# Patient Record
Sex: Female | Born: 1935 | Race: Black or African American | Hispanic: No | Smoking: Former smoker
Health system: Southern US, Community
[De-identification: ages and names within clinical notes are randomized; demographics above are authoritative.]

## PROBLEM LIST (undated history)

## (undated) DIAGNOSIS — I10 Essential (primary) hypertension: Secondary | ICD-10-CM

## (undated) DIAGNOSIS — E119 Type 2 diabetes mellitus without complications: Secondary | ICD-10-CM

## (undated) DIAGNOSIS — C50911 Malignant neoplasm of unspecified site of right female breast: Secondary | ICD-10-CM

## (undated) DIAGNOSIS — Z992 Dependence on renal dialysis: Secondary | ICD-10-CM

## (undated) DIAGNOSIS — E785 Hyperlipidemia, unspecified: Secondary | ICD-10-CM

## (undated) DIAGNOSIS — C50912 Malignant neoplasm of unspecified site of left female breast: Secondary | ICD-10-CM

## (undated) DIAGNOSIS — M1990 Unspecified osteoarthritis, unspecified site: Secondary | ICD-10-CM

## (undated) DIAGNOSIS — I999 Unspecified disorder of circulatory system: Secondary | ICD-10-CM

## (undated) DIAGNOSIS — D649 Anemia, unspecified: Secondary | ICD-10-CM

## (undated) DIAGNOSIS — B1920 Unspecified viral hepatitis C without hepatic coma: Secondary | ICD-10-CM

## (undated) DIAGNOSIS — N186 End stage renal disease: Secondary | ICD-10-CM

## (undated) DIAGNOSIS — I739 Peripheral vascular disease, unspecified: Secondary | ICD-10-CM

## (undated) HISTORY — PX: EVACUATION BREAST HEMATOMA: SHX1537

## (undated) HISTORY — PX: APPENDECTOMY: SHX54

## (undated) HISTORY — PX: MASTECTOMY: SHX3

## (undated) HISTORY — PX: UTERINE FIBROID SURGERY: SHX826

## (undated) HISTORY — PX: POSTERIOR FUSION CERVICAL SPINE: SUR628

## (undated) HISTORY — PX: DG AV DIALYSIS GRAFT DECLOT OR: HXRAD813

## (undated) HISTORY — DX: Unspecified disorder of circulatory system: I99.9

## (undated) HISTORY — PX: BREAST BIOPSY: SHX20

## (undated) HISTORY — PX: MEDIAN NERVE REPAIR: SHX2024

## (undated) HISTORY — PX: TONSILLECTOMY: SUR1361

## (undated) HISTORY — PX: ABDOMINAL HYSTERECTOMY: SHX81

## (undated) HISTORY — PX: CATARACT EXTRACTION: SUR2

## (undated) HISTORY — PX: BACK SURGERY: SHX140

## (undated) HISTORY — PX: TOE AMPUTATION: SHX809

---

## 1997-08-06 ENCOUNTER — Ambulatory Visit: Admission: RE | Admit: 1997-08-06 | Discharge: 1997-08-06 | Payer: Self-pay | Admitting: General Surgery

## 1997-08-20 ENCOUNTER — Encounter: Admission: RE | Admit: 1997-08-20 | Discharge: 1997-11-18 | Payer: Self-pay | Admitting: Internal Medicine

## 1997-11-26 ENCOUNTER — Encounter: Admission: RE | Admit: 1997-11-26 | Discharge: 1998-02-24 | Payer: Self-pay | Admitting: Internal Medicine

## 1998-03-04 ENCOUNTER — Encounter: Admission: RE | Admit: 1998-03-04 | Discharge: 1998-03-28 | Payer: Self-pay | Admitting: Internal Medicine

## 1998-04-07 ENCOUNTER — Encounter: Admission: RE | Admit: 1998-04-07 | Discharge: 1998-05-15 | Payer: Self-pay | Admitting: Internal Medicine

## 1998-08-13 ENCOUNTER — Encounter: Admission: RE | Admit: 1998-08-13 | Discharge: 1998-08-14 | Payer: Self-pay | Admitting: Internal Medicine

## 1998-11-07 ENCOUNTER — Encounter: Admission: RE | Admit: 1998-11-07 | Discharge: 1999-02-05 | Payer: Self-pay | Admitting: Internal Medicine

## 1999-02-10 ENCOUNTER — Encounter: Admission: RE | Admit: 1999-02-10 | Discharge: 1999-05-11 | Payer: Self-pay | Admitting: Internal Medicine

## 1999-05-12 ENCOUNTER — Encounter: Admission: RE | Admit: 1999-05-12 | Discharge: 1999-08-10 | Payer: Self-pay | Admitting: Internal Medicine

## 1999-08-18 ENCOUNTER — Encounter: Admission: RE | Admit: 1999-08-18 | Discharge: 1999-11-16 | Payer: Self-pay | Admitting: Internal Medicine

## 1999-11-25 ENCOUNTER — Encounter: Admission: RE | Admit: 1999-11-25 | Discharge: 2000-02-11 | Payer: Self-pay

## 2000-04-13 ENCOUNTER — Encounter: Admission: RE | Admit: 2000-04-13 | Discharge: 2000-07-12 | Payer: Self-pay | Admitting: Internal Medicine

## 2000-07-13 ENCOUNTER — Encounter: Admission: RE | Admit: 2000-07-13 | Discharge: 2000-10-04 | Payer: Self-pay | Admitting: Internal Medicine

## 2000-10-06 ENCOUNTER — Encounter: Admission: RE | Admit: 2000-10-06 | Discharge: 2001-01-02 | Payer: Self-pay | Admitting: Internal Medicine

## 2000-11-13 ENCOUNTER — Emergency Department (HOSPITAL_COMMUNITY): Admission: EM | Admit: 2000-11-13 | Discharge: 2000-11-13 | Payer: Self-pay | Admitting: Emergency Medicine

## 2001-02-01 ENCOUNTER — Encounter: Admission: RE | Admit: 2001-02-01 | Discharge: 2001-04-24 | Payer: Self-pay | Admitting: Internal Medicine

## 2001-05-13 ENCOUNTER — Encounter: Admission: RE | Admit: 2001-05-13 | Discharge: 2001-06-09 | Payer: Self-pay | Admitting: *Deleted

## 2001-06-14 ENCOUNTER — Encounter (HOSPITAL_BASED_OUTPATIENT_CLINIC_OR_DEPARTMENT_OTHER): Admission: RE | Admit: 2001-06-14 | Discharge: 2001-08-18 | Payer: Self-pay | Admitting: Internal Medicine

## 2001-09-18 ENCOUNTER — Encounter (HOSPITAL_BASED_OUTPATIENT_CLINIC_OR_DEPARTMENT_OTHER): Admission: RE | Admit: 2001-09-18 | Discharge: 2001-10-27 | Payer: Self-pay | Admitting: Internal Medicine

## 2001-12-13 ENCOUNTER — Encounter (HOSPITAL_BASED_OUTPATIENT_CLINIC_OR_DEPARTMENT_OTHER): Admission: RE | Admit: 2001-12-13 | Discharge: 2002-03-21 | Payer: Self-pay | Admitting: Internal Medicine

## 2002-04-18 ENCOUNTER — Encounter (HOSPITAL_BASED_OUTPATIENT_CLINIC_OR_DEPARTMENT_OTHER): Admission: RE | Admit: 2002-04-18 | Discharge: 2002-07-17 | Payer: Self-pay | Admitting: Internal Medicine

## 2002-07-17 ENCOUNTER — Ambulatory Visit (HOSPITAL_COMMUNITY): Admission: RE | Admit: 2002-07-17 | Discharge: 2002-07-17 | Payer: Self-pay | Admitting: Endocrinology

## 2002-07-17 ENCOUNTER — Encounter: Payer: Self-pay | Admitting: Endocrinology

## 2002-07-23 ENCOUNTER — Encounter: Payer: Self-pay | Admitting: Endocrinology

## 2002-07-23 ENCOUNTER — Encounter: Admission: RE | Admit: 2002-07-23 | Discharge: 2002-07-23 | Payer: Self-pay | Admitting: Endocrinology

## 2002-08-09 ENCOUNTER — Encounter (HOSPITAL_BASED_OUTPATIENT_CLINIC_OR_DEPARTMENT_OTHER): Admission: RE | Admit: 2002-08-09 | Discharge: 2002-10-17 | Payer: Self-pay | Admitting: Internal Medicine

## 2002-08-16 ENCOUNTER — Encounter: Payer: Self-pay | Admitting: Nephrology

## 2002-08-16 ENCOUNTER — Encounter: Admission: RE | Admit: 2002-08-16 | Discharge: 2002-08-16 | Payer: Self-pay | Admitting: Nephrology

## 2002-10-25 ENCOUNTER — Encounter (HOSPITAL_COMMUNITY): Admission: RE | Admit: 2002-10-25 | Discharge: 2003-01-23 | Payer: Self-pay | Admitting: Nephrology

## 2003-01-17 ENCOUNTER — Encounter (HOSPITAL_BASED_OUTPATIENT_CLINIC_OR_DEPARTMENT_OTHER): Admission: RE | Admit: 2003-01-17 | Discharge: 2003-04-17 | Payer: Self-pay | Admitting: Internal Medicine

## 2003-02-08 ENCOUNTER — Encounter (HOSPITAL_COMMUNITY): Admission: RE | Admit: 2003-02-08 | Discharge: 2003-05-09 | Payer: Self-pay | Admitting: Nephrology

## 2003-03-18 ENCOUNTER — Ambulatory Visit (HOSPITAL_COMMUNITY): Admission: RE | Admit: 2003-03-18 | Discharge: 2003-03-18 | Payer: Self-pay | Admitting: Internal Medicine

## 2003-03-27 ENCOUNTER — Ambulatory Visit (HOSPITAL_COMMUNITY): Admission: RE | Admit: 2003-03-27 | Discharge: 2003-03-27 | Payer: Self-pay | Admitting: Internal Medicine

## 2003-03-27 ENCOUNTER — Encounter (INDEPENDENT_AMBULATORY_CARE_PROVIDER_SITE_OTHER): Payer: Self-pay | Admitting: Specialist

## 2003-04-08 ENCOUNTER — Ambulatory Visit (HOSPITAL_COMMUNITY): Admission: RE | Admit: 2003-04-08 | Discharge: 2003-04-08 | Payer: Self-pay | Admitting: Vascular Surgery

## 2003-04-16 ENCOUNTER — Encounter (HOSPITAL_BASED_OUTPATIENT_CLINIC_OR_DEPARTMENT_OTHER): Admission: RE | Admit: 2003-04-16 | Discharge: 2003-07-15 | Payer: Self-pay | Admitting: Internal Medicine

## 2003-05-20 ENCOUNTER — Ambulatory Visit (HOSPITAL_COMMUNITY): Admission: RE | Admit: 2003-05-20 | Discharge: 2003-05-20 | Payer: Self-pay | Admitting: Vascular Surgery

## 2003-08-01 ENCOUNTER — Ambulatory Visit (HOSPITAL_COMMUNITY): Admission: RE | Admit: 2003-08-01 | Discharge: 2003-08-02 | Payer: Self-pay | Admitting: Ophthalmology

## 2003-08-14 ENCOUNTER — Encounter (HOSPITAL_BASED_OUTPATIENT_CLINIC_OR_DEPARTMENT_OTHER): Admission: RE | Admit: 2003-08-14 | Discharge: 2003-10-11 | Payer: Self-pay | Admitting: Internal Medicine

## 2003-11-07 ENCOUNTER — Encounter (HOSPITAL_BASED_OUTPATIENT_CLINIC_OR_DEPARTMENT_OTHER): Admission: RE | Admit: 2003-11-07 | Discharge: 2003-12-03 | Payer: Self-pay | Admitting: Internal Medicine

## 2004-01-08 ENCOUNTER — Encounter (HOSPITAL_BASED_OUTPATIENT_CLINIC_OR_DEPARTMENT_OTHER): Admission: RE | Admit: 2004-01-08 | Discharge: 2004-01-17 | Payer: Self-pay | Admitting: Internal Medicine

## 2004-03-19 ENCOUNTER — Ambulatory Visit (HOSPITAL_COMMUNITY): Admission: RE | Admit: 2004-03-19 | Discharge: 2004-03-19 | Payer: Self-pay | Admitting: Nephrology

## 2004-04-14 ENCOUNTER — Encounter (HOSPITAL_BASED_OUTPATIENT_CLINIC_OR_DEPARTMENT_OTHER): Admission: RE | Admit: 2004-04-14 | Discharge: 2004-05-29 | Payer: Self-pay | Admitting: Internal Medicine

## 2004-04-19 ENCOUNTER — Emergency Department (HOSPITAL_COMMUNITY): Admission: EM | Admit: 2004-04-19 | Discharge: 2004-04-19 | Payer: Self-pay | Admitting: Emergency Medicine

## 2004-04-30 ENCOUNTER — Emergency Department (HOSPITAL_COMMUNITY): Admission: EM | Admit: 2004-04-30 | Discharge: 2004-04-30 | Payer: Self-pay | Admitting: Emergency Medicine

## 2004-06-12 ENCOUNTER — Encounter (HOSPITAL_BASED_OUTPATIENT_CLINIC_OR_DEPARTMENT_OTHER): Admission: RE | Admit: 2004-06-12 | Discharge: 2004-09-10 | Payer: Self-pay | Admitting: Surgery

## 2004-08-03 ENCOUNTER — Observation Stay (HOSPITAL_COMMUNITY): Admission: EM | Admit: 2004-08-03 | Discharge: 2004-08-03 | Payer: Self-pay | Admitting: Emergency Medicine

## 2004-10-22 ENCOUNTER — Emergency Department (HOSPITAL_COMMUNITY): Admission: EM | Admit: 2004-10-22 | Discharge: 2004-10-22 | Payer: Self-pay | Admitting: Emergency Medicine

## 2005-02-18 ENCOUNTER — Ambulatory Visit (HOSPITAL_COMMUNITY): Admission: RE | Admit: 2005-02-18 | Discharge: 2005-02-18 | Payer: Self-pay | Admitting: Nephrology

## 2005-05-14 ENCOUNTER — Ambulatory Visit (HOSPITAL_COMMUNITY): Admission: RE | Admit: 2005-05-14 | Discharge: 2005-05-14 | Payer: Self-pay | Admitting: Vascular Surgery

## 2005-08-24 ENCOUNTER — Ambulatory Visit (HOSPITAL_COMMUNITY): Admission: RE | Admit: 2005-08-24 | Discharge: 2005-08-24 | Payer: Self-pay | Admitting: Nephrology

## 2005-10-13 ENCOUNTER — Emergency Department (HOSPITAL_COMMUNITY): Admission: EM | Admit: 2005-10-13 | Discharge: 2005-10-13 | Payer: Self-pay | Admitting: Emergency Medicine

## 2005-10-20 ENCOUNTER — Emergency Department (HOSPITAL_COMMUNITY): Admission: EM | Admit: 2005-10-20 | Discharge: 2005-10-20 | Payer: Self-pay | Admitting: Emergency Medicine

## 2005-10-21 ENCOUNTER — Encounter: Admission: RE | Admit: 2005-10-21 | Discharge: 2005-10-21 | Payer: Self-pay | Admitting: Endocrinology

## 2005-11-30 ENCOUNTER — Ambulatory Visit (HOSPITAL_COMMUNITY): Admission: RE | Admit: 2005-11-30 | Discharge: 2005-11-30 | Payer: Self-pay | Admitting: Vascular Surgery

## 2005-12-03 ENCOUNTER — Ambulatory Visit (HOSPITAL_COMMUNITY): Admission: RE | Admit: 2005-12-03 | Discharge: 2005-12-03 | Payer: Self-pay | Admitting: *Deleted

## 2006-01-13 ENCOUNTER — Ambulatory Visit (HOSPITAL_COMMUNITY): Admission: RE | Admit: 2006-01-13 | Discharge: 2006-01-13 | Payer: Self-pay | Admitting: Vascular Surgery

## 2006-02-22 ENCOUNTER — Emergency Department (HOSPITAL_COMMUNITY): Admission: EM | Admit: 2006-02-22 | Discharge: 2006-02-22 | Payer: Self-pay | Admitting: Emergency Medicine

## 2006-03-10 ENCOUNTER — Ambulatory Visit (HOSPITAL_COMMUNITY): Admission: RE | Admit: 2006-03-10 | Discharge: 2006-03-10 | Payer: Self-pay | Admitting: Vascular Surgery

## 2006-03-19 ENCOUNTER — Emergency Department (HOSPITAL_COMMUNITY): Admission: EM | Admit: 2006-03-19 | Discharge: 2006-03-19 | Payer: Self-pay | Admitting: Emergency Medicine

## 2006-03-21 ENCOUNTER — Encounter (INDEPENDENT_AMBULATORY_CARE_PROVIDER_SITE_OTHER): Payer: Self-pay | Admitting: Specialist

## 2006-03-21 ENCOUNTER — Ambulatory Visit (HOSPITAL_COMMUNITY): Admission: RE | Admit: 2006-03-21 | Discharge: 2006-03-21 | Payer: Self-pay | Admitting: *Deleted

## 2006-03-31 ENCOUNTER — Encounter: Admission: RE | Admit: 2006-03-31 | Discharge: 2006-03-31 | Payer: Self-pay | Admitting: Endocrinology

## 2006-04-07 ENCOUNTER — Inpatient Hospital Stay (HOSPITAL_COMMUNITY): Admission: AD | Admit: 2006-04-07 | Discharge: 2006-04-14 | Payer: Self-pay | Admitting: Neurosurgery

## 2006-04-14 ENCOUNTER — Inpatient Hospital Stay (HOSPITAL_COMMUNITY)
Admission: RE | Admit: 2006-04-14 | Discharge: 2006-05-06 | Payer: Self-pay | Admitting: Physical Medicine & Rehabilitation

## 2006-04-16 ENCOUNTER — Ambulatory Visit: Payer: Self-pay | Admitting: Physical Medicine & Rehabilitation

## 2006-07-26 ENCOUNTER — Ambulatory Visit (HOSPITAL_COMMUNITY): Admission: RE | Admit: 2006-07-26 | Discharge: 2006-07-26 | Payer: Self-pay | Admitting: Nephrology

## 2006-08-03 ENCOUNTER — Emergency Department (HOSPITAL_COMMUNITY): Admission: EM | Admit: 2006-08-03 | Discharge: 2006-08-03 | Payer: Self-pay | Admitting: Emergency Medicine

## 2006-09-27 ENCOUNTER — Ambulatory Visit (HOSPITAL_COMMUNITY): Admission: RE | Admit: 2006-09-27 | Discharge: 2006-09-27 | Payer: Self-pay | Admitting: Neurosurgery

## 2006-11-08 ENCOUNTER — Ambulatory Visit (HOSPITAL_COMMUNITY): Admission: RE | Admit: 2006-11-08 | Discharge: 2006-11-08 | Payer: Self-pay | Admitting: Nephrology

## 2006-12-26 ENCOUNTER — Ambulatory Visit (HOSPITAL_COMMUNITY): Admission: EM | Admit: 2006-12-26 | Discharge: 2006-12-26 | Payer: Self-pay | Admitting: Emergency Medicine

## 2007-01-16 ENCOUNTER — Ambulatory Visit (HOSPITAL_COMMUNITY): Admission: RE | Admit: 2007-01-16 | Discharge: 2007-01-16 | Payer: Self-pay | Admitting: Nephrology

## 2007-01-30 ENCOUNTER — Ambulatory Visit (HOSPITAL_COMMUNITY): Admission: RE | Admit: 2007-01-30 | Discharge: 2007-01-30 | Payer: Self-pay | Admitting: *Deleted

## 2007-01-30 ENCOUNTER — Ambulatory Visit: Payer: Self-pay | Admitting: Vascular Surgery

## 2007-02-13 ENCOUNTER — Ambulatory Visit (HOSPITAL_COMMUNITY): Admission: RE | Admit: 2007-02-13 | Discharge: 2007-02-13 | Payer: Self-pay | Admitting: Vascular Surgery

## 2007-02-17 ENCOUNTER — Encounter: Admission: RE | Admit: 2007-02-17 | Discharge: 2007-02-17 | Payer: Self-pay | Admitting: Gastroenterology

## 2007-02-20 ENCOUNTER — Ambulatory Visit (HOSPITAL_COMMUNITY): Admission: RE | Admit: 2007-02-20 | Discharge: 2007-02-20 | Payer: Self-pay | Admitting: *Deleted

## 2007-02-21 ENCOUNTER — Inpatient Hospital Stay (HOSPITAL_COMMUNITY): Admission: AD | Admit: 2007-02-21 | Discharge: 2007-02-24 | Payer: Self-pay | Admitting: Gastroenterology

## 2007-03-21 ENCOUNTER — Ambulatory Visit (HOSPITAL_COMMUNITY): Admission: RE | Admit: 2007-03-21 | Discharge: 2007-03-21 | Payer: Self-pay | Admitting: *Deleted

## 2007-04-13 ENCOUNTER — Ambulatory Visit: Payer: Self-pay | Admitting: *Deleted

## 2007-04-18 ENCOUNTER — Ambulatory Visit: Payer: Self-pay | Admitting: Vascular Surgery

## 2007-04-18 ENCOUNTER — Ambulatory Visit (HOSPITAL_COMMUNITY): Admission: RE | Admit: 2007-04-18 | Discharge: 2007-04-18 | Payer: Self-pay | Admitting: Surgery

## 2007-05-25 ENCOUNTER — Ambulatory Visit (HOSPITAL_COMMUNITY): Admission: RE | Admit: 2007-05-25 | Discharge: 2007-05-25 | Payer: Self-pay | Admitting: Vascular Surgery

## 2007-05-25 ENCOUNTER — Ambulatory Visit: Payer: Self-pay | Admitting: Vascular Surgery

## 2008-05-02 ENCOUNTER — Ambulatory Visit: Payer: Self-pay | Admitting: *Deleted

## 2008-05-07 ENCOUNTER — Ambulatory Visit: Payer: Self-pay | Admitting: Surgery

## 2008-05-07 ENCOUNTER — Ambulatory Visit (HOSPITAL_COMMUNITY): Admission: RE | Admit: 2008-05-07 | Discharge: 2008-05-07 | Payer: Self-pay | Admitting: Surgery

## 2008-10-24 ENCOUNTER — Ambulatory Visit (HOSPITAL_COMMUNITY): Admission: RE | Admit: 2008-10-24 | Discharge: 2008-10-24 | Payer: Self-pay | Admitting: Nephrology

## 2008-10-30 ENCOUNTER — Ambulatory Visit (HOSPITAL_COMMUNITY): Admission: RE | Admit: 2008-10-30 | Discharge: 2008-10-30 | Payer: Self-pay | Admitting: Nephrology

## 2009-01-16 ENCOUNTER — Ambulatory Visit (HOSPITAL_COMMUNITY): Admission: RE | Admit: 2009-01-16 | Discharge: 2009-01-16 | Payer: Self-pay | Admitting: Nephrology

## 2009-03-11 ENCOUNTER — Ambulatory Visit (HOSPITAL_COMMUNITY): Admission: RE | Admit: 2009-03-11 | Discharge: 2009-03-11 | Payer: Self-pay | Admitting: Nephrology

## 2009-03-18 ENCOUNTER — Ambulatory Visit: Payer: Self-pay | Admitting: Vascular Surgery

## 2009-03-20 ENCOUNTER — Ambulatory Visit: Payer: Self-pay | Admitting: Vascular Surgery

## 2009-03-20 ENCOUNTER — Ambulatory Visit (HOSPITAL_COMMUNITY): Admission: RE | Admit: 2009-03-20 | Discharge: 2009-03-20 | Payer: Self-pay | Admitting: Vascular Surgery

## 2009-03-20 HISTORY — PX: ARTERIOVENOUS GRAFT PLACEMENT: SUR1029

## 2009-04-03 ENCOUNTER — Ambulatory Visit: Payer: Self-pay | Admitting: Vascular Surgery

## 2009-04-08 ENCOUNTER — Ambulatory Visit (HOSPITAL_COMMUNITY): Admission: RE | Admit: 2009-04-08 | Discharge: 2009-04-08 | Payer: Self-pay | Admitting: Vascular Surgery

## 2009-04-17 ENCOUNTER — Ambulatory Visit: Payer: Self-pay | Admitting: Vascular Surgery

## 2009-04-18 HISTORY — PX: THROMBECTOMY AND REVISION OF ARTERIOVENTOUS (AV) GORETEX  GRAFT: SHX6120

## 2009-05-15 ENCOUNTER — Ambulatory Visit (HOSPITAL_COMMUNITY): Admission: RE | Admit: 2009-05-15 | Discharge: 2009-05-15 | Payer: Self-pay | Admitting: Vascular Surgery

## 2009-05-26 ENCOUNTER — Encounter (INDEPENDENT_AMBULATORY_CARE_PROVIDER_SITE_OTHER): Payer: Self-pay | Admitting: *Deleted

## 2009-08-26 ENCOUNTER — Ambulatory Visit (HOSPITAL_COMMUNITY): Admission: RE | Admit: 2009-08-26 | Discharge: 2009-08-26 | Payer: Self-pay | Admitting: Nephrology

## 2010-03-29 ENCOUNTER — Encounter: Payer: Self-pay | Admitting: Nephrology

## 2010-03-30 ENCOUNTER — Encounter: Payer: Self-pay | Admitting: Nephrology

## 2010-04-09 NOTE — Letter (Signed)
Summary: Colonoscopy Letter  [facility]  [address] [place], [place] [address]   Phone: [phone]  Fax: [phone]      May 26, 2009 MRN: [id number]   [facility] [address] [place], [place]  [address]   Dear [name],   According to your medical record, it is time for you to schedule a Colonoscopy. The American Cancer Society recommends this procedure as a method to detect early colon cancer. Patients with a family history of colon cancer, or a personal history of colon polyps or inflammatory bowel disease are at increased risk.  This letter has beeen generated based on the recommendations made at the time of your procedure. If you feel that in your particular situation this may no longer apply, please contact our office.  Please call our office at [phone] to schedule this appointment or to update your records at your earliest convenience.  Thank you for cooperating with [place] to provide you with the very best care possible.   Sincerely,  Hedwig Morton. Juanda Chance, M.D.  [facility] [phone]

## 2010-04-20 ENCOUNTER — Telehealth: Payer: Self-pay | Admitting: Internal Medicine

## 2010-05-19 NOTE — Progress Notes (Signed)
Summary: Schedule Colonoscopy  Phone Note Outgoing Call Call back at [facility] Phone [phone]   Call placed by: Harlow Mares CMA Duncan Dull),  April 20, 2010 10:35 AM Call placed to: Patient Summary of Call: pt is due for a colonoscopy, due to personal hx of colon polyps. No Answer  Initial call taken by: Harlow Mares CMA Duncan Dull),  April 20, 2010 10:35 AM  Follow-up for Phone Call        No Answer  A letter will be mailed to the patient.   Follow-up by: Harlow Mares CMA Duncan Dull),  May 13, 2010 11:48 AM

## 2010-05-23 LAB — POCT I-STAT 4, (NA,K, GLUC, HGB,HCT)
Glucose, Bld: 76 mg/dL (ref 70–99)
Potassium: 3.9 mEq/L (ref 3.5–5.1)
Sodium: 141 mEq/L (ref 135–145)

## 2010-05-23 LAB — GLUCOSE, CAPILLARY: Glucose-Capillary: 78 mg/dL (ref 70–99)

## 2010-05-23 LAB — POTASSIUM: Potassium: 4.3 mEq/L (ref 3.5–5.1)

## 2010-05-27 LAB — POCT I-STAT 4, (NA,K, GLUC, HGB,HCT)
Glucose, Bld: 87 mg/dL (ref 70–99)
HCT: 35 % — ABNORMAL LOW (ref 36.0–46.0)
Hemoglobin: 11.9 g/dL — ABNORMAL LOW (ref 12.0–15.0)

## 2010-05-27 LAB — GLUCOSE, CAPILLARY: Glucose-Capillary: 64 mg/dL — ABNORMAL LOW (ref 70–99)

## 2010-06-13 LAB — POTASSIUM: Potassium: 4.5 mEq/L (ref 3.5–5.1)

## 2010-06-18 LAB — POCT I-STAT, CHEM 8
BUN: 34 mg/dL — ABNORMAL HIGH (ref 6–23)
Calcium, Ion: 1.13 mmol/L (ref 1.12–1.32)
Chloride: 99 mEq/L (ref 96–112)

## 2010-07-06 ENCOUNTER — Other Ambulatory Visit (HOSPITAL_COMMUNITY): Payer: Self-pay | Admitting: Nephrology

## 2010-07-06 ENCOUNTER — Ambulatory Visit (HOSPITAL_COMMUNITY)
Admission: RE | Admit: 2010-07-06 | Discharge: 2010-07-06 | Disposition: A | Discharge status: Home / Self Care | Payer: Medicare Other | Source: Ambulatory Visit | Attending: Nephrology | Admitting: Nephrology

## 2010-07-06 DIAGNOSIS — Y832 Surgical operation with anastomosis, bypass or graft as the cause of abnormal reaction of the patient, or of later complication, without mention of misadventure at the time of the procedure: Secondary | ICD-10-CM | POA: Insufficient documentation

## 2010-07-06 DIAGNOSIS — N186 End stage renal disease: Secondary | ICD-10-CM | POA: Insufficient documentation

## 2010-07-06 DIAGNOSIS — T82898A Other specified complication of vascular prosthetic devices, implants and grafts, initial encounter: Secondary | ICD-10-CM | POA: Insufficient documentation

## 2010-07-06 LAB — POTASSIUM: Potassium: 3.7 mEq/L (ref 3.5–5.1)

## 2010-07-06 MED ORDER — IOHEXOL 300 MG/ML  SOLN
100.0000 mL | Freq: Once | INTRAMUSCULAR | Status: AC | PRN
  Administered 2010-07-06: 30 mL via INTRAVENOUS

## 2010-07-21 NOTE — Assessment & Plan Note (Signed)
OFFICE VISIT   JESTINA, STEPHANI  DOB:  [birth year]                                       04/13/2007  [id number]#:[id number]   Patient is an end-stage renal failure patient, dialysis Monday,  Wednesday, Friday.  Has had right upper extremity AV grafts placed  previously.  Most recent thrombectomy and revision, 02/13/07.  Right  upper arm graft failed.  Subsequent insertion of a Diatek catheter on  02/20/07.   Presents today for placement of new access.  There is a 2+ left radial  pulse.  Left arm is free of any skin lesions.   Will plan to place left arm AV graft access next week.  Patient is not  on any anticoagulants.   Balinda Quails, M.D.  Electronically Signed   PGH/MEDQ  D:  04/14/2007  T:  04/17/2007  Job:  [id number]

## 2010-07-21 NOTE — Op Note (Signed)
[name], [name]                ACCOUNT NO.:  [id number]   MEDICAL RECORD NO.:  [id number]          PATIENT TYPE:  AMB   LOCATION:  SDS                          FACILITY:  [facility]   PHYSICIAN:  VDurene Cal IV, MDDATE OF BIRTH:  1932/05/03   DATE OF PROCEDURE:  05/07/2008  DATE OF DISCHARGE:  05/07/2008                               OPERATIVE REPORT   PREOPERATIVE DIAGNOSIS:  Poor dialysis flow rates.   POSTOPERATIVE DIAGNOSIS:  Poor dialysis flow rates.   PROCEDURE PERFORMED:  1. Ultrasound access, left arteriovenous Gore-Tex graft.  2. Arteriovenous Gore-Tex dialysis graft study.  3. Catheter and dialysis graft.  4. Percutaneous transluminal venoplasty of the left brachial vein.   INDICATIONS:  This is a 77 year old female with a left forearm AV Gore-  Tex graft with high flow rate of dialysis who comes in for evaluation of  her graft.   PROCEDURE:  The patient was identified in the holding area and taken to  room 8.  She was placed supine on the table.  The left arm was prepped  and draped in standard sterile fashion.  A time-out was called.  Using  ultrasound, the left forearm AV Gore-Tex graft was accessed with a  micropuncture needle under ultrasound guidance.  An 0.018 mandrill wire  was advanced without resistance and a micropuncture sheath was placed.  Through the micropuncture sheath, contrast injections were performed.   FINDINGS:  The AV Gore-Tex graft was found to be widely patent.  The  arterial anastomosis was widely patent.  There are multiple  irregularities and high-grade stenosis within the outflow vein  approximately 6-7 cm in length.  There was no evidence of central  stenosis.   INTERVENTION:  Over a Bentson wire, a 7-French short sheath was placed.  A 7 x 4 Dorado balloon was used to perform venoplasty of the diseased  outflow vein.  Balloon was taken to 25 atmospheres.  Followup venogram  reveals significant improvement in the venous outflow  stenosis.  With  these findings, the decision made to terminate the procedure.  A Woggle  technique was used to close the access site.  The patient will be taken  to the holding area for sheath pull.   IMPRESSION:  1. No evidence of central venous stenosis.  2. No evidence of arterial anastomotic stenosis.  3. Diffusely diseased venous outflow tract approximately 6-7 cm in      length, successfully treated using a 7 x 4 Dorado balloon.           ______________________________  V. Charlena Cross, MD  Electronically Signed     VWB/MEDQ  D:  05/07/2008  T:  05/08/2008  Job:  [id number]   cc:   Balinda Quails, M.D.  [facility]

## 2010-07-21 NOTE — H&P (Signed)
NAMEELI, ADAMI NO.:  [id number]   MEDICAL RECORD NO.:  [id number]          PATIENT TYPE:  INP   LOCATION:  6712                         FACILITY:  [facility]   PHYSICIAN:  Graylin Shiver, M.D.   DATE OF BIRTH:  April 29, 1932   DATE OF ADMISSION:  02/21/2007  DATE OF DISCHARGE:                              HISTORY & PHYSICAL   REASON FOR ADMISSION:  The patient is a 77 year old female whom I saw in  the office on January 24, 2007, with complaints of diarrhea.  She had  been having diarrhea since January.  She was recently in the hospital  with a slipped disc the early part of this year and developed diarrhea  at that time.  She also has a sensation of not knowing when she is going  to have a bowel movement.  She states the stool just comes out.  She had  never had a colonoscopy and there is a family history of colon cancer.  I was going to do a colonoscopy on her, but when I brought her in for  colonoscopy, despite having gone through colonoscopy prep with [employer],  she was found to have impaction.  This was a large impaction and I could  not, obviously, do colonoscopy.   I talked to her and her family about the need to clean out the  impaction, but they determined that they could not do it.  Therefore, I  called several home health agencies, but they could not assist the  patient in cleaning out the impaction, stating they did not have the  manpower and stated that Medicare would not pay for them to do this.  We, therefore, decided to admit her to the hospital for cleaning out her  impaction.   PAST HISTORY:   ALLERGIES:  None known.   MEDICATIONS:  1. Metoprolol 25 mg p.o. b.i.d.  2. Norvasc 2.5 mg p.o. daily.  3. RenaVite one p.o. daily.  4. Fosrenol 1000 mg t.i.d.  5. Klonopin 0.5 mg p.o. daily.  6. Quinine sulfate 325 mg p.o. b.i.d. p.r.n. leg cramps.   PAST MEDICAL HISTORY:  Chronic renal failure, on dialysis.  She had  dialysis today.  History of  diabetes, hypertension.  SURGERIES:  Left  breast mastectomy.  Two toes amputated.  Cataract surgery.  Two  operations on stomach, not sure what those were, cancer, __________ .   SOCIAL HISTORY:  Does not smoke or drink alcohol.   FAMILY HISTORY:  Mother had colon cancer.   REVIEW OF SYSTEMS:  No fever, chills, complaints of chest pain,  shortness of breath, cough or sputum production.   PHYSICAL EXAM:  She is in no distress, not icteric.  VITALS:  Stable.  NECK:  Supple.  HEART:  Regular rhythm, no gallops or rubs.  LUNGS:  Clear.  ABDOMEN:  Bowel sounds.  Soft, nontender.  No hepatomegaly.  RECTAL:  Not repeated today.  Has previously been done and she had a  large fecal impaction.   Abdominal x-rays done on February 17, 2007, showed rectal diameter of 10  cm, filled  with stool that seemed scattered throughout the entire colon.   IMPRESSION:  Fecal impaction.   PLAN:  Patient is being admitted to the hospital for manual disimpaction  and soap suds enemas.  I think this will take a couple of days to clean  out her colon.  Unfortunately, her family was unable to deal with this  at home and I was unable to get a home health agency to help her out.  After we get her cleaned out, she will need a colonoscopy.           ______________________________  Graylin Shiver, M.D.     SFG/MEDQ  D:  02/21/2007  T:  02/22/2007  Job:  [id number]   cc:   Reather Littler, M.D.

## 2010-07-21 NOTE — Assessment & Plan Note (Signed)
OFFICE VISIT   [name], [name]  DOB:  April 30, 1932                                       03/18/2009  [id number]#:[id number]   The patient is a 77 year old female with type 1 diabetes mellitus and  end-stage renal disease on chronic hemodialysis being evaluated for  vascular access.  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  She is being evaluated for further graft at this  time.  She has had multiple failed accesses in the right upper extremity  but has never had a left upper extremity access graft.  She is right-  handed.   PAST MEDICAL HISTORY:  1. End-stage renal disease.  2. Diabetes.  3. Hyperlipidemia.  4. Hepatitis C.  5. Status post mastectomy.  6. Coronary artery disease stable.  7. Negative for COPD or stroke.   PREVIOUS SURGERY:  1. Includes cervical disk surgery 3 years ago.  2. Multiple vascular access procedures in the past.   FAMILY HISTORY:  Positive for diabetes in many family members, negative  for stroke or coronary artery disease.   SOCIAL HISTORY:  She is married, has two children, is retired.  Quit  smoking 40 years ago.  Does not use alcohol.   REVIEW OF SYSTEMS:  Negative for chest pain, dyspnea on exertion, PND,  orthopnea.  No bronchitis, wheezing.  Negative GI, GU.  All other  systems negative.   PHYSICAL EXAM:  Vital signs:  Blood pressure 156/82, heart rate 69,  respirations 14, temperature 98.  General:  She is a chronically ill-  appearing female in no apparent distress.  Alert and oriented x3.  She  is in a wheelchair.  Neurologic:  Exam reveals some diffuse weakness in  all extremities.  Neck:  Supple, 3+ carotid pulses palpable.  No bruits  are audible.  Chest:  Clear to auscultation.  Cardiovascular:  Regular  rhythm, no murmurs.  Abdomen:  Soft,  nontender with no masses.  Left  upper extremity reveals a thrombosed left forearm graft with no evidence  of previous access in the left upper arm.   I have reviewed her previous venogram performed in interventional  radiology in November and agree that she needs new access.  We will  schedule for her for left upper arm AV graft on Thursday 03/20/2009 and  hopefully this will provide satisfactory vascular access for this nice  lady.     Quita Skye Hart Rochester, M.D.  Electronically Signed   JDL/MEDQ  D:  03/18/2009  T:  03/19/2009  Job:  [id number]

## 2010-07-21 NOTE — Op Note (Signed)
[name], [name]                ACCOUNT NO.:  [id number]   MEDICAL RECORD NO.:  [id number]          PATIENT TYPE:  AMB   LOCATION:  SDS                          FACILITY:  [facility]   PHYSICIAN:  Charles E. Fields, MD  DATE OF BIRTH:  1932-08-06   DATE OF PROCEDURE:  02/13/2007  DATE OF DISCHARGE:  02/13/2007                               OPERATIVE REPORT   PROCEDURE:  Simple thrombectomy, right upper arm AV graft.   PREOPERATIVE DIAGNOSIS:  Thrombosed AV graft.   POSTOPERATIVE DIAGNOSIS:  Thrombosed AV graft.   ANESTHESIA:  Local with IV sedation.   ASSISTANT:  Gae Bon, P.A.-C.   OPERATIVE FINDINGS:  No narrowing of venous anastomosis.   OPERATIVE DETAILS:  After obtaining informed consent, the patient was  taken to the operating room.  The patient was placed in supine position  on the operating table.  After adequate sedation, the patient's entire  right upper extremity was prepped and draped in usual sterile fashion.  Local anesthesia was infiltrated at a preexisting longitudinal scar in  the right axilla.  Incision was reopened, carried down through  subcutaneous tissues, down to the level of graft.  Graft was dissected  free circumferentially.  Dissection was carried up onto the level of  venous anastomosis.  Vein was dissected free circumferentially.  Next,  the patient was given 5000 units of intravenous heparin.  A transverse  graftotomy was made just above the level of venous anastomosis.  Number  4 Fogarty catheter was used to thrombectomize the venous limb of graft.  All thrombotic material was removed, and there was good venous  backbleeding at this point.  The vein was then clamped proximally with a  fine bulldog clamp.  Next, the arterial limb of graft was  thrombectomized with #4 Fogarty catheter.  All thrombotic material was  removed.  Arterialized plug was retrieved.  There was excellent arterial  inflow.  Graft was clamped proximally with fistula clamp.   Next, the  graftotomy was repaired using a running 6-0 Prolene suture.  Just prior  to completion, anastomosis was forward bled, back bled and thoroughly  flushed.  Anastomosis was secured, clamps released.  There was a  palpable thrill at the proximal aspect of the graft immediately.  Next,  hemostasis was obtained.  Subcutaneous tissues reapproximated using  running 3-0 Vicryl suture.  Skin was closed with a 4-0 Vicryl  subcuticular stitch.  The patient tolerated the procedure well, and  there were no complications.  Instrument, sponge and needle counts were  correct at the end of the case.  The patient was taken to the recovery  room in stable condition.      Janetta Hora. Fields, MD  Electronically Signed     CEF/MEDQ  D:  02/13/2007  T:  02/14/2007  Job:  [id number]

## 2010-07-21 NOTE — Assessment & Plan Note (Signed)
OFFICE VISIT   [name], [name]  DOB:  May 04, 1932                                       05/02/2008  [id number]#:[id number]   The patient is a 77 year old end-stage renal failure patient referred by  Dr. Kathrene Bongo with poor flow noted in her left forearm AV graft.  Last revision of this graft was in August of 2009 with PTA of the venous  anastomosis.  This was initially inserted by Dr. Arbie Cookey in February of  2009.   Noted low flow in the graft.   BP 159/82, pulse 65 per minute.  Respirations 18 per minute.   Left forearm loop graft is patent.  1+ left radial pulse.  No arm  swelling.   Will arrange for a left arm shuntogram with possible PTA if lesion  identified.   Balinda Quails, M.D.  Electronically Signed   PGH/MEDQ  D:  05/02/2008  T:  05/03/2008  Job:  [id number]

## 2010-07-21 NOTE — Op Note (Signed)
[name], [name]                ACCOUNT NO.:  [id number]   MEDICAL RECORD NO.:  [id number]          PATIENT TYPE:  AMB   LOCATION:  SDS                          FACILITY:  [facility]   PHYSICIAN:  Balinda Quails, M.D.    DATE OF BIRTH:  1932-08-07   DATE OF PROCEDURE:  02/20/2007  DATE OF DISCHARGE:  02/20/2007                               OPERATIVE REPORT   SURGEON:  Denman George, MD   ASSISTANT:  Nurse.   ANESTHETIC:  Local with MAC.   ANESTHESIOLOGIST:  Dr. Katrinka Blazing.   PREOPERATIVE DIAGNOSES:  1. End-stage renal failure.  2. Clotted right upper arm arteriovenous graft.   POSTOPERATIVE DIAGNOSES:  1. End-stage renal failure.  2. Clotted right upper arm arteriovenous graft.   PROCEDURE:  Ultrasound-guided left internal jugular Diatek catheter.   OPERATIVE PROCEDURE:  The patient brought to the operating room in  stable condition.  Placed in supine position.  The neck prepped and  draped in sterile fashion.  Ultrasound of the neck carried out  bilaterally.  No significant right internal jugular vein was identified.  By ultrasound left internal jugular vein was present, this appeared  patent with normal respiratory variation and compressibility.   Skin and subcutaneous tissue left neck instilled with 1% Xylocaine.  An  18 gauge needle introduced in left internal jugular vein.  0.035 J-wire  passed through the needle into superior vena cava.  Site opened with 11  blade.  12, 14, and 16 dilators advanced over guidewire.  16 dilator and  tear-away sheath advanced over guidewire to the superior vena cava right  atrial junction.  The dilator removed.  The catheter then advanced over  the guidewire to the junction of the superior vena cava and right  atrium.  The tearaway sheath and guidewire were then removed.  Catheter  flushed with heparin saline solution.  The catheter then brought through  subcutaneous tunnel.  Divided, hub mechanism assembled, flushed with  heparin saline  solution and capped with heparin.   The patient tolerated procedure well.  No apparent complications.  Skin  was closed with interrupted 3-0 nylon suture.  Fixed to skin with  interrupted 2-0 silk suture.   The patient transferred to recovery room in stable condition.      Balinda Quails, M.D.  Electronically Signed     PGH/MEDQ  D:  02/20/2007  T:  02/21/2007  Job:  [id number]

## 2010-07-21 NOTE — Op Note (Signed)
NAMEBRIAWNA, CARVER NO.:  [id number]   MEDICAL RECORD NO.:  [id number]          PATIENT TYPE:  INP   LOCATION:  6712                         FACILITY:  [facility]   PHYSICIAN:  Danise Edge, M.D.   DATE OF BIRTH:  [birth year]   DATE OF PROCEDURE:  02/24/2007  DATE OF DISCHARGE:  02/24/2007                               OPERATIVE REPORT   PROCEDURE PERFORMED:  Screening colonoscopy.   PROCEDURE INDICATIONS:  Ms. Karuna Balducci is a 77 year old female born  [birth year].  Ms. Robison was admitted to the hospital by Dr. Herbert Moors on February 21, 2007, to treat a fecal impaction.  Ms. Tobey has  cleared her fecal impaction.  She underwent a colonic lavage prep last  night for a screening colonoscopy with polypectomy to prevent colon  cancer today.   Despite multiple attempts, IV access on Ms. Kniskern could not be  accomplished.  Colonoscopy is performed unsedated.   After obtaining informed consent, Ms. Milan was placed in the left  lateral decubitus position.  Anal inspection and digital rectal exam  were normal.  The Pentax pediatric colonoscope was introduced into the  rectum and advanced to the cecum as identified by a normal appearing  appendiceal orifice and ileocecal valve.  Colonic preparation for the  exam today was excellent.   Rectum normal.  I was unable to perform a retroflexed view of the distal  rectum.  Sigmoid colon and descending colon normal.  Splenic flexure normal.  Transverse colon normal.  Hepatic flexure normal.  Ascending colon normal.  Cecum and ileocecal valve normal.   ASSESSMENT:  Normal screening proctocolonoscopy to the cecum.  No  endoscopic evidence for the presence of colorectal neoplasia.  Fecal  impaction has cleared.           ______________________________  Danise Edge, M.D.     MJ/MEDQ  D:  02/24/2007  T:  02/25/2007  Job:  [id number]   cc:   Graylin Shiver, M.D.  Reather Littler, M.D.

## 2010-07-21 NOTE — Discharge Summary (Signed)
NAMEFARON, WHITELOCK                ACCOUNT NO.:  [id number]   MEDICAL RECORD NO.:  [id number]          PATIENT TYPE:  AMB   LOCATION:  SDS                          FACILITY:  [facility]   PHYSICIAN:  Danise Edge, M.D.   DATE OF BIRTH:  June 13, 1932   DATE OF ADMISSION:  04/18/2007  DATE OF DISCHARGE:  04/18/2007                               DISCHARGE SUMMARY   DISCHARGE DIAGNOSIS:  Resolved fecal impaction.   DISCHARGE MEDICATIONS:  1. Metoprolol 25 mg twice daily.  2. Norvasc 2.5 mg daily.  3. Renavite one daily.  4. Fosrenol 1000 mg three times daily.  5. Klonopin 0.5 mg daily.  6. Quinine sulfate 325 mg twice daily as needed for leg cramps.   HOSPITAL COURSE:  Ms. Devaeh Amadi is a 77 year old female born 07-21-1932.  Ms. Perko was admitted by Dr. Wandalee Ferdinand to [facility]  on February 21, 2007 to treat a fecal impaction.  After multiple days of  enemas and laxatives, her fecal impaction cleared.  On February 24, 2007, she underwent a normal screening Proctocolonoscopy to the cecum.  There was no endoscopic evidence for the presence of colorectal  neoplasia and colonoscopically her fecal impaction had cleared.   Ms. Azariya Freeman was discharged home in stable medical condition.           ______________________________  Danise Edge, M.D.     MJ/MEDQ  D:  05/10/2007  T:  05/10/2007  Job:  [id number]

## 2010-07-21 NOTE — Op Note (Signed)
NAMEMERLIE, NOGA                ACCOUNT NO.:  [id number]   MEDICAL RECORD NO.:  [id number]          PATIENT TYPE:  AMB   LOCATION:  SDS                          FACILITY:  [facility]   PHYSICIAN:  Hilda Lias, M.D.   DATE OF BIRTH:  [birth year]   DATE OF PROCEDURE:  09/27/2006  DATE OF DISCHARGE:                               OPERATIVE REPORT   PREOPERATIVE DIAGNOSIS:  Left carpal tunnel syndrome.   POSTOPERATIVE DIAGNOSIS:  Left carpal tunnel syndrome.   PROCEDURE:  Decompression of the left median nerve.   SURGEON:  Hilda Lias, M.D.   ANESTHESIA:  Local plus IV sedation.   CLINICAL HISTORY:  Ms. Naser underwent decompression of the cervical  spine but still has compression numbness.  Back dorsal test showed that  she has carpal tunnel syndrome.  Surgery was advised.  The risks were  explained to her including possibility for improving, need for further  surgery.   PROCEDURE IN DETAIL:  The patient was taken to the OR and the right hand  and arm were cleaned with DuraPrep.  Drapes were applied.  A field  dressing on the base of the thumb was made and incision following the  base of the thumb through the skin, volar ligament and through thick  carpal ligament proximal and distal of the ulnar aspect of the nerve was  done.  Decompression there was done and we had plenty of room for the  median nerve.  The nerve was a little pail.  Hemostasis was done with  bipolar.  At the end of the case, the patient was able to move all the  five fingers.  From then on, the area was irrigated and the wound was  closed with nylon.  She will be going to PACU, discharged once he is  stable and he will be seen by me in 48 hours.           ______________________________  Hilda Lias, M.D.     EB/MEDQ  D:  09/27/2006  T:  09/27/2006  Job:  [id number]

## 2010-07-21 NOTE — Assessment & Plan Note (Signed)
OFFICE VISIT   [name], Heather V  DOB:  March 18, 1932                                       04/03/2009  ZOXWR#:[id number]   I saw the patient in the office today to evaluate her for a steal  syndrome in the left upper extremity.  This is a pleasant 77 year old  woman who has had previous access attempts in the right arm and most  recently had a left upper arm graft placed by Dr. Hart Rochester with a 6 mm  Gore-Tex graft.  Of note, she had surgery on her neck 3 years ago and  states that ever since her neck surgery both hands have been cold.  She  noted that after her graft was placed in the left arm 2 weeks ago she  had worsening coldness in the left hand and some occasional pain.  These  symptoms have been steady over the last 2 weeks.  She was sent for  evaluation for steal syndrome by Dr. Kathrene Bongo.   PHYSICAL EXAMINATION:  This is a pleasant 77 year old woman who appears  her stated age.  Blood pressure is 152/73, heart rate is 63, temperature  97.9.  She has an excellent thrill in her upper arm graft in the left  arm.  I cannot palpate a radial pulse on the left.  She has a palpable  radial pulse on the right.  She has monophasic Doppler signals in the  radial and ulna position on the left.  I cannot get a palmar arch signal  with the Doppler.   She had a formal steal study in our office today which showed the  pressure in the right arm was 152.  Pressure in the left arm was 78 with  the graft open and with compression the graft improved to 148 mmHg.   Given the worsening symptoms in the left hand with evidence of steal  syndrome and the findings on the steal study I think it is worth  attempting to revise the graft with an interposition segment of tapered  4 7 graft to try to limit flow into the graft and improve her steal  syndrome but yet still maintain graft patency.  The next logical place  for access would be a thigh graft otherwise.  I have explained  that the  risk associated with revision of the graft are persistent steal  syndromes despite revision or limiting flow to the graft enough that the  graft clots.  If she still had symptoms after revision of the gram she  would likely require ligation of the graft.  She is agreeable to attempt  this and she is a Monday, Wednesday, Friday dialysis and so will  schedule this on a Tuesday.  She has been scheduled for 04/08/2009.  Hopefully this will help with her symptoms and maintain graft patency.     Di Kindle. Edilia Bo, M.D.  Electronically Signed   CSD/MEDQ  D:  04/03/2009  T:  04/04/2009  Job:  [id number]   cc:   Cecille Aver, M.D.

## 2010-07-21 NOTE — Op Note (Signed)
[name], [name]                ACCOUNT NO.:  [id number]   MEDICAL RECORD NO.:  [id number]          PATIENT TYPE:  AMB   LOCATION:  SDS                          FACILITY:  [facility]   PHYSICIAN:  Larina Earthly, M.D.    DATE OF BIRTH:  Aug 06, 1932   DATE OF PROCEDURE:  04/18/2007  DATE OF DISCHARGE:  04/18/2007                               OPERATIVE REPORT   PREOPERATIVE DIAGNOSIS:  End-stage renal disease.   POSTOPERATIVE DIAGNOSIS:  End-stage renal disease.   PROCEDURE:  Left forearm loop AV Gore-Tex graft with a 4 x 7 Gore-Tex  graft.   SURGEON:  Larina Earthly, M.D.   ASSISTANT:  Cyndy Freeze, PA   ANESTHESIA:  LMA.   COMPLICATIONS:  None.   DISPOSITION:  To recovery room stable.   DESCRIPTION OF PROCEDURE:  The patient was taken to the operating room  and placed in the supine position where the area of the left arm was  prepped and draped in the usual sterile fashion.  Incision was made  after using ultrasound to identify the basilic vein which was the best  vein at the level of the antecubital space.  Incision was made over the  basilic vein just above the elbow.  The vein was of excellent caliber at  this position.   A separate incision was made at the antecubital space over the brachial  pulse and carried down next to the brachial artery which was relatively  small in size.  A separate incision was made over the distal forearm and  a loop configuration tunnel was created.  A 4 x 7 mm taper stretch graft  was brought through the tunnel.  The brachial artery was occluded  proximally and distally and was opened with an 11 blade, extended  longitudinally with Potts scissors.  The graft was slightly spatulated  and sewn end-to-side to the artery with a running 6-0 Prolene suture.  A  small arteriotomy was created.  The graft clamps were removed and the  graft was flushed with heparinized saline and reoccluded.  Next the  basilic vein was occluded proximally and  distally, opened with 1 blade  and extended longitudinally with Potts scissors.  The graft was  spatulated and sewn end-to-side to the basilic vein with a running 6-0  Prolene suture.  Clamps were removed and excellent thrill was noted.  The wound was irrigated with saline, hemostasis achieved with  electrocautery.  Wound was closed with 3-0 in the subcutaneous and  subcuticular tissue.  Benzoin and Steri-Strips were applied.     Larina Earthly, M.D.  Electronically Signed    TFE/MEDQ  D:  04/18/2007  T:  04/19/2007  Job:  [id number]

## 2010-07-21 NOTE — Procedures (Signed)
VASCULAR LAB EXAM   INDICATION:  Evaluate AV fistula steal.   HISTORY:  Diabetes:  Yes.  Cardiac:  No.  Hypertension:  Yes.   EXAM:  Compression of left AV fistula.   IMPRESSION:  The pressure in the right arm is 152.  The pressure in the  left arm before compression is 78 and after the compression is 148.  This suggests arteriovenous fistula steal due to the significant change  in pressure after compression of the left arteriovenous fistula.       ___________________________________________  Di Kindle. Edilia Bo, M.D.   CB/MEDQ  D:  04/03/2009  T:  04/03/2009  Job:  [id number]

## 2010-07-21 NOTE — Op Note (Signed)
NAMEDENIESHA, STENGLEIN                ACCOUNT NO.:  [id number]   MEDICAL RECORD NO.:  [id number]          PATIENT TYPE:  AMB   LOCATION:  SDS                          FACILITY:  [facility]   PHYSICIAN:  Larina Earthly, M.D.    DATE OF BIRTH:  August 27, 1932   DATE OF PROCEDURE:  01/30/2007  DATE OF DISCHARGE:  01/30/2007                               OPERATIVE REPORT   PREOPERATIVE DIAGNOSIS:  Endstage renal disease with occluded right  upper arm arteriovenous Gore-Tex graft.   POSTOPERATIVE DIAGNOSIS:  Endstage renal disease with occluded right  upper arm arteriovenous Gore-Tex graft.   PROCEDURE:  Thrombectomy and interposition of arm graft revision to  higher axillary vein of right upper arm, arteriovenous Gore-Tex graft.   SURGEON:  Larina Earthly, M.D.   ASSISTANT:  Nurse.   ANESTHESIA:  MAC.   COMPLICATIONS:  None.   DISPOSITION:  Recovery room, stable.   PROCEDURE IN DETAIL:  The patient was taken to the operating room,  placed in supine position.  The area of right arm was prepped and draped  in usual sterile fashion.   Incision was made at the axilla, using local anesthesia and carried down  to isolate the prior graft vein anastomosis.  The vein was exposed  further proximally, above the anastomosis.  This was of good caliber.  The graft was open at the old anastomosis and the vein itself was  thrombectomized.  It was widely patent.  A 5 dilator passed without  resistance to the axillary vein.  This was flushed with heparinized  saline and reoccluded.   Next, the graft was thrombectomized.  The arterialized plug was removed  and excellent inflow was encountered.  This was flushed with heparinized  saline and cleared as well.  The old anastomosis was excised.  A new  interposition graft of 7 mm Gore-Tex was brought onto the field and was  spatulated and sewn end-to-end to the axillary vein with a running 6-0  Prolene suture.  This anastomosis was tested and found to be  adequate  and the graft was reoccluded.   Next, the graft was cut to the appropriate length and was sewn end-to-  end to the old graft with a running 6-0 Prolene suture.  Clamps were  removed and excellent thrill was noted.  The wounds were irrigated with  saline and hemostasis with electrocautery.  Wounds closed with 3-0  Vicryl in the subcutaneous and subcuticular tissue.  Benzoin and Steri-  Strips were applied.      Larina Earthly, M.D.  Electronically Signed     TFE/MEDQ  D:  01/30/2007  T:  01/31/2007  Job:  [id number]

## 2010-07-24 NOTE — H&P (Signed)
NAMEROSELYNNE, [name] NO.:  [id number]   MEDICAL RECORD NO.:  [id number]           PATIENT TYPE:   LOCATION:                                 FACILITY:   PHYSICIAN:  Ellwood Dense, M.D.   DATE OF BIRTH:  08-Nov-1932   DATE OF ADMISSION:  DATE OF DISCHARGE:                              HISTORY & PHYSICAL   PRIMARY CARE PHYSICIAN:  Maui Kidney   ENDOCRINOLOGY:  Reather Littler, M.D.   NEUROSURGEON:  Hilda Lias, M.D.   HISTORY OF PRESENT ILLNESS:  The patient is a 77 year old African-  American female with a history of end-stage renal disease on  hemodialysis for two years duration. The patient was admitted April 06, 2006 with upper extremity weakness and poor gait ability. X-rays and  imaging showed C3-4 and C4-5 stenosis with myelopathy.   The patient underwent anterior cervical diskectomy and fusion C3-4, C4-5  April 06, 2006, performed by Dr. Jeral Fruit. Cervical collar was put on  the patient. She remains on dialysis every Monday, Wednesday and Friday.  Followup cranial CT of the neck April 11, 2006, with mild  postoperative edema without hematoma. She has had some bouts of  hypoglycemia and insulin has been held with recent blood sugars ranging  from 96-118.   The patient was evaluated by the rehabilitation physicians and felt to  be an appropriate candidate for inpatient rehabilitation.   REVIEW OF SYSTEMS:  Positive for weakness, numbness and reflux.   PAST MEDICAL HISTORY:  1. End-stage renal disease on hemodialysis every Monday, Wednesday and      Friday with history of multiple arteriovenous grafts.  2. Hypertension.  3. Insulin-dependent diabetes mellitus.  4. Prior appendectomy.  5. Right cataract surgery.  6. Right second toe amputation.   FAMILY HISTORY:  Positive for coronary artery disease and diabetes  mellitus.   SOCIAL HISTORY:  The patient lives with her husband and her husband  works. The family in the area can assist as  needed. She does not use  alcohol or tobacco.   FUNCTIONAL HISTORY PRIOR TO ADMISSION:  Independent using a walker but  mostly sedentary.   ALLERGIES:  No known drug allergies.   MEDICATIONS PRIOR TO ADMISSION:  1. Darvocet p.r.n.  2. Metoprolol 100 mg daily.  3. Nephro-Vite one tablet daily.  4. PhosLo 667 mg 3 tablets t.i.d.  5. NovoLog insulin 6 units before lunch.  6. Novolin N insulin 24 units before lunch.  7. Novolin N 26 units breakfast.  8. Quinine sulfate b.i.d.   LABORATORY:  Recent hemoglobin was 9.4, hematocrit of 28.1, platelet  count 159,000 and white count of 10.3. Recent sodium was 133, potassium  4.5, chloride 98, CO2 26, BUN 38 and creatinine 5.4.   PHYSICAL EXAMINATION:  GENERAL: Elderly, frail female lying in bed with  cervical collar in place in mild acute discomfort.  VITAL SIGNS: Blood pressure 120/70 with pulse 78, respiratory rate 18,  temperature 98.  HEENT:  Normocephalic, nontraumatic.  CARDIOVASCULAR: Regular rate and rhythm. S1 and S2 without murmurs.  ABDOMEN: Soft and nontender with positive bowel sounds.  LUNGS:  Clear to auscultation bilaterally.  NECK:  Supple with collar in place.  Staples were intact without  drainage.  EXTREMITIES:  Bilateral upper extremity exam showed approximately 3-/5  strength in the left upper and right upper extremities.  Sensation was  intact to light touch throughout the bilateral upper extremities. Bulk  and tone were normal. Lower extremity exam showed hip flexion and knee  extension and ankle dorsiflexion at 3 to 3+ over 5. Sensation was intact  to light touch in bilateral lower extremities.   IMPRESSION:  1. Status post anterior cervical diskectomy C3-C5 for cervical      stenosis with myelopathy.  2. End-stage renal disease on hemodialysis every Monday, Wednesday and      Friday.  3. Insulin-dependent diabetes mellitus.  4. Hypertension.   Presently the patient has deficits in ADLs, transfers and  ambulation  secondary to the above noted anterior cervical diskectomy and fusion for  cervical stenosis with myelopathy. She still has significant weakness in  all four limbs at the present time.   PLAN:  1. Admit to the rehabilitation unit for daily therapies to include PT      for range of motion, strengthening, bed mobility, transfers,      pregait training, gait training and equipment evaluation.  2. OT for range of motion, strengthening, ADLs, cognitive/perceptual      training, splinting and equipment evaluation.  3. Rehab nursing for skin care and wound care along with bowel and      bladder training and medication administration.  4. Case management to assess home environment, assist with discharge      planning and arrange for appropriate followup care.  5. Social worker to assess family/social support, counseling the      patient's family regarding disability issues and assist in      discharge planning.  6. Check admission labs with regular hemodialysis orders.  7. Monitor hypertension on Norvasc 10 mg p.o. q.h.s. along with      Lopressor 100 mg p.o. b.i.d.  8. Nephro-Vite one tablet p.o. daily.  9. Continue quinine sulfate 324 mg p.o. b.i.d. for restless leg      syndrome.  10.Continue Aranesp 25 mcg IV every Friday for anemia related to end-      stage renal disease.  11.Cervical collar at all times.  12.Continue Mepro 237 mL p.o. t.i.d.  13.Darvocet-N 100 1-2 tablets p.o. q.6h. p.r.n. for pain.  14.Dulcolax suppository per rectum daily p.r.n.  15.Protonix 40 mg daily for GI prophylaxis.  16.Add Lovenox 40 mg subcu daily for DVT prophylaxis.  17.Sliding-scale NovoLog insulin at present with initiation of home      insulin schedule as appropriate.  18.60/70-2-2 renal diet with 1200 cc fluid restriction and high      carbohydrate modified diet.   PROGNOSIS:  Poor/fair.   ESTIMATED LENGTH OF STAY:  Fifteen to 25 days.  GOALS:  Stand-by assist ADLs, transfers and  ambulation.           ______________________________  Ellwood Dense, M.D.     DC/MEDQ  D:  04/14/2006  T:  04/14/2006  Job:  [id number]

## 2010-07-24 NOTE — Op Note (Signed)
NAMESHERRIAN, NUNNELLEY                ACCOUNT NO.:  [id number]   MEDICAL RECORD NO.:  [id number]          PATIENT TYPE:  AMB   LOCATION:  SDS                          FACILITY:  [facility]   PHYSICIAN:  Charles E. Fields, MD  DATE OF BIRTH:  30-Nov-1932   DATE OF PROCEDURE:  12/03/2005  DATE OF DISCHARGE:  12/03/2005                                 OPERATIVE REPORT   PROCEDURE:  1. Ultrasound of neck  2. Insertion of right internal jugular vein Diatek catheter.   PREOPERATIVE DIAGNOSIS:  Renal failure.   POSTOPERATIVE DIAGNOSIS:  Renal failure.   ANESTHESIA:  Local with IV sedation.   OPERATIVE FINDINGS:  A 23-cm Diatek catheter right internal jugular vein.   OPERATIVE DETAILS:  After obtaining informed consent, the patient was taken  to the operating.  The patient was placed in the supine position on the  operating table.  After adequate sedation, the patient's neck was inspected  with aSite-Rite ultrasound.  Right internal jugular vein was found to be  widely patent with normal respiratory variability and compressibility.  Next, the patient's entire neck and chest were prepped and draped in the  usual sterile fashion.  Local anesthesia was infiltrated over the right  internal jugular vein.  A small finder needle was used to localize the right  internal jugular vein.  A larger introducer needle was then used to  cannulate the right internal jugular vein and a 0.035 J-tip guide wire  threaded through the right internal jugular vein down into the inferior vena  cava under fluoroscopic guidance.   Next, sequential 12, 14 and 16-French dilators with peel-away sheath were  placed over the guidewire in the right atrium.  Guidewire and dilator was  removed and a 23-cm Diatek catheter placed through the peel-away sheath into  the right atrium.  The peel-away sheath was then removed.  The catheter was  then tunneled subcutaneously, cut to length and the hub attached.  Catheter  was noted to  flush and draw easily.  Catheter was inspected under  fluoroscopy and found with its tip to be in the right atrium.  No kinks  throughout its course.  The catheter was then sutured to skin with nylon  sutures.  The neck insertion site was closed with a Vicryl stitch.  The  catheter was then loaded with concentrated heparin solution.  The patient  tolerated the procedure well and there were no complications.  Instrument,  sponge and needle counts were correct at the end of the case.  The patient  taken to the recovery room in stable condition.      Janetta Hora. Fields, MD  Electronically Signed     CEF/MEDQ  D:  12/03/2005  T:  12/06/2005  Job:  [id number]

## 2010-07-24 NOTE — Assessment & Plan Note (Signed)
OFFICE VISIT   [name], Heather V  DOB:  [birth year]                                       04/18/2009  ZOXWR#:[id number]   I saw the patient in the office today for followup after a recent  revision of her left upper arm AV graft for steal syndrome.  This was  done on 04/08/2009.  She had had problems with steal syndrome in the  left arm and had originally a 6 mm graft placed.  I elected to revise  this with an interposition segment of a 4-7 mm tapered graft.  This was  done on 04/08/2009.  She comes in for her first outpatient visit.  Overall she has done well.  She states that her symptoms in her left arm  have resolved and her graft continues to function well.  They have not  yet begun to dialyze her with her graft.  She is using her catheter.  She has 1 more week before they can use her graft.   PHYSICAL EXAMINATION:  On examination blood pressure is 178/77, heart  rate is 65.  Temperature is 98.  The graft has an excellent thrill and  bruit.  She has a palpable but diminished left radial pulse.  The  incision is healing nicely.   I think this graft should be ready to use in a week and then we can work  on getting her catheter out.  We will see her back p.r.n.     Di Kindle. Edilia Bo, M.D.  Electronically Signed   CSD/MEDQ  D:  04/17/2009  T:  04/18/2009  Job:  [id number]

## 2010-07-24 NOTE — Discharge Summary (Signed)
[name], [name]                ACCOUNT NO.:  [id number]   MEDICAL RECORD NO.:  [id number]          PATIENT TYPE:  IPS   LOCATION:  4005                         FACILITY:  [facility]   PHYSICIAN:  Hilda Lias, M.D.   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   CLINICAL HISTORY:  The patient is a 77 year old lady, on hemodialysis  who was admitted after she had a three week history of gradual onset of  weakness of the upper extremity.  X-ray shows severe stenosis at the  levels of C3-4 and C4-5 and surgery was advised.   LABORATORY DATA:  Normal.   HOSPITAL COURSE:  The patient was taken to surgery and decompression  with fusion at C3-4 and C4-5 was done.  The patient was stable.  She  improved to the point that she was able to move both upper extremities  after surgery.  The patient is going to require level 3 rehabilitation  and she is going to be transferred to the Rehab unit on February 8th.   CONDITION ON DISCHARGE:  Improved.   MEDICATIONS:  Continue with same.   DIET:  Continue with same.   ACTIVITY:  As per Rehab.   FOLLOW UP:  I will follow [name] while she is in the hospital and  later on in my office.           ______________________________  Hilda Lias, M.D.     EB/MEDQ  D:  05/17/2006  T:  05/19/2006  Job:  [id number]

## 2010-07-24 NOTE — Op Note (Signed)
NAMEMICHAELENE, DUTAN                ACCOUNT NO.:  [id number]   MEDICAL RECORD NO.:  [id number]          PATIENT TYPE:  AMB   LOCATION:  SDS                          FACILITY:  [facility]   PHYSICIAN:  Charles E. Fields, MD  DATE OF BIRTH:  April 25, 1932   DATE OF PROCEDURE:  01/13/2006  DATE OF DISCHARGE:                               OPERATIVE REPORT   PROCEDURE:  Right upper arm arteriovenous graft.   PREOPERATIVE DIAGNOSIS:  End-stage renal disease.   POSTOPERATIVE DIAGNOSIS:  End-stage renal disease.   ANESTHESIA:  Local with IV sedation.   ASSISTANT:  Rowe Clack, P.A.-C.   OPERATIVE FINDINGS:  A 6-mm PTFE end-to-end to axillary vein.   OPERATIVE DETAILS:  After obtaining informed consent, the patient was  taken to the operating room.  The patient was placed in supine position  on the operating table.  After adequate sedation, the patient's entire  right upper extremity was prepped and draped in the usual sterile  fashion.  Local anesthesia was infiltrated at an area just above the  antecubital crease.  A longitudinal incision was made in this location  and carried down through the subcutaneous tissues down to the level of  the brachial artery.  The brachial  artery was dissected free  circumferentially.  It was controlled proximally and distally with  vessel loops.  Next local anesthesia was infiltrated up near the axilla.  An additional longitudinal incision was made in this location and  carried down through subcutaneous tissues down to level of the  brachial/axillary vein.  This was dissected free circumferentially.  Next a subcutaneous tunnel was created connecting the upper arm to the  lower arm incision.  A 6-mm PTFE graft was brought through this  subcutaneous tunnel.  The patient was then given 5000 units of  intravenous heparin.  Vessel loops were pulled taut on the artery.  The  graft was slightly beveled.  A longitudinal arteriotomy was made and the  graft sewn end  of graft to side of artery using a running 6-0 Prolene  suture.  Just prior completion of the anastomosis this was forebled,  backbled and thoroughly flushed.  The anastomosis was secured, the clamp  was moved up onto the graft and flow was restored through the brachial  artery.  Next the graft was pulled taut to length in the upper arm.  The  distal deep brachial vein was ligated with a 2-0 silk tie and the vein  transected.  The vein was controlled proximally with a fine bulldog  clamp.  The vein was spatulated and elevated up to the level of the  graft.  The graft was cut at an appropriate length and beveled.  It was  then sewn end of graft to end of vein using a running 6-0 Prolene  suture.  Just prior to completion of the anastomosis this was forebled,  backbled and thoroughly flushed.  The anastomosis was secured, clamps  were released.  There was a palpable thrill in the graft immediately.  The patient had a weak radial Doppler signal, which augmented  significantly with  compression of the graft after opening the graft.  Next hemostasis was obtained.  Subcutaneous tissues of both incisions  were reapproximated using running 3-0 Vicryl suture.  The skin of  both incisions was closed with a 4-0 Vicryl subcuticular stitch.  The  patient tolerated the procedure well and there were no complications.  The instrument, sponge and needle count was correct at the end of the  case.  The patient taken to the recovery room in stable condition.      Janetta Hora. Fields, MD  Electronically Signed     CEF/MEDQ  D:  01/13/2006  T:  01/14/2006  Job:  [id number]

## 2010-07-24 NOTE — Op Note (Signed)
NAMEEDMONIA, GONSER                ACCOUNT NO.:  [id number]   MEDICAL RECORD NO.:  [id number]          PATIENT TYPE:  [name]   LOCATION:  3114                         FACILITY:  [facility]   PHYSICIAN:  Hilda Lias, M.D.   DATE OF BIRTH:  02/02/1933   DATE OF PROCEDURE:  04/06/2006  DATE OF DISCHARGE:                               OPERATIVE REPORT   PREOPERATIVE DIAGNOSIS:  C3-C4, C4-C5 herniated disk with quadriparesis.   POSTOPERATIVE DIAGNOSIS:  C3-C4, C4-C5 herniated disk with  quadriparesis.   PROCEDURE:  1. Anterior C3-C4, C4-C5 decompression of the spinal cord.  2. Foraminotomy.  3. Interbody fusion with allograft plate.  4. Microscopy.   SURGEON:  Hilda Lias, M.D.   ASSISTANT:  Dr. Franky Macho.   CLINICAL HISTORY:  Ms. Oats is a 77 year old lady who had been on  hemodialysis with diabetes mellitus, who about 3 weeks ago developed the  sudden onset of weakness in the upper and lower extremities.  By the  time I saw her yesterday in my office, she came with her husband in a  wheelchair.  She has weakness proximal and she had difficulty walking.  An MRI shows quite severe stenosis at the level of C3-C4 secondary to  herniated disk and spondylolisthesis and herniated disk at L4-5.  She  has changes below 5.  Surgery was advised and the risks were explained  to both of them.   PROCEDURE:  The patient was taken to the OR and after intubation, which  was done in a neutral position, the left side of the neck was cleaned  with DuraPrep.  A longitudinal incision through the skin, subcutaneous  tissue, and platysma was carried out.  X-ray showed that we were at the  level of C3-C4.  From then on, we removed large anterior osteophyte at 3-  4 and 4-5.  We brought the microscope into the area.  We opened the  anterior ligament of C3-C4 and after diskectomy, we found that there was  almost no posterior ligament.  The disk was going into the spinal cord  with displacement of spinal  cord.  Removal of the disk, as well as  decompression of the spinal cord and the nerve was achieved.  The same  procedure was done at the level of C4-C5, which we found not only  herniated disk, but quite a bit of spondylosis.  At the end, we have  good decompression at those two levels.  The endplates were drained and  two pieces of allograft of 7-mm were inserted, followed by a plate using  6 screws.  Lateral C-spine showed good position of bone graft.  The  patient had been on hemodialysis, and although we have achieved  hemostasis, nevertheless, we left a large drain in the perivertebral  area.  The skin was closed with Vicryl and Steri-Strips.  She is going  to go to the neuro intensive care unit.  We are going to get ahold of  the rehab service to help her with her rehabilitation.           ______________________________  Hilda Lias, M.D.  EB/MEDQ  D:  04/06/2006  T:  04/07/2006  Job:  [id number]   cc:   Reather Littler, M.D.

## 2010-07-24 NOTE — Op Note (Signed)
NAME:  [name], [name]                          ACCOUNT NO.:  [id number]   MEDICAL RECORD NO.:  [id number]                   PATIENT TYPE:  OIB   LOCATION:  5511                                 FACILITY:  [facility]   PHYSICIAN:  Beulah Gandy. Ashley Royalty, M.D.              DATE OF BIRTH:  [birth year]   DATE OF PROCEDURE:  08/01/2003  DATE OF DISCHARGE:  08/02/2003                                 OPERATIVE REPORT   ADMISSION DIAGNOSES:  Proliferative diabetic retinopathy, hypertensive  retinopathy, vitreous hemorrhage, and posterior vitreous membranes, right  eye.   PROCEDURES:  Pars plana vitrectomy with membrane peel, retinal  photocoagulation, intravitreal Kenalog injection in the right eye.   SURGEON:  Beulah Gandy. Ashley Royalty, M.D.   ASSISTANT:  Bryan Lemma. Lundquist, P.A.   ANESTHESIA:  General.   DETAILS:  Usual prep and drape.  Peritomies at 8, 10, and 2 o'clock.  The  angled infusion port was anchored into place at 8 o'clock.  The lighted pick  and the cutter were placed at 10 and 2 o'clock, respectively.  Provisc was  placed on the corneal surface and the BIOM viewing system was moved into  place.  The vitrectomy was begun in the midvitreous cavity, where blood and  vitreous were encountered.  Old red blood and old white blood was  encountered as well as fresh light-red blood.  This was all carefully  removed under low suction and rapid cutting.  The attention was carried down  to the macular surface, where a preretinal membrane was seen.  The lighted  pick was used to engage this membrane and free it from its attachment to the  disk and the upper arcade.  The membrane was peeled across with the lighted  pick and removed with the vitreous cutter.  The vitrectomy was carried out  to the far periphery down to the vitreous base for 360 degrees.  Scleral  depression was used for additional vitreous cutting.  Once all vitreous was  removed, the Endocautery was positioned in the eye and cauterization  was  performed on MVE along the upper and lower arcade.  The Endolaser was then  moved into place and 882 burns were placed around the retinal periphery with  the power between 400 and 2000 milliwatts, 1000 microns each, and 0.1 second  each.  Intravitreal Kenalog 0.1 mL was then injected to the vitreous cavity.  The instruments were removed from the eye and 9-0 nylon was used to close  the sclerotomy sites.  They were tested and found to be tight with a Weck-  Cel sponge.  The conjunctiva was closed with wet-field cautery.  Polymyxin  and gentamicin were irrigated into Tenon's space, atropine solution was  applied, and Marcaine was injected around the globe for postop pain.  Decadron 10 mg was injected into the lower subconjunctival space.  Closing  tension was 10 with a Barraquer tonometer.  Complications:  None.  Duration  one hour.  Polysporin, a patch and shield were placed.  The patient was  awakened and taken to recovery in satisfactory condition.                                               Beulah Gandy. Ashley Royalty, M.D.    JDM/MEDQ  D:  08/01/2003  T:  08/02/2003  Job:  [id number]

## 2010-07-24 NOTE — Consult Note (Signed)
NAMEMARLEA, [name] NO.:  [id number]   MEDICAL RECORD NO.:  [id number]          PATIENT TYPE:  INP   LOCATION:  3114                         FACILITY:  [facility]   PHYSICIAN:  Maree Krabbe, M.D.DATE OF BIRTH:  06/20/1932   DATE OF CONSULTATION:  DATE OF DISCHARGE:                                 CONSULTATION   RENAL CONSULTATION:   PRIMARY PHYSICIAN:  Hilda Lias, MD   REASON FOR CONSULTATION:  Renal dialysis and related services.   HISTORY:  The patient is a 77 year old African-American female with ESRD  on dialysis at [facility] on Monday, Wednesday and  Friday.  She has diabetes and hypertension as the cause of her renal  failure.  She developed over the past couple of months a progressive and  severe bilateral upper extremity weakness and then difficulty ambulating  and jerking of her right foot.  She was found to have severe cervical  DJD with cord compression and was seen emergently by neurosurgery and  admitted on the 30th and underwent decompression and fusion procedure to  C3-4 and C4-5.  This was 2 days ago.  We were asked to see her for  dialysis services.  Her last dialysis was 2 days ago and she was due for  dialysis today.  She denies any shortness of breath, chest pain, fever  or chills, sweats, nausea, vomiting, diarrhea, difficulty swallowing,  abdominal pain.  She denies any myalgia or arthralgia.  She has had a  recent fluid drained off of her right knee.  She has had some  uncontrollable jerking of the right foot for several weeks related to  the cervical spine disease.  She has been on dialysis for about 3 years.  She dialysis via right upper arm AV graft and she does not use heparin  due to prolonged graft bleeding.   PAST MEDICAL HISTORY:  1. Diabetes.  2. ESRD right upper arm.  3. AV fistula.  4. Hypertension  5. Secondary to hyperparathyroidism.  6. Anemia.   MEDICATIONS AT HOME:  1. Tylenol #3.  2.  Darvocet.  3. Metoprolol 100 nightly.  4. Nephro-Vite 1 a day.  5. PhosLo 3 with meals.  6. Quinine p.r.n.  7. Norvasc 10 daily.  8. Novolin-N 24 units in the morning on dialysis days, 26 units after      breakfast on nondialysis days.  9. NovoLog, I am not sure of the exact schedule but it alters on      dialysis and nondialysis days.   ALLERGIES:  No allergies.   REVIEW OF SYSTEMS:  As above.   SOCIAL HISTORY:  Lives with her husband.  She has no children.  No  alcohol or tobacco use.   PHYSICAL EXAMINATION:  Blood pressure is 190/80.  Heart rate 80.  Respirations 18.  Temp 96.8.  This is a well-developed, well-nourished  black female in no acute distress.  She is in a hard c-collar.  SKIN:  Warm and dry without rash.  HEENT:  PERRLA/EMI.  Throat was clear.  NECK:  Was not examined.  CHEST:  Clear throughout.  CARDIAC:  Regular rate and rhythm without murmur or gallop.  ABDOMEN:  Soft, nontender.  No masses.  EXTREMITIES:  There is moderate effusion in the right knee which is cool  and nontender.  There is no peripheral edema.  There is a graft in the  right upper arm with a good thrill and bruit.  NEUROLOGIC:  She has spontaneous jerking of the right foot with clonus  in both feet.  In the upper extremities there is significant weakness.  She cannot raise her arms even to shoulder height.  Grip on the left is  2/5 and on the right is 3/5.   LABS:  Sodium 138.  Potassium 4.4.  BUN 41.  Creatinine 7.  Hemoglobin  11.  White blood count is 7000.  EKG normal sinus rhythm with borderline  LVH.   IMPRESSION:  1. End-stage renal disease.  Due for dialysis today but missed and      usually she has heparin-free dialysis due to prolonged graft      bleeding.  2. Diabetes.  On NPH and NovoLog.  3. Status post ACVF of cervical spine 2 days postop.  4. Hypertension.  On Lopressor and Norvasc.  5. Secondary to hyperparathyroidism on PhosLo.  6. Anemia.   PLAN:  1. Get dialysis  in the morning and then Monday, Wednesday, Friday.  2. No heparin with dialysis.  3. Blood pressure control.  4. Blood sugar control  5. Get outpatient dialysis records including EPO dose, any vitamin D      dosing, etc.      Maree Krabbe, M.D.  Electronically Signed     RDS/MEDQ  D:  04/08/2006  T:  04/09/2006  Job:  [id number]

## 2010-07-24 NOTE — Discharge Summary (Signed)
[name], [name] NO.:  [id number]   MEDICAL RECORD NO.:  [id number]          PATIENT TYPE:  IPS   LOCATION:  4005                         FACILITY:  [facility]   PHYSICIAN:  Ellwood Dense, M.D.   DATE OF BIRTH:  1932-03-10   DATE OF ADMISSION:  04/14/2006  DATE OF DISCHARGE:  05/06/2006                         DISCHARGE SUMMARY - REFERRING   DISCHARGE DIAGNOSES:  1. Anterior cervical discectomy with fusion, status post cervical 3-4,      4-5 stenosis with myelopathy April 06, 2006.  2. Pain management.  3. End-stage renal disease with hemodialysis.  4. Insulin-dependent diabetes mellitus.  5. Hypertension.   This is a 77 year old female with end-stage renal disease with  hemodialysis x2 years, was admitted April 06, 2006, with upper  extremity weakness and decreased gait.  X-rays and imaging showed  cervical 3-4, 4-5 stenosis with myelopathy.  Underwent anterior cervical  disc fusion, cervical 3-4, 4-5 on April 06, 2006, per Dr. Jeral Fruit.  Cervical collar was placed.  She remained on dialysis Monday, Wednesday,  Friday; this was to be changed to a Tuesday, Thursday, Saturday schedule  while in the hospital.  Follow-up CT of the neck April 11, 2006, with  mild postoperative edema but no hematoma.  Bouts of hypoglycemia.  Insulin was held.  Blood sugar 96, 99 and 118.   PAST MEDICAL HISTORY:  1. End-stage renal disease with hemodialysis Monday, Wednesday,      Friday.  2. She has had multiple grafts in the past for her hemodialysis.  3. Hypertension.  4. Insulin-dependent diabetes mellitus.  5. Appendectomy.  6. Right cataract surgery.  7. Right second toe amputation.  8. Hysterectomy.   ALLERGIES:  NO KNOWN DRUG ALLERGIES.   SOCIAL HISTORY:  Lives with husband.  Husband works.  Family in area to  assist as needed, although there was some question of possible skilled  nursing facility.   MEDICATIONS PRIOR TO ADMISSION:  1. Darvocet as  needed.  2. Metoprolol 100 mg daily.  3. Nephro-Vite daily.  4. PhosLo 667 mg three tablets three times a day.  5. NovoLog 6 units at lunch.  6. Novolin N insulin 24 units a.c. lunch.  7. Novolin N 26 units breakfast.  8. Quinine sulfate twice daily.   REHABILITATION HOSPITAL COURSE:  Patient was admitted to inpatient rehab  services with therapies initiated on a three-hour daily basis consisting  of physical therapy, occupational therapy, and rehabilitation nursing.  The following issues were addressed during patient's rehabilitation  stay.  Pertaining to Mrs. Erskin's anterior cervical disc fusion for  cervical stenosis, myelopathy April 06, 2006, per Dr. Jeral Fruit, cervical  collar in place, surgical site healing nicely, no signs of infection.  She continued to progress slowly in all areas of her functional  mobility.  Pain management ongoing with the use of Darvocet on a limited  basis.  She had also been placed on subcutaneous Lovenox for deep venous  thrombosis prophylaxis.  She remained on dialysis on a Tuesday,  Thursday, Saturday schedule while here in the hospital.  Renal service  was consulted regarding plan to  change back to Monday, Wednesday, Friday  schedule which she was following prior to her hospital admission.  Her  blood sugars overall were monitored, 116, 134 and 78.  She presently was  on no insulin therapy as this had been discontinued early on in her  hospital course.  Blood pressure with diastolic pressure 63-75.  She  remained on Lopressor 25 mg twice daily.  Overall, her functional status  remained slow.  It was felt that a skilled nursing facility would be  needed.  She was essentially max assist to total assist for activities  of daily living.  A bed became available on May 06, 2006, and  discharge took place at that time.   Latest labs showed a hemoglobin 11.7, hematocrit 34.6, platelets  196,000, wbc 5.4.  Sodium 129, potassium 4.8, BUN 35, creatinine  9.23.   DISCHARGE MEDICATIONS:  At time of dictation  1. Colace 100 mg p.o. b.i.d.  2. Nephro-Vite one tablet p.o. daily.  3. Quinine sulfate 324 mg p.o. b.i.d.  4. Protonix 40 mg p.o. daily.  5. Fosrenol 1000 mg p.o. three times a day.  6. Aranesp 100 mcg intravenously weekly with every dialysis.  7. Lopressor 25 mg p.o. b.i.d.  8. Nepro Vanilla 237 mL p.o. b.i.d.  9. Iron dextran 50 mg every 7 days.   DIET:  Renal diet, 60-2-2 1200 fluid restriction, diabetic diet.   She was discharged in stable condition.   She would follow up with [facility] to continue  arrangements in her hemodialysis.      Mariam Dollar, P.A.    ______________________________  Ellwood Dense, M.D.   DA/MEDQ  D:  05/05/2006  T:  05/05/2006  Job:  [id number]   cc:   [facility]  Reather Littler, M.D.  Hilda Lias, M.D.

## 2010-07-24 NOTE — Discharge Summary (Signed)
[name], [name]                ACCOUNT NO.:  [id number]   MEDICAL RECORD NO.:  [id number]          PATIENT TYPE:  INP   LOCATION:  3038                         FACILITY:  [facility]   PHYSICIAN:  Hilda Lias, M.D.   DATE OF BIRTH:  Oct 04, 1932   DATE OF ADMISSION:  04/06/2006  DATE OF DISCHARGE:  04/14/2006                               DISCHARGE SUMMARY   ADMITTING DIAGNOSES:  1. C3-C4, C4-C5 cervical stenosis, herniated disk.  2. Kidney failure.  3. Diabetes.   DISCHARGE DIAGNOSES:  1. C3-C4, C4-C5 cervical stenosis, herniated disk.  2. Kidney failure.  3. Diabetes.   CLINICAL HISTORY:  The patient was seen in my office because of neck  pain with weakness in the upper and lower extremities.  This happened  about 3 weeks ago, and since then, she had been continuing to get worse.  MRI showed severe stenosis at the level of C3-C4 and C4-C5.  Surgery was  advised.  Laboratory showed increase of the sugar, the glucose, as well  as the triglycerides of 45, bilirubin 243, white cells within normal  limits.  BUN 31.   HOSPITAL COURSE:  The patient was taken to surgery and decompression at  the level of C3-C4 and C4-C5 with fusion followed by a plate and graft  was done.  After the surgery, the patient was kept in the Intensive Care  Unit.  The drains were removed.  She was stable.  She was having some  increase of her sugar, the glucose, as well as episodes of  hyperglycemia.  She was seen by her medical doctor.  Because she is  going to need quite a bit of rehabilitation, she is going to be  transferred to the rehab unit.   CONDITION ON DISCHARGE:  Stable.   MEDICATIONS:  The same as in __________.   ACTIVITY:  Activity will be up to rehab.   DISCHARGE DIET:  ADA diet, 2000 calories.           ______________________________  Hilda Lias, M.D.     EB/MEDQ  D:  04/15/2006  T:  04/17/2006  Job:  [id number]

## 2010-07-24 NOTE — Op Note (Signed)
[name], [name]                ACCOUNT NO.:  [id number]   MEDICAL RECORD NO.:  [id number]          PATIENT TYPE:  AMB   LOCATION:  SDS                          FACILITY:  [facility]   PHYSICIAN:  Balinda Quails, M.D.    DATE OF BIRTH:  [birth year]   DATE OF PROCEDURE:  DATE OF DISCHARGE:  03/21/2006                               OPERATIVE REPORT   SURGEON:  Denman George, MD   ASSISTANT:  Gershon Crane, PA.   ANESTHETIC:  Local with MAC.   ANESTHESIOLOGIST:  Quita Skye. Krista Blue, M.D.   PREOPERATIVE DIAGNOSES:  1. End-stage renal failure.  2. Thrombosis right upper arm arteriovenous graft.  3. Lymphocele right antecubital fossa.   POSTOPERATIVE DIAGNOSES:  1. End-stage renal failure.  2. Thrombosis right upper arm arteriovenous graft.  3. Lymphocele right antecubital fossa.   PROCEDURE:  1. Thrombectomy and revision right upper arm arteriovenous graft.  2. Resection of the lymphocele right antecubital fossa.   OPERATIVE PROCEDURE:  The patient brought to the operating room in  stable hemodynamic condition.  Placed in supine position.  Right arm  prepped and draped in sterile fashion.   Skin and subcutaneous tissues instilled with 1% Xylocaine with  epinephrine.  At longitudinal skin incision made through the right  axilla.  Dissection carried down through subcutaneous tissue with  electrocautery.  The venous side of the graft was mobilized and followed  down to an end-to-end anastomosis to the basilic vein.  The vein  mobilized proximally and encircled with vessel loop.  The venous  anastomosis resected.  Tributaries of vein ligated with 3-0 silk and  divided.  The vein mobilized up in the axilla, controlled with a Gregory  clamp.  The graft then thrombectomized with a 4 and 5 Fogarty catheter.  Good inflow, however, could not be obtained.  Therefore, a second skin  incision was made through the scar in the right antecubital fossa.  There was a large lymphocele present  surrounding the graft.  This was  resected and drained.  The graft then thrombectomized once again with a  5 Fogarty catheter.  Excellent flow obtained.  The graft filled heparin  saline solution and controlled with a fistula clamp.   A new segment of 6 mm Gore-Tex anastomosed end-to-end to divided graft  using running 6-0 Prolene suture.  This was then anastomosed end-to-end  to the divided vein using running 6-0 Prolene suture.  Clamps were then  removed.  Flow seemed a little bit sluggish.  The arterial anastomosis  was therefore re-explored further.  The brachial artery controlled  proximally and distal to the anastomosis with bulldog clamps.  The graft  controlled with a fistula clamp.  A transverse graft incision made  through the graft just beyond the arterial anastomosis.  This revealed  the arterial anastomosis to be widely patent.  A 2 dilator was easily  passed proximally and distally in the artery.   The transverse graft incision closed with running 6-0 Prolene suture.  Clamps were then again removed.  Excellent flow present.  Adequate  hemostasis obtained.  Sponge and  instrument counts were correct.   Subcutaneous tissue closed with running 3-0 Vicryl suture in the right  axilla.  The skin closed with 4-0 Monocryl.   The lymphocele was closed with interrupted 3-0 Vicryl suture in several  layers and the skin closed with 4-0 Monocryl.  Steri-Strips applied.   The patient tolerated procedure well.  Transferred to recovery room in  stable condition.      Balinda Quails, M.D.  Electronically Signed     PGH/MEDQ  D:  03/21/2006  T:  03/22/2006  Job:  [id number]

## 2010-07-24 NOTE — Op Note (Signed)
[name], [name]                ACCOUNT NO.:  [id number]   MEDICAL RECORD NO.:  [id number]          PATIENT TYPE:  AMB   LOCATION:  SDS                          FACILITY:  [facility]   PHYSICIAN:  Di Kindle. Edilia Bo, M.D.DATE OF BIRTH:  1932-09-22   DATE OF PROCEDURE:  05/14/2005  DATE OF DISCHARGE:  05/14/2005                                 OPERATIVE REPORT   PREOPERATIVE DIAGNOSIS:  Chronic renal failure.   POSTOPERATIVE DIAGNOSIS:  Chronic renal failure.   PROCEDURE:  Thrombectomy of right forearm arteriovenous graft with insertion  of new segment graft to the more proximal brachial vein and also  intraoperative fistulogram x2.   SURGEON:  Di Kindle. Edilia Bo, M.D.   ASSISTANT:  Pecola Leisure, P.A.   ANESTHESIA:  Local with sedation.   TECHNIQUE:  The patient was taken to the operating room, sedated by  anesthesia. The right upper extremity was prepped and draped in the usual  sterile fashion.  After the skin was infiltrated with 1% lidocaine, the  incision over the venous anastomosis was opened and the venous limb of the  graft dissected free.  There was intimal hyperplasia of the venous  anastomosis.  I elected to jump higher on the vein.  The incision was  extended proximally.  The more proximal vein was a good-sized vein and it  was clamped and the venous anastomosis excised.  The patient was  heparinized.  Graft thrombectomy was achieved using a #4 Fogarty catheter.  The arterial plug was retrieved.  Excellent flow was established to the  graft.  The graft was flushed with heparinized saline clamp.  There were no  real problems in pulling the catheter through the body of graft.  Venous  thrombectomy was then performed.  There was some mild narrowing noted in  pulling the catheter through the graft but to get above these areas would  require a very long extension. Distally, the vein took a 5 mm dilator  easily.  A new segment of graft was brought to the field,  spatulated and  sewn end-to-end to the vein using continuous 6-0 Prolene suture. The new  segment of graft was then cut to appropriate length and sewn end-to-end to  the old graft using a continuous 6-0 Prolene suture.  At the completion,  there was excellent thrill in the graft although it was slightly pulsatile.  I shot two intraoperative fistulograms.  I did not see problems of the graft  itself or the arterial anastomosis.  There was some narrowing above the  venous anastomoses but to get above this would require a fairly long  extension the graft.  I did explore this area after looking fistulogram and  it did not appear that there was any retained clot.  The vein just became  somewhat smaller right where it gave off a branch.  If this graft clots in  the future, the option is to place a new upper arm graft or to try to jump  above this area although again this would result in a fairly long graft.  Hemostasis was obtained in  the  wound.  The wound was closed with a deep layer of 3-0 Vicryl, and the  skin closed with 4-0 Vicryl.  Sterile dressing was applied. The patient  tolerated the procedure well and was transferred to the recovery room in  satisfactory condition.  All needle and sponge counts were correct.      Di Kindle. Edilia Bo, M.D.  Electronically Signed     CSD/MEDQ  D:  05/14/2005  T:  05/15/2005  Job:  [id number]

## 2010-07-24 NOTE — Op Note (Signed)
[name], [name]                ACCOUNT NO.:  [id number]   MEDICAL RECORD NO.:  [id number]          PATIENT TYPE:  OBV   LOCATION:  2550                         FACILITY:  [facility]   PHYSICIAN:  Larina Earthly, M.D.    DATE OF BIRTH:  1932-03-16   DATE OF PROCEDURE:  08/03/2004  DATE OF DISCHARGE:                                 OPERATIVE REPORT   PREOPERATIVE DIAGNOSIS:  End stage renal disease with occluded right forearm  loop arteriovenous Gore-Tex graft.   POSTOPERATIVE DIAGNOSIS:  End stage renal disease with occluded right  forearm loop arteriovenous Gore-Tex graft.   PROCEDURE:  Thrombectomy and interposition arm graft revision to a higher  basilic vein of left forearm loop arteriovenous graft.   SURGEON:  Larina Earthly, M.D.   ASSISTANT:  Rowe Clack, P.A.-C.   ANESTHESIA:  Monitored anesthesia care.   COMPLICATIONS:  None.   DISPOSITION:  To the recovery room stable.   PROCEDURE IN DETAIL:  The patient was taken to the operating room and placed  in the supine position where the area of the left arm was prepped and draped  in the usual sterile fashion.  Using local anesthesia, an incision was made  over the antecubital space and carried down to isolate the arterial and  venous anastomoses.  The graft was opened near the venous anastomosis.  There was a tight stenosis at the venous anastomosis and sclerosis of the  basilic vein.  The graft, itself, was thrombectomized.  The arterialized  plug was removed.  Good in flow was encountered and this was flushed with  heparinized saline and reoccluded.  Next, a separate incision was made over  the above elbow medial arm and carried down to isolate the basilic vein  which is of large caliber.  The vein was occluded proximally and distally,  was opened with an 11 blade, and extended longitudinally with Potts  scissors.  A 5 dilator passed easily without resistance through this vein.  A new interposition graft of 6 mm Gore-Tex  was brought into the field, was  spatulated, and sewn end-to-side to the vein with running 6-0 Prolene  suture.  This was then flushed with heparinized saline and reoccluded.  This  was then brought down to the prior venous anastomosis.  The basilic vein was  ligated proximally and distally to the old venous anastomosis.  The old  graft was transected near the venous anastomosis.  The interposition graft  was cut  to the appropriate length and was sewn end-to-end to the old  graft with a running 6-0 Prolene sutures.  The clamps were removed and a  good thrill was noted.  The wounds were irrigated with saline.  Hemostasis  was obtained with electrocautery.  The wounds were closed with 3-0 Vicryl in  the subcutaneous and subcuticular tissue.  Benzoin and Steri-Strips were  applied.       TFE/MEDQ  D:  08/03/2004  T:  08/03/2004  Job:  [id number]

## 2010-07-24 NOTE — Discharge Summary (Signed)
[name], [name] NO.:  [id number]   MEDICAL RECORD NO.:  [id number]          PATIENT TYPE:  INP   LOCATION:  6712                         FACILITY:  [facility]   PHYSICIAN:  Danise Edge, M.D.   DATE OF BIRTH:  1932/05/13   DATE OF ADMISSION:  02/21/2007  DATE OF DISCHARGE:  02/24/2007                               DISCHARGE SUMMARY   DISCHARGE DIAGNOSIS:  Resolved fecal impaction.   DISCHARGE MEDICATIONS:  1. Metoprolol 25 mg twice daily.  2. Norvasc 2.5 mg daily.  3. Rena-vite daily.  4. Fosrenol 1000 mg 3 times daily.  5. Klonopin 0.5 mg daily.  6. Quinine sulfate for nocturnal leg cramps.   HOSPITAL COURSE:  Ms. [name] is a 77 year old female born on  1933/02/19.  Ms. [name] was admitted to the hospital on February 21, 2007, to manage a fecal impaction.  Ms. [name] has a chronic renal  failure and is on dialysis.  Her chronic medical problems include  diabetes and hypertension.   On February 24, 2007, Ms. [name] fecal impaction had cleared after  receiving enemas and a colonic lavage prep.  On February 24, 2007, her  diagnostic proctocolonoscopy to the cecum was normal.  There was no  colorectal neoplasia identified.  There was no signs of colonic  obstruction and the fecal impaction had cleared.  She was discharged in  stable medical condition on February 24, 2007.           ______________________________  Danise Edge, M.D.     MJ/MEDQ  D:  06/07/2007  T:  06/08/2007  Job:  [id number]

## 2010-07-24 NOTE — Op Note (Signed)
NAMEJAIYANA, CANALE                ACCOUNT NO.:  [id number]   MEDICAL RECORD NO.:  [id number]          PATIENT TYPE:  AMB   LOCATION:  SDS                          FACILITY:  [facility]   PHYSICIAN:  Di Kindle. Edilia Bo, M.D.DATE OF BIRTH:  [birth year]   DATE OF PROCEDURE:  11/30/2005  DATE OF DISCHARGE:  11/30/2005                                 OPERATIVE REPORT   PREOPERATIVE DIAGNOSIS:  Chronic renal failure with clotted right forearm AV  graft.   POSTOPERATIVE DIAGNOSIS:  Chronic renal failure with clotted right forearm  AV graft.   PROCEDURE:  Thrombectomy of right forearm AV graft with insertion of new  segment of graft of the more proximal brachial vein also intraoperative  fistulogram.   SURGEON:  Di Kindle. Edilia Bo, M.D.   ASSISTANT:  Nurse.   ANESTHESIA:  Local with sedation.   TECHNIQUE:  The patient was taken to the operating room, sedated by  anesthesia.  The right upper extremity was prepped and draped in the usual  sterile fashion.  After the skin was infiltrated with 1% lidocaine, incision  over the venous anastomosis was opened and the venous limb of the graft  dissected free.  The graft was divided.  The patient was heparinized.  Graft  thrombectomy was achieved using a #4 Fogarty catheter.  The arterial plug  was clearly retrieved.  The graft was flushed with heparinized saline and  clamped.  I dissected up higher on the brachial vein and had to go fairly  high by extending the incision.  I was able to get up to a much better  appearing vein above the area of intimal hyperplasia.  A 7 mm graft was  spatulated and sewn end-to-end to the more proximal spatulated brachial  vein.  This was done with 6-0 Prolene suture.  The graft was then pulled to  the appropriate length for anastomosis to the old graft which was done with  continuous 6-0 Prolene suture.  At the completion there was an excellent  thrill in the graft.  Intraoperative fistulogram was obtained  of the graft  which showed no problems of the graft itself and the arterial anastomosis  was patent.  Hemostasis was obtained in the wound.  The wound was closed  with a deep layer of 3-0 Vicryl.  The skin closed with 4-0 Vicryl.  Sterile  dressing was applied.  The patient tolerated the procedure well and was  transferred to the recovery room in satisfactory condition.  All needle and  sponge counts were correct.      Di Kindle. Edilia Bo, M.D.  Electronically Signed     CSD/MEDQ  D:  11/30/2005  T:  12/02/2005  Job:  [id number]

## 2010-07-24 NOTE — Op Note (Signed)
NAME:  [name], [name]                          ACCOUNT NO.:  [id number]   MEDICAL RECORD NO.:  [id number]                   PATIENT TYPE:  OIB   LOCATION:  2852                                 FACILITY:  [facility]   PHYSICIAN:  Larina Earthly, M.D.                 DATE OF BIRTH:  08-23-1932   DATE OF PROCEDURE:  05/20/2003  DATE OF DISCHARGE:  05/20/2003                                 OPERATIVE REPORT   PREOPERATIVE DIAGNOSIS:  Progressive renal insufficiency.   POSTOPERATIVE DIAGNOSIS:  Progressive renal insufficiency.   PROCEDURE:  Placement of new right forearm AV Gore-Tex graft.   SURGEON:  Larina Earthly, M.D.   ASSISTANT:  Pecola Leisure, P.A.-C.   ANESTHESIA:  MAC.   COMPLICATIONS:  None.   DISPOSITION:  To the recovery room, stable.   PROCEDURE IN DETAIL:  The patient was taken to the operating room, placed on  the stretcher.  The right arm prepped and draped using the sterile fashion.  Using local anesthesia, it was made over the antecubital space, carried down  and dissected the brachial artery which was a good caliber.  The cephalic  vein was small and unusable for outflow.  The basilic vein was of moderate  caliber.  The basilic vein was opened proximally and distally.  It was  opened with an 11 blade and Potts scissors.  A 4 dilator was passed through  this and this was dilated with heparinized saline.  A separate incision was  made over the distal forearm and a loop configuration tunnel was created.  The 6 mm standard wall stretch Gore-Tex graft was brought through the  tunnel.  The graft was spatulated and sewn end-to-side to the vein with a  running 6-0 Prolene suture.  The clamps were removed from the vein.  The  graft was flushed with heparinized saline and re-occluded.  Next, the  brachial artery was occluded proximally and distally.  It was opened with an  11 blade, Potts scissors.  A small arteriotomy was created.  The graft was  cut to the appropriate  length, slightly spatulated, sewn end-to-side to the  artery with a running 6-0 Prolene suture.  Clamps were removed and an  excellent thrill was noted.  The wounds were irrigated with saline.  Hemostasis was achieved with electrocautery.  The wounds were closed with 3-  0 Vicryl in the subcutaneous and subcuticular tissue.  Benzoin and Steri-  Strips were applied.  A sterile dressing was applied and the patient was  taken to the recovery room in stable condition.                                               Larina Earthly, M.D.    TFE/MEDQ  D:  05/20/2003  T:  05/21/2003  Job:  [id number]

## 2010-11-25 LAB — POCT I-STAT 4, (NA,K, GLUC, HGB,HCT)
Glucose, Bld: 79
Hemoglobin: 13.9
Potassium: 3.2 — ABNORMAL LOW

## 2010-11-27 LAB — POCT I-STAT 4, (NA,K, GLUC, HGB,HCT)
Glucose, Bld: 85
Operator id: 181601

## 2010-12-11 LAB — RENAL FUNCTION PANEL
Albumin: 2.4 — ABNORMAL LOW
Phosphorus: 2.6
Potassium: 3.6
Sodium: 133 — ABNORMAL LOW

## 2010-12-11 LAB — POCT I-STAT 4, (NA,K, GLUC, HGB,HCT)
HCT: 35 — ABNORMAL LOW
Hemoglobin: 11.9 — ABNORMAL LOW
Potassium: 3.4 — ABNORMAL LOW
Sodium: 136

## 2010-12-11 LAB — CBC
Hemoglobin: 11.6 — ABNORMAL LOW
Platelets: 159
RBC: 3.63 — ABNORMAL LOW
RDW: 15.8 — ABNORMAL HIGH
RDW: 15.8 — ABNORMAL HIGH

## 2010-12-11 LAB — BASIC METABOLIC PANEL
Calcium: 9.6
GFR calc Af Amer: 15 — ABNORMAL LOW
GFR calc non Af Amer: 12 — ABNORMAL LOW
Glucose, Bld: 85
Sodium: 137

## 2010-12-14 LAB — POCT I-STAT 4, (NA,K, GLUC, HGB,HCT)
Hemoglobin: 14.6
Operator id: 206361
Potassium: 3.7
Sodium: 136

## 2010-12-15 LAB — POTASSIUM
Potassium: 4.6
Potassium: 7

## 2010-12-15 LAB — POCT I-STAT 4, (NA,K, GLUC, HGB,HCT)
Glucose, Bld: 56 — ABNORMAL LOW
Hemoglobin: 13.9

## 2010-12-16 LAB — I-STAT 8, (EC8 V) (CONVERTED LAB)
Bicarbonate: 27.2 — ABNORMAL HIGH
HCT: 47 — ABNORMAL HIGH
Potassium: 4.4
TCO2: 29
pCO2, Ven: 50.8 — ABNORMAL HIGH
pH, Ven: 7.336 — ABNORMAL HIGH

## 2010-12-16 LAB — POCT I-STAT CREATININE: Operator id: 285841

## 2010-12-21 LAB — COMPREHENSIVE METABOLIC PANEL
CO2: 31
Calcium: 9.8
Chloride: 98
Creatinine, Ser: 3.8 — ABNORMAL HIGH
GFR calc non Af Amer: 12 — ABNORMAL LOW
Glucose, Bld: 83
Total Bilirubin: 0.8

## 2010-12-21 LAB — CBC
HCT: 44.5
Hemoglobin: 14.5
MCHC: 32.7
MCV: 96
RBC: 4.64

## 2010-12-21 LAB — POCT I-STAT 4, (NA,K, GLUC, HGB,HCT)
Glucose, Bld: 78
Operator id: 181601
Potassium: 3.6

## 2011-01-27 ENCOUNTER — Other Ambulatory Visit (HOSPITAL_COMMUNITY): Payer: Self-pay | Admitting: Nephrology

## 2011-01-27 ENCOUNTER — Encounter (HOSPITAL_COMMUNITY): Payer: Self-pay

## 2011-01-27 ENCOUNTER — Ambulatory Visit (HOSPITAL_COMMUNITY)
Admission: RE | Admit: 2011-01-27 | Discharge: 2011-01-27 | Disposition: A | Discharge status: Home / Self Care | Payer: Medicare Other | Source: Ambulatory Visit | Attending: Nephrology | Admitting: Nephrology

## 2011-01-27 DIAGNOSIS — Y849 Medical procedure, unspecified as the cause of abnormal reaction of the patient, or of later complication, without mention of misadventure at the time of the procedure: Secondary | ICD-10-CM | POA: Insufficient documentation

## 2011-01-27 DIAGNOSIS — E119 Type 2 diabetes mellitus without complications: Secondary | ICD-10-CM | POA: Insufficient documentation

## 2011-01-27 DIAGNOSIS — Z992 Dependence on renal dialysis: Secondary | ICD-10-CM | POA: Insufficient documentation

## 2011-01-27 DIAGNOSIS — N186 End stage renal disease: Secondary | ICD-10-CM | POA: Insufficient documentation

## 2011-01-27 DIAGNOSIS — T82898A Other specified complication of vascular prosthetic devices, implants and grafts, initial encounter: Secondary | ICD-10-CM | POA: Insufficient documentation

## 2011-01-27 DIAGNOSIS — B1920 Unspecified viral hepatitis C without hepatic coma: Secondary | ICD-10-CM | POA: Insufficient documentation

## 2011-01-27 HISTORY — DX: Anemia, unspecified: D64.9

## 2011-01-27 HISTORY — DX: Peripheral vascular disease, unspecified: I73.9

## 2011-01-27 HISTORY — DX: Type 2 diabetes mellitus without complications: E11.9

## 2011-01-27 HISTORY — DX: Unspecified viral hepatitis C without hepatic coma: B19.20

## 2011-01-27 HISTORY — DX: Hyperlipidemia, unspecified: E78.5

## 2011-01-27 MED ORDER — IOHEXOL 300 MG/ML  SOLN
100.0000 mL | Freq: Once | INTRAMUSCULAR | Status: AC | PRN
  Administered 2011-01-27: 50 mL via INTRAVENOUS

## 2011-01-27 MED ORDER — FENTANYL CITRATE 0.05 MG/ML IJ SOLN
INTRAMUSCULAR | Status: AC
  Filled 2011-01-27: qty 2

## 2011-01-27 MED ORDER — ALTEPLASE 2 MG IJ SOLR
2.0000 mg | Freq: Once | INTRAMUSCULAR | Status: AC
  Administered 2011-01-27: 2 mg
  Filled 2011-01-27: qty 2

## 2011-01-27 MED ORDER — MIDAZOLAM HCL 2 MG/2ML IJ SOLN
INTRAMUSCULAR | Status: AC
  Filled 2011-01-27: qty 2

## 2011-01-27 MED ORDER — HEPARIN SODIUM (PORCINE) 1000 UNIT/ML IJ SOLN
INTRAMUSCULAR | Status: AC
  Administered 2011-01-27: 2000 [IU]
  Filled 2011-01-27: qty 1

## 2011-01-27 NOTE — ED Notes (Signed)
Pt  Discharged without any complaints.  Consuelo Pandy picked her up and will take her to Dialysis now.

## 2011-01-27 NOTE — Procedures (Signed)
Successful LUE AVG declot w/ 6 mm pta No comp Ready to use

## 2011-01-27 NOTE — H&P (Signed)
[name] is an 77 y.o. female.   Chief Complaint: clotted left arm dialysis graft HPI: Patient with history of ESRD and clotted left upper arm AVGG presents today for thrombolysis, possible angioplasty/ stenting of AVGG or placement of new temporary catheter if necessary.  Past Medical History  Diagnosis Date  . ESRD (end stage renal disease)   . DM (diabetes mellitus)   . Hepatitis C   . Anemia   . PVD (peripheral vascular disease)   . CAD (coronary artery disease)   . Breast cancer   . Hyperparathyroidism   . Dyslipidemia     Past Surgical History  Procedure Date  . Cataract rt eye   . Mastectomy  (left)   . Abdominal hysterectomy     No family history on file. Social History:  does not have a smoking history on file. She does not have any smokeless tobacco history on file. Her alcohol and drug histories not on file.  Allergies: Allergies not on file  Medications: amlodipine, sensipar, clonazepam, metoprolol,norvasc, nephrovite  No results found for this or any previous visit (from the past 48 hour(s)). No results found.  Review of Systems  Constitutional: Negative for fever and chills.  Respiratory: Negative for cough and shortness of breath.   Cardiovascular: Negative for chest pain.  Gastrointestinal: Negative for nausea, vomiting and abdominal pain.  Neurological: Negative for headaches.  Endo/Heme/Allergies: Does not bruise/bleed easily.  Vital signs:  BP  181/80  HR  66  TEMP  97.8  O2 SATS 100% RA Physical Exam  Constitutional: She is oriented to person, place, and time. She appears well-developed and well-nourished.  Cardiovascular: Normal rate and regular rhythm.        No thrill/bruit in left upper arm AVGG  Respiratory: Effort normal and breath sounds normal.  GI: Soft. Bowel sounds are normal.  Musculoskeletal: Normal range of motion.  Neurological: She is alert and oriented to person, place, and time.  Psychiatric: She has a normal mood and  affect.     Assessment/Plan: Patient with history of ESRD and clotted left upper arm AVGG; plan is for thrombolysis, possible angioplasty/stenting of graft or placement of new temporary dialysis catheter if needed. Najib Colmenares,D KEVIN 01/27/2011, 9:30 AM

## 2011-02-15 ENCOUNTER — Encounter (HOSPITAL_COMMUNITY): Payer: Self-pay

## 2011-02-15 ENCOUNTER — Other Ambulatory Visit (HOSPITAL_COMMUNITY): Payer: Self-pay | Admitting: Nephrology

## 2011-02-15 ENCOUNTER — Ambulatory Visit (HOSPITAL_COMMUNITY)
Admission: RE | Admit: 2011-02-15 | Discharge: 2011-02-15 | Disposition: A | Discharge status: Home / Self Care | Payer: Medicare Other | Source: Ambulatory Visit | Attending: Nephrology | Admitting: Nephrology

## 2011-02-15 VITALS — BP 171/67 | HR 63 | Temp 98.1°F | Resp 11

## 2011-02-15 DIAGNOSIS — I120 Hypertensive chronic kidney disease with stage 5 chronic kidney disease or end stage renal disease: Secondary | ICD-10-CM | POA: Insufficient documentation

## 2011-02-15 DIAGNOSIS — Y849 Medical procedure, unspecified as the cause of abnormal reaction of the patient, or of later complication, without mention of misadventure at the time of the procedure: Secondary | ICD-10-CM | POA: Insufficient documentation

## 2011-02-15 DIAGNOSIS — N186 End stage renal disease: Secondary | ICD-10-CM

## 2011-02-15 DIAGNOSIS — T82898A Other specified complication of vascular prosthetic devices, implants and grafts, initial encounter: Secondary | ICD-10-CM | POA: Insufficient documentation

## 2011-02-15 DIAGNOSIS — E119 Type 2 diabetes mellitus without complications: Secondary | ICD-10-CM | POA: Insufficient documentation

## 2011-02-15 DIAGNOSIS — Z992 Dependence on renal dialysis: Secondary | ICD-10-CM | POA: Insufficient documentation

## 2011-02-15 MED ORDER — HEPARIN SODIUM (PORCINE) 1000 UNIT/ML IJ SOLN
INTRAMUSCULAR | Status: AC
  Administered 2011-02-15: 3 [IU]
  Filled 2011-02-15: qty 1

## 2011-02-15 MED ORDER — FENTANYL CITRATE 0.05 MG/ML IJ SOLN
INTRAMUSCULAR | Status: AC | PRN
  Administered 2011-02-15 (×2): 25 ug via INTRAVENOUS
  Administered 2011-02-15: 50 ug via INTRAVENOUS

## 2011-02-15 MED ORDER — FENTANYL CITRATE 0.05 MG/ML IJ SOLN
INTRAMUSCULAR | Status: AC
  Filled 2011-02-15: qty 4

## 2011-02-15 MED ORDER — MIDAZOLAM HCL 5 MG/5ML IJ SOLN
INTRAMUSCULAR | Status: AC | PRN
  Administered 2011-02-15 (×2): 1 mg via INTRAVENOUS

## 2011-02-15 MED ORDER — IOHEXOL 300 MG/ML  SOLN
100.0000 mL | Freq: Once | INTRAMUSCULAR | Status: AC | PRN
  Administered 2011-02-15: 40 mL via INTRAVENOUS

## 2011-02-15 MED ORDER — MIDAZOLAM HCL 2 MG/2ML IJ SOLN
INTRAMUSCULAR | Status: AC
  Filled 2011-02-15: qty 4

## 2011-02-15 MED ORDER — ALTEPLASE 100 MG IV SOLR
2.0000 mg | Freq: Once | INTRAVENOUS | Status: AC
  Administered 2011-02-15: 2 mg
  Filled 2011-02-15: qty 2

## 2011-02-15 NOTE — Procedures (Signed)
Technically successful declot of left upper extremity graft.  No immediate post procedural complications.

## 2011-02-15 NOTE — H&P (Signed)
[name] is an 78 y.o. female.   Chief Complaint: Clotted dialysis graft HPI: pleasant 77 yo female went for dialysis today and they were unable to access graft. Sent here for declot today.  Past Medical History  Diagnosis Date  . ESRD (end stage renal disease)   . DM (diabetes mellitus)   . Hepatitis C   . Anemia   . PVD (peripheral vascular disease)   . CAD (coronary artery disease)   . Breast cancer   . Hyperparathyroidism   . Dyslipidemia     Past Surgical History  Procedure Date  . Cataract rt eye   . Mastectomy  (left)   . Abdominal hysterectomy     History reviewed. No pertinent family history. Social History:  does not have a smoking history on file. She does not have any smokeless tobacco history on file. Her alcohol and drug histories not on file.  Allergies: No Known Allergies  Medications Prior to Admission  Medication Sig Dispense Refill  . amLODipine (NORVASC) 5 MG tablet Take 5 mg by mouth daily.        Marland Kitchen b complex-vitamin c-folic acid (NEPHRO-VITE) 0.8 MG TABS Take 0.8 mg by mouth at bedtime.        . calcium acetate (PHOSLO) 667 MG capsule Take 667 mg by mouth 3 (three) times daily with meals.        . cinacalcet (SENSIPAR) 30 MG tablet Take 30 mg by mouth daily.        . cinacalcet (SENSIPAR) 60 MG tablet Take 60 mg by mouth at bedtime.        . clonazePAM (KLONOPIN) 0.5 MG tablet Take 0.5 mg by mouth at bedtime as needed.        Marland Kitchen lanthanum (FOSRENOL) 1000 MG chewable tablet Chew 1,000 mg by mouth 3 (three) times daily with meals.        . metoprolol tartrate (LOPRESSOR) 25 MG tablet Take 25 mg by mouth 2 (two) times daily.         Medications Prior to Admission  Medication Dose Route Frequency Provider Last Rate Last Dose  . alteplase (ACTIVASE) injection 2 mg  2 mg Intracatheter Once Simonne Come        No results found for this or any previous visit (from the past 48 hour(s)). No results found.  Review of Systems  Constitutional: Negative for  fever and chills.  Respiratory: Positive for cough and sputum production. Negative for shortness of breath.   Cardiovascular: Negative for chest pain and palpitations.  Endo/Heme/Allergies: Bruises/bleeds easily.    Blood pressure 177/69, pulse 74, temperature 98.1 F (36.7 C), temperature source Oral, resp. rate 16, SpO2 100.00%. Physical Exam  Heent - unremarkable - airway - 1 Heart - RRR - distant Lungs - clear Abd - soft non tender  Assessment/Plan Left upper extremity dialysis graft declot today - Dr. Grace Isaac  [name] 02/15/2011, 10:30 AM

## 2011-02-15 NOTE — ED Notes (Signed)
Potassium Results Called into [facility], [place]

## 2011-02-17 LAB — POCT I-STAT 4, (NA,K, GLUC, HGB,HCT): Sodium: 138 mEq/L (ref 135–145)

## 2011-03-10 DIAGNOSIS — Z992 Dependence on renal dialysis: Secondary | ICD-10-CM | POA: Diagnosis not present

## 2011-03-10 DIAGNOSIS — D631 Anemia in chronic kidney disease: Secondary | ICD-10-CM | POA: Diagnosis not present

## 2011-03-10 DIAGNOSIS — D509 Iron deficiency anemia, unspecified: Secondary | ICD-10-CM | POA: Diagnosis not present

## 2011-03-10 DIAGNOSIS — N2581 Secondary hyperparathyroidism of renal origin: Secondary | ICD-10-CM | POA: Diagnosis not present

## 2011-03-10 DIAGNOSIS — E119 Type 2 diabetes mellitus without complications: Secondary | ICD-10-CM | POA: Diagnosis not present

## 2011-03-10 DIAGNOSIS — N186 End stage renal disease: Secondary | ICD-10-CM | POA: Diagnosis not present

## 2011-03-15 ENCOUNTER — Other Ambulatory Visit (HOSPITAL_COMMUNITY): Payer: Self-pay | Admitting: Nephrology

## 2011-03-15 ENCOUNTER — Encounter (HOSPITAL_COMMUNITY): Payer: Self-pay

## 2011-03-15 ENCOUNTER — Ambulatory Visit (HOSPITAL_COMMUNITY)
Admission: RE | Admit: 2011-03-15 | Discharge: 2011-03-15 | Disposition: A | Discharge status: Home / Self Care | Payer: Medicare Other | Source: Ambulatory Visit | Attending: Nephrology | Admitting: Nephrology

## 2011-03-15 VITALS — BP 160/59 | HR 60 | Temp 98.1°F | Resp 12

## 2011-03-15 DIAGNOSIS — T82898A Other specified complication of vascular prosthetic devices, implants and grafts, initial encounter: Secondary | ICD-10-CM | POA: Diagnosis not present

## 2011-03-15 DIAGNOSIS — B1920 Unspecified viral hepatitis C without hepatic coma: Secondary | ICD-10-CM | POA: Diagnosis not present

## 2011-03-15 DIAGNOSIS — I871 Compression of vein: Secondary | ICD-10-CM | POA: Insufficient documentation

## 2011-03-15 DIAGNOSIS — I739 Peripheral vascular disease, unspecified: Secondary | ICD-10-CM | POA: Diagnosis not present

## 2011-03-15 DIAGNOSIS — Z853 Personal history of malignant neoplasm of breast: Secondary | ICD-10-CM | POA: Insufficient documentation

## 2011-03-15 DIAGNOSIS — N186 End stage renal disease: Secondary | ICD-10-CM | POA: Diagnosis not present

## 2011-03-15 DIAGNOSIS — E119 Type 2 diabetes mellitus without complications: Secondary | ICD-10-CM | POA: Diagnosis not present

## 2011-03-15 DIAGNOSIS — E785 Hyperlipidemia, unspecified: Secondary | ICD-10-CM | POA: Insufficient documentation

## 2011-03-15 DIAGNOSIS — D649 Anemia, unspecified: Secondary | ICD-10-CM | POA: Insufficient documentation

## 2011-03-15 DIAGNOSIS — Y832 Surgical operation with anastomosis, bypass or graft as the cause of abnormal reaction of the patient, or of later complication, without mention of misadventure at the time of the procedure: Secondary | ICD-10-CM | POA: Insufficient documentation

## 2011-03-15 MED ORDER — IOHEXOL 300 MG/ML  SOLN
100.0000 mL | Freq: Once | INTRAMUSCULAR | Status: AC | PRN
  Administered 2011-03-15: 40 mL via INTRAVENOUS

## 2011-03-15 MED ORDER — ALTEPLASE 100 MG IV SOLR
INTRAVENOUS | Status: AC | PRN
  Administered 2011-03-15: 2 mg

## 2011-03-15 MED ORDER — ALTEPLASE 100 MG IV SOLR
2.0000 mg | Freq: Once | INTRAVENOUS | Status: DC
  Filled 2011-03-15: qty 2

## 2011-03-15 MED ORDER — HEPARIN SODIUM (PORCINE) 1000 UNIT/ML IJ SOLN
INTRAMUSCULAR | Status: AC | PRN
  Administered 2011-03-15: 3000 [IU] via INTRAVENOUS

## 2011-03-15 MED ORDER — HEPARIN SODIUM (PORCINE) 1000 UNIT/ML IJ SOLN
INTRAMUSCULAR | Status: AC
  Filled 2011-03-15: qty 1

## 2011-03-15 NOTE — ED Notes (Signed)
In nurses station.VS stable Dressing over left arm graft clean dry and intact. Bruit present.No pain. Husband aware to come pick patient up.

## 2011-03-15 NOTE — H&P (Signed)
[name] is an 78 y.o. female.   Chief Complaint: ESRD on HD with clotted left upper arm AVG clotted. HPI: past declot performed on 02/15/11 and 01/27/11.   Past Medical History  Diagnosis Date  . ESRD (end stage renal disease)   . DM (diabetes mellitus)   . Hepatitis C   . Anemia   . PVD (peripheral vascular disease)   . CAD (coronary artery disease)   . Breast cancer   . Hyperparathyroidism   . Dyslipidemia     Past Surgical History  Procedure Date  . Cataract rt eye   . Mastectomy  (left)   . Abdominal hysterectomy   . Arteriovenous graft placement   . Left median nerve decompression   . Cervical discectomy   . Toe amuputation- rt 1,2,3rd toes    Social History:  does not have a smoking history on file. She does not have any smokeless tobacco history on file. Her alcohol and drug histories not on file.  Allergies: No Known Allergies  Medications Prior to Admission  Medication Sig Dispense Refill  . amLODipine (NORVASC) 5 MG tablet Take 5 mg by mouth daily.        Marland Kitchen b complex-vitamin c-folic acid (NEPHRO-VITE) 0.8 MG TABS Take 0.8 mg by mouth at bedtime.        . calcium acetate (PHOSLO) 667 MG capsule Take 667 mg by mouth 3 (three) times daily with meals.        . cinacalcet (SENSIPAR) 30 MG tablet Take 30 mg by mouth daily.       . cinacalcet (SENSIPAR) 60 MG tablet Take 60 mg by mouth at bedtime.        . clonazePAM (KLONOPIN) 0.5 MG tablet Take 0.5 mg by mouth at bedtime as needed.        Marland Kitchen lanthanum (FOSRENOL) 1000 MG chewable tablet Chew 1,000 mg by mouth 3 (three) times daily with meals.        . metoprolol tartrate (LOPRESSOR) 25 MG tablet Take 25 mg by mouth 2 (two) times daily.         Medications Prior to Admission  Medication Dose Route Frequency Provider Last Rate Last Dose  . alteplase (ACTIVASE) injection 2 mg  2 mg Intracatheter Once Simonne Come        Results for orders placed during the hospital encounter of 03/15/11 (from the past 48 hour(s))    GLUCOSE, CAPILLARY     Status: Normal   Collection Time   03/15/11  9:21 AM      Component Value Range Comment   Glucose-Capillary 76  70 - 99 (mg/dL)     Review of Systems  Constitutional: Negative for fever and chills.  HENT: Positive for neck pain.   Eyes: Positive for blurred vision.  Respiratory: Negative.   Cardiovascular: Negative.  Negative for chest pain and palpitations.  Musculoskeletal: Positive for myalgias and back pain.  Skin: Negative.   Neurological: Positive for sensory change and focal weakness.  Endo/Heme/Allergies: Bruises/bleeds easily.    Blood pressure 183/71, pulse 78, temperature 98.1 F (36.7 C), temperature source Oral, SpO2 100.00%. Physical Exam  Constitutional: She is oriented to person, place, and time. She appears well-developed and well-nourished. No distress.  Cardiovascular: Normal rate, regular rhythm and normal heart sounds.  Exam reveals no gallop and no friction rub.   No murmur heard. Respiratory: Effort normal and breath sounds normal. No respiratory distress. She has no wheezes.  GI: Soft. Bowel sounds are normal.  Musculoskeletal:       Left upper arm graft with no palpable thrill, able to palpate arterial pulse.  No hematoma or erythema noted. Non-tender   Neurological: She is alert and oriented to person, place, and time.  Skin: Skin is warm and dry.  Psychiatric: Judgment and thought content normal.     Assessment/Plan Clotted left upper extremity graft.  Patient unable to hold pressure after dialysis secondary to right arm weakness and they are using clamps to assist with coagulation post HD.  Patient informed of increased risk of clot with use of these.  She is aware of declot procedure, risks of infection, bleeding, damage to vessels and possibility of unsuccessful declot necessitating HD catheter placement.  Patient verbalized her understanding of the above, written consent obtained.  Plan for declot later this am.    CAMPBELL,PAMELA D 03/15/2011, 9:36 AM

## 2011-03-15 NOTE — ED Notes (Signed)
[name] PA aware of CBG 76. Patient does not want any sedation.

## 2011-03-15 NOTE — ED Notes (Signed)
D. Zeyfang aware of Se Potassium of 4.7. Can go to dialysis tomorrow. Patient aware. Patient does not want sedation.

## 2011-03-15 NOTE — ED Notes (Signed)
Back to sinus rhythm. Feels fine and dizziness resolved.

## 2011-03-15 NOTE — ED Notes (Signed)
Discharge instructions reviewed. VS stable Upper left arm dressing has scant sang. Stain and lower left arm dressing is clean dry and intact. Bruit present. Tolerated crackers and fluids.

## 2011-03-15 NOTE — ED Notes (Addendum)
Had some long pauses with heart rate decreased to 30's for about 30 secs and one pause approx. 6 seconds and patient did c/o being dizzy. Radial pulse correlated with pauses and heart rate. Dr. Grace Isaac aware. Now back in NSR 60's

## 2011-03-15 NOTE — ED Notes (Signed)
CBG 76. Patient denies any weakness, shakiness, or any symptoms.

## 2011-03-15 NOTE — ED Notes (Signed)
Discharged in w/c with RN to be driven home by husband.

## 2011-03-15 NOTE — Procedures (Signed)
Technically successful declot of left upper arm dialysis graft.  No immediate post procedural complications.  

## 2011-03-31 DIAGNOSIS — E1129 Type 2 diabetes mellitus with other diabetic kidney complication: Secondary | ICD-10-CM | POA: Diagnosis not present

## 2011-04-08 DIAGNOSIS — N186 End stage renal disease: Secondary | ICD-10-CM | POA: Diagnosis not present

## 2011-04-09 DIAGNOSIS — D631 Anemia in chronic kidney disease: Secondary | ICD-10-CM | POA: Diagnosis not present

## 2011-04-09 DIAGNOSIS — E119 Type 2 diabetes mellitus without complications: Secondary | ICD-10-CM | POA: Diagnosis not present

## 2011-04-09 DIAGNOSIS — N186 End stage renal disease: Secondary | ICD-10-CM | POA: Diagnosis not present

## 2011-04-09 DIAGNOSIS — N2581 Secondary hyperparathyroidism of renal origin: Secondary | ICD-10-CM | POA: Diagnosis not present

## 2011-05-05 DIAGNOSIS — N186 End stage renal disease: Secondary | ICD-10-CM | POA: Diagnosis not present

## 2011-05-05 DIAGNOSIS — D509 Iron deficiency anemia, unspecified: Secondary | ICD-10-CM | POA: Diagnosis not present

## 2011-05-05 DIAGNOSIS — N2581 Secondary hyperparathyroidism of renal origin: Secondary | ICD-10-CM | POA: Diagnosis not present

## 2011-05-06 DIAGNOSIS — N186 End stage renal disease: Secondary | ICD-10-CM | POA: Diagnosis not present

## 2011-05-07 DIAGNOSIS — E119 Type 2 diabetes mellitus without complications: Secondary | ICD-10-CM | POA: Diagnosis not present

## 2011-05-07 DIAGNOSIS — D509 Iron deficiency anemia, unspecified: Secondary | ICD-10-CM | POA: Diagnosis not present

## 2011-05-07 DIAGNOSIS — N2581 Secondary hyperparathyroidism of renal origin: Secondary | ICD-10-CM | POA: Diagnosis not present

## 2011-05-07 DIAGNOSIS — N186 End stage renal disease: Secondary | ICD-10-CM | POA: Diagnosis not present

## 2011-05-07 DIAGNOSIS — D631 Anemia in chronic kidney disease: Secondary | ICD-10-CM | POA: Diagnosis not present

## 2011-06-06 DIAGNOSIS — N186 End stage renal disease: Secondary | ICD-10-CM | POA: Diagnosis not present

## 2011-06-07 DIAGNOSIS — E119 Type 2 diabetes mellitus without complications: Secondary | ICD-10-CM | POA: Diagnosis not present

## 2011-06-07 DIAGNOSIS — D631 Anemia in chronic kidney disease: Secondary | ICD-10-CM | POA: Diagnosis not present

## 2011-06-07 DIAGNOSIS — D509 Iron deficiency anemia, unspecified: Secondary | ICD-10-CM | POA: Diagnosis not present

## 2011-06-07 DIAGNOSIS — N186 End stage renal disease: Secondary | ICD-10-CM | POA: Diagnosis not present

## 2011-06-07 DIAGNOSIS — N2581 Secondary hyperparathyroidism of renal origin: Secondary | ICD-10-CM | POA: Diagnosis not present

## 2011-06-10 ENCOUNTER — Encounter: Payer: Self-pay | Admitting: Internal Medicine

## 2011-06-21 ENCOUNTER — Other Ambulatory Visit (HOSPITAL_COMMUNITY): Payer: Self-pay | Admitting: Nephrology

## 2011-06-21 DIAGNOSIS — N186 End stage renal disease: Secondary | ICD-10-CM

## 2011-06-24 ENCOUNTER — Ambulatory Visit (HOSPITAL_COMMUNITY)
Admission: RE | Admit: 2011-06-24 | Discharge: 2011-06-24 | Disposition: A | Discharge status: Home / Self Care | Payer: Medicare Other | Source: Ambulatory Visit | Attending: Nephrology | Admitting: Nephrology

## 2011-06-24 ENCOUNTER — Other Ambulatory Visit (HOSPITAL_COMMUNITY): Payer: Self-pay | Admitting: Nephrology

## 2011-06-24 DIAGNOSIS — Z853 Personal history of malignant neoplasm of breast: Secondary | ICD-10-CM | POA: Diagnosis not present

## 2011-06-24 DIAGNOSIS — B1920 Unspecified viral hepatitis C without hepatic coma: Secondary | ICD-10-CM | POA: Diagnosis not present

## 2011-06-24 DIAGNOSIS — I871 Compression of vein: Secondary | ICD-10-CM | POA: Diagnosis not present

## 2011-06-24 DIAGNOSIS — E119 Type 2 diabetes mellitus without complications: Secondary | ICD-10-CM | POA: Insufficient documentation

## 2011-06-24 DIAGNOSIS — N186 End stage renal disease: Secondary | ICD-10-CM

## 2011-06-24 DIAGNOSIS — I2510 Atherosclerotic heart disease of native coronary artery without angina pectoris: Secondary | ICD-10-CM | POA: Insufficient documentation

## 2011-06-24 DIAGNOSIS — E785 Hyperlipidemia, unspecified: Secondary | ICD-10-CM | POA: Insufficient documentation

## 2011-06-24 DIAGNOSIS — T82898A Other specified complication of vascular prosthetic devices, implants and grafts, initial encounter: Secondary | ICD-10-CM | POA: Diagnosis not present

## 2011-06-24 DIAGNOSIS — D649 Anemia, unspecified: Secondary | ICD-10-CM | POA: Diagnosis not present

## 2011-06-24 DIAGNOSIS — I739 Peripheral vascular disease, unspecified: Secondary | ICD-10-CM | POA: Diagnosis not present

## 2011-06-24 MED ORDER — IOHEXOL 300 MG/ML  SOLN
100.0000 mL | Freq: Once | INTRAMUSCULAR | Status: AC | PRN
  Administered 2011-06-24: 50 mL via INTRAVENOUS

## 2011-06-24 NOTE — H&P (Signed)
Subjective:   Patient is a 78 y.o. female with hx of ESRD on HD.  Presents today for shuntogram revealing a stenosis at previously treated site.  Intend to angioplasty today with likelihood of stent placement to hopefully prevent clotting. Last declot performed 03/15/11.    Past Medical History  Diagnosis Date  . ESRD (end stage renal disease)   . DM (diabetes mellitus)   . Hepatitis C   . Anemia   . PVD (peripheral vascular disease)   . CAD (coronary artery disease)   . Breast cancer   . Hyperparathyroidism   . Dyslipidemia     Past Surgical History  Procedure Date  . Cataract rt eye   . Mastectomy  (left)   . Abdominal hysterectomy   . Arteriovenous graft placement   . Left median nerve decompression   . Cervical discectomy   . Toe amuputation- rt 1,2,3rd toes     No Known Allergies  History  Substance Use Topics  . Smoking status: Not on file  . Smokeless tobacco: Not on file  . Alcohol Use:     Review of Systems  Constitutional: Negative for fever and chills.  HENT: Positive for neck pain.  Eyes: Positive for blurred vision.  Respiratory: Negative.  Cardiovascular: Negative. Negative for chest pain and palpitations.  Musculoskeletal: Positive for myalgias and back pain.  Skin: Negative.  Neurological: Positive for sensory change and focal weakness.  Endo/Heme/Allergies: Bruises/bleeds easily.    Physical Exam  Constitutional: She is oriented to person, place, and time. She appears well-developed and well-nourished. No distress.  Cardiovascular: Normal rate, regular rhythm and normal heart sounds. Exam reveals no gallop and no friction rub.  No murmur heard.  Respiratory: Effort normal and breath sounds normal. No respiratory distress. She has no wheezes.  GI: Soft. Bowel sounds are normal.  Musculoskeletal:  Left upper arm graft with access in place from shuntogram.  No erythema or evidence of hematoma.   Neurological: She is alert and oriented to person,  place, and time.  Skin: Skin is warm and dry.  Psychiatric: Judgment and thought content normal.     Assessment:   Stenosis identified via shuntogram.  Plan:   Proceed with angioplasty and possible stent placement.  Patient has been talked with in regards to the above procedure and that stenting this area given multiple interventions to this area in the past would best benefit likely.  She is in agreement to proceed.  Written consent obtained.

## 2011-06-30 DIAGNOSIS — E1129 Type 2 diabetes mellitus with other diabetic kidney complication: Secondary | ICD-10-CM | POA: Diagnosis not present

## 2011-07-06 DIAGNOSIS — N186 End stage renal disease: Secondary | ICD-10-CM | POA: Diagnosis not present

## 2011-07-07 DIAGNOSIS — E119 Type 2 diabetes mellitus without complications: Secondary | ICD-10-CM | POA: Diagnosis not present

## 2011-07-07 DIAGNOSIS — N186 End stage renal disease: Secondary | ICD-10-CM | POA: Diagnosis not present

## 2011-07-07 DIAGNOSIS — N2581 Secondary hyperparathyroidism of renal origin: Secondary | ICD-10-CM | POA: Diagnosis not present

## 2011-07-07 DIAGNOSIS — D631 Anemia in chronic kidney disease: Secondary | ICD-10-CM | POA: Diagnosis not present

## 2011-07-07 DIAGNOSIS — D509 Iron deficiency anemia, unspecified: Secondary | ICD-10-CM | POA: Diagnosis not present

## 2011-08-06 DIAGNOSIS — N186 End stage renal disease: Secondary | ICD-10-CM | POA: Diagnosis not present

## 2011-08-09 DIAGNOSIS — D631 Anemia in chronic kidney disease: Secondary | ICD-10-CM | POA: Diagnosis not present

## 2011-08-09 DIAGNOSIS — N186 End stage renal disease: Secondary | ICD-10-CM | POA: Diagnosis not present

## 2011-08-09 DIAGNOSIS — D509 Iron deficiency anemia, unspecified: Secondary | ICD-10-CM | POA: Diagnosis not present

## 2011-08-09 DIAGNOSIS — E119 Type 2 diabetes mellitus without complications: Secondary | ICD-10-CM | POA: Diagnosis not present

## 2011-08-09 DIAGNOSIS — N2581 Secondary hyperparathyroidism of renal origin: Secondary | ICD-10-CM | POA: Diagnosis not present

## 2011-09-05 DIAGNOSIS — N186 End stage renal disease: Secondary | ICD-10-CM | POA: Diagnosis not present

## 2011-09-06 DIAGNOSIS — N2581 Secondary hyperparathyroidism of renal origin: Secondary | ICD-10-CM | POA: Diagnosis not present

## 2011-09-06 DIAGNOSIS — D509 Iron deficiency anemia, unspecified: Secondary | ICD-10-CM | POA: Diagnosis not present

## 2011-09-06 DIAGNOSIS — N186 End stage renal disease: Secondary | ICD-10-CM | POA: Diagnosis not present

## 2011-09-06 DIAGNOSIS — E119 Type 2 diabetes mellitus without complications: Secondary | ICD-10-CM | POA: Diagnosis not present

## 2011-09-29 DIAGNOSIS — E1129 Type 2 diabetes mellitus with other diabetic kidney complication: Secondary | ICD-10-CM | POA: Diagnosis not present

## 2011-10-04 ENCOUNTER — Other Ambulatory Visit (HOSPITAL_COMMUNITY): Payer: Self-pay | Admitting: Nephrology

## 2011-10-04 DIAGNOSIS — N186 End stage renal disease: Secondary | ICD-10-CM

## 2011-10-06 DIAGNOSIS — N186 End stage renal disease: Secondary | ICD-10-CM | POA: Diagnosis not present

## 2011-10-07 ENCOUNTER — Other Ambulatory Visit (HOSPITAL_COMMUNITY): Payer: Self-pay | Admitting: Nephrology

## 2011-10-07 ENCOUNTER — Ambulatory Visit (HOSPITAL_COMMUNITY)
Admission: RE | Admit: 2011-10-07 | Discharge: 2011-10-07 | Disposition: A | Discharge status: Home / Self Care | Payer: Medicare Other | Source: Ambulatory Visit | Attending: Nephrology | Admitting: Nephrology

## 2011-10-07 DIAGNOSIS — N186 End stage renal disease: Secondary | ICD-10-CM | POA: Diagnosis not present

## 2011-10-07 DIAGNOSIS — T82898A Other specified complication of vascular prosthetic devices, implants and grafts, initial encounter: Secondary | ICD-10-CM | POA: Diagnosis not present

## 2011-10-07 DIAGNOSIS — Y832 Surgical operation with anastomosis, bypass or graft as the cause of abnormal reaction of the patient, or of later complication, without mention of misadventure at the time of the procedure: Secondary | ICD-10-CM | POA: Insufficient documentation

## 2011-10-07 DIAGNOSIS — I871 Compression of vein: Secondary | ICD-10-CM | POA: Insufficient documentation

## 2011-10-07 DIAGNOSIS — E785 Hyperlipidemia, unspecified: Secondary | ICD-10-CM | POA: Insufficient documentation

## 2011-10-07 DIAGNOSIS — E119 Type 2 diabetes mellitus without complications: Secondary | ICD-10-CM | POA: Diagnosis not present

## 2011-10-07 DIAGNOSIS — Z853 Personal history of malignant neoplasm of breast: Secondary | ICD-10-CM | POA: Insufficient documentation

## 2011-10-07 DIAGNOSIS — I82290 Acute embolism and thrombosis of other thoracic veins: Secondary | ICD-10-CM | POA: Diagnosis not present

## 2011-10-07 MED ORDER — IOHEXOL 300 MG/ML  SOLN
150.0000 mL | Freq: Once | INTRAMUSCULAR | Status: AC | PRN
  Administered 2011-10-07: 110 mL via INTRAVENOUS

## 2011-10-07 NOTE — H&P (Signed)
[name] is an 78 y.o. female.   Chief Complaint: Poor dialysis flows. HPI: ESRD with left arm graft.  Poor flows and shuntogram today.  Shuntogram demonstrates recurrent venous stenosis in upper arm.  Patient has no complaints.  Past Medical History  Diagnosis Date  . ESRD (end stage renal disease)   . DM (diabetes mellitus)   . Hepatitis C   . Anemia   . PVD (peripheral vascular disease)   . CAD (coronary artery disease)   . Breast cancer   . Hyperparathyroidism   . Dyslipidemia     Past Surgical History  Procedure Date  . Cataract rt eye   . Mastectomy  (left)   . Abdominal hysterectomy   . Arteriovenous graft placement   . Left median nerve decompression   . Cervical discectomy   . Toe amuputation- rt 1,2,3rd toes     No family history on file. Social History:  does not have a smoking history on file. She does not have any smokeless tobacco history on file. Her alcohol and drug histories not on file.  Allergies: No Known Allergies  Medication List  As of 10/07/2011  9:41 AM   ASK your doctor about these medications         amLODipine 5 MG tablet   Commonly known as: NORVASC   Take 5 mg by mouth daily.      b complex-vitamin c-folic acid 0.8 MG Tabs   Take 0.8 mg by mouth at bedtime.      calcium acetate 667 MG capsule   Commonly known as: PHOSLO   Take 667 mg by mouth 3 (three) times daily with meals.      cinacalcet 30 MG tablet   Commonly known as: SENSIPAR   Take 30 mg by mouth daily.      cinacalcet 60 MG tablet   Commonly known as: SENSIPAR   Take 60 mg by mouth at bedtime.      clonazePAM 0.5 MG tablet   Commonly known as: KLONOPIN   Take 0.5 mg by mouth at bedtime as needed.      lanthanum 1000 MG chewable tablet   Commonly known as: FOSRENOL   Chew 1,000 mg by mouth 3 (three) times daily with meals.      metoprolol tartrate 25 MG tablet   Commonly known as: LOPRESSOR   Take 25 mg by mouth 2 (two) times daily.           Review of  Systems  Constitutional: Negative.   Respiratory: Negative.   Genitourinary: Negative.     Physical Exam  Cardiovascular: Normal rate, regular rhythm and normal heart sounds.   Respiratory: Effort normal and breath sounds normal.  GI: Soft.     Assessment/Plan ESRD with a left arm dialysis graft.  Shuntogram demonstrates recurrent venous stenosis in left upper arm vein.  Plan for PTA and possible stent placement to treat recurrent stenosis.  Procedure discussed with patient and informed consent obtained.  [name] 10/07/2011, 9:38 AM

## 2011-10-07 NOTE — Procedures (Signed)
Shuntogram demonstrated recurrent severe stenosis in left upper vein.  This area was treated with balloon angioplasty.  Follow-up venograms demonstrated recurrent stenosis and lumen irregularity.  This vein was treated with a covered metallic stent.  Central venous stenosis treated with 7 mm and 8 mm balloons.  No immediate complication.  See dictated Radiology report.

## 2011-10-08 DIAGNOSIS — D509 Iron deficiency anemia, unspecified: Secondary | ICD-10-CM | POA: Diagnosis not present

## 2011-10-08 DIAGNOSIS — N2581 Secondary hyperparathyroidism of renal origin: Secondary | ICD-10-CM | POA: Diagnosis not present

## 2011-10-08 DIAGNOSIS — N039 Chronic nephritic syndrome with unspecified morphologic changes: Secondary | ICD-10-CM | POA: Diagnosis not present

## 2011-10-08 DIAGNOSIS — N186 End stage renal disease: Secondary | ICD-10-CM | POA: Diagnosis not present

## 2011-10-08 DIAGNOSIS — E119 Type 2 diabetes mellitus without complications: Secondary | ICD-10-CM | POA: Diagnosis not present

## 2011-10-11 DIAGNOSIS — N186 End stage renal disease: Secondary | ICD-10-CM | POA: Diagnosis not present

## 2011-10-11 DIAGNOSIS — D509 Iron deficiency anemia, unspecified: Secondary | ICD-10-CM | POA: Diagnosis not present

## 2011-10-11 DIAGNOSIS — N2581 Secondary hyperparathyroidism of renal origin: Secondary | ICD-10-CM | POA: Diagnosis not present

## 2011-10-11 DIAGNOSIS — D631 Anemia in chronic kidney disease: Secondary | ICD-10-CM | POA: Diagnosis not present

## 2011-10-11 DIAGNOSIS — E119 Type 2 diabetes mellitus without complications: Secondary | ICD-10-CM | POA: Diagnosis not present

## 2011-10-13 ENCOUNTER — Other Ambulatory Visit (HOSPITAL_COMMUNITY): Payer: Self-pay | Admitting: Nephrology

## 2011-10-13 ENCOUNTER — Ambulatory Visit (HOSPITAL_COMMUNITY)
Admission: RE | Admit: 2011-10-13 | Discharge: 2011-10-13 | Disposition: A | Discharge status: Home / Self Care | Payer: Medicare Other | Source: Ambulatory Visit | Attending: Nephrology | Admitting: Nephrology

## 2011-10-13 ENCOUNTER — Encounter (HOSPITAL_COMMUNITY): Payer: Self-pay

## 2011-10-13 DIAGNOSIS — N186 End stage renal disease: Secondary | ICD-10-CM

## 2011-10-13 DIAGNOSIS — T82898A Other specified complication of vascular prosthetic devices, implants and grafts, initial encounter: Secondary | ICD-10-CM | POA: Diagnosis not present

## 2011-10-13 DIAGNOSIS — B1920 Unspecified viral hepatitis C without hepatic coma: Secondary | ICD-10-CM | POA: Diagnosis not present

## 2011-10-13 DIAGNOSIS — Z853 Personal history of malignant neoplasm of breast: Secondary | ICD-10-CM | POA: Diagnosis not present

## 2011-10-13 DIAGNOSIS — I742 Embolism and thrombosis of arteries of the upper extremities: Secondary | ICD-10-CM | POA: Diagnosis not present

## 2011-10-13 DIAGNOSIS — Z9889 Other specified postprocedural states: Secondary | ICD-10-CM | POA: Diagnosis not present

## 2011-10-13 DIAGNOSIS — E119 Type 2 diabetes mellitus without complications: Secondary | ICD-10-CM | POA: Diagnosis not present

## 2011-10-13 DIAGNOSIS — I871 Compression of vein: Secondary | ICD-10-CM | POA: Insufficient documentation

## 2011-10-13 DIAGNOSIS — Y832 Surgical operation with anastomosis, bypass or graft as the cause of abnormal reaction of the patient, or of later complication, without mention of misadventure at the time of the procedure: Secondary | ICD-10-CM | POA: Insufficient documentation

## 2011-10-13 MED ORDER — HEPARIN SODIUM (PORCINE) 1000 UNIT/ML IJ SOLN
INTRAMUSCULAR | Status: AC
  Administered 2011-10-13: 3000 [IU] via INTRAVENOUS
  Filled 2011-10-13: qty 1

## 2011-10-13 MED ORDER — FENTANYL CITRATE 0.05 MG/ML IJ SOLN
INTRAMUSCULAR | Status: AC | PRN
  Administered 2011-10-13 (×3): 25 ug via INTRAVENOUS

## 2011-10-13 MED ORDER — MIDAZOLAM HCL 2 MG/2ML IJ SOLN
INTRAMUSCULAR | Status: AC
  Filled 2011-10-13: qty 4

## 2011-10-13 MED ORDER — ALTEPLASE 2 MG IJ SOLR: 2.0000 mg | Freq: Once | INTRAMUSCULAR | Status: DC

## 2011-10-13 MED ORDER — IOHEXOL 300 MG/ML  SOLN
100.0000 mL | Freq: Once | INTRAMUSCULAR | Status: AC | PRN
  Administered 2011-10-13: 70 mL via INTRAVENOUS

## 2011-10-13 MED ORDER — ALTEPLASE 2 MG IJ SOLR
INTRAMUSCULAR | Status: AC | PRN
  Administered 2011-10-13: 2 mg

## 2011-10-13 MED ORDER — FENTANYL CITRATE 0.05 MG/ML IJ SOLN
INTRAMUSCULAR | Status: AC
  Filled 2011-10-13: qty 4

## 2011-10-13 MED ORDER — ALTEPLASE 2 MG IJ SOLR
2.0000 mg | Freq: Once | INTRAMUSCULAR | Status: DC
  Filled 2011-10-13: qty 2

## 2011-10-13 MED ORDER — MIDAZOLAM HCL 5 MG/5ML IJ SOLN
INTRAMUSCULAR | Status: AC | PRN
  Administered 2011-10-13: 2 mg via INTRAVENOUS

## 2011-10-13 MED ORDER — SODIUM CHLORIDE 0.9 % IV SOLN
INTRAVENOUS | Status: AC | PRN
  Administered 2011-10-13: 50 mL/h via INTRAVENOUS

## 2011-10-13 NOTE — Procedures (Signed)
Interventional Radiology Procedure Note  Procedure: Declot of LUE AVG, angioplasty of recurrent central (left brachiocephalic) venous stenosis and ultimately stenting of the central stenosis Complications: No immediate Recommendations:  - To dialysis tomorrow - Return to IR for any decreased flows during dialysis  Signed,  Sterling Big, MD Vascular & Interventional Radiologist [facility]

## 2011-10-13 NOTE — H&P (Signed)
[name] is an 78 y.o. female.   Chief Complaint: clotted LUA graft.   HPI: Patient states was working fine - HD center used clamps on her graft last treatment. Patient noted to be clotted at dialysis today. Recently had pta/stent on 10/07/11. Declots to this access in past on 01/27/11, 02/1011, and 03/15/11.  Past Medical History  Diagnosis Date  . ESRD (end stage renal disease)   . DM (diabetes mellitus)   . Hepatitis C   . Anemia   . PVD (peripheral vascular disease)   . CAD (coronary artery disease)   . Breast cancer   . Hyperparathyroidism   . Dyslipidemia     Past Surgical History  Procedure Date  . Cataract rt eye   . Mastectomy  (left)   . Abdominal hysterectomy   . Arteriovenous graft placement 03/20/09    revised 04/18/09  . Left median nerve decompression   . Cervical discectomy   . Toe amuputation- rt 1,2,3rd toes   . Dg av dialysis graft declot or lua graft    01/27/11, 02/15/11, 03/15/11, 10/13/11    Social History:  does not have a smoking history on file. She does not have any smokeless tobacco history on file. Her alcohol and drug histories not on file.  Allergies: No Known Allergies  Current outpatient prescriptions:amLODipine (NORVASC) 5 MG tablet, Take 5 mg by mouth daily.  , Disp: , Rfl: ;  b complex-vitamin c-folic acid (NEPHRO-VITE) 0.8 MG TABS, Take 0.8 mg by mouth at bedtime.  , Disp: , Rfl: ;  calcium acetate (PHOSLO) 667 MG capsule, Take 667 mg by mouth 3 (three) times daily with meals.  , Disp: , Rfl: ;  cinacalcet (SENSIPAR) 30 MG tablet, Take 30 mg by mouth daily. , Disp: , Rfl:  cinacalcet (SENSIPAR) 60 MG tablet, Take 60 mg by mouth at bedtime.  , Disp: , Rfl: ;  clonazePAM (KLONOPIN) 0.5 MG tablet, Take 0.5 mg by mouth at bedtime as needed.  , Disp: , Rfl: ;  lanthanum (FOSRENOL) 1000 MG chewable tablet, Chew 1,000 mg by mouth 3 (three) times daily with meals.  , Disp: , Rfl: ;  metoprolol tartrate (LOPRESSOR) 25 MG tablet, Take 25 mg by mouth 2 (two)  times daily.  , Disp: , Rfl:  Current facility-administered medications:alteplase (CATHFLO ACTIVASE) injection 8 mg, 8 mg, Intracatheter, Once, Malachy Moan, MD;  fentaNYL (SUBLIMAZE) 0.05 MG/ML injection, , , , ;  heparin 1000 UNIT/ML injection, , , , ;  midazolam (VERSED) 2 MG/2ML injection, , , , ;  DISCONTD: alteplase (CATHFLO ACTIVASE) injection 2 mg, 2 mg, Intracatheter, Once, Malachy Moan, MD DISCONTD: alteplase (CATHFLO ACTIVASE) injection 2 mg, 2 mg, Intracatheter, Once, Malachy Moan, MD    Review of Systems  Constitutional: Negative for fever and chills.  Respiratory: Negative.   Cardiovascular: Negative.   Gastrointestinal: Negative for nausea, vomiting and abdominal pain.  Skin: Negative.   Neurological: Positive for focal weakness. Negative for dizziness, seizures and loss of consciousness.  Psychiatric/Behavioral: Negative for depression. The patient is not nervous/anxious.     Blood pressure 172/57, pulse 67, temperature 98.6 F (37 C), resp. rate 16, SpO2 98.00%. Physical Exam  Constitutional: She is oriented to person, place, and time.       Thin elderly not distressed    HENT:  Head: Normocephalic and atraumatic.  Eyes: Pupils are equal, round, and reactive to light.  Cardiovascular: Normal rate and normal heart sounds.  Exam reveals no gallop and no  friction rub.   No murmur heard. Respiratory: Effort normal and breath sounds normal.  GI: Soft.  Musculoskeletal:       LUA graft without palpable thrill.  Left hand with edema  Neurological: She is alert and oriented to person, place, and time.  Skin: Skin is warm and dry.  Psychiatric: She has a normal mood and affect. Her behavior is normal. Judgment and thought content normal.    LABS :Results for WILLIAM, LASKE (MRN [id number]) as of 10/13/2011 10:56  Ref. Range 10/13/2011 10:16  Potassium Latest Range: 3.5-5.1 mEq/L 4.2      Assessment/Plan Patient has been talked with reviewing declot  procedure details and risks including but not limited to infection, bleeding, vessel damage, contrast issues and unsuccessful declot requiring HD catheter placement with her apparent understanding. Written consent obtained.   CAMPBELL,PAMELA D 10/13/2011, 10:51 AM

## 2011-10-13 NOTE — H&P (Signed)
Agree with above.  Pt had recent shuntogram with PTA and stenting for decreased flows and now her graft is thrombosed.  Possibly due to recurrent central stenosis, vs an issue with her stent.  Will proceed with declot and additional intervention as needed.  Signed,  Sterling Big, MD Vascular & Interventional Radiologist [facility]

## 2011-10-14 ENCOUNTER — Telehealth (HOSPITAL_COMMUNITY): Payer: Self-pay | Admitting: *Deleted

## 2011-10-14 DIAGNOSIS — E119 Type 2 diabetes mellitus without complications: Secondary | ICD-10-CM | POA: Diagnosis not present

## 2011-10-14 DIAGNOSIS — N2581 Secondary hyperparathyroidism of renal origin: Secondary | ICD-10-CM | POA: Diagnosis not present

## 2011-10-14 DIAGNOSIS — D631 Anemia in chronic kidney disease: Secondary | ICD-10-CM | POA: Diagnosis not present

## 2011-10-14 DIAGNOSIS — D509 Iron deficiency anemia, unspecified: Secondary | ICD-10-CM | POA: Diagnosis not present

## 2011-10-14 DIAGNOSIS — N186 End stage renal disease: Secondary | ICD-10-CM | POA: Diagnosis not present

## 2011-10-14 NOTE — Telephone Encounter (Signed)
Post op radiology phone call attempted, no answer on home or cell phone.

## 2011-10-15 DIAGNOSIS — N186 End stage renal disease: Secondary | ICD-10-CM | POA: Diagnosis not present

## 2011-10-15 DIAGNOSIS — N039 Chronic nephritic syndrome with unspecified morphologic changes: Secondary | ICD-10-CM | POA: Diagnosis not present

## 2011-10-15 DIAGNOSIS — N2581 Secondary hyperparathyroidism of renal origin: Secondary | ICD-10-CM | POA: Diagnosis not present

## 2011-10-15 DIAGNOSIS — D631 Anemia in chronic kidney disease: Secondary | ICD-10-CM | POA: Diagnosis not present

## 2011-10-15 DIAGNOSIS — D509 Iron deficiency anemia, unspecified: Secondary | ICD-10-CM | POA: Diagnosis not present

## 2011-10-15 DIAGNOSIS — E119 Type 2 diabetes mellitus without complications: Secondary | ICD-10-CM | POA: Diagnosis not present

## 2011-10-18 DIAGNOSIS — N186 End stage renal disease: Secondary | ICD-10-CM | POA: Diagnosis not present

## 2011-10-18 DIAGNOSIS — N2581 Secondary hyperparathyroidism of renal origin: Secondary | ICD-10-CM | POA: Diagnosis not present

## 2011-10-18 DIAGNOSIS — N039 Chronic nephritic syndrome with unspecified morphologic changes: Secondary | ICD-10-CM | POA: Diagnosis not present

## 2011-10-18 DIAGNOSIS — D509 Iron deficiency anemia, unspecified: Secondary | ICD-10-CM | POA: Diagnosis not present

## 2011-10-18 DIAGNOSIS — E119 Type 2 diabetes mellitus without complications: Secondary | ICD-10-CM | POA: Diagnosis not present

## 2011-10-20 DIAGNOSIS — N186 End stage renal disease: Secondary | ICD-10-CM | POA: Diagnosis not present

## 2011-10-20 DIAGNOSIS — D509 Iron deficiency anemia, unspecified: Secondary | ICD-10-CM | POA: Diagnosis not present

## 2011-10-20 DIAGNOSIS — N2581 Secondary hyperparathyroidism of renal origin: Secondary | ICD-10-CM | POA: Diagnosis not present

## 2011-10-20 DIAGNOSIS — D631 Anemia in chronic kidney disease: Secondary | ICD-10-CM | POA: Diagnosis not present

## 2011-10-20 DIAGNOSIS — E119 Type 2 diabetes mellitus without complications: Secondary | ICD-10-CM | POA: Diagnosis not present

## 2011-10-22 DIAGNOSIS — N186 End stage renal disease: Secondary | ICD-10-CM | POA: Diagnosis not present

## 2011-10-22 DIAGNOSIS — E119 Type 2 diabetes mellitus without complications: Secondary | ICD-10-CM | POA: Diagnosis not present

## 2011-10-22 DIAGNOSIS — D509 Iron deficiency anemia, unspecified: Secondary | ICD-10-CM | POA: Diagnosis not present

## 2011-10-22 DIAGNOSIS — D631 Anemia in chronic kidney disease: Secondary | ICD-10-CM | POA: Diagnosis not present

## 2011-10-22 DIAGNOSIS — N2581 Secondary hyperparathyroidism of renal origin: Secondary | ICD-10-CM | POA: Diagnosis not present

## 2011-10-25 DIAGNOSIS — E119 Type 2 diabetes mellitus without complications: Secondary | ICD-10-CM | POA: Diagnosis not present

## 2011-10-25 DIAGNOSIS — N186 End stage renal disease: Secondary | ICD-10-CM | POA: Diagnosis not present

## 2011-10-25 DIAGNOSIS — D631 Anemia in chronic kidney disease: Secondary | ICD-10-CM | POA: Diagnosis not present

## 2011-10-25 DIAGNOSIS — N2581 Secondary hyperparathyroidism of renal origin: Secondary | ICD-10-CM | POA: Diagnosis not present

## 2011-10-25 DIAGNOSIS — D509 Iron deficiency anemia, unspecified: Secondary | ICD-10-CM | POA: Diagnosis not present

## 2011-10-27 DIAGNOSIS — N186 End stage renal disease: Secondary | ICD-10-CM | POA: Diagnosis not present

## 2011-10-27 DIAGNOSIS — D509 Iron deficiency anemia, unspecified: Secondary | ICD-10-CM | POA: Diagnosis not present

## 2011-10-27 DIAGNOSIS — N2581 Secondary hyperparathyroidism of renal origin: Secondary | ICD-10-CM | POA: Diagnosis not present

## 2011-10-27 DIAGNOSIS — N039 Chronic nephritic syndrome with unspecified morphologic changes: Secondary | ICD-10-CM | POA: Diagnosis not present

## 2011-10-27 DIAGNOSIS — E119 Type 2 diabetes mellitus without complications: Secondary | ICD-10-CM | POA: Diagnosis not present

## 2011-10-29 DIAGNOSIS — N2581 Secondary hyperparathyroidism of renal origin: Secondary | ICD-10-CM | POA: Diagnosis not present

## 2011-10-29 DIAGNOSIS — N186 End stage renal disease: Secondary | ICD-10-CM | POA: Diagnosis not present

## 2011-10-29 DIAGNOSIS — N039 Chronic nephritic syndrome with unspecified morphologic changes: Secondary | ICD-10-CM | POA: Diagnosis not present

## 2011-10-29 DIAGNOSIS — E119 Type 2 diabetes mellitus without complications: Secondary | ICD-10-CM | POA: Diagnosis not present

## 2011-10-29 DIAGNOSIS — D509 Iron deficiency anemia, unspecified: Secondary | ICD-10-CM | POA: Diagnosis not present

## 2011-11-01 DIAGNOSIS — E119 Type 2 diabetes mellitus without complications: Secondary | ICD-10-CM | POA: Diagnosis not present

## 2011-11-01 DIAGNOSIS — N039 Chronic nephritic syndrome with unspecified morphologic changes: Secondary | ICD-10-CM | POA: Diagnosis not present

## 2011-11-01 DIAGNOSIS — N2581 Secondary hyperparathyroidism of renal origin: Secondary | ICD-10-CM | POA: Diagnosis not present

## 2011-11-01 DIAGNOSIS — D509 Iron deficiency anemia, unspecified: Secondary | ICD-10-CM | POA: Diagnosis not present

## 2011-11-01 DIAGNOSIS — N186 End stage renal disease: Secondary | ICD-10-CM | POA: Diagnosis not present

## 2011-11-03 DIAGNOSIS — D631 Anemia in chronic kidney disease: Secondary | ICD-10-CM | POA: Diagnosis not present

## 2011-11-03 DIAGNOSIS — D509 Iron deficiency anemia, unspecified: Secondary | ICD-10-CM | POA: Diagnosis not present

## 2011-11-03 DIAGNOSIS — N186 End stage renal disease: Secondary | ICD-10-CM | POA: Diagnosis not present

## 2011-11-03 DIAGNOSIS — E119 Type 2 diabetes mellitus without complications: Secondary | ICD-10-CM | POA: Diagnosis not present

## 2011-11-03 DIAGNOSIS — N2581 Secondary hyperparathyroidism of renal origin: Secondary | ICD-10-CM | POA: Diagnosis not present

## 2011-11-05 DIAGNOSIS — N186 End stage renal disease: Secondary | ICD-10-CM | POA: Diagnosis not present

## 2011-11-05 DIAGNOSIS — N2581 Secondary hyperparathyroidism of renal origin: Secondary | ICD-10-CM | POA: Diagnosis not present

## 2011-11-05 DIAGNOSIS — D631 Anemia in chronic kidney disease: Secondary | ICD-10-CM | POA: Diagnosis not present

## 2011-11-05 DIAGNOSIS — E119 Type 2 diabetes mellitus without complications: Secondary | ICD-10-CM | POA: Diagnosis not present

## 2011-11-05 DIAGNOSIS — D509 Iron deficiency anemia, unspecified: Secondary | ICD-10-CM | POA: Diagnosis not present

## 2011-11-06 DIAGNOSIS — N186 End stage renal disease: Secondary | ICD-10-CM | POA: Diagnosis not present

## 2011-11-08 DIAGNOSIS — E119 Type 2 diabetes mellitus without complications: Secondary | ICD-10-CM | POA: Diagnosis not present

## 2011-11-08 DIAGNOSIS — N186 End stage renal disease: Secondary | ICD-10-CM | POA: Diagnosis not present

## 2011-11-08 DIAGNOSIS — D631 Anemia in chronic kidney disease: Secondary | ICD-10-CM | POA: Diagnosis not present

## 2011-11-08 DIAGNOSIS — Z23 Encounter for immunization: Secondary | ICD-10-CM | POA: Diagnosis not present

## 2011-11-08 DIAGNOSIS — D509 Iron deficiency anemia, unspecified: Secondary | ICD-10-CM | POA: Diagnosis not present

## 2011-11-08 DIAGNOSIS — N2581 Secondary hyperparathyroidism of renal origin: Secondary | ICD-10-CM | POA: Diagnosis not present

## 2011-12-06 DIAGNOSIS — N186 End stage renal disease: Secondary | ICD-10-CM | POA: Diagnosis not present

## 2011-12-08 DIAGNOSIS — N2581 Secondary hyperparathyroidism of renal origin: Secondary | ICD-10-CM | POA: Diagnosis not present

## 2011-12-08 DIAGNOSIS — D509 Iron deficiency anemia, unspecified: Secondary | ICD-10-CM | POA: Diagnosis not present

## 2011-12-08 DIAGNOSIS — E119 Type 2 diabetes mellitus without complications: Secondary | ICD-10-CM | POA: Diagnosis not present

## 2011-12-08 DIAGNOSIS — N186 End stage renal disease: Secondary | ICD-10-CM | POA: Diagnosis not present

## 2011-12-10 DIAGNOSIS — N186 End stage renal disease: Secondary | ICD-10-CM | POA: Diagnosis not present

## 2011-12-10 DIAGNOSIS — N2581 Secondary hyperparathyroidism of renal origin: Secondary | ICD-10-CM | POA: Diagnosis not present

## 2011-12-10 DIAGNOSIS — E119 Type 2 diabetes mellitus without complications: Secondary | ICD-10-CM | POA: Diagnosis not present

## 2011-12-10 DIAGNOSIS — D509 Iron deficiency anemia, unspecified: Secondary | ICD-10-CM | POA: Diagnosis not present

## 2011-12-13 DIAGNOSIS — N2581 Secondary hyperparathyroidism of renal origin: Secondary | ICD-10-CM | POA: Diagnosis not present

## 2011-12-13 DIAGNOSIS — D509 Iron deficiency anemia, unspecified: Secondary | ICD-10-CM | POA: Diagnosis not present

## 2011-12-13 DIAGNOSIS — E119 Type 2 diabetes mellitus without complications: Secondary | ICD-10-CM | POA: Diagnosis not present

## 2011-12-13 DIAGNOSIS — N186 End stage renal disease: Secondary | ICD-10-CM | POA: Diagnosis not present

## 2011-12-15 DIAGNOSIS — E119 Type 2 diabetes mellitus without complications: Secondary | ICD-10-CM | POA: Diagnosis not present

## 2011-12-15 DIAGNOSIS — N2581 Secondary hyperparathyroidism of renal origin: Secondary | ICD-10-CM | POA: Diagnosis not present

## 2011-12-15 DIAGNOSIS — D509 Iron deficiency anemia, unspecified: Secondary | ICD-10-CM | POA: Diagnosis not present

## 2011-12-15 DIAGNOSIS — N186 End stage renal disease: Secondary | ICD-10-CM | POA: Diagnosis not present

## 2011-12-17 DIAGNOSIS — N2581 Secondary hyperparathyroidism of renal origin: Secondary | ICD-10-CM | POA: Diagnosis not present

## 2011-12-17 DIAGNOSIS — N186 End stage renal disease: Secondary | ICD-10-CM | POA: Diagnosis not present

## 2011-12-17 DIAGNOSIS — D509 Iron deficiency anemia, unspecified: Secondary | ICD-10-CM | POA: Diagnosis not present

## 2011-12-17 DIAGNOSIS — E119 Type 2 diabetes mellitus without complications: Secondary | ICD-10-CM | POA: Diagnosis not present

## 2011-12-20 DIAGNOSIS — D509 Iron deficiency anemia, unspecified: Secondary | ICD-10-CM | POA: Diagnosis not present

## 2011-12-20 DIAGNOSIS — E119 Type 2 diabetes mellitus without complications: Secondary | ICD-10-CM | POA: Diagnosis not present

## 2011-12-20 DIAGNOSIS — N186 End stage renal disease: Secondary | ICD-10-CM | POA: Diagnosis not present

## 2011-12-20 DIAGNOSIS — N2581 Secondary hyperparathyroidism of renal origin: Secondary | ICD-10-CM | POA: Diagnosis not present

## 2011-12-22 DIAGNOSIS — N2581 Secondary hyperparathyroidism of renal origin: Secondary | ICD-10-CM | POA: Diagnosis not present

## 2011-12-22 DIAGNOSIS — D509 Iron deficiency anemia, unspecified: Secondary | ICD-10-CM | POA: Diagnosis not present

## 2011-12-22 DIAGNOSIS — E119 Type 2 diabetes mellitus without complications: Secondary | ICD-10-CM | POA: Diagnosis not present

## 2011-12-22 DIAGNOSIS — N186 End stage renal disease: Secondary | ICD-10-CM | POA: Diagnosis not present

## 2011-12-24 DIAGNOSIS — D509 Iron deficiency anemia, unspecified: Secondary | ICD-10-CM | POA: Diagnosis not present

## 2011-12-24 DIAGNOSIS — N186 End stage renal disease: Secondary | ICD-10-CM | POA: Diagnosis not present

## 2011-12-24 DIAGNOSIS — E119 Type 2 diabetes mellitus without complications: Secondary | ICD-10-CM | POA: Diagnosis not present

## 2011-12-24 DIAGNOSIS — N2581 Secondary hyperparathyroidism of renal origin: Secondary | ICD-10-CM | POA: Diagnosis not present

## 2011-12-27 DIAGNOSIS — N186 End stage renal disease: Secondary | ICD-10-CM | POA: Diagnosis not present

## 2011-12-27 DIAGNOSIS — E119 Type 2 diabetes mellitus without complications: Secondary | ICD-10-CM | POA: Diagnosis not present

## 2011-12-27 DIAGNOSIS — N2581 Secondary hyperparathyroidism of renal origin: Secondary | ICD-10-CM | POA: Diagnosis not present

## 2011-12-27 DIAGNOSIS — D509 Iron deficiency anemia, unspecified: Secondary | ICD-10-CM | POA: Diagnosis not present

## 2011-12-29 DIAGNOSIS — E1129 Type 2 diabetes mellitus with other diabetic kidney complication: Secondary | ICD-10-CM | POA: Diagnosis not present

## 2011-12-29 DIAGNOSIS — N186 End stage renal disease: Secondary | ICD-10-CM | POA: Diagnosis not present

## 2011-12-29 DIAGNOSIS — D509 Iron deficiency anemia, unspecified: Secondary | ICD-10-CM | POA: Diagnosis not present

## 2011-12-29 DIAGNOSIS — N2581 Secondary hyperparathyroidism of renal origin: Secondary | ICD-10-CM | POA: Diagnosis not present

## 2011-12-29 DIAGNOSIS — E119 Type 2 diabetes mellitus without complications: Secondary | ICD-10-CM | POA: Diagnosis not present

## 2011-12-31 DIAGNOSIS — N2581 Secondary hyperparathyroidism of renal origin: Secondary | ICD-10-CM | POA: Diagnosis not present

## 2011-12-31 DIAGNOSIS — N186 End stage renal disease: Secondary | ICD-10-CM | POA: Diagnosis not present

## 2011-12-31 DIAGNOSIS — E119 Type 2 diabetes mellitus without complications: Secondary | ICD-10-CM | POA: Diagnosis not present

## 2011-12-31 DIAGNOSIS — D509 Iron deficiency anemia, unspecified: Secondary | ICD-10-CM | POA: Diagnosis not present

## 2012-01-03 DIAGNOSIS — E119 Type 2 diabetes mellitus without complications: Secondary | ICD-10-CM | POA: Diagnosis not present

## 2012-01-03 DIAGNOSIS — N186 End stage renal disease: Secondary | ICD-10-CM | POA: Diagnosis not present

## 2012-01-03 DIAGNOSIS — D509 Iron deficiency anemia, unspecified: Secondary | ICD-10-CM | POA: Diagnosis not present

## 2012-01-03 DIAGNOSIS — N2581 Secondary hyperparathyroidism of renal origin: Secondary | ICD-10-CM | POA: Diagnosis not present

## 2012-01-05 DIAGNOSIS — E119 Type 2 diabetes mellitus without complications: Secondary | ICD-10-CM | POA: Diagnosis not present

## 2012-01-05 DIAGNOSIS — N2581 Secondary hyperparathyroidism of renal origin: Secondary | ICD-10-CM | POA: Diagnosis not present

## 2012-01-05 DIAGNOSIS — D509 Iron deficiency anemia, unspecified: Secondary | ICD-10-CM | POA: Diagnosis not present

## 2012-01-05 DIAGNOSIS — N186 End stage renal disease: Secondary | ICD-10-CM | POA: Diagnosis not present

## 2012-01-06 DIAGNOSIS — N186 End stage renal disease: Secondary | ICD-10-CM | POA: Diagnosis not present

## 2012-01-07 DIAGNOSIS — D509 Iron deficiency anemia, unspecified: Secondary | ICD-10-CM | POA: Diagnosis not present

## 2012-01-07 DIAGNOSIS — E119 Type 2 diabetes mellitus without complications: Secondary | ICD-10-CM | POA: Diagnosis not present

## 2012-01-07 DIAGNOSIS — D631 Anemia in chronic kidney disease: Secondary | ICD-10-CM | POA: Diagnosis not present

## 2012-01-07 DIAGNOSIS — N186 End stage renal disease: Secondary | ICD-10-CM | POA: Diagnosis not present

## 2012-01-07 DIAGNOSIS — N2581 Secondary hyperparathyroidism of renal origin: Secondary | ICD-10-CM | POA: Diagnosis not present

## 2012-02-05 DIAGNOSIS — N186 End stage renal disease: Secondary | ICD-10-CM | POA: Diagnosis not present

## 2012-02-07 DIAGNOSIS — N2581 Secondary hyperparathyroidism of renal origin: Secondary | ICD-10-CM | POA: Diagnosis not present

## 2012-02-07 DIAGNOSIS — D631 Anemia in chronic kidney disease: Secondary | ICD-10-CM | POA: Diagnosis not present

## 2012-02-07 DIAGNOSIS — D509 Iron deficiency anemia, unspecified: Secondary | ICD-10-CM | POA: Diagnosis not present

## 2012-02-07 DIAGNOSIS — N186 End stage renal disease: Secondary | ICD-10-CM | POA: Diagnosis not present

## 2012-02-27 ENCOUNTER — Encounter (HOSPITAL_COMMUNITY): Payer: Self-pay

## 2012-02-27 ENCOUNTER — Emergency Department (INDEPENDENT_AMBULATORY_CARE_PROVIDER_SITE_OTHER)
Admission: EM | Admit: 2012-02-27 | Discharge: 2012-02-27 | Disposition: A | Discharge status: Home / Self Care | Payer: Medicare Other | Source: Home / Self Care

## 2012-02-27 DIAGNOSIS — M25569 Pain in unspecified knee: Secondary | ICD-10-CM | POA: Diagnosis not present

## 2012-02-27 DIAGNOSIS — M25559 Pain in unspecified hip: Secondary | ICD-10-CM

## 2012-02-27 DIAGNOSIS — M549 Dorsalgia, unspecified: Secondary | ICD-10-CM | POA: Diagnosis not present

## 2012-02-27 DIAGNOSIS — Z992 Dependence on renal dialysis: Secondary | ICD-10-CM

## 2012-02-27 DIAGNOSIS — G8929 Other chronic pain: Secondary | ICD-10-CM

## 2012-02-27 DIAGNOSIS — N186 End stage renal disease: Secondary | ICD-10-CM

## 2012-02-27 NOTE — ED Provider Notes (Signed)
History     CSN: [id number]  Arrival date & time 02/27/12  1129   None     Chief Complaint  Patient presents with  . Weight Loss    (Consider location/radiation/quality/duration/timing/severity/associated sxs/prior treatment) HPI Comments: 78 year old female brought in by younger significant other believe to be her daughter for weight loss of 52 pounds. She has end-stage renal disease and is currently on dialysis. She also has several other chronic complaints including back pain which is chronic but worse in the past 4-5 days. She has a history of back surgery and degenerative disc disease, she also has pain in her legs and knees. Her most recent complaint is that of a dry cough. She has various aches and pains of her joints. She denies fever, shortness of breath or malaise. Her medical history current and past is significant for ESRD, type 2 diabetes, hepatitis C, anemia, DV D, CAD breast cancer, hyperparathyroidism, and dyslipidemia. Spite the above diagnoses she appears generally well today there are no acute findings and does not appear toxic or acutely ill. After talking to the nurse at the dialysis center she has not had a weight loss of 52 pounds her dry weight is only changed one kilogram up and down over the past 2 months.   Past Medical History  Diagnosis Date  . ESRD (end stage renal disease)   . DM (diabetes mellitus)   . Hepatitis C   . Anemia   . PVD (peripheral vascular disease)   . CAD (coronary artery disease)   . Breast cancer   . Hyperparathyroidism   . Dyslipidemia     Past Surgical History  Procedure Date  . Cataract rt eye   . Mastectomy  (left)   . Abdominal hysterectomy   . Arteriovenous graft placement 03/20/09    revised 04/18/09  . Left median nerve decompression   . Cervical discectomy   . Toe amuputation- rt 1,2,3rd toes   . Dg av dialysis graft declot or lua graft    01/27/11, 02/15/11, 03/15/11, 10/13/11    History reviewed. No pertinent  family history.  History  Substance Use Topics  . Smoking status: Not on file  . Smokeless tobacco: Not on file  . Alcohol Use:     OB History    Grav Para Term Preterm Abortions TAB SAB Ect Mult Living                  Review of Systems  All other systems reviewed and are negative.    Allergies  Review of patient's allergies indicates no known allergies.  Home Medications   Current Outpatient Rx  Name  Route  Sig  Dispense  Refill  . AMLODIPINE BESYLATE 5 MG PO TABS   Oral   Take 5 mg by mouth daily.           Marland Kitchen NEPHRO-VITE 0.8 MG PO TABS   Oral   Take 0.8 mg by mouth at bedtime.           Marland Kitchen CALCIUM ACETATE 667 MG PO CAPS   Oral   Take 667 mg by mouth 3 (three) times daily with meals.           Marland Kitchen CINACALCET HCL 30 MG PO TABS   Oral   Take 30 mg by mouth daily.          Marland Kitchen CINACALCET HCL 60 MG PO TABS   Oral   Take 60 mg by mouth at bedtime.           Marland Kitchen  CLONAZEPAM 0.5 MG PO TABS   Oral   Take 0.5 mg by mouth at bedtime as needed.           Marland Kitchen LANTHANUM CARBONATE 1000 MG PO CHEW   Oral   Chew 1,000 mg by mouth 3 (three) times daily with meals.           Marland Kitchen METOPROLOL TARTRATE 25 MG PO TABS   Oral   Take 25 mg by mouth 2 (two) times daily.             BP 189/72  Pulse 66  Temp 98.8 F (37.1 C) (Oral)  Resp 18  SpO2 99%  Physical Exam  Nursing note and vitals reviewed. Constitutional: She is oriented to person, place, and time. No distress.       Alert, talkative, she is in the bed drinking a Sprite.  HENT:  Mouth/Throat: Oropharynx is clear and moist. No oropharyngeal exudate.  Eyes: EOM are normal.  Neck: Normal range of motion. Neck supple.  Cardiovascular:       S1, S2 present, however the implanted fistula produces a loud bruit sound in the left chest which obscures most of the heart sounds.  Pulmonary/Chest: Effort normal and breath sounds normal. No respiratory distress. She has no wheezes. She has no rales.  Abdominal:  Soft. There is no tenderness.  Musculoskeletal: She exhibits no edema.       Generalized weakness due to debility. We had to have someone help set her up to auscultate lungs. That she is not strong enough to stand for a chest x-ray but this is not a new problem.  Lymphadenopathy:    She has no cervical adenopathy.  Neurological: She is alert and oriented to person, place, and time.  Skin: Skin is warm and dry.  Psychiatric: She has a normal mood and affect.    ED Course  Procedures (including critical care time)  Labs Reviewed - No data to display No results found.   1. ESRD (end stage renal disease) on dialysis   2. Chronic arthralgias of knees and hips   3. Chronic back pain greater than 3 months duration       MDM  I explained to the patient and her daughter that at this time the patient is stable and not exhibiting any need or acute symptoms other than a dry cough. Her lungs are clear and she is not showing symptoms of infections. She has energetic speech, drinking fluids well she is oriented and cognitively intact. As above the chief complaint of weight loss less to be in some understanding as this information was clarified by the nurse at dialysis center. She will be given the health to neck number to call to establish with internal medicine or a PCP. She has multiple chronic complaints that just cannot be addressed in the urgent care center. She will be discharged in stable condition.        Hayden Rasmussen, NP 02/27/12 1335  Hayden Rasmussen, NP 02/27/12 [phone]

## 2012-02-27 NOTE — ED Notes (Addendum)
Multiple issues. Reported 50 lb weight loss in 3 days per dialysis clinic, both knees swollen, painful, sent here by dialysis clinic

## 2012-02-27 NOTE — ED Provider Notes (Signed)
Medical screening examination/treatment/procedure(s) were performed by non-physician practitioner and as supervising physician I was immediately available for consultation/collaboration.  Leslee Home, M.D.   Reuben Likes, MD 02/27/12 [phone]

## 2012-03-07 DIAGNOSIS — N186 End stage renal disease: Secondary | ICD-10-CM | POA: Diagnosis not present

## 2012-03-10 DIAGNOSIS — Z992 Dependence on renal dialysis: Secondary | ICD-10-CM | POA: Diagnosis not present

## 2012-03-10 DIAGNOSIS — D631 Anemia in chronic kidney disease: Secondary | ICD-10-CM | POA: Diagnosis not present

## 2012-03-10 DIAGNOSIS — D509 Iron deficiency anemia, unspecified: Secondary | ICD-10-CM | POA: Diagnosis not present

## 2012-03-10 DIAGNOSIS — N2581 Secondary hyperparathyroidism of renal origin: Secondary | ICD-10-CM | POA: Diagnosis not present

## 2012-03-10 DIAGNOSIS — N186 End stage renal disease: Secondary | ICD-10-CM | POA: Diagnosis not present

## 2012-03-13 ENCOUNTER — Other Ambulatory Visit (HOSPITAL_COMMUNITY): Payer: Self-pay | Admitting: Nephrology

## 2012-03-13 DIAGNOSIS — N186 End stage renal disease: Secondary | ICD-10-CM

## 2012-03-21 ENCOUNTER — Other Ambulatory Visit (HOSPITAL_COMMUNITY): Payer: Self-pay | Admitting: Nephrology

## 2012-03-21 ENCOUNTER — Encounter (HOSPITAL_COMMUNITY): Payer: Self-pay

## 2012-03-21 ENCOUNTER — Ambulatory Visit (HOSPITAL_COMMUNITY)
Admission: RE | Admit: 2012-03-21 | Discharge: 2012-03-21 | Disposition: A | Discharge status: Home / Self Care | Payer: Medicare Other | Source: Ambulatory Visit | Attending: Nephrology | Admitting: Nephrology

## 2012-03-21 DIAGNOSIS — N186 End stage renal disease: Secondary | ICD-10-CM | POA: Diagnosis not present

## 2012-03-21 DIAGNOSIS — Y832 Surgical operation with anastomosis, bypass or graft as the cause of abnormal reaction of the patient, or of later complication, without mention of misadventure at the time of the procedure: Secondary | ICD-10-CM | POA: Insufficient documentation

## 2012-03-21 DIAGNOSIS — E119 Type 2 diabetes mellitus without complications: Secondary | ICD-10-CM | POA: Diagnosis not present

## 2012-03-21 DIAGNOSIS — T82898A Other specified complication of vascular prosthetic devices, implants and grafts, initial encounter: Secondary | ICD-10-CM | POA: Diagnosis not present

## 2012-03-21 MED ORDER — IOHEXOL 300 MG/ML  SOLN
100.0000 mL | Freq: Once | INTRAMUSCULAR | Status: AC | PRN
  Administered 2012-03-21: 60 mL via INTRAVENOUS

## 2012-03-21 NOTE — Procedures (Signed)
Procedure:  Left arm AV shuntogram with venous angioplasty x 2 Findings:  Recurrent venous outflow stenoses and central venous stenoses of subclavian and brachiocephalic veins.  Treated with 8 mm and 10 mm angioplasty with good result.

## 2012-03-21 NOTE — H&P (Signed)
Agree 

## 2012-03-21 NOTE — H&P (Signed)
HPI: [name] is an 79 y.o. female with ESRD who is referred for shuntogram and possible intervention of her (L)UE AVG as she has been having access issues at HD. She has had prior PTA and declot procedures of this graft, most recently in 8/13. She is familiar with procedure. No recent changes to PMHx or meds. Last HD yesterday  Past Medical History:  Past Medical History  Diagnosis Date  . ESRD (end stage renal disease)   . DM (diabetes mellitus)   . Hepatitis C   . Anemia   . PVD (peripheral vascular disease)   . CAD (coronary artery disease)   . Breast cancer   . Hyperparathyroidism   . Dyslipidemia     Past Surgical History:  Past Surgical History  Procedure Date  . Cataract rt eye   . Mastectomy  (left)   . Abdominal hysterectomy   . Arteriovenous graft placement 03/20/09    revised 04/18/09  . Left median nerve decompression   . Cervical discectomy   . Toe amuputation- rt 1,2,3rd toes   . Dg av dialysis graft declot or lua graft    01/27/11, 02/15/11, 03/15/11, 10/13/11    Family History: No family history on file.  Social History:  does not have a smoking history on file. She does not have any smokeless tobacco history on file. Her alcohol and drug histories not on file.  Allergies: No Known Allergies  Medications: Home Medications    Current Outpatient Rx   Name   Route   Sig   Dispense   Refill   .  AMLODIPINE BESYLATE 5 MG PO TABS   Oral   Take 5 mg by mouth daily.       Marland Kitchen  NEPHRO-VITE 0.8 MG PO TABS   Oral   Take 0.8 mg by mouth at bedtime.       Marland Kitchen  CALCIUM ACETATE 667 MG PO CAPS   Oral   Take 667 mg by mouth 3 (three) times daily with meals.       Marland Kitchen  CINACALCET HCL 30 MG PO TABS   Oral   Take 30 mg by mouth daily.       Marland Kitchen  CINACALCET HCL 60 MG PO TABS   Oral   Take 60 mg by mouth at bedtime.       Marland Kitchen  CLONAZEPAM 0.5 MG PO TABS   Oral   Take 0.5 mg by mouth at bedtime as needed.       Marland Kitchen  LANTHANUM CARBONATE 1000 MG PO CHEW   Oral   Chew 1,000 mg by mouth  3 (three) times daily with meals.       Marland Kitchen  METOPROLOL TARTRATE 25 MG PO TABS   Oral   Take 25 mg by mouth 2 (two) times daily.          Please HPI for pertinent positives, otherwise complete 10 system ROS negative.  Physical Exam: There were no vitals taken for this visit. There is no height or weight on file to calculate BMI.   General Appearance:  Alert, cooperative, no distress, appears stated age  Head:  Normocephalic, without obvious abnormality, atraumatic  ENT: Unremarkable  Neck: Supple, symmetrical, trachea midline, no adenopathy, thyroid: not enlarged, symmetric, no tenderness/mass/nodules  Lungs:   Clear to auscultation bilaterally, no w/r/r, respirations unlabored without use of accessory muscles.  Heart:  Regular rate and rhythm, S1, S2 normal, no murmur, rub or gallop. Carotids 2+ without bruit.  Abdomen:   Soft, non-tender, non distended. Bowel sounds active all four quadrants,  no masses, no organomegaly.  Extremities: (L)UE with palpable AVG, hand warm  Neurologic: Normal affect, no gross deficits.   No results found for this or any previous visit (from the past 48 hour(s)). No results found.  Assessment/Plan Recurrent stenosis of (L)UE AVG For shuntogram and angioplasty today Reviewed procedure including risks complications. Consent signed in chart  Brayton El PA-C 03/21/2012, 8:55 AM

## 2012-03-23 DIAGNOSIS — N186 End stage renal disease: Secondary | ICD-10-CM | POA: Diagnosis not present

## 2012-03-23 DIAGNOSIS — IMO0001 Reserved for inherently not codable concepts without codable children: Secondary | ICD-10-CM | POA: Diagnosis not present

## 2012-03-23 DIAGNOSIS — N039 Chronic nephritic syndrome with unspecified morphologic changes: Secondary | ICD-10-CM | POA: Diagnosis not present

## 2012-03-23 DIAGNOSIS — D631 Anemia in chronic kidney disease: Secondary | ICD-10-CM | POA: Diagnosis not present

## 2012-03-23 DIAGNOSIS — R262 Difficulty in walking, not elsewhere classified: Secondary | ICD-10-CM | POA: Diagnosis not present

## 2012-03-23 DIAGNOSIS — E1129 Type 2 diabetes mellitus with other diabetic kidney complication: Secondary | ICD-10-CM | POA: Diagnosis not present

## 2012-03-24 ENCOUNTER — Encounter (HOSPITAL_COMMUNITY): Payer: Self-pay | Admitting: Emergency Medicine

## 2012-03-24 ENCOUNTER — Emergency Department (HOSPITAL_COMMUNITY): Payer: Medicare Other

## 2012-03-24 ENCOUNTER — Emergency Department (HOSPITAL_COMMUNITY)
Admission: EM | Admit: 2012-03-24 | Discharge: 2012-03-24 | Disposition: A | Discharge status: Home / Self Care | Payer: Medicare Other | Attending: Emergency Medicine | Admitting: Emergency Medicine

## 2012-03-24 DIAGNOSIS — Z79899 Other long term (current) drug therapy: Secondary | ICD-10-CM | POA: Diagnosis not present

## 2012-03-24 DIAGNOSIS — Z862 Personal history of diseases of the blood and blood-forming organs and certain disorders involving the immune mechanism: Secondary | ICD-10-CM | POA: Insufficient documentation

## 2012-03-24 DIAGNOSIS — I2510 Atherosclerotic heart disease of native coronary artery without angina pectoris: Secondary | ICD-10-CM | POA: Insufficient documentation

## 2012-03-24 DIAGNOSIS — Z853 Personal history of malignant neoplasm of breast: Secondary | ICD-10-CM | POA: Insufficient documentation

## 2012-03-24 DIAGNOSIS — Z8719 Personal history of other diseases of the digestive system: Secondary | ICD-10-CM | POA: Insufficient documentation

## 2012-03-24 DIAGNOSIS — I739 Peripheral vascular disease, unspecified: Secondary | ICD-10-CM | POA: Diagnosis not present

## 2012-03-24 DIAGNOSIS — E213 Hyperparathyroidism, unspecified: Secondary | ICD-10-CM | POA: Insufficient documentation

## 2012-03-24 DIAGNOSIS — K5900 Constipation, unspecified: Secondary | ICD-10-CM | POA: Diagnosis not present

## 2012-03-24 DIAGNOSIS — R109 Unspecified abdominal pain: Secondary | ICD-10-CM | POA: Diagnosis not present

## 2012-03-24 DIAGNOSIS — E119 Type 2 diabetes mellitus without complications: Secondary | ICD-10-CM | POA: Diagnosis not present

## 2012-03-24 DIAGNOSIS — N289 Disorder of kidney and ureter, unspecified: Secondary | ICD-10-CM | POA: Diagnosis not present

## 2012-03-24 DIAGNOSIS — N186 End stage renal disease: Secondary | ICD-10-CM | POA: Diagnosis not present

## 2012-03-24 LAB — CBC WITH DIFFERENTIAL/PLATELET
Basophils Absolute: 0 10*3/uL (ref 0.0–0.1)
Basophils Relative: 0 % (ref 0–1)
Eosinophils Absolute: 0.1 10*3/uL (ref 0.0–0.7)
HCT: 32 % — ABNORMAL LOW (ref 36.0–46.0)
MCH: 30.8 pg (ref 26.0–34.0)
MCHC: 33.1 g/dL (ref 30.0–36.0)
Monocytes Absolute: 0.4 10*3/uL (ref 0.1–1.0)
Neutro Abs: 3.3 10*3/uL (ref 1.7–7.7)
Neutrophils Relative %: 63 % (ref 43–77)
RDW: 13.9 % (ref 11.5–15.5)

## 2012-03-24 LAB — BASIC METABOLIC PANEL
Calcium: 9.4 mg/dL (ref 8.4–10.5)
Chloride: 89 mEq/L — ABNORMAL LOW (ref 96–112)
Creatinine, Ser: 5.82 mg/dL — ABNORMAL HIGH (ref 0.50–1.10)
GFR calc Af Amer: 7 mL/min — ABNORMAL LOW (ref >90)
GFR calc non Af Amer: 6 mL/min — ABNORMAL LOW (ref >90)

## 2012-03-24 LAB — OCCULT BLOOD, POC DEVICE: Fecal Occult Bld: NEGATIVE

## 2012-03-24 MED ORDER — FLEET ENEMA 7-19 GM/118ML RE ENEM
1.0000 | ENEMA | Freq: Once | RECTAL | Status: DC
  Filled 2012-03-24: qty 1

## 2012-03-24 MED ORDER — ONDANSETRON HCL 4 MG/2ML IJ SOLN
4.0000 mg | Freq: Once | INTRAMUSCULAR | Status: AC
  Administered 2012-03-24: 4 mg via INTRAVENOUS
  Filled 2012-03-24: qty 2

## 2012-03-24 MED ORDER — IOHEXOL 300 MG/ML  SOLN
50.0000 mL | Freq: Once | INTRAMUSCULAR | Status: AC | PRN
  Administered 2012-03-24: 50 mL via ORAL

## 2012-03-24 MED ORDER — IOHEXOL 300 MG/ML  SOLN: 100.0000 mL | Freq: Once | INTRAMUSCULAR | Status: DC | PRN

## 2012-03-24 MED ORDER — MORPHINE SULFATE 4 MG/ML IJ SOLN
4.0000 mg | Freq: Once | INTRAMUSCULAR | Status: AC
  Administered 2012-03-24: 4 mg via INTRAVENOUS
  Filled 2012-03-24: qty 1

## 2012-03-24 MED ORDER — POLYETHYLENE GLYCOL 3350 17 GM/SCOOP PO POWD: 17.0000 g | Freq: Every day | ORAL | Status: DC

## 2012-03-24 NOTE — ED Notes (Signed)
Pt c/o constipation for the last week and a half. Pt states she has been passing small amounts. Pt c/o of lower abd pain

## 2012-03-24 NOTE — ED Notes (Signed)
Patient transported to X-ray 

## 2012-03-24 NOTE — ED Notes (Signed)
Patient transported to CT 

## 2012-03-24 NOTE — ED Notes (Signed)
In and out performed with no urine return. PA notified.

## 2012-03-24 NOTE — ED Provider Notes (Signed)
History     CSN: [id number]  Arrival date & time 03/24/12  0549   First MD Initiated Contact with Patient 03/24/12 [phone]      Chief Complaint  Patient presents with  . Constipation    (Consider location/radiation/quality/duration/timing/severity/associated sxs/prior treatment) HPI  79 year old female with history of end-stage renal disease with a recent Left arm AV shuntogram with venous angioplasty x 2 on 03/21/12 presents c/o abd pain and constipation.  Patient reports she was having pain to her low back several weeks ago and her doctor prescribed "arthritis medication". She has been taking the medication which has helped her back, but for the past 2 weeks she is having constipation. States she felt the urge to go but only able to produce a small amount each time. She only had 2 normal bowel movement in the past 2 weeks. She is able to pass flatus. Since this morning she is having increased abdominal pain. Describe pain as a crampy sensation to her left lower quadrant, nonradiating, lasting for seconds to minutes, nothing seems to make it better or worse. She denies fever, chills, nausea, vomiting, chest pain, shortness of breath, back pain, dysuria. Patient does make urine. Patient has had prior abdominal surgery. She is a Monday/Wednesday/Friday dialysis, last dialysis 2 days ago.   Past Medical History  Diagnosis Date  . ESRD (end stage renal disease)   . DM (diabetes mellitus)   . Hepatitis C   . Anemia   . PVD (peripheral vascular disease)   . CAD (coronary artery disease)   . Breast cancer   . Hyperparathyroidism   . Dyslipidemia     Past Surgical History  Procedure Date  . Cataract rt eye   . Mastectomy  (left)   . Abdominal hysterectomy   . Arteriovenous graft placement 03/20/09    revised 04/18/09  . Left median nerve decompression   . Cervical discectomy   . Toe amuputation- rt 1,2,3rd toes   . Dg av dialysis graft declot or lua graft    01/27/11, 02/15/11, 03/15/11,  10/13/11    History reviewed. No pertinent family history.  History  Substance Use Topics  . Smoking status: Not on file  . Smokeless tobacco: Not on file  . Alcohol Use:     OB History    Grav Para Term Preterm Abortions TAB SAB Ect Mult Living                  Review of Systems  Constitutional:       10 Systems reviewed and all are negative for acute change except as noted in the HPI.     Allergies  Review of patient's allergies indicates no known allergies.  Home Medications   Current Outpatient Rx  Name  Route  Sig  Dispense  Refill  . AMLODIPINE BESYLATE 5 MG PO TABS   Oral   Take 5 mg by mouth daily.           Marland Kitchen NEPHRO-VITE 0.8 MG PO TABS   Oral   Take 0.8 mg by mouth at bedtime.           Marland Kitchen CALCIUM ACETATE 667 MG PO CAPS   Oral   Take 667 mg by mouth 3 (three) times daily with meals.           Marland Kitchen CINACALCET HCL 30 MG PO TABS   Oral   Take 30 mg by mouth daily.          Marland Kitchen CINACALCET  HCL 60 MG PO TABS   Oral   Take 60 mg by mouth at bedtime.           Marland Kitchen CLONAZEPAM 0.5 MG PO TABS   Oral   Take 0.5 mg by mouth at bedtime as needed. For anxiety         . LANTHANUM CARBONATE 1000 MG PO CHEW   Oral   Chew 1,000 mg by mouth 3 (three) times daily with meals.           Marland Kitchen METOPROLOL TARTRATE 25 MG PO TABS   Oral   Take 25 mg by mouth 2 (two) times daily.             BP 190/54  Pulse 68  Temp 99.5 F (37.5 C) (Oral)  Resp 14  SpO2 100%  Physical Exam  Nursing note and vitals reviewed. Constitutional: She is oriented to person, place, and time. She appears well-developed and well-nourished. No distress.       Awake, alert, nontoxic appearance  HENT:  Head: Atraumatic.  Mouth/Throat: Oropharynx is clear and moist.  Eyes: Conjunctivae normal are normal. Right eye exhibits no discharge. Left eye exhibits no discharge.  Neck: Neck supple.  Cardiovascular: Normal rate and regular rhythm.   Murmur (4/6 systolic murmur best heard at  2nd intercostal space left chest, radiates to back) heard. Pulmonary/Chest: Effort normal. No respiratory distress. She has no wheezes. She exhibits no tenderness.  Abdominal: Soft. Bowel sounds are normal. She exhibits distension. There is tenderness (tenderness to LLQ without guarding or rebound tenderness.  no hernia noted.  Midline abd surgical scar.). There is no rebound.  Genitourinary:       Chaperone present:  Normal rectal tone, normal soft stool in rectal vault, no overt bleeding or mass.    Musculoskeletal: She exhibits no tenderness.       ROM appears intact, no obvious focal weakness  Neurological: She is alert and oriented to person, place, and time.       Mental status and motor strength appears intact  Skin: No rash noted.  Psychiatric: She has a normal mood and affect.    ED Course  Procedures (including critical care time)  Results for orders placed during the hospital encounter of 03/24/12  CBC WITH DIFFERENTIAL      Component Value Range   WBC 5.2  4.0 - 10.5 K/uL   RBC 3.44 (*) 3.87 - 5.11 MIL/uL   Hemoglobin 10.6 (*) 12.0 - 15.0 g/dL   HCT 16.1 (*) 09.6 - 04.5 %   MCV 93.0  78.0 - 100.0 fL   MCH 30.8  26.0 - 34.0 pg   MCHC 33.1  30.0 - 36.0 g/dL   RDW 40.9  81.1 - 91.4 %   Platelets 179  150 - 400 K/uL   Neutrophils Relative 63  43 - 77 %   Neutro Abs 3.3  1.7 - 7.7 K/uL   Lymphocytes Relative 27  12 - 46 %   Lymphs Abs 1.4  0.7 - 4.0 K/uL   Monocytes Relative 8  3 - 12 %   Monocytes Absolute 0.4  0.1 - 1.0 K/uL   Eosinophils Relative 1  0 - 5 %   Eosinophils Absolute 0.1  0.0 - 0.7 K/uL   Basophils Relative 0  0 - 1 %   Basophils Absolute 0.0  0.0 - 0.1 K/uL  BASIC METABOLIC PANEL      Component Value Range   Sodium 136  135 -  145 mEq/L   Potassium 3.9  3.5 - 5.1 mEq/L   Chloride 89 (*) 96 - 112 mEq/L   CO2 34 (*) 19 - 32 mEq/L   Glucose, Bld 116 (*) 70 - 99 mg/dL   BUN 28 (*) 6 - 23 mg/dL   Creatinine, Ser 4.09 (*) 0.50 - 1.10 mg/dL   Calcium  9.4  8.4 - 81.1 mg/dL   GFR calc non Af Amer 6 (*) >90 mL/min   GFR calc Af Amer 7 (*) >90 mL/min  OCCULT BLOOD, POC DEVICE      Component Value Range   Fecal Occult Bld NEGATIVE  NEGATIVE   Ct Abdomen Pelvis Wo Contrast  03/24/2012  *RADIOLOGY REPORT*  Clinical Data: Abdominal pain.  CT ABDOMEN AND PELVIS WITHOUT CONTRAST  Technique:  Multidetector CT imaging of the abdomen and pelvis was performed following the standard protocol without intravenous contrast.  Comparison: Abdominal series 03/24/2012  Findings: 5 mm linear pleural based density at the left lung base probably represents scarring or atelectasis.  3 mm nodular density at the left lung base on sequence #3, image 7.  There is no evidence for free intraperitoneal air.  Unenhanced CT was performed per clinician order.  Lack of IV contrast limits sensitivity and specificity, especially for evaluation of abdominal/pelvic solid viscera.  No gross abnormality to the liver, gallbladder, spleen, pancreas or adrenal glands.  Both kidneys are atrophic and consistent with end stage renal disease.  Fullness along the lower pole of the right kidney could be associated with a parapelvic cyst but difficult to exclude a parenchymal lesion.  Subtle low density areas within kidneys most likely associated with cysts.  The aorta and visceral vessels are heavily calcified.  There is a massive amount of stool in the rectum and lower sigmoid colon.  There is stool throughout the colon.  High density fluid in the urinary bladder.  No significant small bowel dilatation.  A small amount of free fluid in the right lower quadrant.  There is mild anterolisthesis at L4-L5.  Diffuse osteopenia of the bones. There is oral contrast within the distal esophagus, stomach and small bowel.  IMPRESSION: Massive amount of stool in the sigmoid colon and rectum.  Findings are compatible with history of constipation. A small amount of free fluid in the right lower quadrant.  Low density  areas and nodularity in the kidneys, particularly in the right kidney lower pole.  Findings are probably associated with atrophy and cystic changes.  However, a subtle lesion in the right lower pole cannot be excluded based on these images.  This area could be further evaluated with ultrasound or postcontrast CT of the kidneys.  High density fluid within the urinary bladder.  Punctate small nodular densities at the lung bases are nonspecific. Findings could be postinflammatory. If the patient is at high risk for bronchogenic carcinoma, follow-up chest CT at 6-12 months is recommended.  If the patient is at low risk for bronchogenic carcinoma, follow-up chest CT at 12 months is recommended.  This recommendation follows the consensus statement: Guidelines for Management of Small Pulmonary Nodules Detected on CT Scans: A Statement from the Fleischner Society as published in Radiology 2005; 237:395-400.   Original Report Authenticated By: Richarda Overlie, M.D.    Ir Pta Venous Left  03/21/2012  *RADIOLOGY REPORT*  Clinical Data:  Prolonged bleeding after puncture of the left upper arm dialysis graft.  History of venous outflow stenoses treated with prior angioplasty and stent placement.  1.  DIALYSIS AV SHUNTOGRAM 2.  ANGIOPLASTY OF OUTFLOW LEFT AXILLARY VEIN 3.  CENTRAL VEIN ANGIOPLASTY OF THE LEFT SUBCLAVIAN VEIN AND LEFT INNOMINATE VEIN  Comparison:  10/13/2011  Contrast:  60 ml Omnipaque-300  Fluoroscopy Time: 4.6 minutes.  Procedure:  The procedure, risks, benefits, and alternatives were explained to the patient.  Questions regarding the procedure were encouraged and answered.  The patient understands and consents to the procedure.  The left arm dialysis graft was prepped with Betadine in a sterile fashion, and a sterile drape was applied covering the operative field.  A diagnostic shunt study was performed via an 18 gauge angiocatheter introduced into venous outflow.  Venous drainage was assessed to the level of the  central veins in the chest.  Proximal shunt was studied by reflux maneuver with temporary compression of venous outflow.  The angiocath was removed and replaced with a 6-French sheath over a guidewire.  Balloon angioplasty of the left axillary vein, left subclavian vein and left innominate vein was then performed with a 8 mm x 4 cm Conquest balloon.  Balloon angioplasty was followed by additional angiography.  Additional angioplasty was performed of the left subclavian vein and innominate vein with a 10 mm x 4 cm Mustang balloon. Additional angiography was performed.  The sheath was removed and hemostasis obtained with application of a 2-0 Ethilon pursestring suture.  Complications: None  Findings:  Recurrent and progressive venous outflow stenosis is identified involving several segments of the axillary vein beyond a preexisting venous outflow stent in the upper arm.  The left upper arm dialysis graft itself is normally patent including the arterial anastomosis.  Venous outflow stenosis at the level of the mid left subclavian vein at the level of prior venous occlusion approaches 80% in estimated caliber.  Focal stenosis noted at the central end of overlapping central venous stents in the left innominate vein. This focal stenosis was initially estimated to be approximately 60 - 70% in caliber.  The SVC is widely patent.  After 8 mm balloon angioplasty, the axillary vein shows significant improved patency with mild irregular stenosis remaining of approximately 15 - 20%.  Residual left subclavian vein stenosis present approaching 60% in estimated caliber after initial 8 mm angioplasty.  This showed further improvement after 10 mm balloon angioplasty with approximately 25 - 30% narrowing remaining. Central innominate vein stenosis also showed significant improvement after 8 mm and 10 mm balloon angioplasty with no significant stenosis identified.  IMPRESSION: Stenoses involving the left axillary vein, left subclavian  vein and central left innominate vein, as above.  The axillary venous stenoses were treated with 8 mm balloon angioplasty.  Subclavian and innominate vein stenoses were treated with both 8 mm and 10 mm balloon angioplasty.  Significantly improved venous patency was noted after balloon angioplasty.  Access Management: The graft and venous outflow remain amenable to percutaneous intervention.   Original Report Authenticated By: Irish Lack, M.D.    Ir Pta Venous Left  03/21/2012  *RADIOLOGY REPORT*  Clinical Data:  Prolonged bleeding after puncture of the left upper arm dialysis graft.  History of venous outflow stenoses treated with prior angioplasty and stent placement.  1.  DIALYSIS AV SHUNTOGRAM 2.  ANGIOPLASTY OF OUTFLOW LEFT AXILLARY VEIN 3.  CENTRAL VEIN ANGIOPLASTY OF THE LEFT SUBCLAVIAN VEIN AND LEFT INNOMINATE VEIN  Comparison:  10/13/2011  Contrast:  60 ml Omnipaque-300  Fluoroscopy Time: 4.6 minutes.  Procedure:  The procedure, risks, benefits, and alternatives were explained to the patient.  Questions regarding  the procedure were encouraged and answered.  The patient understands and consents to the procedure.  The left arm dialysis graft was prepped with Betadine in a sterile fashion, and a sterile drape was applied covering the operative field.  A diagnostic shunt study was performed via an 18 gauge angiocatheter introduced into venous outflow.  Venous drainage was assessed to the level of the central veins in the chest.  Proximal shunt was studied by reflux maneuver with temporary compression of venous outflow.  The angiocath was removed and replaced with a 6-French sheath over a guidewire.  Balloon angioplasty of the left axillary vein, left subclavian vein and left innominate vein was then performed with a 8 mm x 4 cm Conquest balloon.  Balloon angioplasty was followed by additional angiography.  Additional angioplasty was performed of the left subclavian vein and innominate vein with a 10 mm x  4 cm Mustang balloon. Additional angiography was performed.  The sheath was removed and hemostasis obtained with application of a 2-0 Ethilon pursestring suture.  Complications: None  Findings:  Recurrent and progressive venous outflow stenosis is identified involving several segments of the axillary vein beyond a preexisting venous outflow stent in the upper arm.  The left upper arm dialysis graft itself is normally patent including the arterial anastomosis.  Venous outflow stenosis at the level of the mid left subclavian vein at the level of prior venous occlusion approaches 80% in estimated caliber.  Focal stenosis noted at the central end of overlapping central venous stents in the left innominate vein. This focal stenosis was initially estimated to be approximately 60 - 70% in caliber.  The SVC is widely patent.  After 8 mm balloon angioplasty, the axillary vein shows significant improved patency with mild irregular stenosis remaining of approximately 15 - 20%.  Residual left subclavian vein stenosis present approaching 60% in estimated caliber after initial 8 mm angioplasty.  This showed further improvement after 10 mm balloon angioplasty with approximately 25 - 30% narrowing remaining. Central innominate vein stenosis also showed significant improvement after 8 mm and 10 mm balloon angioplasty with no significant stenosis identified.  IMPRESSION: Stenoses involving the left axillary vein, left subclavian vein and central left innominate vein, as above.  The axillary venous stenoses were treated with 8 mm balloon angioplasty.  Subclavian and innominate vein stenoses were treated with both 8 mm and 10 mm balloon angioplasty.  Significantly improved venous patency was noted after balloon angioplasty.  Access Management: The graft and venous outflow remain amenable to percutaneous intervention.   Original Report Authenticated By: Irish Lack, M.D.    Dg Abd Acute W/chest  03/24/2012  *RADIOLOGY REPORT*   Clinical Data: Constipation.  ACUTE ABDOMEN SERIES (ABDOMEN 2 VIEW & CHEST 1 VIEW)  Comparison: Chest radiograph 04/08/2009  Findings: Chest radiograph demonstrates clear lungs.  There is a vascular stent in the region of the left innominate vein.  Heart size is normal.  Surgical clips in the left upper chest and axillary region.  Abdominal images demonstrate a large amount of stool in the pelvis and rectal region.  There is gas and stool throughout the colon and no evidence to suggest free intraperitoneal air.  IMPRESSION: Large amount of stool in the pelvis and rectal region.  Findings are suggestive for constipation.  No acute chest findings.   Original Report Authenticated By: Richarda Overlie, M.D.    Ir Shuntogram/ Fistulagram Left Mod Sed  03/21/2012  *RADIOLOGY REPORT*  Clinical Data:  Prolonged bleeding after puncture of the  left upper arm dialysis graft.  History of venous outflow stenoses treated with prior angioplasty and stent placement.  1.  DIALYSIS AV SHUNTOGRAM 2.  ANGIOPLASTY OF OUTFLOW LEFT AXILLARY VEIN 3.  CENTRAL VEIN ANGIOPLASTY OF THE LEFT SUBCLAVIAN VEIN AND LEFT INNOMINATE VEIN  Comparison:  10/13/2011  Contrast:  60 ml Omnipaque-300  Fluoroscopy Time: 4.6 minutes.  Procedure:  The procedure, risks, benefits, and alternatives were explained to the patient.  Questions regarding the procedure were encouraged and answered.  The patient understands and consents to the procedure.  The left arm dialysis graft was prepped with Betadine in a sterile fashion, and a sterile drape was applied covering the operative field.  A diagnostic shunt study was performed via an 18 gauge angiocatheter introduced into venous outflow.  Venous drainage was assessed to the level of the central veins in the chest.  Proximal shunt was studied by reflux maneuver with temporary compression of venous outflow.  The angiocath was removed and replaced with a 6-French sheath over a guidewire.  Balloon angioplasty of the left  axillary vein, left subclavian vein and left innominate vein was then performed with a 8 mm x 4 cm Conquest balloon.  Balloon angioplasty was followed by additional angiography.  Additional angioplasty was performed of the left subclavian vein and innominate vein with a 10 mm x 4 cm Mustang balloon. Additional angiography was performed.  The sheath was removed and hemostasis obtained with application of a 2-0 Ethilon pursestring suture.  Complications: None  Findings:  Recurrent and progressive venous outflow stenosis is identified involving several segments of the axillary vein beyond a preexisting venous outflow stent in the upper arm.  The left upper arm dialysis graft itself is normally patent including the arterial anastomosis.  Venous outflow stenosis at the level of the mid left subclavian vein at the level of prior venous occlusion approaches 80% in estimated caliber.  Focal stenosis noted at the central end of overlapping central venous stents in the left innominate vein. This focal stenosis was initially estimated to be approximately 60 - 70% in caliber.  The SVC is widely patent.  After 8 mm balloon angioplasty, the axillary vein shows significant improved patency with mild irregular stenosis remaining of approximately 15 - 20%.  Residual left subclavian vein stenosis present approaching 60% in estimated caliber after initial 8 mm angioplasty.  This showed further improvement after 10 mm balloon angioplasty with approximately 25 - 30% narrowing remaining. Central innominate vein stenosis also showed significant improvement after 8 mm and 10 mm balloon angioplasty with no significant stenosis identified.  IMPRESSION: Stenoses involving the left axillary vein, left subclavian vein and central left innominate vein, as above.  The axillary venous stenoses were treated with 8 mm balloon angioplasty.  Subclavian and innominate vein stenoses were treated with both 8 mm and 10 mm balloon angioplasty.   Significantly improved venous patency was noted after balloon angioplasty.  Access Management: The graft and venous outflow remain amenable to percutaneous intervention.   Original Report Authenticated By: Irish Lack, M.D.     1. Constipation 2. Abnormal findings on CT scan  MDM  Pt presents with constipation and abdominal pain x 2 weeks.  No N/V.  abd tender on palpation, has abd surgery in the past.  Is a M/W/F dialysis pt.  Will obtain acute abdominal series follows with abd CT for further evaluation.  Will check UA, CXR, and basic labs.  Pt otherwise in NAD, mentating well.  Care discussed with attending.  8:00 AM Acute abdominal series shows nonobstructive bowel gas pattern, and moderate stool burden suggestive of constipation.  However will obtain abd/pelvis CT with oral contrast for further evaluation due to age.    Dialysis Center: [facility])  10:47 AM CT shows evidence of constipation without other acute pathology. There are low density areas and nodularity in the kidney, as well as nodular densities in the lung base.  Results were discussed with pt with recommendation to f/u with her PCP for close follow up.    Pt was given fleet enema and bowel care.  She's able to move her bowel twice so far.  Pt felt much better.    12:01 PM I have consulted with nephrologist, Dr. Allena Katz, to schedule dialysis appointment for pt since she missed her appointment today.  Dr. Allena Katz gave me number to call dialysis center.    Multiple attempts to contact dialysis center ( [phone] to Claud Kelp, PA-C)to schedule appointment for patient without success.  I will instruct pt and family member to call their dialysis center today for further management.  Pt stable for discharge.  Has had 4 successful BM here in ER.  Recommend avoid "arthritis medicine" in the mean time to decrease risk of constipation.  High-fiber diet encourage.    BP 132/54  Pulse 67  Temp 99.5 F  (37.5 C) (Oral)  Resp 14  SpO2 100%  I have reviewed nursing notes and vital signs. I personally reviewed the imaging tests through PACS system  I reviewed available ER/hospitalization records thought the EMR   Fayrene Helper, PA-C 03/24/12 1206  Fayrene Helper, PA-C 03/28/12 1407

## 2012-03-27 DIAGNOSIS — IMO0001 Reserved for inherently not codable concepts without codable children: Secondary | ICD-10-CM | POA: Diagnosis not present

## 2012-03-27 DIAGNOSIS — N186 End stage renal disease: Secondary | ICD-10-CM | POA: Diagnosis not present

## 2012-03-27 DIAGNOSIS — D631 Anemia in chronic kidney disease: Secondary | ICD-10-CM | POA: Diagnosis not present

## 2012-03-27 DIAGNOSIS — R262 Difficulty in walking, not elsewhere classified: Secondary | ICD-10-CM | POA: Diagnosis not present

## 2012-03-27 DIAGNOSIS — E1129 Type 2 diabetes mellitus with other diabetic kidney complication: Secondary | ICD-10-CM | POA: Diagnosis not present

## 2012-03-29 DIAGNOSIS — E1129 Type 2 diabetes mellitus with other diabetic kidney complication: Secondary | ICD-10-CM | POA: Diagnosis not present

## 2012-03-30 DIAGNOSIS — R262 Difficulty in walking, not elsewhere classified: Secondary | ICD-10-CM | POA: Diagnosis not present

## 2012-03-30 DIAGNOSIS — N186 End stage renal disease: Secondary | ICD-10-CM | POA: Diagnosis not present

## 2012-03-30 DIAGNOSIS — E1129 Type 2 diabetes mellitus with other diabetic kidney complication: Secondary | ICD-10-CM | POA: Diagnosis not present

## 2012-03-30 DIAGNOSIS — N039 Chronic nephritic syndrome with unspecified morphologic changes: Secondary | ICD-10-CM | POA: Diagnosis not present

## 2012-03-30 DIAGNOSIS — IMO0001 Reserved for inherently not codable concepts without codable children: Secondary | ICD-10-CM | POA: Diagnosis not present

## 2012-03-31 NOTE — ED Provider Notes (Signed)
Medical screening examination/treatment/procedure(s) were conducted as a shared visit with non-physician practitioner(s) and myself.  I personally evaluated the patient during the encounter   RRR CTAB NABS  Awaiting CT scan  Quintus Premo K Brenyn Petrey-Rasch, MD 03/31/12 2301

## 2012-04-04 DIAGNOSIS — N186 End stage renal disease: Secondary | ICD-10-CM | POA: Diagnosis not present

## 2012-04-04 DIAGNOSIS — I10 Essential (primary) hypertension: Secondary | ICD-10-CM | POA: Diagnosis not present

## 2012-04-04 DIAGNOSIS — Z1331 Encounter for screening for depression: Secondary | ICD-10-CM | POA: Diagnosis not present

## 2012-04-04 DIAGNOSIS — E119 Type 2 diabetes mellitus without complications: Secondary | ICD-10-CM | POA: Diagnosis not present

## 2012-04-04 DIAGNOSIS — M25569 Pain in unspecified knee: Secondary | ICD-10-CM | POA: Diagnosis not present

## 2012-04-07 DIAGNOSIS — N186 End stage renal disease: Secondary | ICD-10-CM | POA: Diagnosis not present

## 2012-04-10 DIAGNOSIS — D631 Anemia in chronic kidney disease: Secondary | ICD-10-CM | POA: Diagnosis not present

## 2012-04-10 DIAGNOSIS — N186 End stage renal disease: Secondary | ICD-10-CM | POA: Diagnosis not present

## 2012-04-10 DIAGNOSIS — D509 Iron deficiency anemia, unspecified: Secondary | ICD-10-CM | POA: Diagnosis not present

## 2012-04-10 DIAGNOSIS — N2581 Secondary hyperparathyroidism of renal origin: Secondary | ICD-10-CM | POA: Diagnosis not present

## 2012-04-11 DIAGNOSIS — D631 Anemia in chronic kidney disease: Secondary | ICD-10-CM | POA: Diagnosis not present

## 2012-04-11 DIAGNOSIS — R262 Difficulty in walking, not elsewhere classified: Secondary | ICD-10-CM | POA: Diagnosis not present

## 2012-04-11 DIAGNOSIS — E1129 Type 2 diabetes mellitus with other diabetic kidney complication: Secondary | ICD-10-CM | POA: Diagnosis not present

## 2012-04-11 DIAGNOSIS — IMO0001 Reserved for inherently not codable concepts without codable children: Secondary | ICD-10-CM | POA: Diagnosis not present

## 2012-04-11 DIAGNOSIS — N186 End stage renal disease: Secondary | ICD-10-CM | POA: Diagnosis not present

## 2012-04-13 DIAGNOSIS — IMO0001 Reserved for inherently not codable concepts without codable children: Secondary | ICD-10-CM | POA: Diagnosis not present

## 2012-04-13 DIAGNOSIS — N039 Chronic nephritic syndrome with unspecified morphologic changes: Secondary | ICD-10-CM | POA: Diagnosis not present

## 2012-04-13 DIAGNOSIS — R262 Difficulty in walking, not elsewhere classified: Secondary | ICD-10-CM | POA: Diagnosis not present

## 2012-04-13 DIAGNOSIS — N186 End stage renal disease: Secondary | ICD-10-CM | POA: Diagnosis not present

## 2012-04-13 DIAGNOSIS — E1129 Type 2 diabetes mellitus with other diabetic kidney complication: Secondary | ICD-10-CM | POA: Diagnosis not present

## 2012-04-18 DIAGNOSIS — N186 End stage renal disease: Secondary | ICD-10-CM | POA: Diagnosis not present

## 2012-04-18 DIAGNOSIS — N039 Chronic nephritic syndrome with unspecified morphologic changes: Secondary | ICD-10-CM | POA: Diagnosis not present

## 2012-04-18 DIAGNOSIS — E1129 Type 2 diabetes mellitus with other diabetic kidney complication: Secondary | ICD-10-CM | POA: Diagnosis not present

## 2012-04-18 DIAGNOSIS — IMO0001 Reserved for inherently not codable concepts without codable children: Secondary | ICD-10-CM | POA: Diagnosis not present

## 2012-04-18 DIAGNOSIS — R262 Difficulty in walking, not elsewhere classified: Secondary | ICD-10-CM | POA: Diagnosis not present

## 2012-04-25 DIAGNOSIS — IMO0001 Reserved for inherently not codable concepts without codable children: Secondary | ICD-10-CM | POA: Diagnosis not present

## 2012-04-25 DIAGNOSIS — E1129 Type 2 diabetes mellitus with other diabetic kidney complication: Secondary | ICD-10-CM | POA: Diagnosis not present

## 2012-04-25 DIAGNOSIS — N186 End stage renal disease: Secondary | ICD-10-CM | POA: Diagnosis not present

## 2012-04-25 DIAGNOSIS — D631 Anemia in chronic kidney disease: Secondary | ICD-10-CM | POA: Diagnosis not present

## 2012-04-25 DIAGNOSIS — R262 Difficulty in walking, not elsewhere classified: Secondary | ICD-10-CM | POA: Diagnosis not present

## 2012-05-02 DIAGNOSIS — IMO0001 Reserved for inherently not codable concepts without codable children: Secondary | ICD-10-CM | POA: Diagnosis not present

## 2012-05-05 DIAGNOSIS — N186 End stage renal disease: Secondary | ICD-10-CM | POA: Diagnosis not present

## 2012-05-08 DIAGNOSIS — N2581 Secondary hyperparathyroidism of renal origin: Secondary | ICD-10-CM | POA: Diagnosis not present

## 2012-05-08 DIAGNOSIS — D509 Iron deficiency anemia, unspecified: Secondary | ICD-10-CM | POA: Diagnosis not present

## 2012-05-08 DIAGNOSIS — N186 End stage renal disease: Secondary | ICD-10-CM | POA: Diagnosis not present

## 2012-05-09 DIAGNOSIS — R262 Difficulty in walking, not elsewhere classified: Secondary | ICD-10-CM | POA: Diagnosis not present

## 2012-06-05 DIAGNOSIS — N186 End stage renal disease: Secondary | ICD-10-CM | POA: Diagnosis not present

## 2012-06-07 DIAGNOSIS — D509 Iron deficiency anemia, unspecified: Secondary | ICD-10-CM | POA: Diagnosis not present

## 2012-06-07 DIAGNOSIS — D631 Anemia in chronic kidney disease: Secondary | ICD-10-CM | POA: Diagnosis not present

## 2012-06-07 DIAGNOSIS — N186 End stage renal disease: Secondary | ICD-10-CM | POA: Diagnosis not present

## 2012-06-28 DIAGNOSIS — E1129 Type 2 diabetes mellitus with other diabetic kidney complication: Secondary | ICD-10-CM | POA: Diagnosis not present

## 2012-07-07 DIAGNOSIS — N186 End stage renal disease: Secondary | ICD-10-CM | POA: Diagnosis not present

## 2012-07-07 DIAGNOSIS — D509 Iron deficiency anemia, unspecified: Secondary | ICD-10-CM | POA: Diagnosis not present

## 2012-07-07 DIAGNOSIS — N2581 Secondary hyperparathyroidism of renal origin: Secondary | ICD-10-CM | POA: Diagnosis not present

## 2012-07-07 DIAGNOSIS — D631 Anemia in chronic kidney disease: Secondary | ICD-10-CM | POA: Diagnosis not present

## 2012-08-05 DIAGNOSIS — N186 End stage renal disease: Secondary | ICD-10-CM | POA: Diagnosis not present

## 2012-08-07 DIAGNOSIS — D509 Iron deficiency anemia, unspecified: Secondary | ICD-10-CM | POA: Diagnosis not present

## 2012-08-07 DIAGNOSIS — D631 Anemia in chronic kidney disease: Secondary | ICD-10-CM | POA: Diagnosis not present

## 2012-08-07 DIAGNOSIS — N186 End stage renal disease: Secondary | ICD-10-CM | POA: Diagnosis not present

## 2012-08-07 DIAGNOSIS — N2581 Secondary hyperparathyroidism of renal origin: Secondary | ICD-10-CM | POA: Diagnosis not present

## 2012-08-14 ENCOUNTER — Other Ambulatory Visit (HOSPITAL_COMMUNITY): Payer: Self-pay | Admitting: Nephrology

## 2012-08-14 DIAGNOSIS — N186 End stage renal disease: Secondary | ICD-10-CM

## 2012-08-16 ENCOUNTER — Ambulatory Visit (HOSPITAL_COMMUNITY): Payer: Medicare Other

## 2012-08-17 ENCOUNTER — Ambulatory Visit (HOSPITAL_COMMUNITY): Admission: RE | Admit: 2012-08-17 | Payer: Medicare Other | Source: Ambulatory Visit

## 2012-08-17 DIAGNOSIS — T82898A Other specified complication of vascular prosthetic devices, implants and grafts, initial encounter: Secondary | ICD-10-CM | POA: Diagnosis not present

## 2012-08-17 DIAGNOSIS — I871 Compression of vein: Secondary | ICD-10-CM | POA: Diagnosis not present

## 2012-08-17 DIAGNOSIS — N186 End stage renal disease: Secondary | ICD-10-CM | POA: Diagnosis not present

## 2012-08-22 ENCOUNTER — Encounter (INDEPENDENT_AMBULATORY_CARE_PROVIDER_SITE_OTHER): Payer: Medicare Other | Admitting: Vascular Surgery

## 2012-08-22 ENCOUNTER — Encounter: Payer: Self-pay | Admitting: Podiatry

## 2012-08-22 DIAGNOSIS — E1159 Type 2 diabetes mellitus with other circulatory complications: Secondary | ICD-10-CM

## 2012-08-22 DIAGNOSIS — I739 Peripheral vascular disease, unspecified: Secondary | ICD-10-CM | POA: Diagnosis not present

## 2012-09-04 DIAGNOSIS — N186 End stage renal disease: Secondary | ICD-10-CM | POA: Diagnosis not present

## 2012-09-06 DIAGNOSIS — N186 End stage renal disease: Secondary | ICD-10-CM | POA: Diagnosis not present

## 2012-09-06 DIAGNOSIS — D509 Iron deficiency anemia, unspecified: Secondary | ICD-10-CM | POA: Diagnosis not present

## 2012-09-06 DIAGNOSIS — N2581 Secondary hyperparathyroidism of renal origin: Secondary | ICD-10-CM | POA: Diagnosis not present

## 2012-09-06 DIAGNOSIS — D631 Anemia in chronic kidney disease: Secondary | ICD-10-CM | POA: Diagnosis not present

## 2012-09-26 DIAGNOSIS — N186 End stage renal disease: Secondary | ICD-10-CM | POA: Diagnosis not present

## 2012-09-26 DIAGNOSIS — I10 Essential (primary) hypertension: Secondary | ICD-10-CM | POA: Diagnosis not present

## 2012-09-26 DIAGNOSIS — L8990 Pressure ulcer of unspecified site, unspecified stage: Secondary | ICD-10-CM | POA: Diagnosis not present

## 2012-09-27 DIAGNOSIS — E1129 Type 2 diabetes mellitus with other diabetic kidney complication: Secondary | ICD-10-CM | POA: Diagnosis not present

## 2012-10-05 DIAGNOSIS — N186 End stage renal disease: Secondary | ICD-10-CM | POA: Diagnosis not present

## 2012-10-06 DIAGNOSIS — D509 Iron deficiency anemia, unspecified: Secondary | ICD-10-CM | POA: Diagnosis not present

## 2012-10-06 DIAGNOSIS — N186 End stage renal disease: Secondary | ICD-10-CM | POA: Diagnosis not present

## 2012-10-06 DIAGNOSIS — D631 Anemia in chronic kidney disease: Secondary | ICD-10-CM | POA: Diagnosis not present

## 2012-10-26 DIAGNOSIS — Z853 Personal history of malignant neoplasm of breast: Secondary | ICD-10-CM | POA: Diagnosis not present

## 2012-10-26 DIAGNOSIS — IMO0001 Reserved for inherently not codable concepts without codable children: Secondary | ICD-10-CM | POA: Diagnosis not present

## 2012-10-26 DIAGNOSIS — L8991 Pressure ulcer of unspecified site, stage 1: Secondary | ICD-10-CM | POA: Diagnosis not present

## 2012-10-26 DIAGNOSIS — Z602 Problems related to living alone: Secondary | ICD-10-CM | POA: Diagnosis not present

## 2012-10-26 DIAGNOSIS — E1129 Type 2 diabetes mellitus with other diabetic kidney complication: Secondary | ICD-10-CM | POA: Diagnosis not present

## 2012-10-26 DIAGNOSIS — Z992 Dependence on renal dialysis: Secondary | ICD-10-CM | POA: Diagnosis not present

## 2012-10-26 DIAGNOSIS — L89309 Pressure ulcer of unspecified buttock, unspecified stage: Secondary | ICD-10-CM | POA: Diagnosis not present

## 2012-10-26 DIAGNOSIS — R5381 Other malaise: Secondary | ICD-10-CM | POA: Diagnosis not present

## 2012-10-26 DIAGNOSIS — I10 Essential (primary) hypertension: Secondary | ICD-10-CM | POA: Diagnosis not present

## 2012-10-26 DIAGNOSIS — N186 End stage renal disease: Secondary | ICD-10-CM | POA: Diagnosis not present

## 2012-10-31 DIAGNOSIS — I871 Compression of vein: Secondary | ICD-10-CM | POA: Diagnosis not present

## 2012-10-31 DIAGNOSIS — T82898A Other specified complication of vascular prosthetic devices, implants and grafts, initial encounter: Secondary | ICD-10-CM | POA: Diagnosis not present

## 2012-10-31 DIAGNOSIS — N186 End stage renal disease: Secondary | ICD-10-CM | POA: Diagnosis not present

## 2012-11-02 DIAGNOSIS — N186 End stage renal disease: Secondary | ICD-10-CM | POA: Diagnosis not present

## 2012-11-02 DIAGNOSIS — L8991 Pressure ulcer of unspecified site, stage 1: Secondary | ICD-10-CM | POA: Diagnosis not present

## 2012-11-02 DIAGNOSIS — I10 Essential (primary) hypertension: Secondary | ICD-10-CM | POA: Diagnosis not present

## 2012-11-02 DIAGNOSIS — E1129 Type 2 diabetes mellitus with other diabetic kidney complication: Secondary | ICD-10-CM | POA: Diagnosis not present

## 2012-11-02 DIAGNOSIS — L89309 Pressure ulcer of unspecified buttock, unspecified stage: Secondary | ICD-10-CM | POA: Diagnosis not present

## 2012-11-02 DIAGNOSIS — IMO0001 Reserved for inherently not codable concepts without codable children: Secondary | ICD-10-CM | POA: Diagnosis not present

## 2012-11-05 DIAGNOSIS — N186 End stage renal disease: Secondary | ICD-10-CM | POA: Diagnosis not present

## 2012-11-06 DIAGNOSIS — Z23 Encounter for immunization: Secondary | ICD-10-CM | POA: Diagnosis not present

## 2012-11-06 DIAGNOSIS — N2581 Secondary hyperparathyroidism of renal origin: Secondary | ICD-10-CM | POA: Diagnosis not present

## 2012-11-06 DIAGNOSIS — D509 Iron deficiency anemia, unspecified: Secondary | ICD-10-CM | POA: Diagnosis not present

## 2012-11-06 DIAGNOSIS — N186 End stage renal disease: Secondary | ICD-10-CM | POA: Diagnosis not present

## 2012-11-07 DIAGNOSIS — N186 End stage renal disease: Secondary | ICD-10-CM | POA: Diagnosis not present

## 2012-11-07 DIAGNOSIS — L89309 Pressure ulcer of unspecified buttock, unspecified stage: Secondary | ICD-10-CM | POA: Diagnosis not present

## 2012-11-07 DIAGNOSIS — L8991 Pressure ulcer of unspecified site, stage 1: Secondary | ICD-10-CM | POA: Diagnosis not present

## 2012-11-07 DIAGNOSIS — IMO0001 Reserved for inherently not codable concepts without codable children: Secondary | ICD-10-CM | POA: Diagnosis not present

## 2012-11-07 DIAGNOSIS — I10 Essential (primary) hypertension: Secondary | ICD-10-CM | POA: Diagnosis not present

## 2012-11-07 DIAGNOSIS — E1129 Type 2 diabetes mellitus with other diabetic kidney complication: Secondary | ICD-10-CM | POA: Diagnosis not present

## 2012-11-09 DIAGNOSIS — L89309 Pressure ulcer of unspecified buttock, unspecified stage: Secondary | ICD-10-CM | POA: Diagnosis not present

## 2012-11-09 DIAGNOSIS — I10 Essential (primary) hypertension: Secondary | ICD-10-CM | POA: Diagnosis not present

## 2012-11-09 DIAGNOSIS — E1129 Type 2 diabetes mellitus with other diabetic kidney complication: Secondary | ICD-10-CM | POA: Diagnosis not present

## 2012-11-09 DIAGNOSIS — N186 End stage renal disease: Secondary | ICD-10-CM | POA: Diagnosis not present

## 2012-11-09 DIAGNOSIS — IMO0001 Reserved for inherently not codable concepts without codable children: Secondary | ICD-10-CM | POA: Diagnosis not present

## 2012-11-09 DIAGNOSIS — L8991 Pressure ulcer of unspecified site, stage 1: Secondary | ICD-10-CM | POA: Diagnosis not present

## 2012-11-14 DIAGNOSIS — IMO0001 Reserved for inherently not codable concepts without codable children: Secondary | ICD-10-CM | POA: Diagnosis not present

## 2012-11-14 DIAGNOSIS — I10 Essential (primary) hypertension: Secondary | ICD-10-CM | POA: Diagnosis not present

## 2012-11-14 DIAGNOSIS — N186 End stage renal disease: Secondary | ICD-10-CM | POA: Diagnosis not present

## 2012-11-14 DIAGNOSIS — L8991 Pressure ulcer of unspecified site, stage 1: Secondary | ICD-10-CM | POA: Diagnosis not present

## 2012-11-14 DIAGNOSIS — E1129 Type 2 diabetes mellitus with other diabetic kidney complication: Secondary | ICD-10-CM | POA: Diagnosis not present

## 2012-11-14 DIAGNOSIS — L89309 Pressure ulcer of unspecified buttock, unspecified stage: Secondary | ICD-10-CM | POA: Diagnosis not present

## 2012-11-16 DIAGNOSIS — N186 End stage renal disease: Secondary | ICD-10-CM | POA: Diagnosis not present

## 2012-11-16 DIAGNOSIS — L8991 Pressure ulcer of unspecified site, stage 1: Secondary | ICD-10-CM | POA: Diagnosis not present

## 2012-11-16 DIAGNOSIS — IMO0001 Reserved for inherently not codable concepts without codable children: Secondary | ICD-10-CM | POA: Diagnosis not present

## 2012-11-16 DIAGNOSIS — L89309 Pressure ulcer of unspecified buttock, unspecified stage: Secondary | ICD-10-CM | POA: Diagnosis not present

## 2012-11-16 DIAGNOSIS — I10 Essential (primary) hypertension: Secondary | ICD-10-CM | POA: Diagnosis not present

## 2012-11-16 DIAGNOSIS — E1129 Type 2 diabetes mellitus with other diabetic kidney complication: Secondary | ICD-10-CM | POA: Diagnosis not present

## 2012-11-21 DIAGNOSIS — L89309 Pressure ulcer of unspecified buttock, unspecified stage: Secondary | ICD-10-CM | POA: Diagnosis not present

## 2012-11-21 DIAGNOSIS — E1129 Type 2 diabetes mellitus with other diabetic kidney complication: Secondary | ICD-10-CM | POA: Diagnosis not present

## 2012-11-21 DIAGNOSIS — L8991 Pressure ulcer of unspecified site, stage 1: Secondary | ICD-10-CM | POA: Diagnosis not present

## 2012-11-21 DIAGNOSIS — N186 End stage renal disease: Secondary | ICD-10-CM | POA: Diagnosis not present

## 2012-11-21 DIAGNOSIS — IMO0001 Reserved for inherently not codable concepts without codable children: Secondary | ICD-10-CM | POA: Diagnosis not present

## 2012-11-21 DIAGNOSIS — I10 Essential (primary) hypertension: Secondary | ICD-10-CM | POA: Diagnosis not present

## 2012-11-23 DIAGNOSIS — I10 Essential (primary) hypertension: Secondary | ICD-10-CM | POA: Diagnosis not present

## 2012-11-23 DIAGNOSIS — N186 End stage renal disease: Secondary | ICD-10-CM | POA: Diagnosis not present

## 2012-11-23 DIAGNOSIS — IMO0001 Reserved for inherently not codable concepts without codable children: Secondary | ICD-10-CM | POA: Diagnosis not present

## 2012-11-23 DIAGNOSIS — L8991 Pressure ulcer of unspecified site, stage 1: Secondary | ICD-10-CM | POA: Diagnosis not present

## 2012-11-23 DIAGNOSIS — E1129 Type 2 diabetes mellitus with other diabetic kidney complication: Secondary | ICD-10-CM | POA: Diagnosis not present

## 2012-11-23 DIAGNOSIS — L89309 Pressure ulcer of unspecified buttock, unspecified stage: Secondary | ICD-10-CM | POA: Diagnosis not present

## 2012-11-28 DIAGNOSIS — L89309 Pressure ulcer of unspecified buttock, unspecified stage: Secondary | ICD-10-CM | POA: Diagnosis not present

## 2012-11-28 DIAGNOSIS — I10 Essential (primary) hypertension: Secondary | ICD-10-CM | POA: Diagnosis not present

## 2012-11-28 DIAGNOSIS — L8991 Pressure ulcer of unspecified site, stage 1: Secondary | ICD-10-CM | POA: Diagnosis not present

## 2012-11-28 DIAGNOSIS — IMO0001 Reserved for inherently not codable concepts without codable children: Secondary | ICD-10-CM | POA: Diagnosis not present

## 2012-11-28 DIAGNOSIS — E1129 Type 2 diabetes mellitus with other diabetic kidney complication: Secondary | ICD-10-CM | POA: Diagnosis not present

## 2012-11-28 DIAGNOSIS — N186 End stage renal disease: Secondary | ICD-10-CM | POA: Diagnosis not present

## 2012-11-30 DIAGNOSIS — IMO0001 Reserved for inherently not codable concepts without codable children: Secondary | ICD-10-CM | POA: Diagnosis not present

## 2012-11-30 DIAGNOSIS — L89309 Pressure ulcer of unspecified buttock, unspecified stage: Secondary | ICD-10-CM | POA: Diagnosis not present

## 2012-11-30 DIAGNOSIS — L8991 Pressure ulcer of unspecified site, stage 1: Secondary | ICD-10-CM | POA: Diagnosis not present

## 2012-11-30 DIAGNOSIS — E1129 Type 2 diabetes mellitus with other diabetic kidney complication: Secondary | ICD-10-CM | POA: Diagnosis not present

## 2012-11-30 DIAGNOSIS — I10 Essential (primary) hypertension: Secondary | ICD-10-CM | POA: Diagnosis not present

## 2012-11-30 DIAGNOSIS — N186 End stage renal disease: Secondary | ICD-10-CM | POA: Diagnosis not present

## 2012-12-05 DIAGNOSIS — E1129 Type 2 diabetes mellitus with other diabetic kidney complication: Secondary | ICD-10-CM | POA: Diagnosis not present

## 2012-12-05 DIAGNOSIS — N186 End stage renal disease: Secondary | ICD-10-CM | POA: Diagnosis not present

## 2012-12-05 DIAGNOSIS — L8991 Pressure ulcer of unspecified site, stage 1: Secondary | ICD-10-CM | POA: Diagnosis not present

## 2012-12-05 DIAGNOSIS — L89309 Pressure ulcer of unspecified buttock, unspecified stage: Secondary | ICD-10-CM | POA: Diagnosis not present

## 2012-12-05 DIAGNOSIS — IMO0001 Reserved for inherently not codable concepts without codable children: Secondary | ICD-10-CM | POA: Diagnosis not present

## 2012-12-05 DIAGNOSIS — I10 Essential (primary) hypertension: Secondary | ICD-10-CM | POA: Diagnosis not present

## 2012-12-06 DIAGNOSIS — N2581 Secondary hyperparathyroidism of renal origin: Secondary | ICD-10-CM | POA: Diagnosis not present

## 2012-12-06 DIAGNOSIS — D631 Anemia in chronic kidney disease: Secondary | ICD-10-CM | POA: Diagnosis not present

## 2012-12-06 DIAGNOSIS — D509 Iron deficiency anemia, unspecified: Secondary | ICD-10-CM | POA: Diagnosis not present

## 2012-12-06 DIAGNOSIS — N186 End stage renal disease: Secondary | ICD-10-CM | POA: Diagnosis not present

## 2012-12-07 DIAGNOSIS — L8991 Pressure ulcer of unspecified site, stage 1: Secondary | ICD-10-CM | POA: Diagnosis not present

## 2012-12-07 DIAGNOSIS — L89309 Pressure ulcer of unspecified buttock, unspecified stage: Secondary | ICD-10-CM | POA: Diagnosis not present

## 2012-12-07 DIAGNOSIS — N186 End stage renal disease: Secondary | ICD-10-CM | POA: Diagnosis not present

## 2012-12-07 DIAGNOSIS — E1129 Type 2 diabetes mellitus with other diabetic kidney complication: Secondary | ICD-10-CM | POA: Diagnosis not present

## 2012-12-07 DIAGNOSIS — IMO0001 Reserved for inherently not codable concepts without codable children: Secondary | ICD-10-CM | POA: Diagnosis not present

## 2012-12-07 DIAGNOSIS — I10 Essential (primary) hypertension: Secondary | ICD-10-CM | POA: Diagnosis not present

## 2012-12-27 DIAGNOSIS — E1129 Type 2 diabetes mellitus with other diabetic kidney complication: Secondary | ICD-10-CM | POA: Diagnosis not present

## 2013-01-05 DIAGNOSIS — N186 End stage renal disease: Secondary | ICD-10-CM | POA: Diagnosis not present

## 2013-01-08 DIAGNOSIS — N186 End stage renal disease: Secondary | ICD-10-CM | POA: Diagnosis not present

## 2013-01-08 DIAGNOSIS — D509 Iron deficiency anemia, unspecified: Secondary | ICD-10-CM | POA: Diagnosis not present

## 2013-01-08 DIAGNOSIS — N2581 Secondary hyperparathyroidism of renal origin: Secondary | ICD-10-CM | POA: Diagnosis not present

## 2013-01-08 DIAGNOSIS — D631 Anemia in chronic kidney disease: Secondary | ICD-10-CM | POA: Diagnosis not present

## 2013-01-29 ENCOUNTER — Emergency Department (HOSPITAL_COMMUNITY)
Admission: EM | Admit: 2013-01-29 | Discharge: 2013-01-29 | Disposition: A | Discharge status: Home / Self Care | Payer: Medicare Other | Attending: Emergency Medicine | Admitting: Emergency Medicine

## 2013-01-29 ENCOUNTER — Encounter (HOSPITAL_COMMUNITY): Payer: Self-pay | Admitting: Emergency Medicine

## 2013-01-29 DIAGNOSIS — N186 End stage renal disease: Secondary | ICD-10-CM | POA: Insufficient documentation

## 2013-01-29 DIAGNOSIS — Z853 Personal history of malignant neoplasm of breast: Secondary | ICD-10-CM | POA: Diagnosis not present

## 2013-01-29 DIAGNOSIS — Y841 Kidney dialysis as the cause of abnormal reaction of the patient, or of later complication, without mention of misadventure at the time of the procedure: Secondary | ICD-10-CM | POA: Insufficient documentation

## 2013-01-29 DIAGNOSIS — I2510 Atherosclerotic heart disease of native coronary artery without angina pectoris: Secondary | ICD-10-CM | POA: Insufficient documentation

## 2013-01-29 DIAGNOSIS — T829XXA Unspecified complication of cardiac and vascular prosthetic device, implant and graft, initial encounter: Secondary | ICD-10-CM

## 2013-01-29 DIAGNOSIS — E119 Type 2 diabetes mellitus without complications: Secondary | ICD-10-CM | POA: Insufficient documentation

## 2013-01-29 DIAGNOSIS — Z862 Personal history of diseases of the blood and blood-forming organs and certain disorders involving the immune mechanism: Secondary | ICD-10-CM | POA: Diagnosis not present

## 2013-01-29 DIAGNOSIS — Z992 Dependence on renal dialysis: Secondary | ICD-10-CM | POA: Diagnosis not present

## 2013-01-29 DIAGNOSIS — T819XXA Unspecified complication of procedure, initial encounter: Secondary | ICD-10-CM | POA: Diagnosis not present

## 2013-01-29 DIAGNOSIS — Z8619 Personal history of other infectious and parasitic diseases: Secondary | ICD-10-CM | POA: Insufficient documentation

## 2013-01-29 DIAGNOSIS — Z79899 Other long term (current) drug therapy: Secondary | ICD-10-CM | POA: Insufficient documentation

## 2013-01-29 DIAGNOSIS — IMO0002 Reserved for concepts with insufficient information to code with codable children: Secondary | ICD-10-CM | POA: Insufficient documentation

## 2013-01-29 DIAGNOSIS — R58 Hemorrhage, not elsewhere classified: Secondary | ICD-10-CM | POA: Diagnosis not present

## 2013-01-29 DIAGNOSIS — T82898A Other specified complication of vascular prosthetic devices, implants and grafts, initial encounter: Secondary | ICD-10-CM | POA: Diagnosis not present

## 2013-01-29 DIAGNOSIS — T8131XA Disruption of external operation (surgical) wound, not elsewhere classified, initial encounter: Secondary | ICD-10-CM | POA: Diagnosis not present

## 2013-01-29 NOTE — ED Provider Notes (Signed)
CSN: [id number]     Arrival date & time 01/29/13  1239 History   First MD Initiated Contact with Patient 01/29/13 1259     Chief Complaint  Patient presents with  . Graft bleed    (Consider location/radiation/quality/duration/timing/severity/associated sxs/prior Treatment) HPI  79 year old female for dialysis with bleeding from her graft site. Onset just before arrival.  Patient finished her session of dialysis and upon completion she had bleeding when the catheter was removed. Unable to be controlled with pressure. Patient is not on blood thinning medications. Denies any dizziness, lightheadedness or shortness of breath. No numbness, tingling or acute change in strength in her left hand. Chronic left upper trunk swelling which is not acutely worse.  Past Medical History  Diagnosis Date  . ESRD (end stage renal disease)   . DM (diabetes mellitus)   . Hepatitis C   . Anemia   . PVD (peripheral vascular disease)   . CAD (coronary artery disease)   . Breast cancer   . Hyperparathyroidism   . Dyslipidemia    Past Surgical History  Procedure Laterality Date  . Cataract rt eye    . Mastectomy  (left)    . Abdominal hysterectomy    . Arteriovenous graft placement  03/20/09    revised 04/18/09  . Left median nerve decompression    . Cervical discectomy    . Toe amuputation- rt 1,2,3rd toes    . Dg av dialysis graft declot or  lua graft    01/27/11, 02/15/11, 03/15/11, 10/13/11   History reviewed. No pertinent family history. History  Substance Use Topics  . Smoking status: Never Smoker   . Smokeless tobacco: Not on file  . Alcohol Use: Not on file   OB History   Grav Para Term Preterm Abortions TAB SAB Ect Mult Living                 Review of Systems  All systems reviewed and negative, other than as noted in HPI.   Allergies  Review of patient's allergies indicates no known allergies.  Home Medications   Current Outpatient Rx  Name  Route  Sig  Dispense  Refill  .  amLODipine (NORVASC) 5 MG tablet   Oral   Take 5 mg by mouth daily.           Marland Kitchen b complex-vitamin c-folic acid (NEPHRO-VITE) 0.8 MG TABS   Oral   Take 0.8 mg by mouth at bedtime.           . calcium acetate (PHOSLO) 667 MG capsule   Oral   Take 667 mg by mouth 3 (three) times daily with meals.           . cinacalcet (SENSIPAR) 30 MG tablet   Oral   Take 30 mg by mouth daily.          . cinacalcet (SENSIPAR) 60 MG tablet   Oral   Take 60 mg by mouth at bedtime.           Marland Kitchen ibuprofen (ADVIL,MOTRIN) 200 MG tablet   Oral   Take 200 mg by mouth every 6 (six) hours as needed for moderate pain.         Marland Kitchen lanthanum (FOSRENOL) 1000 MG chewable tablet   Oral   Chew 1,000 mg by mouth 3 (three) times daily with meals.           . metoprolol tartrate (LOPRESSOR) 25 MG tablet   Oral   Take 25 mg  by mouth 2 (two) times daily.           . polyethylene glycol (MIRALAX / GLYCOLAX) packet   Oral   Take 17 g by mouth daily as needed for mild constipation.         . Pseudoeph-Doxylamine-DM-APAP (NYQUIL PO)   Oral   Take 30 mLs by mouth at bedtime as needed (coughing).          BP 181/78  Temp(Src) 97.7 F (36.5 C) (Oral)  Resp 18  SpO2 100% Physical Exam  Nursing note and vitals reviewed. Constitutional: She appears well-developed and well-nourished. No distress.  HENT:  Head: Normocephalic and atraumatic.  Eyes: Conjunctivae are normal. Right eye exhibits no discharge. Left eye exhibits no discharge.  Neck: Neck supple.  Cardiovascular: Normal rate, regular rhythm and normal heart sounds.  Exam reveals no gallop and no friction rub.   No murmur heard. Graft LUE with pressure bandage in place. Removed. No bleeding noted. Graft with palpable thrill. Swelling distally. NVI intact distally.   Pulmonary/Chest: Effort normal and breath sounds normal. No respiratory distress.  Abdominal: Soft. She exhibits no distension. There is no tenderness.  Musculoskeletal: She  exhibits no edema and no tenderness.  Neurological: She is alert.  Skin: Skin is warm and dry.  Psychiatric: She has a normal mood and affect. Her behavior is normal. Thought content normal.    ED Course  Procedures (including critical care time) Labs Review Labs Reviewed - No data to display Imaging Review No results found.  EKG Interpretation   None       MDM   1. Complications due to renal dialysis device, implant, and graft, initial encounter    79 year old female sent from dialysis with a bleeding graft. Patient finished her treatment. Post procedure though, unable to control the bleeding. A pressure dressing was applied and by the time she came to the emergency room she did not have any active bleeding. We'll continue to observe at this time, but I do not feel further intervention is currently needed. If should re-bleed will considered obtaining labs and additional interventions.   1:41 PM Patient was reexamined approximately one hour after arrival. Graft without any bleeding. I feel she is stable for discharge at this time. Pt left with dressing. Instructed to keep on at least until this evening. Return precautions were discussed.  Raeford Razor, MD 01/29/13 1410

## 2013-01-29 NOTE — ED Notes (Signed)
Pt comes from dialysis with a c/o graft would not stop bleeding. Pt had her full course of dialysis and is not having any other complaints. Bleeding is controlled with clamp and pressure dressing. Last pressure 110/80.

## 2013-02-04 DIAGNOSIS — N186 End stage renal disease: Secondary | ICD-10-CM | POA: Diagnosis not present

## 2013-02-05 DIAGNOSIS — D631 Anemia in chronic kidney disease: Secondary | ICD-10-CM | POA: Diagnosis not present

## 2013-02-05 DIAGNOSIS — N2581 Secondary hyperparathyroidism of renal origin: Secondary | ICD-10-CM | POA: Diagnosis not present

## 2013-02-05 DIAGNOSIS — N186 End stage renal disease: Secondary | ICD-10-CM | POA: Diagnosis not present

## 2013-02-05 DIAGNOSIS — D509 Iron deficiency anemia, unspecified: Secondary | ICD-10-CM | POA: Diagnosis not present

## 2013-02-07 ENCOUNTER — Encounter (HOSPITAL_COMMUNITY): Payer: Self-pay | Admitting: Emergency Medicine

## 2013-02-07 ENCOUNTER — Emergency Department (HOSPITAL_COMMUNITY)
Admission: EM | Admit: 2013-02-07 | Discharge: 2013-02-07 | Disposition: A | Discharge status: Home / Self Care | Payer: Medicare Other | Attending: Emergency Medicine | Admitting: Emergency Medicine

## 2013-02-07 DIAGNOSIS — IMO0002 Reserved for concepts with insufficient information to code with codable children: Secondary | ICD-10-CM | POA: Diagnosis not present

## 2013-02-07 DIAGNOSIS — D649 Anemia, unspecified: Secondary | ICD-10-CM | POA: Diagnosis not present

## 2013-02-07 DIAGNOSIS — Z853 Personal history of malignant neoplasm of breast: Secondary | ICD-10-CM | POA: Insufficient documentation

## 2013-02-07 DIAGNOSIS — Y838 Other surgical procedures as the cause of abnormal reaction of the patient, or of later complication, without mention of misadventure at the time of the procedure: Secondary | ICD-10-CM | POA: Insufficient documentation

## 2013-02-07 DIAGNOSIS — N186 End stage renal disease: Secondary | ICD-10-CM

## 2013-02-07 DIAGNOSIS — I2510 Atherosclerotic heart disease of native coronary artery without angina pectoris: Secondary | ICD-10-CM | POA: Insufficient documentation

## 2013-02-07 DIAGNOSIS — Z8619 Personal history of other infectious and parasitic diseases: Secondary | ICD-10-CM | POA: Insufficient documentation

## 2013-02-07 DIAGNOSIS — E119 Type 2 diabetes mellitus without complications: Secondary | ICD-10-CM | POA: Diagnosis not present

## 2013-02-07 DIAGNOSIS — Z79899 Other long term (current) drug therapy: Secondary | ICD-10-CM | POA: Diagnosis not present

## 2013-02-07 DIAGNOSIS — T82898A Other specified complication of vascular prosthetic devices, implants and grafts, initial encounter: Secondary | ICD-10-CM | POA: Diagnosis not present

## 2013-02-07 DIAGNOSIS — T82598A Other mechanical complication of other cardiac and vascular devices and implants, initial encounter: Secondary | ICD-10-CM | POA: Diagnosis not present

## 2013-02-07 DIAGNOSIS — Z992 Dependence on renal dialysis: Secondary | ICD-10-CM | POA: Insufficient documentation

## 2013-02-07 DIAGNOSIS — E213 Hyperparathyroidism, unspecified: Secondary | ICD-10-CM | POA: Diagnosis not present

## 2013-02-07 DIAGNOSIS — R58 Hemorrhage, not elsewhere classified: Secondary | ICD-10-CM

## 2013-02-07 DIAGNOSIS — T819XXA Unspecified complication of procedure, initial encounter: Secondary | ICD-10-CM | POA: Diagnosis not present

## 2013-02-07 NOTE — ED Provider Notes (Signed)
CSN: [id number]     Arrival date & time 02/07/13  1319 History   First MD Initiated Contact with Patient 02/07/13 1328     Chief Complaint  Patient presents with  . Bleeding/Bruising   (Consider location/radiation/quality/duration/timing/severity/associated sxs/prior Treatment) HPI Comments: Patient is a 79 year old female with history of end-stage renal disease on hemodialysis. She had her dialysis this morning when the catheter was removed she began bleeding from the site. Direct pressure and a dressing applied by EMS was able to control the bleeding. She denies any symptoms. She denies any pain, shortness of breath, dizziness. She does have swelling of the left arm distal to the fistula but states that this has been present for several months. She is due to see the vascular surgeons tomorrow to have this evaluated.  The history is provided by the patient.    Past Medical History  Diagnosis Date  . ESRD (end stage renal disease)   . DM (diabetes mellitus)   . Hepatitis C   . Anemia   . PVD (peripheral vascular disease)   . CAD (coronary artery disease)   . Breast cancer   . Hyperparathyroidism   . Dyslipidemia    Past Surgical History  Procedure Laterality Date  . Cataract rt eye    . Mastectomy  (left)    . Abdominal hysterectomy    . Arteriovenous graft placement  03/20/09    revised 04/18/09  . Left median nerve decompression    . Cervical discectomy    . Toe amuputation- rt 1,2,3rd toes    . Dg av dialysis graft declot or  lua graft    01/27/11, 02/15/11, 03/15/11, 10/13/11   History reviewed. No pertinent family history. History  Substance Use Topics  . Smoking status: Never Smoker   . Smokeless tobacco: Not on file  . Alcohol Use: No   OB History   Grav Para Term Preterm Abortions TAB SAB Ect Mult Living                 Review of Systems  All other systems reviewed and are negative.    Allergies  Review of patient's allergies indicates no known  allergies.  Home Medications   Current Outpatient Rx  Name  Route  Sig  Dispense  Refill  . amLODipine (NORVASC) 5 MG tablet   Oral   Take 5 mg by mouth daily.           Marland Kitchen b complex-vitamin c-folic acid (NEPHRO-VITE) 0.8 MG TABS   Oral   Take 0.8 mg by mouth at bedtime.           . calcium acetate (PHOSLO) 667 MG capsule   Oral   Take 667 mg by mouth 3 (three) times daily with meals.           . cinacalcet (SENSIPAR) 30 MG tablet   Oral   Take 30 mg by mouth daily.          . cinacalcet (SENSIPAR) 60 MG tablet   Oral   Take 60 mg by mouth at bedtime.           Marland Kitchen ibuprofen (ADVIL,MOTRIN) 200 MG tablet   Oral   Take 200 mg by mouth every 6 (six) hours as needed for moderate pain.         Marland Kitchen lanthanum (FOSRENOL) 1000 MG chewable tablet   Oral   Chew 1,000 mg by mouth 3 (three) times daily with meals.           Marland Kitchen  metoprolol tartrate (LOPRESSOR) 25 MG tablet   Oral   Take 25 mg by mouth 2 (two) times daily.           . polyethylene glycol (MIRALAX / GLYCOLAX) packet   Oral   Take 17 g by mouth daily as needed for mild constipation.         . Pseudoeph-Doxylamine-DM-APAP (NYQUIL PO)   Oral   Take 30 mLs by mouth at bedtime as needed (coughing).          BP 181/73  Pulse 60  Temp(Src) 98.7 F (37.1 C) (Oral)  Resp 18  SpO2 96% Physical Exam  Nursing note and vitals reviewed. Constitutional: She is oriented to person, place, and time. She appears well-developed and well-nourished. No distress.  HENT:  Head: Normocephalic and atraumatic.  Neck: Normal range of motion. Neck supple.  Musculoskeletal: Normal range of motion.  The left upper extremity graft site is in the upper arm. There is a dressing in place and appears to be controlling the bleeding. There is edema of the left arm and hand distal to this, however ulnar and radial pulses are palpable and capillary refill is brisk. She is able to wiggle all fingers and sensation is intact. There is  a palpable thrill within the graft itself.  Neurological: She is alert and oriented to person, place, and time.  Skin: Skin is warm and dry. She is not diaphoretic.    ED Course  Procedures (including critical care time) Labs Review Labs Reviewed - No data to display Imaging Review No results found.    MDM  No diagnosis found. Patient is a 79 year old female on dialysis who presents here for bleeding from her dialysis fistula. A dressing was applied by EMS and the bleeding had stopped prior to arrival. This was left on while in the ED. Was then removed and the bleeding did not recur. There was a palpable thrill present. A Coban and dressing was lightly applied and she was advised to leave this on until her next dialysis treatment. She has an edematous left extremity which has been known to be this way for several months. She is due to see vascular surgery in the very near future.    Geoffery Lyons, MD 02/07/13 [phone]

## 2013-02-07 NOTE — ED Notes (Signed)
[facility] presents with a 79 yo female that was at the dialysis center completing  dialysis treatment when the site began bleeding uncontrollably.  [facility] was able to control/stop bleeding from the dialysis site on scene with 4x4 gauze and tape.  Site is bandaged at this time and patient is A&Ox4. No other problems/deficits at this time.

## 2013-02-08 DIAGNOSIS — T82898A Other specified complication of vascular prosthetic devices, implants and grafts, initial encounter: Secondary | ICD-10-CM | POA: Diagnosis not present

## 2013-02-08 DIAGNOSIS — N186 End stage renal disease: Secondary | ICD-10-CM | POA: Diagnosis not present

## 2013-02-08 DIAGNOSIS — I871 Compression of vein: Secondary | ICD-10-CM | POA: Diagnosis not present

## 2013-03-07 DIAGNOSIS — N186 End stage renal disease: Secondary | ICD-10-CM | POA: Diagnosis not present

## 2013-03-09 DIAGNOSIS — D509 Iron deficiency anemia, unspecified: Secondary | ICD-10-CM | POA: Diagnosis not present

## 2013-03-09 DIAGNOSIS — D631 Anemia in chronic kidney disease: Secondary | ICD-10-CM | POA: Diagnosis not present

## 2013-03-09 DIAGNOSIS — Z992 Dependence on renal dialysis: Secondary | ICD-10-CM | POA: Diagnosis not present

## 2013-03-09 DIAGNOSIS — N186 End stage renal disease: Secondary | ICD-10-CM | POA: Diagnosis not present

## 2013-04-04 DIAGNOSIS — E1129 Type 2 diabetes mellitus with other diabetic kidney complication: Secondary | ICD-10-CM | POA: Diagnosis not present

## 2013-04-07 DIAGNOSIS — N186 End stage renal disease: Secondary | ICD-10-CM | POA: Diagnosis not present

## 2013-04-09 DIAGNOSIS — N2581 Secondary hyperparathyroidism of renal origin: Secondary | ICD-10-CM | POA: Diagnosis not present

## 2013-04-09 DIAGNOSIS — D509 Iron deficiency anemia, unspecified: Secondary | ICD-10-CM | POA: Diagnosis not present

## 2013-04-09 DIAGNOSIS — N186 End stage renal disease: Secondary | ICD-10-CM | POA: Diagnosis not present

## 2013-05-05 DIAGNOSIS — N186 End stage renal disease: Secondary | ICD-10-CM | POA: Diagnosis not present

## 2013-05-07 DIAGNOSIS — N039 Chronic nephritic syndrome with unspecified morphologic changes: Secondary | ICD-10-CM | POA: Diagnosis not present

## 2013-05-07 DIAGNOSIS — N2581 Secondary hyperparathyroidism of renal origin: Secondary | ICD-10-CM | POA: Diagnosis not present

## 2013-05-07 DIAGNOSIS — N186 End stage renal disease: Secondary | ICD-10-CM | POA: Diagnosis not present

## 2013-05-07 DIAGNOSIS — D631 Anemia in chronic kidney disease: Secondary | ICD-10-CM | POA: Diagnosis not present

## 2013-05-07 DIAGNOSIS — D509 Iron deficiency anemia, unspecified: Secondary | ICD-10-CM | POA: Diagnosis not present

## 2013-05-10 DIAGNOSIS — I871 Compression of vein: Secondary | ICD-10-CM | POA: Diagnosis not present

## 2013-05-10 DIAGNOSIS — N186 End stage renal disease: Secondary | ICD-10-CM | POA: Diagnosis not present

## 2013-05-10 DIAGNOSIS — T82898A Other specified complication of vascular prosthetic devices, implants and grafts, initial encounter: Secondary | ICD-10-CM | POA: Diagnosis not present

## 2013-06-05 DIAGNOSIS — N186 End stage renal disease: Secondary | ICD-10-CM | POA: Diagnosis not present

## 2013-06-06 DIAGNOSIS — D509 Iron deficiency anemia, unspecified: Secondary | ICD-10-CM | POA: Diagnosis not present

## 2013-06-06 DIAGNOSIS — E119 Type 2 diabetes mellitus without complications: Secondary | ICD-10-CM | POA: Diagnosis not present

## 2013-06-06 DIAGNOSIS — N2581 Secondary hyperparathyroidism of renal origin: Secondary | ICD-10-CM | POA: Diagnosis not present

## 2013-06-06 DIAGNOSIS — D631 Anemia in chronic kidney disease: Secondary | ICD-10-CM | POA: Diagnosis not present

## 2013-06-06 DIAGNOSIS — N039 Chronic nephritic syndrome with unspecified morphologic changes: Secondary | ICD-10-CM | POA: Diagnosis not present

## 2013-06-06 DIAGNOSIS — N186 End stage renal disease: Secondary | ICD-10-CM | POA: Diagnosis not present

## 2013-06-29 ENCOUNTER — Other Ambulatory Visit: Payer: Self-pay | Admitting: Family Medicine

## 2013-06-29 ENCOUNTER — Ambulatory Visit
Admission: RE | Admit: 2013-06-29 | Discharge: 2013-06-29 | Disposition: A | Discharge status: Home / Self Care | Payer: Medicare Other | Source: Ambulatory Visit | Attending: Family Medicine | Admitting: Family Medicine

## 2013-06-29 ENCOUNTER — Encounter (INDEPENDENT_AMBULATORY_CARE_PROVIDER_SITE_OTHER): Payer: Self-pay

## 2013-06-29 DIAGNOSIS — Z853 Personal history of malignant neoplasm of breast: Secondary | ICD-10-CM | POA: Diagnosis not present

## 2013-06-29 DIAGNOSIS — N6310 Unspecified lump in the right breast, unspecified quadrant: Secondary | ICD-10-CM

## 2013-06-29 DIAGNOSIS — N6320 Unspecified lump in the left breast, unspecified quadrant: Secondary | ICD-10-CM

## 2013-06-29 DIAGNOSIS — N6489 Other specified disorders of breast: Secondary | ICD-10-CM | POA: Diagnosis not present

## 2013-07-05 ENCOUNTER — Other Ambulatory Visit: Payer: Medicare Other

## 2013-07-05 DIAGNOSIS — N186 End stage renal disease: Secondary | ICD-10-CM | POA: Diagnosis not present

## 2013-07-06 DIAGNOSIS — E119 Type 2 diabetes mellitus without complications: Secondary | ICD-10-CM | POA: Diagnosis not present

## 2013-07-06 DIAGNOSIS — D509 Iron deficiency anemia, unspecified: Secondary | ICD-10-CM | POA: Diagnosis not present

## 2013-07-06 DIAGNOSIS — D631 Anemia in chronic kidney disease: Secondary | ICD-10-CM | POA: Diagnosis not present

## 2013-07-06 DIAGNOSIS — N186 End stage renal disease: Secondary | ICD-10-CM | POA: Diagnosis not present

## 2013-07-12 ENCOUNTER — Ambulatory Visit
Admission: RE | Admit: 2013-07-12 | Discharge: 2013-07-12 | Disposition: A | Discharge status: Home / Self Care | Payer: Medicare Other | Source: Ambulatory Visit | Attending: Family Medicine | Admitting: Family Medicine

## 2013-07-12 ENCOUNTER — Other Ambulatory Visit: Payer: Self-pay | Admitting: Family Medicine

## 2013-07-12 DIAGNOSIS — N63 Unspecified lump in unspecified breast: Secondary | ICD-10-CM | POA: Diagnosis not present

## 2013-07-12 DIAGNOSIS — C50919 Malignant neoplasm of unspecified site of unspecified female breast: Secondary | ICD-10-CM | POA: Diagnosis not present

## 2013-07-12 DIAGNOSIS — N6310 Unspecified lump in the right breast, unspecified quadrant: Secondary | ICD-10-CM

## 2013-07-12 DIAGNOSIS — N6489 Other specified disorders of breast: Secondary | ICD-10-CM | POA: Diagnosis not present

## 2013-07-19 ENCOUNTER — Encounter (INDEPENDENT_AMBULATORY_CARE_PROVIDER_SITE_OTHER): Payer: Self-pay | Admitting: General Surgery

## 2013-07-19 ENCOUNTER — Other Ambulatory Visit (INDEPENDENT_AMBULATORY_CARE_PROVIDER_SITE_OTHER): Payer: Self-pay

## 2013-07-19 ENCOUNTER — Ambulatory Visit (INDEPENDENT_AMBULATORY_CARE_PROVIDER_SITE_OTHER): Payer: Medicare Other | Admitting: General Surgery

## 2013-07-19 VITALS — BP 138/82 | HR 74 | Temp 97.8°F | Resp 14 | Ht 63.0 in | Wt 109.8 lb

## 2013-07-19 DIAGNOSIS — C50519 Malignant neoplasm of lower-outer quadrant of unspecified female breast: Secondary | ICD-10-CM

## 2013-07-19 DIAGNOSIS — C50919 Malignant neoplasm of unspecified site of unspecified female breast: Secondary | ICD-10-CM

## 2013-07-19 DIAGNOSIS — C50511 Malignant neoplasm of lower-outer quadrant of right female breast: Secondary | ICD-10-CM | POA: Insufficient documentation

## 2013-07-19 NOTE — Progress Notes (Signed)
Patient ID: [name], female   DOB: 1932/12/01, 79 y.o.   MRN: [id number]  Chief Complaint  Patient presents with  . New Evaluation    New breast Cancer-eval R breast     Note: This dictation was prepared with Dragon/digital dictation along with [employer]. Any transcriptional errors that result from this process are unintentional.   HPI [name] is a 80 y.o. female.  She is referred by Dr. Shelly Bombard at the breast center [facility] for  management of a newly diagnosed invasive cancer of the right breast. Dr. Moshe Cipro and Dr. Posey Pronto are her nephrologists. Dr. Nancy Fetter is her PCP. Her daughter is with her throughout the encounter.  This patient underwent left mastectomy in 1988 by Dr. Hezzie Bump. She says she took pills for 5 years. She has not had a mammogram for 7 years. She has no pain and did not perceive the mass.  When she went for a new bra at [facility], they found a lump in her right breast. Right breast mammogram and right breast ultrasound were performed on 06/29/2013. This shows a highly suspicious 3.1 cm mass in the lower right breast with probable skin retraction and involvement of the subdermal layer. The right axilla looked normal. Ultrasound-guided biopsy of this right breast mass revealed invasive ductal carcinoma, ER +100%, PR -0%, HER-2-negative. She is not a candidate for MRI because she is on dialysis and her wheelchair bound disability.  She was discussed in breast cancer conference yesterday, and the consensus was that she should probably have a mastectomy and then consideration should be given to an aromatase inhibitor.  Comorbidities include end-stage renal disease on dialysis for 11 years, does dialysis Monday Wednesday Friday. She has no history of coronary disease, pulmonary disease, or cognitive disorders. She is wheelchair-bound following 6 level back surgery. She can stand with a walker but is unsteady and does not sit up well without  brace. Needs assistant. She has in [employer] research [employer], [employer]. . Her daughter is with her and lives nearby. She's had abdominal hysterectomy.   Family history is negative for breast or ovarian cancer. Mother had some type of GI cancer.  HPI  Past Medical History  Diagnosis Date  . ESRD (end stage renal disease)   . DM (diabetes mellitus)   . Hepatitis C   . Anemia   . PVD (peripheral vascular disease)   . CAD (coronary artery disease)   . Breast cancer   . Hyperparathyroidism   . Dyslipidemia     Past Surgical History  Procedure Laterality Date  . Cataract rt eye    . Mastectomy  (left)    . Abdominal hysterectomy    . Arteriovenous graft placement  03/20/09    revised 04/18/09  . Left median nerve decompression    . Cervical discectomy    . Toe amuputation- rt 1,2,3rd toes    . Dg av dialysis graft declot or  lua graft    01/27/11, 02/15/11, 03/15/11, 10/13/11    Family History  Problem Relation Age of Onset  . Cancer Mother     Social History History  Substance Use Topics  . Smoking status: Never Smoker   . Smokeless tobacco: Not on file  . Alcohol Use: No    No Known Allergies  Current Outpatient Prescriptions  Medication Sig Dispense Refill  . cinacalcet (SENSIPAR) 30 MG tablet Take 30 mg by mouth daily.       Marland Kitchen lanthanum (FOSRENOL) 1000 MG chewable tablet  Chew 1,000 mg by mouth 3 (three) times daily with meals.       . metoprolol tartrate (LOPRESSOR) 25 MG tablet Take 25 mg by mouth 2 (two) times daily.       . Pseudoeph-Doxylamine-DM-APAP (NYQUIL PO) Take 30 mLs by mouth at bedtime as needed (coughing).      . Throat Lozenges (COUGH DROPS MT) Use as directed 1 lozenge in the mouth or throat daily as needed.      Marland Kitchen b complex-vitamin c-folic acid (NEPHRO-VITE) 0.8 MG TABS Take 0.8 mg by mouth at bedtime.        No current facility-administered medications for this visit.    Review of Systems Review of Systems  Constitutional: Positive for fatigue. Negative for fever,  chills and unexpected weight change.  HENT: Negative for congestion, hearing loss, sore throat, trouble swallowing and voice change.   Eyes: Negative for visual disturbance.  Respiratory: Negative for cough and wheezing.   Cardiovascular: Negative for chest pain, palpitations and leg swelling.  Gastrointestinal: Negative for nausea, vomiting, abdominal pain, diarrhea, constipation, blood in stool, abdominal distention and anal bleeding.  Genitourinary: Negative for hematuria, vaginal bleeding and difficulty urinating.  Musculoskeletal: Positive for back pain and gait problem. Negative for arthralgias.  Skin: Positive for color change. Negative for rash and wound.  Neurological: Positive for weakness. Negative for seizures, syncope and headaches.  Hematological: Negative for adenopathy. Does not bruise/bleed easily.  Psychiatric/Behavioral: Negative for confusion.    Blood pressure 138/82, pulse 74, temperature 97.8 F (36.6 C), temperature source Temporal, resp. rate 14, height $RemoveBe'5\' 3"'[id number]$  (1.6 m), weight 109 lb 12.8 oz (49.805 kg).  Physical Exam Physical Exam  Constitutional: She is oriented to person, place, and time. She appears well-developed and well-nourished. No distress.  Pleasant. Alert. Good insight. No distress. Wheelchair.  HENT:  Head: Normocephalic and atraumatic.  Nose: Nose normal.  Mouth/Throat: No oropharyngeal exudate.  Eyes: Conjunctivae and EOM are normal. Pupils are equal, round, and reactive to light. Left eye exhibits no discharge. No scleral icterus.  Neck: Neck supple. No JVD present. No tracheal deviation present. No thyromegaly present.  Cardiovascular: Normal rate, regular rhythm, normal heart sounds and intact distal pulses.   No murmur heard. Functional AV graft left upper extremity with good pulse.Marland Kitchen Nonfunctional AV graft right upper extremity.  Pulmonary/Chest: Effort normal and breath sounds normal. No respiratory distress. She has no wheezes. She has no  rales. She exhibits no tenderness.    5 cm mass right breast, lateral and central. A little bit of skin fixation and discoloration anterolaterally but no ulceration. Mobile. Not fixed to chest wall. Axilla negative. Left mastectomy wound completely well healed no nodules or ulceration of left axillary mass.  Abdominal: Soft. Bowel sounds are normal. She exhibits no distension and no mass. There is no tenderness. There is no rebound and no guarding.  Musculoskeletal: She exhibits no edema and no tenderness.  Right shoulder contracture. Can abduct approximately 120, however.  Lymphadenopathy:    She has no cervical adenopathy.  Neurological: She is alert and oriented to person, place, and time. She exhibits normal muscle tone. Coordination abnormal.  Weakness of lower extremities but she does move her legs on command.  Skin: Skin is warm. No rash noted. She is not diaphoretic. No erythema. No pallor.  Psychiatric: She has a normal mood and affect. Her behavior is normal. Judgment and thought content normal.    Data Reviewed Mammograms. Ultrasound. Histology from biopsy. Case discussed at breast conference  yesterday  Assessment    Invasive cancer right breast, receptor positive, HER-2-negative. Clinical stage T2, N0. The tumor is large compared to the volume of the breast, and she is not a candidate for lumpectomy.  I do not think there is any indication for sentinel node biopsy.  End-stage renal disease  Right shoulder contracture.  Remote history left mastectomy for cancer, 1988, no evidence of local recurrence  Significant physical disability, wheelchair-bound secondary to back surgery  No history of cardiac, pulmonary, or cognitive disorder.     Plan    I had a very lengthy discussion with the patient and her daughter. I told them that I did not think that she was a candidate for lumpectomy and radiation therapy because of the dimensions of the tumor. We discussed the options  of proceeding with right total mastectomy and referral to a medical oncology postoperatively for consideration of aromatase inhibitor. We talked about primary aromatase inhibitor therapy. We talked about preoperative referral to medical oncology. We discussed these issues and their preferences for a great period of time and ultimately they agreed to go ahead with rightsimple mastectomy and postop medical oncology referral.  I discussed the indications, details, techniques, and numerous risks of the surgery with him. There where the risk of bleeding, infection, skin necrosis, or swelling, progressive arm contracture , Cardiac, pulmonary, and thromboembolic problems. They understand all these issues well. All their questions were answered. They agree with this plan.  Surgery we will be scheduled at [place] on a Tuesday or Thursday, because of her Monday Wednesday Friday dialysis schedule we will involve nephrology in her care.       [name]. Dalbert Batman, M.D., [facility] General and Minimally invasive Surgery Breast and Colorectal Surgery Office:   [phone] Pager:   [phone]  07/19/2013, 12:39 PM

## 2013-07-19 NOTE — Patient Instructions (Addendum)
You have an invasive cancer in the right breast. It is probably involving the deep layers of the skin.  The tumor is so large that you are not a candidate for a lumpectomy.  Dr. Dalbert Batman feels that the best treatment would be to do a right simple mastectomy and refer you to a medical oncologist for consideration of antiestrogen therapy.  We talked about referral to an oncologist, and he stated that she would prefer to go ahead with surgery and be referred postoperatively. That is reasonable  Youll be scheduled for right total m[facility]ctomy [facility] on a Tuesday or Thursday.     Mastectomy, With or Without Reconstruction Mastectomy (removal of the breast) is a procedure most commonly used to treat cancer (tumor) of the breast. Different procedures are available for treatment. This depends on the stage of the tumor (abnormal growths). Discuss this with your caregiver, surgeon (a specialist for performing operations such as this), or oncologist (someone specialized in the treatment of cancer). With proper information, you can decide which treatment is best for you. Although the sound of the word cancer is frightening to all of [place], the new treatments and medications can be a source of reassurance and comfort. If there are things you are worried about, discuss them with your caregiver. He or she can help comfort you and your family. Some of the different procedures for treating breast cancer are:  Radical (extensive) mastectomy. This is an operation used to remove the entire breast, the muscles under the breast, and all of the glands (lymph nodes) under the arm. With all of the new treatments available for cancer of the breast, this procedure has become less common.  Modified radical mastectomy. This is a similar operation to the radical mastectomy described above. In the modified radical mastectomy, the muscles of the chest wall are not removed unless one of the lessor muscles is removed. One of the lessor  muscles may be removed to allow better removal of the lymph nodes. The axillary lymph nodes are also removed. Rarely, during an axillary node dissection nerves to this area are damaged. Radiation therapy is then often used to the area following this surgery.  A total mastectomy also known as a complete or simple mastectomy. It involves removal of only the breast. The lymph nodes and the muscles are left in place.  In a lumpectomy, the lump is removed from the breast. This is the simplest form of surgical treatment. A sentinel lymph node biopsy may also be done. Additional treatment may be required. RISKS AND COMPLICATIONS The main problems that follow removal of the breast include:  Infection (germs start growing in the wound). This can usually be treated with antibiotics (medications that kill germs).  Lymphedema. This means the arm on the side of the breast that was operated on swells because the lymph (tissue fluid) cannot follow the main channels back into the body. This only occurs when the lymph nodes have had to be removed under the arm.  There may be some areas of numbness to the upper arm and around the incision (cut by the surgeon) in the breast. This happens because of the cutting of or damage to some of the nerves in the area. This is most often unavoidable.  There may be difficulty moving the arm in a full range of motion (moving in all directions) following surgery. This usually improves with time following use and exercise.  Recurrence of breast cancer may happen with the very best of surgery and  follow up treatment. Sometimes small cancer cells that cannot be seen with the naked eye have already spread at the time of surgery. When this happens other treatment is available. This treatment may be radiation, medications or a combination of both. RECONSTRUCTION Reconstruction of the breast may be done immediately if there is not going to be post-operative radiation. This surgery is done  for cosmetic (improve appearance) purposes to improve the physical appearance after the operation. This may be done in two ways:  It can be done using a saline filled prosthetic (an artificial breast which is filled with salt water). Silicone breast implants are now re-approved by the FDA and are being commonly used.  Reconstruction can be done using the body's own muscle/fat/skin. Your caregiver will discuss your options with you. Depending upon your needs or choice, together you will be able to determine which procedure is best for you. Document Released: 11/17/2000 Document Revised: 11/17/2011 Document Reviewed: 07/11/2007 [facility] Patient Information 2014 [place].

## 2013-07-24 ENCOUNTER — Encounter (HOSPITAL_COMMUNITY): Payer: Self-pay | Admitting: Pharmacy Technician

## 2013-07-25 DIAGNOSIS — E1129 Type 2 diabetes mellitus with other diabetic kidney complication: Secondary | ICD-10-CM | POA: Diagnosis not present

## 2013-07-26 ENCOUNTER — Encounter (HOSPITAL_COMMUNITY)
Admission: RE | Admit: 2013-07-26 | Discharge: 2013-07-26 | Disposition: A | Discharge status: Home / Self Care | Payer: Medicare Other | Source: Ambulatory Visit | Attending: General Surgery | Admitting: General Surgery

## 2013-07-26 ENCOUNTER — Encounter (HOSPITAL_COMMUNITY)
Admission: RE | Admit: 2013-07-26 | Discharge: 2013-07-26 | Disposition: A | Discharge status: Home / Self Care | Payer: Medicare Other | Source: Ambulatory Visit | Attending: Anesthesiology | Admitting: Anesthesiology

## 2013-07-26 ENCOUNTER — Encounter (HOSPITAL_COMMUNITY): Payer: Self-pay

## 2013-07-26 DIAGNOSIS — Z01810 Encounter for preprocedural cardiovascular examination: Secondary | ICD-10-CM | POA: Diagnosis not present

## 2013-07-26 DIAGNOSIS — Z01818 Encounter for other preprocedural examination: Secondary | ICD-10-CM | POA: Insufficient documentation

## 2013-07-26 DIAGNOSIS — Z01812 Encounter for preprocedural laboratory examination: Secondary | ICD-10-CM | POA: Insufficient documentation

## 2013-07-26 DIAGNOSIS — D709 Neutropenia, unspecified: Secondary | ICD-10-CM | POA: Diagnosis not present

## 2013-07-26 HISTORY — DX: Essential (primary) hypertension: I10

## 2013-07-26 LAB — CBC WITH DIFFERENTIAL/PLATELET
BASOS ABS: 0 10*3/uL (ref 0.0–0.1)
Basophils Relative: 1 % (ref 0–1)
Eosinophils Absolute: 0.1 10*3/uL (ref 0.0–0.7)
Eosinophils Relative: 3 % (ref 0–5)
HEMATOCRIT: 36.9 % (ref 36.0–46.0)
Hemoglobin: 12.5 g/dL (ref 12.0–15.0)
Lymphocytes Relative: 53 % — ABNORMAL HIGH (ref 12–46)
Lymphs Abs: 1.4 10*3/uL (ref 0.7–4.0)
MCH: 32.6 pg (ref 26.0–34.0)
MCHC: 33.9 g/dL (ref 30.0–36.0)
MCV: 96.3 fL (ref 78.0–100.0)
MONO ABS: 0.3 10*3/uL (ref 0.1–1.0)
Monocytes Relative: 9 % (ref 3–12)
NEUTROS ABS: 1 10*3/uL — AB (ref 1.7–7.7)
Neutrophils Relative %: 34 % — ABNORMAL LOW (ref 43–77)
PLATELETS: 105 10*3/uL — AB (ref 150–400)
RBC: 3.83 MIL/uL — ABNORMAL LOW (ref 3.87–5.11)
RDW: 13.9 % (ref 11.5–15.5)
SMEAR REVIEW: DECREASED
WBC: 2.8 10*3/uL — ABNORMAL LOW (ref 4.0–10.5)

## 2013-07-26 LAB — COMPREHENSIVE METABOLIC PANEL
ALBUMIN: 2.9 g/dL — AB (ref 3.5–5.2)
ALT: 26 U/L (ref 0–35)
AST: 40 U/L — AB (ref 0–37)
Alkaline Phosphatase: 100 U/L (ref 39–117)
BILIRUBIN TOTAL: 0.5 mg/dL (ref 0.3–1.2)
BUN: 23 mg/dL (ref 6–23)
CALCIUM: 10 mg/dL (ref 8.4–10.5)
CO2: 33 meq/L — AB (ref 19–32)
CREATININE: 4.37 mg/dL — AB (ref 0.50–1.10)
Chloride: 93 mEq/L — ABNORMAL LOW (ref 96–112)
GFR calc Af Amer: 10 mL/min — ABNORMAL LOW (ref >90)
GFR, EST NON AFRICAN AMERICAN: 9 mL/min — AB (ref >90)
Glucose, Bld: 79 mg/dL (ref 70–99)
Potassium: 4 mEq/L (ref 3.7–5.3)
Sodium: 138 mEq/L (ref 137–147)
Total Protein: 6.7 g/dL (ref 6.0–8.3)

## 2013-07-26 LAB — APTT: aPTT: 35 seconds (ref 24–37)

## 2013-07-26 LAB — PROTIME-INR
INR: 1 (ref 0.00–1.49)
Prothrombin Time: 13 seconds (ref 11.6–15.2)

## 2013-07-26 NOTE — Pre-Procedure Instructions (Signed)
[name]  07/26/2013   Your procedure is scheduled on:  08-07-2013  Tuesday   Report to [facility] Admitting at 11:00 AM.   Call this number if you have problems the morning of surgery: [phone]   Remember:   Do not eat food or drink liquids after midnight.    Take these medicines the morning of surgery with A SIP OF WATER: amlodipine(Norvasc),cinacalcet(Sensipar),clonzepam(Klonopin)Metoprol(Loprossor     Do not wear jewelry, make-up or nail polish.  Do not wear lotions, powders, or perfumes.   Do not shave 48 hours prior to surgery. .  Do not bring valuables to the hospital.  [facility] is not responsible  for any belongings or valuables.               Contacts, dentures or bridgework may not be worn into surgery.   Leave suitcase in the car. After surgery it may be brought to your room .  For patients admitted to the hospital, discharge time is determined by your   treatment team.               Patients discharged the day of surgery will not be allowed to drive home.     Special Instructions: See attached sheet for instructions of CHG showers/bath   Please read over the following fact sheets that you were given: Pain Booklet, Coughing and Deep Breathing and Surgical Site Infection Prevention

## 2013-07-27 LAB — PATHOLOGIST SMEAR REVIEW

## 2013-07-31 NOTE — Progress Notes (Addendum)
Anesthesia Chart Review: Patient is a 80 year old female scheduled for right total mastectomy on 08/07/13 by Dr. Dalbert Batman.  History includes ESRD (HD MWF), secondary hyperparathyroidism, breast cancer s/p left mastectomy '88 and diagnosed with right breast cancer '15, hepatitis C, anemia, diet controlled DM2, PVD (s/p toe amputations), back surgery, ACDF 04/06/06, multiple hemodialysis access procedures, left CTR 09/2006, right eye pars plana vitrectomy '05.  Dr. Darrel Hoover notes indicate that patient is wheelchair bound.  PCP is Dr. Nancy Fetter.  Nephrologist is Dr. Moshe Cipro.  EKG on 07/26/13 showed NSR, LAD, left BBB.  EKG was not felt significantly changed since prior tracing on 03/20/09.  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 (Old left BBB EKG findings reviewed with anesthesiologist Dr. Glennon Mac.)  CXR on 07/26/13 showed: No acute cardiopulmonary disease.  Preoperative labs noted and are marked as reviewed by Dr. Dalbert Batman.  Her WBC was 2.8 and PLT 105K. Comparison labs available are > 1 year ago. I'll request comparison labs from [facility] ([facility]).  If results are new, than could consider a repeat CBC with diff on arrival.  For now, will only order an ISTAT on arrival. According to Dr. Darrel Hoover notes, she will get post-operative medical oncology referral.  George Hugh [facility] Stay [facility]/Anesthesiology Phone [phone] 07/31/2013 5:53 PM  Addendum: 08/07/13 12:02 PM I received repeat labs done thru [facility] from 08/01/13.  WBC was 3.04, netrophils 39.4%, PLT 152K, H/H 11/33.  Will plan for just an ISTAT on arrival.

## 2013-08-02 NOTE — Progress Notes (Signed)
Received call from Gregory. Instructed to call [facility] for info needed.  Attempted to call [facility] without success (no answer at any extension). Left message, will call 08/03/2013.

## 2013-08-02 NOTE — Progress Notes (Signed)
Left msg. At [facility], asked that they faxed comparison of labs & last OV note. To be faxed to [phone] or [phone]

## 2013-08-05 DIAGNOSIS — N186 End stage renal disease: Secondary | ICD-10-CM | POA: Diagnosis not present

## 2013-08-06 DIAGNOSIS — D509 Iron deficiency anemia, unspecified: Secondary | ICD-10-CM | POA: Diagnosis not present

## 2013-08-06 DIAGNOSIS — N039 Chronic nephritic syndrome with unspecified morphologic changes: Secondary | ICD-10-CM | POA: Diagnosis not present

## 2013-08-06 DIAGNOSIS — N2581 Secondary hyperparathyroidism of renal origin: Secondary | ICD-10-CM | POA: Diagnosis not present

## 2013-08-06 DIAGNOSIS — N186 End stage renal disease: Secondary | ICD-10-CM | POA: Diagnosis not present

## 2013-08-06 DIAGNOSIS — D631 Anemia in chronic kidney disease: Secondary | ICD-10-CM | POA: Diagnosis not present

## 2013-08-06 DIAGNOSIS — E119 Type 2 diabetes mellitus without complications: Secondary | ICD-10-CM | POA: Diagnosis not present

## 2013-08-06 MED ORDER — CEFAZOLIN SODIUM-DEXTROSE 2-3 GM-% IV SOLR
2.0000 g | INTRAVENOUS | Status: AC
  Administered 2013-08-07: 2 g via INTRAVENOUS
  Filled 2013-08-06: qty 50

## 2013-08-06 MED ORDER — CHLORHEXIDINE GLUCONATE 4 % EX LIQD
1.0000 "application " | Freq: Once | CUTANEOUS | Status: DC
  Filled 2013-08-06: qty 15

## 2013-08-06 NOTE — H&P (Signed)
[name]   MRN:  [id number]   Description: 80 year old female  Provider: [name], MD  Department: [facility]           Diagnoses      Breast cancer    -  Primary      174.9      Breast cancer of lower-outer quadrant of right female breast          174.5             Reason for Visit      New Evaluation      New breast Cancer-eval R breast              Current Vitals Most recent update: 07/19/2013 11:51 AM by Elwyn Lade, CMA      BP Pulse Temp(Src) Resp Ht Wt      138/82 74 97.8 F (36.6 C) (Temporal) 14 _0  (1.6 m) 109 lb 12.8 oz (49.805 kg)      BMI 19.46 kg/m2                  Vitals History Recorded        History and Physical   [name], MD      Status: Signed            Patient ID: [name], female   DOB: January 25, 1933, 80 y.o.   MRN: [id number]                Note:  This dictation was prepared with Dragon/digital dictation along with [facility] technology. Any transcriptional errors that result from this process are unintentional.     HPI [name] is a 79 y.o. female.  She is referred by Dr. Shelly Bombard at the breast center [facility] for  management of a newly diagnosed invasive cancer of the right breast. Dr. Moshe Cipro and Dr. Posey Pronto are her nephrologists. Dr. Nancy Fetter is her PCP. Her daughter is with her throughout the encounter.   This patient underwent left mastectomy in 1988 by Dr. Hezzie Bump. She says she took pills for 5 years. She has not had a mammogram for 7 years. She has no pain and did not perceive the mass.   When she went for a new bra at [facility], they found a lump in her right breast. Right breast mammogram and right breast ultrasound were performed on 06/29/2013. This shows a highly suspicious 3.1 cm mass in the lower right breast with probable skin retraction and involvement of the subdermal layer. The right axilla looked normal. Ultrasound-guided biopsy of this right breast mass  revealed invasive ductal carcinoma, ER +100%, PR -0%, HER-2-negative. She is not a candidate for MRI because she is on dialysis and her wheelchair bound disability.   She was discussed in breast cancer conference yesterday, and the consensus was that she should probably have a mastectomy and then consideration should be given to an aromatase inhibitor.   Comorbidities include end-stage renal disease on dialysis for 11 years, does dialysis Monday Wednesday Friday. She has no history of coronary disease, pulmonary disease, or cognitive disorders. She is wheelchair-bound following 6 level back surgery. She can stand with a walker but is unsteady and does not sit up well without brace. Needs assistant. She has in Social research officer, government. . Her daughter is with her and lives nearby. She's had abdominal hysterectomy.   Family history is negative for breast or ovarian cancer. Mother had  some type of GI cancer.         Past Medical History   Diagnosis  Date   .  ESRD (end stage renal disease)     .  DM (diabetes mellitus)     .  Hepatitis C     .  Anemia     .  PVD (peripheral vascular disease)     .  CAD (coronary artery disease)     .  Breast cancer     .  Hyperparathyroidism     .  Dyslipidemia           Past Surgical History   Procedure  Laterality  Date   .  Cataract rt eye       .  Mastectomy  (left)       .  Abdominal hysterectomy       .  Arteriovenous graft placement    03/20/09       revised 04/18/09   .  Left median nerve decompression       .  Cervical discectomy       .  Toe amuputation- rt 1,2,3rd toes       .  Dg av dialysis graft declot or    lua graft       01/27/11, 02/15/11, 03/15/11, 10/13/11         Family History   Problem  Relation  Age of Onset   .  Cancer  Mother          Social History History   Substance Use Topics   .  Smoking status:  Never Smoker    .  Smokeless tobacco:  Not on file   .  Alcohol Use:  No        No Known Allergies     Current Outpatient Prescriptions   Medication  Sig  Dispense  Refill   .  cinacalcet (SENSIPAR) 30 MG tablet  Take 30 mg by mouth daily.          Marland Kitchen  lanthanum (FOSRENOL) 1000 MG chewable tablet  Chew 1,000 mg by mouth 3 (three) times daily with meals.          .  metoprolol tartrate (LOPRESSOR) 25 MG tablet  Take 25 mg by mouth 2 (two) times daily.          .  Pseudoeph-Doxylamine-DM-APAP (NYQUIL PO)  Take 30 mLs by mouth at bedtime as needed (coughing).         .  Throat Lozenges (COUGH DROPS MT)  Use as directed 1 lozenge in the mouth or throat daily as needed.         Marland Kitchen  b complex-vitamin c-folic acid (NEPHRO-VITE) 0.8 MG TABS  Take 0.8 mg by mouth at bedtime.                    Review of Systems s  Constitutional: Positive for fatigue. Negative for fever, chills and unexpected weight change.  HENT: Negative for congestion, hearing loss, sore throat, trouble swallowing and voice change.   Eyes: Negative for visual disturbance.  Respiratory: Negative for cough and wheezing.   Cardiovascular: Negative for chest pain, palpitations and leg swelling.  Gastrointestinal: Negative for nausea, vomiting, abdominal pain, diarrhea, constipation, blood in stool, abdominal distention and anal bleeding.  Genitourinary: Negative for hematuria, vaginal bleeding and difficulty urinating.  Musculoskeletal: Positive for back pain and gait problem. Negative for arthralgias.  Skin: Positive for color change. Negative for rash  and wound.  Neurological: Positive for weakness. Negative for seizures, syncope and headaches.  Hematological: Negative for adenopathy. Does not bruise/bleed easily.  Psychiatric/Behavioral: Negative for confusion.      Blood pressure 138/82, pulse 74, temperature 97.8 F (36.6 C), temperature source Temporal, resp. rate 14, height _0  (1.6 m), weight 109 lb 12.8 oz (49.805 kg).   Physical Exam  Constitutional: She is oriented to person, place, and time. She appears  well-developed and well-nourished. No distress.  Pleasant. Alert. Good insight. No distress. Wheelchair.  HENT:   Head: Normocephalic and atraumatic.   Nose: Nose normal.   Mouth/Throat: No oropharyngeal exudate.  Eyes: Conjunctivae and EOM are normal. Pupils are equal, round, and reactive to light. Left eye exhibits no discharge. No scleral icterus.  Neck: Neck supple. No JVD present. No tracheal deviation present. No thyromegaly present.  Cardiovascular: Normal rate, regular rhythm, normal heart sounds and intact distal pulses.    No murmur heard. Functional AV graft left upper extremity with good pulse.Marland Kitchen Nonfunctional AV graft right upper extremity.  Pulmonary/Chest: Effort normal and breath sounds normal. No respiratory distress. She has no wheezes. She has no rales. She exhibits no tenderness.    5 cm mass right breast, lateral and central. A little bit of skin fixation and discoloration anterolaterally but no ulceration. Mobile. Not fixed to chest wall. Axilla negative. Left mastectomy wound completely well healed no nodules or ulceration of left axillary mass.  Abdominal: Soft. Bowel sounds are normal. She exhibits no distension and no mass. There is no tenderness. There is no rebound and no guarding.  Musculoskeletal: She exhibits no edema and no tenderness.  Right shoulder contracture. Can abduct approximately 120, however.  Lymphadenopathy:    She has no cervical adenopathy.  Neurological: She is alert and oriented to person, place, and time. She exhibits normal muscle tone. Coordination abnormal.  Weakness of lower extremities but she does move her legs on command.  Skin: Skin is warm. No rash noted. She is not diaphoretic. No erythema. No pallor.  Psychiatric: She has a normal mood and affect. Her behavior is normal. Judgment and thought content normal.      Data Reviewed Mammograms. Ultrasound. Histology from biopsy. Case discussed at breast conference  yesterday   Assessment    Invasive cancer right breast, receptor positive, HER-2-negative. Clinical stage T2, N0. The tumor is large compared to the volume of the breast, and she is not a candidate for lumpectomy.   I do not think there is any indication for sentinel node biopsy.   End-stage renal disease   Right shoulder contracture.   Remote history left mastectomy for cancer, 1988, no evidence of local recurrence   Significant physical disability, wheelchair-bound secondary to back surgery   No history of cardiac, pulmonary, or cognitive disorder.      Plan    I had a very lengthy discussion with the patient and her daughter. I told them that I did not think that she was a candidate for lumpectomy and radiation therapy because of the dimensions of the tumor. We discussed the options of proceeding with right total mastectomy and referral to a medical oncology postoperatively for consideration of aromatase inhibitor. We talked about primary aromatase inhibitor therapy. We talked about preoperative referral to medical oncology. We discussed these issues and their preferences for a great period of time and ultimately they agreed to go ahead with rightsimple mastectomy and postop medical oncology referral.   I discussed the indications, details, techniques, and  numerous risks of the surgery with him. There where the risk of bleeding, infection, skin necrosis, or swelling, progressive arm contracture , Cardiac, pulmonary, and thromboembolic problems. They understand all these issues well. All their questions were answered. They agree with this plan.   Surgery  will be scheduled at [place] on a Tuesday or Thursday, because of her Monday Wednesday Friday dialysis schedule we will involve nephrology in her care.          [name]. Dalbert Batman, M.D., [facility] General and Minimally invasive Surgery Breast and Colorectal Surgery Office:    [phone] Pager:   [phone]

## 2013-08-07 ENCOUNTER — Ambulatory Visit (HOSPITAL_COMMUNITY): Payer: Medicare Other | Admitting: Anesthesiology

## 2013-08-07 ENCOUNTER — Encounter (HOSPITAL_COMMUNITY): Payer: Self-pay | Admitting: General Practice

## 2013-08-07 ENCOUNTER — Inpatient Hospital Stay (HOSPITAL_COMMUNITY)
Admission: RE | Admit: 2013-08-07 | Discharge: 2013-08-14 | DRG: 582 | Disposition: A | Discharge status: Home Health | Payer: Medicare Other | Source: Ambulatory Visit | Attending: General Surgery | Admitting: General Surgery

## 2013-08-07 ENCOUNTER — Encounter (HOSPITAL_COMMUNITY): Payer: Medicare Other | Admitting: Vascular Surgery

## 2013-08-07 ENCOUNTER — Encounter (HOSPITAL_COMMUNITY)
Admission: RE | Disposition: A | Discharge status: Home Health | Payer: Self-pay | Source: Ambulatory Visit | Attending: General Surgery

## 2013-08-07 DIAGNOSIS — D62 Acute posthemorrhagic anemia: Secondary | ICD-10-CM | POA: Diagnosis not present

## 2013-08-07 DIAGNOSIS — Z79899 Other long term (current) drug therapy: Secondary | ICD-10-CM | POA: Diagnosis not present

## 2013-08-07 DIAGNOSIS — C50519 Malignant neoplasm of lower-outer quadrant of unspecified female breast: Secondary | ICD-10-CM | POA: Diagnosis not present

## 2013-08-07 DIAGNOSIS — E119 Type 2 diabetes mellitus without complications: Secondary | ICD-10-CM | POA: Diagnosis not present

## 2013-08-07 DIAGNOSIS — IMO0002 Reserved for concepts with insufficient information to code with codable children: Secondary | ICD-10-CM

## 2013-08-07 DIAGNOSIS — Z992 Dependence on renal dialysis: Secondary | ICD-10-CM | POA: Diagnosis not present

## 2013-08-07 DIAGNOSIS — C50919 Malignant neoplasm of unspecified site of unspecified female breast: Secondary | ICD-10-CM | POA: Diagnosis not present

## 2013-08-07 DIAGNOSIS — R5381 Other malaise: Secondary | ICD-10-CM | POA: Diagnosis not present

## 2013-08-07 DIAGNOSIS — Y9210 Unspecified residential institution as the place of occurrence of the external cause: Secondary | ICD-10-CM | POA: Diagnosis not present

## 2013-08-07 DIAGNOSIS — D0590 Unspecified type of carcinoma in situ of unspecified breast: Secondary | ICD-10-CM | POA: Diagnosis not present

## 2013-08-07 DIAGNOSIS — E1142 Type 2 diabetes mellitus with diabetic polyneuropathy: Secondary | ICD-10-CM | POA: Diagnosis not present

## 2013-08-07 DIAGNOSIS — C50911 Malignant neoplasm of unspecified site of right female breast: Secondary | ICD-10-CM | POA: Diagnosis present

## 2013-08-07 DIAGNOSIS — S98119A Complete traumatic amputation of unspecified great toe, initial encounter: Secondary | ICD-10-CM | POA: Diagnosis not present

## 2013-08-07 DIAGNOSIS — Y836 Removal of other organ (partial) (total) as the cause of abnormal reaction of the patient, or of later complication, without mention of misadventure at the time of the procedure: Secondary | ICD-10-CM | POA: Diagnosis not present

## 2013-08-07 DIAGNOSIS — I2510 Atherosclerotic heart disease of native coronary artery without angina pectoris: Secondary | ICD-10-CM | POA: Diagnosis present

## 2013-08-07 DIAGNOSIS — F17200 Nicotine dependence, unspecified, uncomplicated: Secondary | ICD-10-CM | POA: Diagnosis present

## 2013-08-07 DIAGNOSIS — I739 Peripheral vascular disease, unspecified: Secondary | ICD-10-CM | POA: Diagnosis not present

## 2013-08-07 DIAGNOSIS — Z993 Dependence on wheelchair: Secondary | ICD-10-CM

## 2013-08-07 DIAGNOSIS — E785 Hyperlipidemia, unspecified: Secondary | ICD-10-CM | POA: Diagnosis present

## 2013-08-07 DIAGNOSIS — Z9010 Acquired absence of unspecified breast and nipple: Secondary | ICD-10-CM | POA: Diagnosis not present

## 2013-08-07 DIAGNOSIS — N186 End stage renal disease: Secondary | ICD-10-CM

## 2013-08-07 DIAGNOSIS — Z9889 Other specified postprocedural states: Secondary | ICD-10-CM

## 2013-08-07 DIAGNOSIS — D696 Thrombocytopenia, unspecified: Secondary | ICD-10-CM | POA: Diagnosis present

## 2013-08-07 DIAGNOSIS — N2581 Secondary hyperparathyroidism of renal origin: Secondary | ICD-10-CM | POA: Diagnosis present

## 2013-08-07 DIAGNOSIS — I120 Hypertensive chronic kidney disease with stage 5 chronic kidney disease or end stage renal disease: Secondary | ICD-10-CM | POA: Diagnosis not present

## 2013-08-07 DIAGNOSIS — K5900 Constipation, unspecified: Secondary | ICD-10-CM | POA: Diagnosis not present

## 2013-08-07 DIAGNOSIS — S21009A Unspecified open wound of unspecified breast, initial encounter: Secondary | ICD-10-CM | POA: Diagnosis not present

## 2013-08-07 DIAGNOSIS — E1149 Type 2 diabetes mellitus with other diabetic neurological complication: Secondary | ICD-10-CM | POA: Diagnosis present

## 2013-08-07 DIAGNOSIS — S98139A Complete traumatic amputation of one unspecified lesser toe, initial encounter: Secondary | ICD-10-CM | POA: Diagnosis not present

## 2013-08-07 DIAGNOSIS — D631 Anemia in chronic kidney disease: Secondary | ICD-10-CM | POA: Diagnosis not present

## 2013-08-07 DIAGNOSIS — D649 Anemia, unspecified: Secondary | ICD-10-CM | POA: Diagnosis not present

## 2013-08-07 DIAGNOSIS — K219 Gastro-esophageal reflux disease without esophagitis: Secondary | ICD-10-CM | POA: Diagnosis not present

## 2013-08-07 DIAGNOSIS — B1920 Unspecified viral hepatitis C without hepatic coma: Secondary | ICD-10-CM | POA: Diagnosis present

## 2013-08-07 DIAGNOSIS — C50511 Malignant neoplasm of lower-outer quadrant of right female breast: Secondary | ICD-10-CM | POA: Diagnosis present

## 2013-08-07 DIAGNOSIS — G8918 Other acute postprocedural pain: Secondary | ICD-10-CM | POA: Diagnosis not present

## 2013-08-07 HISTORY — PX: MASTECTOMY COMPLETE / SIMPLE: SUR845

## 2013-08-07 HISTORY — PX: TOTAL MASTECTOMY: SHX6129

## 2013-08-07 HISTORY — DX: Dependence on renal dialysis: Z99.2

## 2013-08-07 HISTORY — DX: Dependence on renal dialysis: N18.6

## 2013-08-07 HISTORY — DX: Unspecified osteoarthritis, unspecified site: M19.90

## 2013-08-07 LAB — CREATININE, SERUM
CREATININE: 5.26 mg/dL — AB (ref 0.50–1.10)
GFR, EST AFRICAN AMERICAN: 8 mL/min — AB (ref >90)
GFR, EST NON AFRICAN AMERICAN: 7 mL/min — AB (ref >90)

## 2013-08-07 LAB — POCT I-STAT 4, (NA,K, GLUC, HGB,HCT)
Glucose, Bld: 78 mg/dL (ref 70–99)
HCT: 41 % (ref 36.0–46.0)
HEMOGLOBIN: 13.9 g/dL (ref 12.0–15.0)
Potassium: 4 mEq/L (ref 3.7–5.3)
Sodium: 136 mEq/L — ABNORMAL LOW (ref 137–147)

## 2013-08-07 LAB — CBC
HEMATOCRIT: 30.4 % — AB (ref 36.0–46.0)
HEMOGLOBIN: 10.5 g/dL — AB (ref 12.0–15.0)
MCH: 32.4 pg (ref 26.0–34.0)
MCHC: 34.5 g/dL (ref 30.0–36.0)
MCV: 93.8 fL (ref 78.0–100.0)
Platelets: 139 10*3/uL — ABNORMAL LOW (ref 150–400)
RBC: 3.24 MIL/uL — ABNORMAL LOW (ref 3.87–5.11)
RDW: 13.9 % (ref 11.5–15.5)
WBC: 7.5 10*3/uL (ref 4.0–10.5)

## 2013-08-07 LAB — GLUCOSE, CAPILLARY: Glucose-Capillary: 73 mg/dL (ref 70–99)

## 2013-08-07 SURGERY — MASTECTOMY, SIMPLE
Anesthesia: General | Site: Chest | Laterality: Right

## 2013-08-07 MED ORDER — SODIUM CHLORIDE 0.9 % IV SOLN
INTRAVENOUS | Status: DC
  Administered 2013-08-07 – 2013-08-08 (×4): via INTRAVENOUS

## 2013-08-07 MED ORDER — PROPOFOL 10 MG/ML IV BOLUS
INTRAVENOUS | Status: AC
  Filled 2013-08-07: qty 20

## 2013-08-07 MED ORDER — LANTHANUM CARBONATE 500 MG PO CHEW
1000.0000 mg | CHEWABLE_TABLET | Freq: Three times a day (TID) | ORAL | Status: DC
  Administered 2013-08-07: 1000 mg via ORAL
  Filled 2013-08-07 (×5): qty 2

## 2013-08-07 MED ORDER — PROPOFOL 10 MG/ML IV BOLUS
INTRAVENOUS | Status: DC | PRN
  Administered 2013-08-07: 100 mg via INTRAVENOUS

## 2013-08-07 MED ORDER — EPHEDRINE SULFATE 50 MG/ML IJ SOLN
INTRAMUSCULAR | Status: DC | PRN
  Administered 2013-08-07 (×2): 15 mg via INTRAVENOUS
  Administered 2013-08-07: 10 mg via INTRAVENOUS

## 2013-08-07 MED ORDER — METOPROLOL TARTRATE 25 MG PO TABS
25.0000 mg | ORAL_TABLET | Freq: Once | ORAL | Status: AC
  Administered 2013-08-07: 25 mg via ORAL

## 2013-08-07 MED ORDER — MEPERIDINE HCL 25 MG/ML IJ SOLN: 6.2500 mg | INTRAMUSCULAR | Status: DC | PRN

## 2013-08-07 MED ORDER — PHENYLEPHRINE HCL 10 MG/ML IJ SOLN
INTRAMUSCULAR | Status: DC | PRN
  Administered 2013-08-07: 80 ug via INTRAVENOUS
  Administered 2013-08-07 (×2): 120 ug via INTRAVENOUS
  Administered 2013-08-07: 80 ug via INTRAVENOUS

## 2013-08-07 MED ORDER — AMLODIPINE BESYLATE 5 MG PO TABS
5.0000 mg | ORAL_TABLET | Freq: Every day | ORAL | Status: DC
  Administered 2013-08-07: 5 mg via ORAL
  Filled 2013-08-07 (×2): qty 1

## 2013-08-07 MED ORDER — FENTANYL CITRATE 0.05 MG/ML IJ SOLN
INTRAMUSCULAR | Status: AC
  Administered 2013-08-07: 50 ug
  Filled 2013-08-07: qty 2

## 2013-08-07 MED ORDER — HYDROMORPHONE HCL PF 1 MG/ML IJ SOLN: 0.2500 mg | INTRAMUSCULAR | Status: DC | PRN

## 2013-08-07 MED ORDER — BUPIVACAINE-EPINEPHRINE (PF) 0.5% -1:200000 IJ SOLN
INTRAMUSCULAR | Status: DC | PRN
  Administered 2013-08-07: 30 mL

## 2013-08-07 MED ORDER — ONDANSETRON HCL 4 MG/2ML IJ SOLN: 4.0000 mg | Freq: Four times a day (QID) | INTRAMUSCULAR | Status: DC | PRN

## 2013-08-07 MED ORDER — CINACALCET HCL 30 MG PO TABS
30.0000 mg | ORAL_TABLET | Freq: Two times a day (BID) | ORAL | Status: DC
  Administered 2013-08-07: 30 mg via ORAL
  Filled 2013-08-07 (×4): qty 1

## 2013-08-07 MED ORDER — NEOSTIGMINE METHYLSULFATE 10 MG/10ML IV SOLN
INTRAVENOUS | Status: DC | PRN
  Administered 2013-08-07: 3 mg via INTRAVENOUS

## 2013-08-07 MED ORDER — ROCURONIUM BROMIDE 50 MG/5ML IV SOLN
INTRAVENOUS | Status: AC
  Filled 2013-08-07: qty 1

## 2013-08-07 MED ORDER — ONDANSETRON HCL 4 MG/2ML IJ SOLN: 4.0000 mg | Freq: Once | INTRAMUSCULAR | Status: DC | PRN

## 2013-08-07 MED ORDER — SODIUM CHLORIDE 0.9 % IV SOLN
INTRAVENOUS | Status: DC | PRN
Start: 2013-08-07 — End: 2013-08-07
  Administered 2013-08-07 (×2): via INTRAVENOUS

## 2013-08-07 MED ORDER — SODIUM CHLORIDE 0.9 % IV SOLN
INTRAVENOUS | Status: DC
  Administered 2013-08-07: 11:00:00 via INTRAVENOUS

## 2013-08-07 MED ORDER — LIDOCAINE HCL (CARDIAC) 20 MG/ML IV SOLN
INTRAVENOUS | Status: DC | PRN
  Administered 2013-08-07: 60 mg via INTRAVENOUS

## 2013-08-07 MED ORDER — ROCURONIUM BROMIDE 100 MG/10ML IV SOLN
INTRAVENOUS | Status: DC | PRN
  Administered 2013-08-07: 25 mg via INTRAVENOUS

## 2013-08-07 MED ORDER — 0.9 % SODIUM CHLORIDE (POUR BTL) OPTIME
TOPICAL | Status: DC | PRN
  Administered 2013-08-07: 1000 mL

## 2013-08-07 MED ORDER — DARBEPOETIN ALFA-POLYSORBATE 25 MCG/0.42ML IJ SOLN
25.0000 ug | INTRAMUSCULAR | Status: AC
  Administered 2013-08-08: 25 ug via INTRAVENOUS
  Filled 2013-08-07: qty 0.42

## 2013-08-07 MED ORDER — GLYCOPYRROLATE 0.2 MG/ML IJ SOLN
INTRAMUSCULAR | Status: DC | PRN
  Administered 2013-08-07: 0.4 mg via INTRAVENOUS

## 2013-08-07 MED ORDER — FENTANYL CITRATE 0.05 MG/ML IJ SOLN
INTRAMUSCULAR | Status: DC | PRN
  Administered 2013-08-07: 150 ug via INTRAVENOUS

## 2013-08-07 MED ORDER — IBUPROFEN 200 MG PO TABS: 200.0000 mg | ORAL_TABLET | Freq: Four times a day (QID) | ORAL | Status: DC | PRN

## 2013-08-07 MED ORDER — ENOXAPARIN SODIUM 30 MG/0.3ML ~~LOC~~ SOLN
30.0000 mg | SUBCUTANEOUS | Status: DC
  Filled 2013-08-07: qty 0.3

## 2013-08-07 MED ORDER — ONDANSETRON HCL 4 MG PO TABS: 4.0000 mg | ORAL_TABLET | Freq: Four times a day (QID) | ORAL | Status: DC | PRN

## 2013-08-07 MED ORDER — RENA-VITE PO TABS
1.0000 | ORAL_TABLET | Freq: Every day | ORAL | Status: DC
  Administered 2013-08-07: 1 via ORAL
  Filled 2013-08-07 (×3): qty 1

## 2013-08-07 MED ORDER — METOPROLOL TARTRATE 12.5 MG HALF TABLET
ORAL_TABLET | ORAL | Status: AC
  Filled 2013-08-07: qty 2

## 2013-08-07 MED ORDER — MIDAZOLAM HCL 2 MG/2ML IJ SOLN
INTRAMUSCULAR | Status: AC
  Administered 2013-08-07: 1 mg
  Filled 2013-08-07: qty 2

## 2013-08-07 MED ORDER — METOPROLOL TARTRATE 25 MG PO TABS
25.0000 mg | ORAL_TABLET | Freq: Two times a day (BID) | ORAL | Status: DC
  Filled 2013-08-07 (×3): qty 1

## 2013-08-07 MED ORDER — PHENYLEPHRINE HCL 10 MG/ML IJ SOLN
10.0000 mg | INTRAVENOUS | Status: DC | PRN
  Administered 2013-08-07: 50 ug/min via INTRAVENOUS

## 2013-08-07 MED ORDER — OXYCODONE HCL 5 MG PO TABS: 5.0000 mg | ORAL_TABLET | Freq: Once | ORAL | Status: DC | PRN

## 2013-08-07 MED ORDER — ONDANSETRON HCL 4 MG/2ML IJ SOLN
INTRAMUSCULAR | Status: DC | PRN
  Administered 2013-08-07: 4 mg via INTRAVENOUS

## 2013-08-07 MED ORDER — HYDROCODONE-ACETAMINOPHEN 5-325 MG PO TABS: 1.0000 | ORAL_TABLET | ORAL | Status: DC | PRN

## 2013-08-07 MED ORDER — FENTANYL CITRATE 0.05 MG/ML IJ SOLN
INTRAMUSCULAR | Status: AC
  Filled 2013-08-07: qty 5

## 2013-08-07 MED ORDER — BENZONATATE 100 MG PO CAPS: 100.0000 mg | ORAL_CAPSULE | Freq: Three times a day (TID) | ORAL | Status: DC | PRN

## 2013-08-07 MED ORDER — MORPHINE SULFATE 2 MG/ML IJ SOLN: 1.0000 mg | INTRAMUSCULAR | Status: DC | PRN

## 2013-08-07 MED ORDER — ENOXAPARIN SODIUM 30 MG/0.3ML ~~LOC~~ SOLN: 30.0000 mg | SUBCUTANEOUS | Status: DC

## 2013-08-07 MED ORDER — OXYCODONE HCL 5 MG/5ML PO SOLN: 5.0000 mg | Freq: Once | ORAL | Status: DC | PRN

## 2013-08-07 MED ORDER — CLONAZEPAM 0.5 MG PO TABS: 0.5000 mg | ORAL_TABLET | Freq: Two times a day (BID) | ORAL | Status: DC | PRN

## 2013-08-07 SURGICAL SUPPLY — 39 items
ADH SKN CLS APL DERMABOND .7 (GAUZE/BANDAGES/DRESSINGS) ×2
APPLIER CLIP 9.375 MED OPEN (MISCELLANEOUS) ×3
APR CLP MED 9.3 20 MLT OPN (MISCELLANEOUS) ×1
BINDER BREAST LRG (GAUZE/BANDAGES/DRESSINGS) ×2 IMPLANT
BINDER BREAST XLRG (GAUZE/BANDAGES/DRESSINGS) IMPLANT
CANISTER SUCTION 2500CC (MISCELLANEOUS) ×3 IMPLANT
CHLORAPREP W/TINT 26ML (MISCELLANEOUS) ×3 IMPLANT
CLIP APPLIE 9.375 MED OPEN (MISCELLANEOUS) ×1 IMPLANT
COVER SURGICAL LIGHT HANDLE (MISCELLANEOUS) ×3 IMPLANT
DERMABOND ADVANCED (GAUZE/BANDAGES/DRESSINGS) ×4
DERMABOND ADVANCED .7 DNX12 (GAUZE/BANDAGES/DRESSINGS) ×1 IMPLANT
DRAIN CHANNEL 19F RND (DRAIN) ×3 IMPLANT
DRAPE LAPAROSCOPIC ABDOMINAL (DRAPES) ×3 IMPLANT
DRAPE UTILITY 15X26 W/TAPE STR (DRAPE) ×6 IMPLANT
DRSG PAD ABDOMINAL 8X10 ST (GAUZE/BANDAGES/DRESSINGS) ×2 IMPLANT
ELECT BLADE 4.0 EZ CLEAN MEGAD (MISCELLANEOUS) ×6
ELECT CAUTERY BLADE 6.4 (BLADE) ×3 IMPLANT
ELECT REM PT RETURN 9FT ADLT (ELECTROSURGICAL) ×3
ELECTRODE BLDE 4.0 EZ CLN MEGD (MISCELLANEOUS) ×1 IMPLANT
ELECTRODE REM PT RTRN 9FT ADLT (ELECTROSURGICAL) ×1 IMPLANT
EVACUATOR SILICONE 100CC (DRAIN) ×3 IMPLANT
GLOVE EUDERMIC 7 POWDERFREE (GLOVE) ×3 IMPLANT
GOWN STRL REUS W/ TWL LRG LVL3 (GOWN DISPOSABLE) ×2 IMPLANT
GOWN STRL REUS W/ TWL XL LVL3 (GOWN DISPOSABLE) ×1 IMPLANT
GOWN STRL REUS W/TWL LRG LVL3 (GOWN DISPOSABLE) ×6
GOWN STRL REUS W/TWL XL LVL3 (GOWN DISPOSABLE) ×3
KIT BASIN OR (CUSTOM PROCEDURE TRAY) ×3 IMPLANT
KIT ROOM TURNOVER OR (KITS) ×3 IMPLANT
NS IRRIG 1000ML POUR BTL (IV SOLUTION) ×3 IMPLANT
PACK GENERAL/GYN (CUSTOM PROCEDURE TRAY) ×3 IMPLANT
PAD ABD 8X10 STRL (GAUZE/BANDAGES/DRESSINGS) ×2 IMPLANT
PAD ARMBOARD 7.5X6 YLW CONV (MISCELLANEOUS) ×3 IMPLANT
SPECIMEN JAR X LARGE (MISCELLANEOUS) ×3 IMPLANT
SUT ETHILON 3 0 FSL (SUTURE) ×3 IMPLANT
SUT MNCRL AB 4-0 PS2 18 (SUTURE) ×3 IMPLANT
SUT SILK 2 0 FS (SUTURE) ×3 IMPLANT
SUT VIC AB 3-0 SH 18 (SUTURE) ×5 IMPLANT
TOWEL OR 17X24 6PK STRL BLUE (TOWEL DISPOSABLE) ×3 IMPLANT
TOWEL OR 17X26 10 PK STRL BLUE (TOWEL DISPOSABLE) ×3 IMPLANT

## 2013-08-07 NOTE — Transfer of Care (Signed)
Immediate Anesthesia Transfer of Care Note  Patient: [name]  Procedure(s) Performed: Procedure(s): RIGHT TOTAL MASTECTOMY (Right)  Patient Location: PACU  Anesthesia Type:General  Level of Consciousness: awake and alert   Airway & Oxygen Therapy: Patient Spontanous Breathing and Patient connected to nasal cannula oxygen  Post-op Assessment: Report given to PACU RN and Post -op Vital signs reviewed and stable  Post vital signs: Reviewed  Complications: No apparent anesthesia complications

## 2013-08-07 NOTE — Anesthesia Preprocedure Evaluation (Signed)
Anesthesia Evaluation  Patient identified by MRN, date of birth, ID band Patient awake    Reviewed: Allergy & Precautions, H&P , NPO status , Patient's Chart, lab work & pertinent test results  Airway Mallampati: I TM Distance: >3 FB Neck ROM: Full    Dental   Pulmonary          Cardiovascular hypertension, Pt. on medications     Neuro/Psych    GI/Hepatic (+) Hepatitis -, C  Endo/Other  diabetes  Renal/GU ESRF and DialysisRenal disease     Musculoskeletal   Abdominal   Peds  Hematology   Anesthesia Other Findings   Reproductive/Obstetrics                           Anesthesia Physical Anesthesia Plan  ASA: III  Anesthesia Plan: General   Post-op Pain Management:    Induction: Intravenous  Airway Management Planned: Oral ETT  Additional Equipment:   Intra-op Plan:   Post-operative Plan: Extubation in OR  Informed Consent: I have reviewed the patients History and Physical, chart, labs and discussed the procedure including the risks, benefits and alternatives for the proposed anesthesia with the patient or authorized representative who has indicated his/her understanding and acceptance.     Plan Discussed with: CRNA and Surgeon  Anesthesia Plan Comments:         Anesthesia Quick Evaluation

## 2013-08-07 NOTE — Interval H&P Note (Signed)
History and Physical Interval Note:  08/07/2013 1:06 PM  [name]  has presented today for surgery, with the diagnosis of right breast cancer  The goals and the various methods of treatment have been discussed with the patient and family. After consideration of risks, benefits and other options for treatment, the patient has consented to  Procedure(s): RIGHT TOTAL MASTECTOMY (Right) as a surgical intervention .  The patient's history has been reviewed, patient examined today, no change in status, stable for surgery.  I have reviewed the patient's chart and labs.  Questions were answered to the patient's satisfaction.     [name]

## 2013-08-07 NOTE — Anesthesia Procedure Notes (Addendum)
Anesthesia Regional Block:  Pectoralis block  Pre-Anesthetic Checklist: ,, timeout performed, Correct Patient, Correct Site, Correct Laterality, Correct Procedure, Correct Position, site marked, Risks and benefits discussed, Surgical consent,  Pre-op evaluation,  Post-op pain management  Laterality: Right  Prep: chloraprep       Needles:  Injection technique: Single-shot     Needle Length: 9cm 9 cm Needle Gauge: 21 and 21 G    Additional Needles:  Procedures: ultrasound guided (picture in chart) Pectoralis block Narrative:  Start time: 08/07/2013 12:30 PM End time: 08/07/2013 12:40 PM Injection made incrementally with aspirations every 5 mL.  Performed by: Personally   Additional Notes: Monitors applied. Patient sedated. Sterile prep and drape,hand hygiene and sterile gloves were used. Relevant anatomy identified.Needle position confirmed.Local anesthetic injected incrementally after negative aspiration. Local anesthetic spread visualized between muscles. Vascular puncture avoided. No complications. Image printed for medical record.The patient tolerated the procedure well.

## 2013-08-07 NOTE — Progress Notes (Signed)
SBP 193-198/ 57-66, SR with BBB rate in the 60's. Pt denies pain. CBG 74-pt says she feels fine. Dr Conrad Tichigan updated-no new orders. Will cont to monitor.

## 2013-08-07 NOTE — Consult Note (Signed)
Renal Service Consult Note [facility]  [name] 08/07/2013 [name] Requesting Physician:  Dr Dalbert Batman  Reason for Consult:  ESRD patient had mastectomy today HPI: The patient is a 80 y.o. year-old with hx of ESRD, L breast Ca s/p mastectomy, PVD, hep C and HTN who was recently dx'd with R invasive breast cancer with a 5 cm mass.  She presented today and had R mastectomy.  She gets HD on a MWF schedule, she says she does not miss treatments.  She has no complaints postop.  ROS  no cp, sob  no jt pain  no abd pain, n/v/d  no dysuria, makes little urine  on HD for about 10 years  Past Medical History  Past Medical History  Diagnosis Date  . Hepatitis C   . Anemia   . PVD (peripheral vascular disease)   . Breast cancer   . Hyperparathyroidism   . Dyslipidemia   . ESRD (end stage renal disease)     dialysis M-W-F  . Hypertension   . DM (diabetes mellitus)     no medications due to dialysis   Past Surgical History  Past Surgical History  Procedure Laterality Date  . Cataract rt eye    . Mastectomy  (left)    . Abdominal hysterectomy    . Arteriovenous graft placement  03/20/09    revised 04/18/09  . Left median nerve decompression    . Cervical discectomy    . Toe amuputation- rt 1,2,3rd toes    . Dg av dialysis graft declot or  lua graft    01/27/11, 02/15/11, 03/15/11, 10/13/11   Family History  Family History  Problem Relation Age of Onset  . Cancer Mother    Social History  reports that she has never smoked. She does not have any smokeless tobacco history on file. She reports that she does not drink alcohol or use illicit drugs. Allergies No Known Allergies Home medications Prior to Admission medications   Medication Sig Start Date End Date Taking? Authorizing Provider  amLODipine (NORVASC) 5 MG tablet Take 5 mg by mouth daily.   Yes Historical Provider, MD  b complex-vitamin c-folic acid (NEPHRO-VITE) 0.8 MG TABS Take 0.8 mg by mouth at  bedtime.    Yes Historical Provider, MD  benzonatate (TESSALON) 100 MG capsule Take 100 mg by mouth 3 (three) times daily as needed for cough.   Yes Historical Provider, MD  cinacalcet (SENSIPAR) 30 MG tablet Take 30 mg by mouth 2 (two) times daily with a meal.    Yes Historical Provider, MD  clonazePAM (KLONOPIN) 0.5 MG tablet Take 0.5 mg by mouth 2 (two) times daily as needed for anxiety.   Yes Historical Provider, MD  lanthanum (FOSRENOL) 1000 MG chewable tablet Chew 1,000 mg by mouth 3 (three) times daily with meals.    Yes Historical Provider, MD  metoprolol tartrate (LOPRESSOR) 25 MG tablet Take 25 mg by mouth 2 (two) times daily.    Yes Historical Provider, MD  ibuprofen (ADVIL,MOTRIN) 200 MG tablet Take 200 mg by mouth every 6 (six) hours as needed for fever, headache or mild pain.    Historical Provider, MD   Liver Function Tests No results found for this basename: AST, ALT, ALKPHOS, BILITOT, PROT, ALBUMIN,  in the last 168 hours No results found for this basename: LIPASE, AMYLASE,  in the last 168 hours CBC  Recent Labs Lab 08/07/13 1112  HGB 13.9  HCT 01.6   Basic Metabolic  Panel  Recent Labs Lab 08/07/13 1112  NA 136*  K 4.0  GLUCOSE 78    Filed Vitals:   08/07/13 1536 08/07/13 1549 08/07/13 1556 08/07/13 1558  BP: 148/63 157/53    Pulse: 63   61  Temp:   97 F (36.1 C)   TempSrc:      Resp: 20  20 16   Height:      Weight:      SpO2: 100%   100%   Exam: Alert, lying at 30 deg, no distress, calm No rash, cyanosis or gangrene Sclera anicteric, throat clear No jvd Chest clear bilat RRR 2/ 6 SEM rusb, no rub Abd soft , NTND, no ascites R breast area with dressing in place and a JP drain Prior L mastectomy LUA AVG good bruit Neuro is nf, Ox 3   Dialysis: [facility] 3h 62min   F160  51kg   2/2.0 Bath   PRof 4   400/A1.5   Heparin 1700   LUA AVG Aranesp 25 ug every 2 weeks on Wed, last 5/20      Venofer 50 mg / wk   Assessment: 1 R invasive breast  cancer, s/p R mastectomy today 2 ESRD on HD 3 HTN/vol cont norvasc, metoprolol, volume stable 4 HPTH cont sensipar, binder 5 Anemia cont aranesp  6 Hx prior L breast Ca- treated   Plan- HD tomorrow, no heparin   Heather Splinter MD (pgr) [phone]    (c) [phone] 08/07/2013, 4:07 PM

## 2013-08-07 NOTE — Op Note (Signed)
Patient Name:           [name]   Note: This dictation was prepared with Dragon/digital dictation along with [employer]. Any transcriptional errors that result from this process are unintentional.   Date of Surgery:        08/07/2013  Pre op Diagnosis:      Invasive cancer right breast, receptor positive, HER-2-negative. Clinical stage T2, N0  Post op Diagnosis:    same  Procedure:                 Right total mastectomy  Surgeon:                     [name]. Dalbert Batman, M.D., FACS  Assistant:                      RNFA  Operative Indications:   [name] is a 80 y.o. female. She is referred by Dr. Shelly Bombard at the breast center [facility] for management of a newly diagnosed invasive cancer of the right breast. Dr. Moshe Cipro and Dr. Posey Pronto are her nephrologists. Dr. Nancy Fetter is her PCP. Marland Kitchen     This patient underwent left mastectomy in 1988 by Dr. Hezzie Bump. She says she took pills for 5 years. She has not had a mammogram for 7 years. She has no pain and did not perceive the mass.  When she went for a new bra at [facility], they found a lump in her right breast. Right breast mammogram and right breast ultrasound were performed on 06/29/2013. This shows a highly suspicious 3.1 cm mass in the lower right breast with probable skin retraction and involvement of the subdermal layer. The right axilla looked normal. Ultrasound-guided biopsy of this right breast mass revealed invasive ductal carcinoma, ER +100%, PR -0%, HER-2-negative. Examination reveals a 4-5 cm mass in the right breast central and lateral and  There is a little skin fixation anterolaterally but no ulceration. This does not appear to be fixed the pectoralis muscle. The axilla is clinically negative. Left mastectomy wound is well healed without signs of  recurrent cancer.  She was discussed in breast cancer conference , and the consensus was that she should probably have a mastectomy and then consideration should be  given to an aromatase inhibitor.  Comorbidities include end-stage renal disease on dialysis for 11 years, does dialysis Monday Wednesday Friday. She has no history of coronary disease, pulmonary disease, or cognitive disorders.  She is wheelchair-bound following 6 level back surgery. She can stand with a walker but is unsteady and does not sit up well without brace. Needs assistant. She has in Social research officer, government. . Her daughter is with her and lives nearby. She's had abdominal hysterectomy.  Family history is negative for breast or ovarian cancer.    Operative Findings:       The tumor was not fixed to the pectoralis muscle and we felt that we had a clean posterior margin. We were able to place skin incisions to stay away from the tumor and we felt that we got clean margins and superiorly and inferiorly.  Procedure in Detail:          Following the induction of general endotracheal anesthesia the patient's right chest wall and axilla and lateral back were prepped and draped in a sterile fashion. Surgical time out was performed. Intravenous antibiotics were given. Using a marking pen I marked a transverse elliptical incision so as  to optimize margins. The transverse elliptical incision encompassed the nipple areolar complex. Incision was made with a knife. . Skin flaps were raised superiorly to the infraclavicular area, medially to the parasternal area, inferiorly to the anterior rectus sheath, and laterally to the latissimus dorsi muscle.  The breast was then dissected off of the pectoralis major and minor muscle taking the fascia with it. The lateral skin margin was marked with a silk suture and the specimen was sent to the pathology lab. Hemostasis was excellent and achieved with electrocautery. Wound was irrigated with saline. A 19 [place] Blake drain was placed across the upper and lower skin flaps and brought out through a separate stab incision inferolaterally. This was sutured to the skin with a nylon  suture. The subcutaneous tissue was closed with interrupted sutures of 3-0 Vicryl and skin closed with a running subcuticular suture of 4-0 Monocryl and Dermabond. Breast binder was placed and the patient taken to PACU in stable condition. EBL 50 cc. Counts correct. Complications none.     [name]. Dalbert Batman, M.D., FACS General and Minimally Invasive Surgery Breast and Colorectal Surgery  08/07/2013 2:51 PM

## 2013-08-08 ENCOUNTER — Encounter (HOSPITAL_COMMUNITY): Payer: Self-pay | Admitting: Anesthesiology

## 2013-08-08 ENCOUNTER — Encounter (HOSPITAL_COMMUNITY)
Admission: RE | Disposition: A | Discharge status: Home Health | Payer: Self-pay | Source: Ambulatory Visit | Attending: General Surgery

## 2013-08-08 ENCOUNTER — Telehealth (INDEPENDENT_AMBULATORY_CARE_PROVIDER_SITE_OTHER): Payer: Self-pay

## 2013-08-08 ENCOUNTER — Encounter (HOSPITAL_COMMUNITY): Payer: Medicare Other | Admitting: Anesthesiology

## 2013-08-08 ENCOUNTER — Inpatient Hospital Stay (HOSPITAL_COMMUNITY): Payer: Medicare Other | Admitting: Anesthesiology

## 2013-08-08 DIAGNOSIS — IMO0002 Reserved for concepts with insufficient information to code with codable children: Secondary | ICD-10-CM | POA: Diagnosis not present

## 2013-08-08 DIAGNOSIS — C50919 Malignant neoplasm of unspecified site of unspecified female breast: Secondary | ICD-10-CM | POA: Diagnosis not present

## 2013-08-08 DIAGNOSIS — N186 End stage renal disease: Secondary | ICD-10-CM | POA: Diagnosis not present

## 2013-08-08 DIAGNOSIS — C50519 Malignant neoplasm of lower-outer quadrant of unspecified female breast: Secondary | ICD-10-CM | POA: Diagnosis not present

## 2013-08-08 DIAGNOSIS — E1142 Type 2 diabetes mellitus with diabetic polyneuropathy: Secondary | ICD-10-CM | POA: Diagnosis not present

## 2013-08-08 DIAGNOSIS — I120 Hypertensive chronic kidney disease with stage 5 chronic kidney disease or end stage renal disease: Secondary | ICD-10-CM | POA: Diagnosis not present

## 2013-08-08 DIAGNOSIS — D631 Anemia in chronic kidney disease: Secondary | ICD-10-CM | POA: Diagnosis not present

## 2013-08-08 DIAGNOSIS — E119 Type 2 diabetes mellitus without complications: Secondary | ICD-10-CM | POA: Diagnosis not present

## 2013-08-08 DIAGNOSIS — S21009A Unspecified open wound of unspecified breast, initial encounter: Secondary | ICD-10-CM | POA: Diagnosis not present

## 2013-08-08 DIAGNOSIS — D0590 Unspecified type of carcinoma in situ of unspecified breast: Secondary | ICD-10-CM | POA: Diagnosis not present

## 2013-08-08 DIAGNOSIS — K219 Gastro-esophageal reflux disease without esophagitis: Secondary | ICD-10-CM | POA: Diagnosis not present

## 2013-08-08 HISTORY — PX: BREAST BIOPSY: SHX20

## 2013-08-08 LAB — APTT: APTT: 35 s (ref 24–37)

## 2013-08-08 LAB — CBC
HEMATOCRIT: 25.2 % — AB (ref 36.0–46.0)
HEMATOCRIT: 21.1 % — AB (ref 36.0–46.0)
HEMOGLOBIN: 7.2 g/dL — AB (ref 12.0–15.0)
Hemoglobin: 8.6 g/dL — ABNORMAL LOW (ref 12.0–15.0)
MCH: 32.3 pg (ref 26.0–34.0)
MCH: 32.4 pg (ref 26.0–34.0)
MCHC: 34.1 g/dL (ref 30.0–36.0)
MCHC: 34.1 g/dL (ref 30.0–36.0)
MCV: 94.7 fL (ref 78.0–100.0)
MCV: 95 fL (ref 78.0–100.0)
Platelets: 126 10*3/uL — ABNORMAL LOW (ref 150–400)
Platelets: 152 10*3/uL (ref 150–400)
RBC: 2.66 MIL/uL — ABNORMAL LOW (ref 3.87–5.11)
RBC: 2.22 MIL/uL — ABNORMAL LOW (ref 3.87–5.11)
RDW: 14 % (ref 11.5–15.5)
RDW: 14.5 % (ref 11.5–15.5)
WBC: 6.3 10*3/uL (ref 4.0–10.5)
WBC: 4.3 10*3/uL (ref 4.0–10.5)

## 2013-08-08 LAB — GLUCOSE, CAPILLARY
Glucose-Capillary: 123 mg/dL — ABNORMAL HIGH (ref 70–99)
Glucose-Capillary: 112 mg/dL — ABNORMAL HIGH (ref 70–99)

## 2013-08-08 LAB — BASIC METABOLIC PANEL
BUN: 39 mg/dL — ABNORMAL HIGH (ref 6–23)
CHLORIDE: 94 meq/L — AB (ref 96–112)
CO2: 28 mEq/L (ref 19–32)
Calcium: 8.5 mg/dL (ref 8.4–10.5)
Creatinine, Ser: 5.36 mg/dL — ABNORMAL HIGH (ref 0.50–1.10)
GFR calc Af Amer: 8 mL/min — ABNORMAL LOW (ref >90)
GFR calc non Af Amer: 7 mL/min — ABNORMAL LOW (ref >90)
Glucose, Bld: 145 mg/dL — ABNORMAL HIGH (ref 70–99)
Potassium: 4.2 mEq/L (ref 3.7–5.3)
Sodium: 136 mEq/L — ABNORMAL LOW (ref 137–147)

## 2013-08-08 LAB — PROTIME-INR
INR: 1.19 (ref 0.00–1.49)
PROTHROMBIN TIME: 14.8 s (ref 11.6–15.2)

## 2013-08-08 LAB — ABO/RH: ABO/RH(D): B POS

## 2013-08-08 SURGERY — BREAST BIOPSY
Anesthesia: General | Site: Chest | Laterality: Right

## 2013-08-08 MED ORDER — NEPRO/CARBSTEADY PO LIQD: 237.0000 mL | ORAL | Status: DC | PRN

## 2013-08-08 MED ORDER — PROPOFOL 10 MG/ML IV BOLUS
INTRAVENOUS | Status: AC
  Filled 2013-08-08: qty 20

## 2013-08-08 MED ORDER — HEPARIN SODIUM (PORCINE) 1000 UNIT/ML DIALYSIS: 1000.0000 [IU] | INTRAMUSCULAR | Status: DC | PRN

## 2013-08-08 MED ORDER — SODIUM CHLORIDE 0.9 % IR SOLN
Status: DC | PRN
  Administered 2013-08-08: 3000 mL

## 2013-08-08 MED ORDER — BENZONATATE 100 MG PO CAPS
100.0000 mg | ORAL_CAPSULE | Freq: Three times a day (TID) | ORAL | Status: DC | PRN
Start: 2013-08-08 — End: 2013-08-14

## 2013-08-08 MED ORDER — CEFAZOLIN SODIUM-DEXTROSE 2-3 GM-% IV SOLR
2.0000 g | Freq: Once | INTRAVENOUS | Status: AC
  Administered 2013-08-09: 2 g via INTRAVENOUS
  Filled 2013-08-08: qty 50

## 2013-08-08 MED ORDER — ALTEPLASE 2 MG IJ SOLR: 2.0000 mg | Freq: Once | INTRAMUSCULAR | Status: DC | PRN

## 2013-08-08 MED ORDER — SODIUM CHLORIDE 0.9 % IV SOLN: INTRAVENOUS | Status: DC

## 2013-08-08 MED ORDER — CEFAZOLIN SODIUM-DEXTROSE 2-3 GM-% IV SOLR
2.0000 g | INTRAVENOUS | Status: AC
  Administered 2013-08-08: 2 g via INTRAVENOUS
  Filled 2013-08-08 (×2): qty 50

## 2013-08-08 MED ORDER — FENTANYL CITRATE 0.05 MG/ML IJ SOLN: 12.5000 ug | INTRAMUSCULAR | Status: DC | PRN

## 2013-08-08 MED ORDER — ONDANSETRON HCL 4 MG PO TABS: 4.0000 mg | ORAL_TABLET | Freq: Four times a day (QID) | ORAL | Status: DC | PRN

## 2013-08-08 MED ORDER — DARBEPOETIN ALFA-POLYSORBATE 25 MCG/0.42ML IJ SOLN
INTRAMUSCULAR | Status: AC
  Administered 2013-08-08: 25 ug via INTRAVENOUS
  Filled 2013-08-08: qty 0.42

## 2013-08-08 MED ORDER — ONDANSETRON HCL 4 MG/2ML IJ SOLN
INTRAMUSCULAR | Status: DC | PRN
  Administered 2013-08-08: 4 mg via INTRAVENOUS

## 2013-08-08 MED ORDER — RENA-VITE PO TABS
1.0000 | ORAL_TABLET | Freq: Every day | ORAL | Status: DC
  Administered 2013-08-08 – 2013-08-11 (×4): 1 via ORAL
  Administered 2013-08-12: 23:00:00 via ORAL
  Administered 2013-08-13: 1 via ORAL
  Filled 2013-08-08 (×7): qty 1

## 2013-08-08 MED ORDER — LIDOCAINE HCL (PF) 1 % IJ SOLN
5.0000 mL | INTRAMUSCULAR | Status: DC | PRN
Start: 2013-08-08 — End: 2013-08-08

## 2013-08-08 MED ORDER — PENTAFLUOROPROP-TETRAFLUOROETH EX AERO
1.0000 | INHALATION_SPRAY | CUTANEOUS | Status: DC | PRN
Start: 2013-08-08 — End: 2013-08-08

## 2013-08-08 MED ORDER — PHENYLEPHRINE HCL 10 MG/ML IJ SOLN
INTRAMUSCULAR | Status: DC | PRN
  Administered 2013-08-08 (×7): 80 ug via INTRAVENOUS

## 2013-08-08 MED ORDER — 0.9 % SODIUM CHLORIDE (POUR BTL) OPTIME
TOPICAL | Status: DC | PRN
  Administered 2013-08-08: 1000 mL

## 2013-08-08 MED ORDER — LIDOCAINE HCL (CARDIAC) 20 MG/ML IV SOLN
INTRAVENOUS | Status: DC | PRN
  Administered 2013-08-08: 80 mg via INTRAVENOUS

## 2013-08-08 MED ORDER — ONDANSETRON HCL 4 MG/2ML IJ SOLN: 4.0000 mg | Freq: Four times a day (QID) | INTRAMUSCULAR | Status: DC | PRN

## 2013-08-08 MED ORDER — SUCCINYLCHOLINE CHLORIDE 20 MG/ML IJ SOLN
INTRAMUSCULAR | Status: DC | PRN
  Administered 2013-08-08: 50 mg via INTRAVENOUS

## 2013-08-08 MED ORDER — METOPROLOL TARTRATE 25 MG PO TABS
25.0000 mg | ORAL_TABLET | Freq: Two times a day (BID) | ORAL | Status: DC
  Administered 2013-08-09: 25 mg via ORAL
  Filled 2013-08-08 (×2): qty 1

## 2013-08-08 MED ORDER — AMLODIPINE BESYLATE 5 MG PO TABS
5.0000 mg | ORAL_TABLET | Freq: Every day | ORAL | Status: DC
  Administered 2013-08-08 – 2013-08-14 (×5): 5 mg via ORAL
  Filled 2013-08-08 (×6): qty 1

## 2013-08-08 MED ORDER — SODIUM CHLORIDE 0.9 % IV SOLN: 100.0000 mL | INTRAVENOUS | Status: DC | PRN

## 2013-08-08 MED ORDER — FENTANYL CITRATE 0.05 MG/ML IJ SOLN
INTRAMUSCULAR | Status: DC | PRN
  Administered 2013-08-08: 100 ug via INTRAVENOUS

## 2013-08-08 MED ORDER — LIDOCAINE-PRILOCAINE 2.5-2.5 % EX CREA
1.0000 | TOPICAL_CREAM | CUTANEOUS | Status: DC | PRN
Start: 2013-08-08 — End: 2013-08-08

## 2013-08-08 MED ORDER — CLONAZEPAM 0.5 MG PO TABS
0.5000 mg | ORAL_TABLET | Freq: Two times a day (BID) | ORAL | Status: DC | PRN
Start: 2013-08-08 — End: 2013-08-14

## 2013-08-08 MED ORDER — CINACALCET HCL 30 MG PO TABS
30.0000 mg | ORAL_TABLET | Freq: Two times a day (BID) | ORAL | Status: DC
  Administered 2013-08-09 – 2013-08-14 (×10): 30 mg via ORAL
  Filled 2013-08-08 (×13): qty 1

## 2013-08-08 MED ORDER — LANTHANUM CARBONATE 500 MG PO CHEW
1000.0000 mg | CHEWABLE_TABLET | Freq: Three times a day (TID) | ORAL | Status: DC
  Administered 2013-08-09 – 2013-08-14 (×15): 1000 mg via ORAL
  Filled 2013-08-08 (×19): qty 2

## 2013-08-08 MED ORDER — PROPOFOL 10 MG/ML IV BOLUS
INTRAVENOUS | Status: DC | PRN
  Administered 2013-08-08: 100 mg via INTRAVENOUS

## 2013-08-08 MED ORDER — FENTANYL CITRATE 0.05 MG/ML IJ SOLN
INTRAMUSCULAR | Status: AC
  Filled 2013-08-08: qty 5

## 2013-08-08 SURGICAL SUPPLY — 51 items
ADH SKN CLS APL DERMABOND .7 (GAUZE/BANDAGES/DRESSINGS)
BINDER BREAST LRG (GAUZE/BANDAGES/DRESSINGS) ×2 IMPLANT
BINDER BREAST XLRG (GAUZE/BANDAGES/DRESSINGS) IMPLANT
CANISTER SUCTION 2500CC (MISCELLANEOUS) ×3 IMPLANT
CHLORAPREP W/TINT 26ML (MISCELLANEOUS) ×3 IMPLANT
CONT SPEC 4OZ CLIKSEAL STRL BL (MISCELLANEOUS) IMPLANT
COVER SURGICAL LIGHT HANDLE (MISCELLANEOUS) ×3 IMPLANT
DECANTER SPIKE VIAL GLASS SM (MISCELLANEOUS) ×3 IMPLANT
DERMABOND ADVANCED (GAUZE/BANDAGES/DRESSINGS)
DERMABOND ADVANCED .7 DNX12 (GAUZE/BANDAGES/DRESSINGS) ×1 IMPLANT
DEVICE DUBIN SPECIMEN MAMMOGRA (MISCELLANEOUS) IMPLANT
DRAIN CHANNEL 19F RND (DRAIN) ×4 IMPLANT
DRAPE CHEST BREAST 15X10 FENES (DRAPES) ×3 IMPLANT
DRAPE UTILITY 15X26 W/TAPE STR (DRAPE) ×6 IMPLANT
DRSG ADAPTIC 3X8 NADH LF (GAUZE/BANDAGES/DRESSINGS) ×2 IMPLANT
ELECT CAUTERY BLADE 6.4 (BLADE) ×3 IMPLANT
ELECT REM PT RETURN 9FT ADLT (ELECTROSURGICAL) ×3
ELECTRODE REM PT RTRN 9FT ADLT (ELECTROSURGICAL) ×1 IMPLANT
EVACUATOR SILICONE 100CC (DRAIN) ×4 IMPLANT
GAUZE SPONGE 4X4 16PLY XRAY LF (GAUZE/BANDAGES/DRESSINGS) ×3 IMPLANT
GLOVE EUDERMIC 7 POWDERFREE (GLOVE) ×3 IMPLANT
GOWN STRL REUS W/ TWL LRG LVL3 (GOWN DISPOSABLE) ×1 IMPLANT
GOWN STRL REUS W/ TWL XL LVL3 (GOWN DISPOSABLE) ×1 IMPLANT
GOWN STRL REUS W/TWL LRG LVL3 (GOWN DISPOSABLE) ×3
GOWN STRL REUS W/TWL XL LVL3 (GOWN DISPOSABLE) ×3
KIT BASIN OR (CUSTOM PROCEDURE TRAY) ×3 IMPLANT
KIT MARKER MARGIN INK (KITS) ×1 IMPLANT
KIT ROOM TURNOVER OR (KITS) ×3 IMPLANT
NDL HYPO 25GX1X1/2 BEV (NEEDLE) ×1 IMPLANT
NEEDLE HYPO 25GX1X1/2 BEV (NEEDLE) IMPLANT
NS IRRIG 1000ML POUR BTL (IV SOLUTION) ×3 IMPLANT
PACK SURGICAL SETUP 50X90 (CUSTOM PROCEDURE TRAY) ×3 IMPLANT
PAD ABD 8X10 STRL (GAUZE/BANDAGES/DRESSINGS) ×8 IMPLANT
PAD ARMBOARD 7.5X6 YLW CONV (MISCELLANEOUS) ×6 IMPLANT
PENCIL BUTTON HOLSTER BLD 10FT (ELECTRODE) ×3 IMPLANT
SPONGE GAUZE 4X4 12PLY STER LF (GAUZE/BANDAGES/DRESSINGS) ×2 IMPLANT
SPONGE LAP 4X18 X RAY DECT (DISPOSABLE) ×1 IMPLANT
STAPLER VISISTAT 35W (STAPLE) ×3 IMPLANT
SUT ETHILON 3 0 PS 1 (SUTURE) ×6 IMPLANT
SUT MNCRL AB 4-0 PS2 18 (SUTURE) ×1 IMPLANT
SUT SILK 2 0 SH (SUTURE) IMPLANT
SUT SILK 3 0 SH 30 (SUTURE) ×2 IMPLANT
SUT VIC AB 3-0 SH 18 (SUTURE) ×1 IMPLANT
SYR BULB 3OZ (MISCELLANEOUS) ×3 IMPLANT
SYR CONTROL 10ML LL (SYRINGE) ×3 IMPLANT
TOWEL OR 17X24 6PK STRL BLUE (TOWEL DISPOSABLE) ×3 IMPLANT
TOWEL OR 17X26 10 PK STRL BLUE (TOWEL DISPOSABLE) ×3 IMPLANT
TUBE CONNECTING 12'X1/4 (SUCTIONS) ×1
TUBE CONNECTING 12X1/4 (SUCTIONS) ×2 IMPLANT
WATER STERILE IRR 1000ML POUR (IV SOLUTION) IMPLANT
YANKAUER SUCT BULB TIP NO VENT (SUCTIONS) ×3 IMPLANT

## 2013-08-08 NOTE — Discharge Instructions (Signed)
See above

## 2013-08-08 NOTE — Transfer of Care (Signed)
Immediate Anesthesia Transfer of Care Note  Patient: [name]  Procedure(s) Performed: Procedure(s): Evacuation Hematoma Right Chest (Right)  Patient Location: PACU  Anesthesia Type:General  Level of Consciousness: awake, alert , oriented and patient cooperative  Airway & Oxygen Therapy: Patient Spontanous Breathing and Patient connected to nasal cannula oxygen  Post-op Assessment: Report given to PACU RN and Post -op Vital signs reviewed and stable  Post vital signs: Reviewed and stable  Complications: No apparent anesthesia complications

## 2013-08-08 NOTE — Op Note (Signed)
Patient Name:           [name]   Date of Surgery:        08/08/2013  Note: This dictation was prepared with Dragon/digital dictation along with [facility] technology. Any transcriptional errors that result from this process are unintentional.   Pre op Diagnosis:      Hematoma right mastectomy wound  Post op Diagnosis:    Same  Procedure:                 Evacuation hematoma right mastectomy wound, control of bleeders  Surgeon:                     [name]. Dalbert Batman, M.D., FACS  Assistant:                      None  Operative Indications:   This is a 80 year old female who underwent right total mastectomy yesterday for a large palpable breast cancer.She has end-stage renal disease on dialysis, history of coronary disease, pulmonary disease, and cognitive disorders. Her surgery was uneventful. Today her hemoglobin   dropped more than 2 g and her wound, although healthy and nontender was somewhat swollen consistent with hematoma. She had dialysis this morning and was brought back to the operating room this afternoon.  Operative Findings:       There was a clotted fresh hematoma in the wound. Probably 400 or 500 cc in volume. There was no one specific bleeder. She had diffuse oozing, this was very slow, requiring lots of electrocautery. It looked completely dry at the end of the case.  Procedure in Detail:          Following the induction of general endotracheal anesthesia the patient's right chest wall was examined. We scrubbed  all the Dermabond from the wound. We removed the old drain. The right chest wall was prepped and draped in a sterile fashion. Intravenous antibiotics were given. Surgical time out was performed.      Using a knife I opened up the transverse mastectomy incision and entered the wound. I evacuated the hematoma. I washed the wound with saline and with a pulsatile irrigator fairly extensively to get all the clot out. I then used electrocautery extensively to cauterize all  of the bleeders. I went around the wound 4 or 5 times until I had everything completely dry. I then observed the wound for about 5 or 10 minutes. When there appeared to be no bleeding whatsoever I placed two 19 [place] Blake drains in the wound and brought them out through separate stab incisions inferolaterally. These were sutured to the skin and connected to suction bulbs. A transverse mastectomy incision was closed with nylon sutures and skin staples. The suction drains held a charge. The wound was cleansed and dried and covered with Adaptic gauze, 4 x 4's, ABDs, and a breast binder. The patient tolerated the procedure well was taken to PACU in stable condition. EBL 500 cc of old clot, 50 cc or less of fresh bleeding. Counts correct. Complications none.     [name]. Dalbert Batman, M.D., FACS General and Minimally Invasive Surgery Breast and Colorectal Surgery  08/08/2013 7:11 PM

## 2013-08-08 NOTE — Anesthesia Preprocedure Evaluation (Addendum)
Anesthesia Evaluation  Patient identified by MRN, date of birth, ID band Patient awake    Reviewed: Allergy & Precautions, H&P , NPO status , Patient's Chart, lab work & pertinent test results, reviewed documented beta blocker date and time   Airway Mallampati: I      Dental   Pulmonary Current Smoker, former smoker,          Cardiovascular hypertension, + Peripheral Vascular Disease     Neuro/Psych  Headaches,    GI/Hepatic (+) Hepatitis -, C  Endo/Other  diabetes  Renal/GU ESRF and DialysisRenal disease     Musculoskeletal   Abdominal   Peds  Hematology  (+) anemia ,   Anesthesia Other Findings   Reproductive/Obstetrics                          Anesthesia Physical Anesthesia Plan  ASA: III  Anesthesia Plan: General ETT and Modified Rapid Sequence   Post-op Pain Management:    Induction:   Airway Management Planned:   Additional Equipment:   Intra-op Plan:   Post-operative Plan:   Informed Consent:   Plan Discussed with:   Anesthesia Plan Comments:         Anesthesia Quick Evaluation

## 2013-08-08 NOTE — H&P (View-Only) (Signed)
Gen. Surgery:  Lab work shows hemoglobin is 8.6. It was 10.8 yesterday postop and 12.5 of March 21. She is perfectly stable and perfectly comfortable and denies pain. Hemodialysis apparently went well today.  On exam she is alert and pleasant. Right mastectomy wound reveals a palpable hematoma. The JP drain is watery and bloody, but the hematoma is sizable and the drain is obviously not working well.  Plan:      She will need to be returned to the operating room this afternoon for wound exploration, evacuation of hematoma, placement of new drains and wound closure.OR notified. Bedside RN notified.      PT,PTT, type and screen ordered.      Discontinue lovenox      I discussed this with the patient and her niece, who is POA.. I discussed the indications, details, technique, and numerous risk of the surgery with them both. They both agree with this plan.All of their questions are answered. They understand all these issues. They know that this will cancel her discharge and she will need to be hospitalized for another couple of days.      The niece has asked that we discharge her to a skilled nursing facility and rehabilitation rather than let her go home alone. Will involve  PT, OT, and social work postop.   Deztiny Sarra M. Jacquelene Kopecky, M.D., FACS [facility] General and Minimally invasive Surgery Breast and Colorectal Surgery Office:   [phone] Pager:   [phone]  

## 2013-08-08 NOTE — Progress Notes (Signed)
Endorses to Am Nurse to call Dt Dalbert Batman as soon as pt returns from dialysis.

## 2013-08-08 NOTE — Anesthesia Postprocedure Evaluation (Signed)
  Anesthesia Post-op Note  Patient: [name]  Procedure(s) Performed: Procedure(s): Evacuation Hematoma Right Chest (Right)  Patient Location: PACU  Anesthesia Type:General  Level of Consciousness: awake, alert , oriented and patient cooperative  Airway and Oxygen Therapy: Patient Spontanous Breathing  Post-op Pain: 2 /10, mild  Post-op Assessment: Post-op Vital signs reviewed, Patient's Cardiovascular Status Stable, Respiratory Function Stable, Patent Airway, No signs of Nausea or vomiting and Pain level controlled  Post-op Vital Signs: Reviewed and stable  Last Vitals:  Filed Vitals:   08/08/13 2030  BP: 127/96  Pulse: 71  Temp: 36.4 C  Resp: 15    Complications: No apparent anesthesia complications

## 2013-08-08 NOTE — Anesthesia Postprocedure Evaluation (Signed)
Anesthesia Post Note  Patient: [name]  Procedure(s) Performed: Procedure(s) (LRB): RIGHT TOTAL MASTECTOMY (Right)  Anesthesia type: general  Patient location: PACU  Post pain: Pain level controlled  Post assessment: Patient's Cardiovascular Status Stable  Last Vitals:  Filed Vitals:   08/08/13 0600  BP: 106/35  Pulse: 64  Temp: 36.8 C  Resp: 17    Post vital signs: Reviewed and stable  Level of consciousness: sedated  Complications: No apparent anesthesia complications

## 2013-08-08 NOTE — Progress Notes (Signed)
  [facility] Progress Note  Assessment/Plan: 1. S/p right mastectomy for invasive breast cancer- for d/c today per Dr Dalbert Batman 2. ESRD -MWF K 4.2       3. Anemia - Hgb drop post op to 8.6 - redose Aranesp today - no heparin HD - continue no heparin for the remainder of the week and then resume previous dose next week 4. Secondary hyperparathyroidism - current tx 5. HTN/volume - BP much lower than usual today 3 kg above EDW can't tolerate volume removal. UF as tolerated: ? Residual effect of anesthesia - not taking pain meds 6. Nutrition - hasn't eaten because she dislikes the food - change to regular diet for lunch prior to d/c today 7. Thrombocytopenia - follow  Myriam Jacobson, PA-C [facility] Beeper [phone] 08/08/2013,8:02 AM  LOS: 1 day   Pt seen, examined and agree w A/P as above. Back to OR for hematoma exploration.  Kelly Splinter MD pager [phone]    cell [phone] 08/08/2013, 2:25 PM    Subjective:   Denies pain, hates the food; happy to be going home today  Objective Filed Vitals:   08/08/13 0728 08/08/13 0735 08/08/13 0745 08/08/13 0800  BP: 158/48 151/55 108/45 89/47  Pulse: 66 65 71 63  Temp:      TempSrc:      Resp: 18 16 18 16   Height:      Weight:      SpO2:       Physical Exam General: NAD on supine HD, dry mouth Heart: RRR distant Lungs: coarse bs Abdomen:soft NT Extremities: SCDs in place Dialysis Access: left AVGG  Dialysis Orders: 3h 69min F160 51kg 2/2.0 Bath PRof 4 400/A1.5 Heparin 1700 LUA AVG  Aranesp 25 ug every 2 weeks on Wed, last 5/20 Venofer 50 mg / wk   Additional Objective Labs: Basic Metabolic Panel:  Recent Labs Lab 08/07/13 1112 08/07/13 2037 08/08/13 0326  NA 136*  --  136*  K 4.0  --  4.2  CL  --   --  94*  CO2  --   --  28  GLUCOSE 78  --  145*  BUN  --   --  39*  CREATININE  --  5.26* 5.36*  CALCIUM  --   --  8.5  CBC:  Recent Labs Lab 08/07/13 1112 08/07/13 2037 08/08/13 0326   WBC  --  7.5 6.3  HGB 13.9 10.5* 8.6*  HCT 41.0 30.4* 25.2*  MCV  --  93.8 94.7  PLT  --  139* 126*    CBG:  Recent Labs Lab 08/07/13 1507  GLUCAP 73  Medications: . sodium chloride 50 mL/hr at 08/07/13 1825   . amLODipine  5 mg Oral Daily  . cinacalcet  30 mg Oral BID WC  . darbepoetin (ARANESP) injection - DIALYSIS  25 mcg Intravenous Q Wed-HD  . enoxaparin (LOVENOX) injection  30 mg Subcutaneous Q24H  . lanthanum  1,000 mg Oral TID WC  . metoprolol tartrate  25 mg Oral BID  . multivitamin  1 tablet Oral QHS

## 2013-08-08 NOTE — Discharge Summary (Addendum)
Patient ID: DIXIE COPPA [id number] 80 y.o. May 19, 1932  Admit date: 08/07/2013  Discharge date and time: No discharge date for patient encounter.  Admitting Physician: [name]  Discharge Physician: [name]  Admission Diagnoses: right breast cancer  Discharge Diagnoses: Right breast cancer  Operations: Procedure(s): RIGHT TOTAL MASTECTOMY  Admission Condition: good  Discharged Condition: good  Indication for Admission: [name] is a 80 y.o. female. She is referred by Dr. Shelly Bombard at the breast center [facility] for management of a newly diagnosed invasive cancer of the right breast. Dr. Moshe Cipro and Dr. Posey Pronto are her nephrologists. Dr. Nancy Fetter is her PCP. Marland Kitchen  This patient underwent left mastectomy in 1988 by Dr. Hezzie Bump. She says she took pills for 5 years. She has not had a mammogram for 7 years. She has no pain and did not perceive the mass.  When she went for a new bra at [facility], they found a lump in her right breast. Right breast mammogram and right breast ultrasound were performed on 06/29/2013. This shows a highly suspicious 3.1 cm mass in the lower right breast with probable skin retraction and involvement of the subdermal layer. The right axilla looked normal. Ultrasound-guided biopsy of this right breast mass revealed invasive ductal carcinoma, ER +100%, PR -0%, HER-2-negative. Examination reveals a 4-5 cm mass in the right breast central and lateral and There is a little skin fixation anterolaterally but no ulceration. This does not appear to be fixed the pectoralis muscle. The axilla is clinically negative. Left mastectomy wound is well healed without signs of recurrent cancer.  She was discussed in breast cancer conference , and the consensus was that she should probably have a mastectomy and then consideration should be given to an aromatase inhibitor. She is brought to the hospital electively for that surgery. Comorbidities include  end-stage renal disease on dialysis for 11 years, does dialysis Monday Wednesday Friday. She has no history of coronary disease, pulmonary disease, or cognitive disorders.  She is wheelchair-bound following 6 level back surgery. She can stand with a walker but is unsteady and does not sit up well without brace. Needs assistant. She has in Social research officer, government. . Her daughter is with her and lives nearby. She's had abdominal hysterectomy.  Family history is negative for breast or ovarian cancer.  Hospital Course: On the day of admission the patient was taken to the operating room and underwent a right total mastectomy. Drains were placed. Surgery was uneventful. She remained stable overnight. The following morning she was alert and stable. She had no pain and felt good. She was pleased with her progress. Skin flaps were warm and viable. Slight edema but no skin necrosis.Drain was functioning with 90 cc out during the night. She was seen in consultation by Dr. Jonnie Finner of nephrology. They plan inpatient hemodialysis on the day of discharge. The patient felt ready to go home and is going to be discharged home after dialysis. She will return to the office in one week for a wound and drain check. Addendum: Hemoglobin was found to be less than 9. Examination of the patient after dialysis showed hematoma of right mastectomy wound. Discharge was canceled and the patient is to be taken to the operating room for evacuation of right mastectomy wound hematoma.  Consults: nephrology  Significant Diagnostic Studies: Surgical postop pathology pending. Lab work.  Treatments: surgery: Right total mastectomy, hemodialysis planned.  Disposition: Home  Patient Instructions:    Medication List  amLODipine 5 MG tablet  Commonly known as:  NORVASC  Take 5 mg by mouth daily.     b complex-vitamin c-folic acid 0.8 MG Tabs tablet  Take 0.8 mg by mouth at bedtime.     benzonatate 100 MG capsule  Commonly known  as:  TESSALON  Take 100 mg by mouth 3 (three) times daily as needed for cough.     cinacalcet 30 MG tablet  Commonly known as:  SENSIPAR  Take 30 mg by mouth 2 (two) times daily with a meal.     clonazePAM 0.5 MG tablet  Commonly known as:  KLONOPIN  Take 0.5 mg by mouth 2 (two) times daily as needed for anxiety.     ibuprofen 200 MG tablet  Commonly known as:  ADVIL,MOTRIN  Take 200 mg by mouth every 6 (six) hours as needed for fever, headache or mild pain.     lanthanum 1000 MG chewable tablet  Commonly known as:  FOSRENOL  Chew 1,000 mg by mouth 3 (three) times daily with meals.     metoprolol tartrate 25 MG tablet  Commonly known as:  LOPRESSOR  Take 25 mg by mouth 2 (two) times daily.        Activity: activity as tolerated Diet: renal diet Wound Care: as directed  Follow-up:  With Dr. Dalbert Batman in 1 week.  Signed: [name]. Dalbert Batman, M.D., [facility] General and Minimally invasive Surgery Breast and Colorectal Surgery Office:   [phone] Pager:   [phone]  08/08/2013, 5:50 AM

## 2013-08-08 NOTE — Telephone Encounter (Signed)
Per Dr Darrel Hoover verbal order appt made for pt to come to office next week to have wd and drain ck. Per his order if drainage is 15cc or less for 2 days drain can be removed. If drainage is still more that 15cc leave drains,replace drsg and have pt keep appt with him 08-23-13.

## 2013-08-08 NOTE — Progress Notes (Addendum)
Gen. Surgery:  Lab work shows hemoglobin is 8.6. It was 10.8 yesterday postop and 12.5 of March 21. She is perfectly stable and perfectly comfortable and denies pain. Hemodialysis apparently went well today.  On exam she is alert and pleasant. Right mastectomy wound reveals a palpable hematoma. The JP drain is watery and bloody, but the hematoma is sizable and the drain is obviously not working well.  Plan:      She will need to be returned to the operating room this afternoon for wound exploration, evacuation of hematoma, placement of new drains and wound closure.OR notified. Bedside RN notified.      PT,PTT, type and screen ordered.      Discontinue lovenox      I discussed this with the patient and her niece, who is POA.. I discussed the indications, details, technique, and numerous risk of the surgery with them both. They both agree with this plan.All of their questions are answered. They understand all these issues. They know that this will cancel her discharge and she will need to be hospitalized for another couple of days.      The niece has asked that we discharge her to a skilled nursing facility and rehabilitation rather than let her go home alone. Will involve  PT, OT, and social work postop.   [name]. Dalbert Batman, M.D., [facility] General and Minimally invasive Surgery Breast and Colorectal Surgery Office:   [phone] Pager:   [phone]

## 2013-08-08 NOTE — Interval H&P Note (Signed)
History and Physical Interval Note:  08/08/2013 5:46 PM  [name]  has presented today for surgery, with the diagnosis of Post Right Mastectomy Wound  The various methods of treatment have been discussed with the patient and family. After consideration of risks, benefits and other options for treatment, the patient has consented to  Procedure(s): Evac. Hematoma Right Breast (Right) as a surgical intervention .  The patient's history has been reviewed, patient examined, no change in status, stable for surgery.  I have reviewed the patient's chart and labs.  Questions were answered to the patient's satisfaction.     [name]

## 2013-08-09 DIAGNOSIS — N186 End stage renal disease: Secondary | ICD-10-CM | POA: Diagnosis not present

## 2013-08-09 DIAGNOSIS — C50919 Malignant neoplasm of unspecified site of unspecified female breast: Secondary | ICD-10-CM | POA: Diagnosis not present

## 2013-08-09 DIAGNOSIS — D631 Anemia in chronic kidney disease: Secondary | ICD-10-CM | POA: Diagnosis not present

## 2013-08-09 LAB — BASIC METABOLIC PANEL
BUN: 18 mg/dL (ref 6–23)
CALCIUM: 6.9 mg/dL — AB (ref 8.4–10.5)
CHLORIDE: 109 meq/L (ref 96–112)
CO2: 25 mEq/L (ref 19–32)
Creatinine, Ser: 2.98 mg/dL — ABNORMAL HIGH (ref 0.50–1.10)
GFR calc non Af Amer: 14 mL/min — ABNORMAL LOW (ref >90)
GFR, EST AFRICAN AMERICAN: 16 mL/min — AB (ref >90)
Glucose, Bld: 71 mg/dL (ref 70–99)
Potassium: 3.4 mEq/L — ABNORMAL LOW (ref 3.7–5.3)
Sodium: 143 mEq/L (ref 137–147)

## 2013-08-09 LAB — PREPARE PLATELET PHERESIS: Unit division: 0

## 2013-08-09 LAB — CBC
HCT: 18.6 % — ABNORMAL LOW (ref 36.0–46.0)
Hemoglobin: 6.4 g/dL — CL (ref 12.0–15.0)
MCH: 32.8 pg (ref 26.0–34.0)
MCHC: 34.4 g/dL (ref 30.0–36.0)
MCV: 95.4 fL (ref 78.0–100.0)
PLATELETS: 152 10*3/uL (ref 150–400)
RBC: 1.95 MIL/uL — ABNORMAL LOW (ref 3.87–5.11)
RDW: 14.4 % (ref 11.5–15.5)
WBC: 3.6 10*3/uL — AB (ref 4.0–10.5)

## 2013-08-09 LAB — GLUCOSE, CAPILLARY: Glucose-Capillary: 124 mg/dL — ABNORMAL HIGH (ref 70–99)

## 2013-08-09 LAB — PREPARE RBC (CROSSMATCH)

## 2013-08-09 MED ORDER — METOPROLOL TARTRATE 25 MG PO TABS
25.0000 mg | ORAL_TABLET | Freq: Two times a day (BID) | ORAL | Status: DC
Start: 2013-08-09 — End: 2013-08-14
  Administered 2013-08-09 – 2013-08-14 (×7): 25 mg via ORAL
  Filled 2013-08-09 (×10): qty 1

## 2013-08-09 NOTE — Progress Notes (Signed)
OT Cancellation Note  Patient Details Name: [name] MRN: [id number] DOB: December 25, 1932   Cancelled Treatment:    Reason Eval/Treat Not Completed: Medical issues which prohibited therapy (Hbg 6.4)  [place], OTR/L [phone] 08/09/2013, 11:02 AM

## 2013-08-09 NOTE — Progress Notes (Signed)
CRITICAL VALUE ALERT  Critical value received:  Hemoglobin 6.4  Date of notification: 08/09/13   Time of notification:  0749  Critical value read back:yes  Nurse who received alert: Mayra Neer  MD notified (1st page):  Dr. Dalbert Batman  Time of first page:  0752  MD notified (2nd page):  Time of second page:  Responding MD:  Dr Dalbert Batman  Time MD responded:

## 2013-08-09 NOTE — Progress Notes (Signed)
Hemoglobin of 7.2 at 2010 relayed to Dr Hulen Skains and order to transfuse 1 unit of platelet clarified with him. Md said just disregard/cancel platelet order and make sure that pt has complete blood count for the morning of 6/4.

## 2013-08-09 NOTE — Progress Notes (Signed)
Physical medicine and rehabilitation consult requested. Await physical and occupational therapy evaluation to assess disposition and plan with formal rehabilitation consult follow up at that time

## 2013-08-09 NOTE — Telephone Encounter (Signed)
Family member advised of dates of ov and to bring log of drainage a appt.

## 2013-08-09 NOTE — Clinical Social Work Note (Signed)
CSW consulted for possible SNF placement at time of discharge. CSW awaiting disposition determination from PT/OT evaluations. CSW will continue to follow. Thank you for the referral.  [name], MSW, [facility] Licensed Clinical Social Worker [phone] and [phone]

## 2013-08-09 NOTE — Progress Notes (Signed)
  [facility] Progress Note  Assessment: 1. S/p right mastectomy for invasive breast cancer- back to OR yest for hematoma evacuation and control of bleeding 2. ESRD -MWF K 4.2       3. Anemia - getting 2u prbc's today for Hb drop into 6's, tolerating well 4. Secondary hyperparathyroidism - current tx 5. HTN/volume - BP better w transfusion  Plan- HD in am w no heparin, UF 2-3 kg as BP tolerates, f/u Hb tomorrow after prbc's. Stop IVF's  Kelly Splinter MD pager [phone]    cell [phone] 08/09/2013, 4:46 PM    Subjective:   Denies pain, hates the food; happy to be going home today  Objective Filed Vitals:   08/09/13 1245 08/09/13 1400 08/09/13 1518 08/09/13 1600  BP: 152/47 144/48 146/49 155/47  Pulse: 66 66 68 66  Temp: 98.4 F (36.9 C) 98.2 F (36.8 C) 98.3 F (36.8 C) 98.3 F (36.8 C)  TempSrc: Oral Oral Oral Oral  Resp: 16 18 16 16   Height:      Weight:      SpO2: 96% 99% 100% 84%   Physical Exam General: NAD on supine HD, dry mouth Heart: RRR distant Lungs: coarse bs Abdomen:soft NT Extremities: SCDs in place Dialysis Access: left AVGG  Dialysis Orders: 3h 23min F160 51kg 2/2.0 Bath PRof 4 400/A1.5 Heparin 1700 LUA AVG  Aranesp 25 ug every 2 weeks on Wed, last 5/20 Venofer 50 mg / wk   Additional Objective Labs: Basic Metabolic Panel:  Recent Labs Lab 08/07/13 1112 08/07/13 2037 08/08/13 0326 08/09/13 0500  NA 136*  --  136* 143  K 4.0  --  4.2 3.4*  CL  --   --  94* 109  CO2  --   --  28 25  GLUCOSE 78  --  145* 71  BUN  --   --  39* 18  CREATININE  --  5.26* 5.36* 2.98*  CALCIUM  --   --  8.5 6.9*  CBC:  Recent Labs Lab 08/07/13 2037 08/08/13 0326 08/08/13 2010 08/09/13 0500  WBC 7.5 6.3 4.3 3.6*  HGB 10.5* 8.6* 7.2* 6.4*  HCT 30.4* 25.2* 21.1* 18.6*  MCV 93.8 94.7 95.0 95.4  PLT 139* 126* 152 152    CBG:  Recent Labs Lab 08/07/13 1507 08/08/13 1633 08/08/13 1914  GLUCAP 73 123* 112*  Medications: . sodium  chloride 50 mL/hr at 08/08/13 2237  . sodium chloride     . amLODipine  5 mg Oral Daily  . cinacalcet  30 mg Oral BID WC  . lanthanum  1,000 mg Oral TID WC  . metoprolol tartrate  25 mg Oral BID  . multivitamin  1 tablet Oral QHS

## 2013-08-09 NOTE — Progress Notes (Signed)
1 Day Post-Op  Subjective: Patient is stable and alert. Denies pain shortness of breath. Morning laboratory pending  Objective: Vital signs in last 24 hours: Temp:  [97.4 F (36.3 C)-98.2 F (36.8 C)] 98.2 F (36.8 C) (06/04 0529) Pulse Rate:  [59-74] 68 (06/04 0529) Resp:  [13-20] 18 (06/04 0529) BP: (72-158)/(36-111) 113/44 mmHg (06/04 0529) SpO2:  [91 %-100 %] 96 % (06/04 0529) Weight:  [119 lb 7.8 oz (54.2 kg)-120 lb 9.5 oz (54.7 kg)] 119 lb 7.8 oz (54.2 kg) (06/03 1130)    Intake/Output from previous day: 06/03 0701 - 06/04 0700 In: 1591.7 [P.O.:200; I.V.:1067.7; Blood:286] Out: 315 [Drains:105; Blood:60] Intake/Output this shift: Total I/O In: 805.7 [P.O.:200; I.V.:567.7; Other:38] Out: 165 [Drains:105; Blood:60]   EXAM: General appearance: alert. Cooperative. In no distress. Feeble. Resp: clear to auscultation bilaterally Breasts:  right mastectomy skin flaps are warm and viable. There is no hematoma. Both drains working draining reddish watery fluid.  Lab Results:  Results for orders placed during the hospital encounter of 08/07/13 (from the past 24 hour(s))  PROTIME-INR     Status: None   Collection Time    08/08/13  9:48 AM      Result Value Ref Range   Prothrombin Time 14.8  11.6 - 15.2 seconds   INR 1.19  0.00 - 1.49  APTT     Status: None   Collection Time    08/08/13  9:48 AM      Result Value Ref Range   aPTT 35  24 - 37 seconds  TYPE AND SCREEN     Status: None   Collection Time    08/08/13  1:00 PM      Result Value Ref Range   ABO/RH(D) B POS     Antibody Screen NEG     Sample Expiration 08/11/2013    ABO/RH     Status: None   Collection Time    08/08/13  1:00 PM      Result Value Ref Range   ABO/RH(D) B POS    GLUCOSE, CAPILLARY     Status: Abnormal   Collection Time    08/08/13  4:33 PM      Result Value Ref Range   Glucose-Capillary 123 (*) 70 - 99 mg/dL  PREPARE PLATELET PHERESIS     Status: None   Collection Time    08/08/13   7:00 PM      Result Value Ref Range   Unit Number [id number]     Blood Component Type PLTPHER LR1     Unit division 00     Status of Unit ISSUED     Transfusion Status OK TO TRANSFUSE    GLUCOSE, CAPILLARY     Status: Abnormal   Collection Time    08/08/13  7:14 PM      Result Value Ref Range   Glucose-Capillary 112 (*) 70 - 99 mg/dL  CBC     Status: Abnormal   Collection Time    08/08/13  8:10 PM      Result Value Ref Range   WBC 4.3  4.0 - 10.5 K/uL   RBC 2.22 (*) 3.87 - 5.11 MIL/uL   Hemoglobin 7.2 (*) 12.0 - 15.0 g/dL   HCT 21.1 (*) 36.0 - 46.0 %   MCV 95.0  78.0 - 100.0 fL   MCH 32.4  26.0 - 34.0 pg   MCHC 34.1  30.0 - 36.0 g/dL   RDW 14.5  11.5 - 15.5 %   Platelets  152  150 - 400 K/uL     Studies/Results: No results found.  Marland Kitchen amLODipine  5 mg Oral Daily  . cinacalcet  30 mg Oral BID WC  . lanthanum  1,000 mg Oral TID WC  . metoprolol tartrate  25 mg Oral BID  . multivitamin  1 tablet Oral QHS     Assessment/Plan: s/p Procedure(s): Evacuation Hematoma Right Chest   POD #1, evacuation hematoma right mastectomy wound. Stable. Skin flaps appear stuck to chest wall, no retained fluid or hematoma noted mobilize out of bed Check lab work.  Acute blood loss anemia. receivedAranesp yesterday Transfuse for hemoglobin 7.0 or lower  POD #2. Right total mastectomy. Check pathology  End stage renal disease. Nephrology involved. Anticipate inpatient dialysis tomorrow  HTN. On norvasc, lopressor  Disability following multilevel back surgery, gait disorder, history cardiac and pulmonary disease. Niece, who is POA, strongly advocates for SNF rehabilitation. Patient is resistant to this but is willing to consider [facility]. PT, OT,  CIR and social work Consults have been placed.  I will be out of town for the next 10 days. I discussed this with the patient. I will ask my partners to assume her care in my absence.  @PROBHOSP @  LOS: 2 days     Timoty Bourke M. Dalbert Batman, M.D., [facility] General and Minimally invasive Surgery Breast and Colorectal Surgery Office:   [phone] Pager:   [phone]  08/09/2013  . .prob

## 2013-08-09 NOTE — Progress Notes (Addendum)
Physical Therapy Treatment Patient Details Name: [name] MRN: [id number] DOB: 02/17/1933 Today's Date: 08/09/2013    History of Present Illness Admitted with invasive cancer R breast, s/p R total mastectomy    PT Comments    Pt admitted with/for R mastectomy .  Pt currently limited functionally due to the problems listed. ( See problems list.)   Pt will benefit from PT to maximize function and safety in order to get ready for next venue listed below.   Follow Up Recommendations  SNF, but pt may choose to go home with PCA assist.      Equipment Recommendations  None recommended by PT    Recommendations for Other Services       Precautions / Restrictions Precautions Precautions: Fall    Mobility  Bed Mobility Overal bed mobility: Needs Assistance Bed Mobility: Supine to Sit;Sit to Supine;Rolling Rolling: Mod assist   Supine to sit: Max assist Sit to supine: Max assist   General bed mobility comments: cues for technique; truncal assist; conversation to figure out how pt and PCA;s accomplish the task.  Transfers Overall transfer level: Needs assistance Equipment used: Rolling walker (2 wheeled) Transfers: Sit to/from Stand Sit to Stand: Mod assist         General transfer comment: cues for hand placement; assist to come forward  Ambulation/Gait Ambulation/Gait assistance: Mod assist Ambulation Distance (Feet): 3 Feet (forward and back) Assistive device: Rolling walker (2 wheeled) Gait Pattern/deviations: Step-to pattern;Shuffle;Narrow base of support Gait velocity: very slow   General Gait Details: L LE shuffled paretic gait   Stairs            Wheelchair Mobility    Modified Rankin (Stroke Patients Only)       Balance Overall balance assessment: No apparent balance deficits (not formally assessed);Needs assistance Sitting-balance support: Feet supported;No upper extremity supported Sitting balance-Leahy Scale: Fair     Standing  balance support: No upper extremity supported Standing balance-Leahy Scale: Normal                      Cognition Arousal/Alertness: Awake/alert Behavior During Therapy: WFL for tasks assessed/performed Overall Cognitive Status: Within Functional Limits for tasks assessed                      Exercises      General Comments        Pertinent Vitals/Pain     Home Living Family/patient expects to be discharged to:: Private residence Living Arrangements: Alone Available Help at Discharge: Family;Personal care attendant;Other (Comment) (2 PCA's that help before and after HD and 7 days/week) Type of Home: House Home Access: Ramped entrance   Home Layout: One level Home Equipment: [place] - 4 wheels;Wheelchair - Education administrator (comment);Hospital bed;Bedside commode (lift chair)      Prior Function Level of Independence: Needs assistance  Gait / Transfers Assistance Needed: mod ADL's / Homemaking Assistance Needed: dependent, but assists where able.  Bathing supine and at EOB     PT Goals (current goals can now be found in the care plan section) Acute Rehab PT Goals Patient Stated Goal: Get back home and be able to manage as well as before with the girls PT Goal Formulation: With patient Time For Goal Achievement: 08/23/13 Potential to Achieve Goals: Good    Frequency  Min 3X/week    PT Plan      Co-evaluation             End of  Session Equipment Utilized During Treatment: Gait belt Activity Tolerance: Patient tolerated treatment well Patient left: in bed;with call bell/phone within reach     Time: 1430-1513 PT Time Calculation (min): 43 min  Charges:  $Gait Training: 8-22 mins $Therapeutic Activity: 23-37 mins                    G Codes:      [employer] V 08/09/2013, 3:34 PM 08/09/2013  Donnella Sham, PT [phone]  (pager)

## 2013-08-10 ENCOUNTER — Encounter (HOSPITAL_COMMUNITY): Payer: Self-pay | Admitting: General Surgery

## 2013-08-10 DIAGNOSIS — R5381 Other malaise: Secondary | ICD-10-CM | POA: Diagnosis not present

## 2013-08-10 DIAGNOSIS — N186 End stage renal disease: Secondary | ICD-10-CM | POA: Diagnosis not present

## 2013-08-10 DIAGNOSIS — D631 Anemia in chronic kidney disease: Secondary | ICD-10-CM | POA: Diagnosis not present

## 2013-08-10 DIAGNOSIS — C50919 Malignant neoplasm of unspecified site of unspecified female breast: Secondary | ICD-10-CM | POA: Diagnosis not present

## 2013-08-10 LAB — TYPE AND SCREEN
ABO/RH(D): B POS
Antibody Screen: NEGATIVE
Unit division: 0
Unit division: 0

## 2013-08-10 LAB — BASIC METABOLIC PANEL
BUN: 29 mg/dL — ABNORMAL HIGH (ref 6–23)
CO2: 25 mEq/L (ref 19–32)
Calcium: 8.2 mg/dL — ABNORMAL LOW (ref 8.4–10.5)
Chloride: 96 mEq/L (ref 96–112)
Creatinine, Ser: 4.72 mg/dL — ABNORMAL HIGH (ref 0.50–1.10)
GFR, EST AFRICAN AMERICAN: 9 mL/min — AB (ref >90)
GFR, EST NON AFRICAN AMERICAN: 8 mL/min — AB (ref >90)
Glucose, Bld: 80 mg/dL (ref 70–99)
POTASSIUM: 5 meq/L (ref 3.7–5.3)
SODIUM: 133 meq/L — AB (ref 137–147)

## 2013-08-10 LAB — CBC
HCT: 28.8 % — ABNORMAL LOW (ref 36.0–46.0)
Hemoglobin: 9.7 g/dL — ABNORMAL LOW (ref 12.0–15.0)
MCH: 29.8 pg (ref 26.0–34.0)
MCHC: 33.7 g/dL (ref 30.0–36.0)
MCV: 88.3 fL (ref 78.0–100.0)
PLATELETS: 156 10*3/uL (ref 150–400)
RBC: 3.26 MIL/uL — ABNORMAL LOW (ref 3.87–5.11)
RDW: 19.4 % — AB (ref 11.5–15.5)
WBC: 6 10*3/uL (ref 4.0–10.5)

## 2013-08-10 MED ORDER — PENTAFLUOROPROP-TETRAFLUOROETH EX AERO: 1.0000 "application " | INHALATION_SPRAY | CUTANEOUS | Status: DC | PRN

## 2013-08-10 MED ORDER — HEPARIN SODIUM (PORCINE) 1000 UNIT/ML DIALYSIS: 1000.0000 [IU] | INTRAMUSCULAR | Status: DC | PRN

## 2013-08-10 MED ORDER — SODIUM CHLORIDE 0.9 % IV SOLN: 100.0000 mL | INTRAVENOUS | Status: DC | PRN

## 2013-08-10 MED ORDER — LIDOCAINE HCL (PF) 1 % IJ SOLN
5.0000 mL | INTRAMUSCULAR | Status: DC | PRN
  Filled 2013-08-10: qty 5

## 2013-08-10 MED ORDER — LIDOCAINE-PRILOCAINE 2.5-2.5 % EX CREA
1.0000 "application " | TOPICAL_CREAM | CUTANEOUS | Status: DC | PRN
  Filled 2013-08-10: qty 5

## 2013-08-10 MED ORDER — ALTEPLASE 2 MG IJ SOLR
2.0000 mg | Freq: Once | INTRAMUSCULAR | Status: DC | PRN
  Filled 2013-08-10: qty 2

## 2013-08-10 MED ORDER — NEPRO/CARBSTEADY PO LIQD
237.0000 mL | ORAL | Status: DC | PRN
Start: 2013-08-10 — End: 2013-08-10
  Filled 2013-08-10: qty 237
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
## 2013-08-10 NOTE — Progress Notes (Signed)
Rehab admissions - Evaluated for possible admission.  Please see rehab consult done today by Dr. Naaman Plummer.  Recommendations are for SNF or HH therapies.  Not deemed to be a candidate for acute inpatient rehab services.  Call me for questions.  #[phone]

## 2013-08-10 NOTE — Clinical Social Work Psychosocial (Signed)
Clinical Social Work Department BRIEF PSYCHOSOCIAL ASSESSMENT 08/10/2013  Patient:  [name], [name]     Account Number:  [id number]     Admit date:  08/07/2013  Clinical Social Worker:  Delrae Sawyers  Date/Time:  08/10/2013 03:39 PM  Referred by:  Physician  Date Referred:  08/10/2013 Referred for  SNF Placement   Other Referral:   Pt denied from CIR.   Interview type:  Patient Other interview type:   Pt's niece [name]] also present at bedside.    PSYCHOSOCIAL DATA Living Status:  ALONE Admitted from facility:   Level of care:   Primary support name:  [name] Primary support relationship to patient:  FAMILY Degree of support available:   Pt's primary support and POA is her niece, [name] [[phone]].    CURRENT CONCERNS Current Concerns  Post-Acute Placement   Other Concerns:   none.    SOCIAL WORK ASSESSMENT / PLAN CSW received consult for possible SNF placement at time of discharge. CSW met with pt and pt's niece at bedside to discuss discharge disposition. Pt's preferred preference is for CIR, but understood from speaking with CIR adimssions liaison that pt was not eligible for CIR. Pt discussed her negative history with SNF placement, but stated she would be agreeable to CSW completing SNF search in [facility].    Per pt, pt lives alone but has help throughout the day. Pt stated her niece cares for her along with "two women who clean me, my clothes, and cook for me." Per pt's niece, the "two women" are family/friend CNA's that the family pays to stay with pt at home.    Pt understands weekend CSW to follow-up and present bedoffers for SNF placement.   Assessment/plan status:  Psychosocial Support/Ongoing Assessment of Needs Other assessment/ plan:   none.   Information/referral to community resources:   [facility] bed offers.    PATIENT'S/FAMILY'S RESPONSE TO PLAN OF CARE: Pt and pt's niece understanding and agreeable to CSW  plan of care. Pt expressed concern for returning to SNF, but is willing to explore SNF options prior to denying SNF.       [name], MSW, [facility] Licensed Clinical Social Worker [phone] and [phone]

## 2013-08-10 NOTE — Clinical Social Work Placement (Signed)
Clinical Social Work Department CLINICAL SOCIAL WORK PLACEMENT NOTE 08/10/2013  Patient:  [name], [name]  Account Number:  [id number] Admit date:  08/07/2013  Clinical Social Worker:  Delrae Sawyers  Date/time:  08/10/2013 03:47 PM  Clinical Social Work is seeking post-discharge placement for this patient at the following level of care:   [place]   (*CSW will update this form in Epic as items are completed)   08/10/2013  Patient/family provided with [place] Department of Clinical Social Work's list of facilities offering this level of care within the geographic area requested by the patient (or if unable, by the patient's family).  08/10/2013  Patient/family informed of their freedom to choose among providers that offer the needed level of care, that participate in Medicare, Medicaid or managed care program needed by the patient, have an available bed and are willing to accept the patient.  08/10/2013  Patient/family informed of [facility]' ownership interest in [facility], as well as of the fact that they are under no obligation to receive care at this facility.  PASARR submitted to EDS on  PASARR number received on   FL2 transmitted to all facilities in geographic area requested by pt/family on  08/10/2013 FL2 transmitted to all facilities within larger geographic area on   Patient informed that his/her managed care company has contracts with or will negotiate with  certain facilities, including the following:     Patient/family informed of bed offers received:   Patient chooses bed at  Physician recommends and patient chooses bed at    Patient to be transferred to  on   Patient to be transferred to facility by  Patient and family notified of transfer on  Name of family member notified:    The following physician request were entered in Epic:   Additional Comments: PASARR already existing.  [name], MSW, [facility] Licensed Clinical Social  Worker [phone] and [phone]

## 2013-08-10 NOTE — Progress Notes (Signed)
  [facility] Progress Note  Assessment: 1. S/p right mastectomy for invasive breast cancer- went back to OR 6/3 for hematoma evacuation and control of bleeding. Hb stable today.  2. ESRD -MWF K 4.2       3. Anemia of ABL and CKD- Hb stable now, s/p transfusion 4. Secondary hyperparathyroidism - current tx 5. HTN/volume - BP stable, 1 kg over dry wt, on norvasc and metoprolol 6. Dispo- being assessed for CIR vs SNF vs home  Plan- HD today  Kelly Splinter MD pager [phone]    cell [phone] 08/10/2013, 2:10 PM    Subjective:   No new complaints  Objective Filed Vitals:   08/10/13 1100 08/10/13 1130 08/10/13 1150 08/10/13 1307  BP: 102/42 112/48 110/40 132/44  Pulse: 63 65 67 69  Temp:   98.6 F (37 C) 98.1 F (36.7 C)  TempSrc:   Oral Oral  Resp:   16 17  Height:      Weight:   52.5 kg (115 lb 11.9 oz)   SpO2:   100% 100%   Physical Exam General: NAD, dry mouth Heart: RRR distant Lungs: coarse bs Abdomen:soft NT Extremities: SCDs in place no edema Dialysis Access: left arb AVGG  [facility]  3h 22min F160 51kg 2/2.0 Bath PRof 4 400/A1.5 Heparin 1700 LUA AVG  Aranesp 25 ug every 2 weeks on Wed, last 5/20 Venofer 50 mg / wk  Additional Objective Labs: Basic Metabolic Panel:  Recent Labs Lab 08/08/13 0326 08/09/13 0500 08/10/13 0506  NA 136* 143 133*  K 4.2 3.4* 5.0  CL 94* 109 96  CO2 28 25 25   GLUCOSE 145* 71 80  BUN 39* 18 29*  CREATININE 5.36* 2.98* 4.72*  CALCIUM 8.5 6.9* 8.2*  CBC:  Recent Labs Lab 08/07/13 2037 08/08/13 0326 08/08/13 2010 08/09/13 0500 08/10/13 0506  WBC 7.5 6.3 4.3 3.6* 6.0  HGB 10.5* 8.6* 7.2* 6.4* 9.7*  HCT 30.4* 25.2* 21.1* 18.6* 28.8*  MCV 93.8 94.7 95.0 95.4 88.3  PLT 139* 126* 152 152 156    CBG:  Recent Labs Lab 08/07/13 1507 08/08/13 1633 08/08/13 1914 08/09/13 1654  GLUCAP 73 123* 112* 124*  Medications:   . amLODipine  5 mg Oral Daily  . cinacalcet  30 mg Oral BID WC  . lanthanum   1,000 mg Oral TID WC  . metoprolol tartrate  25 mg Oral BID  . multivitamin  1 tablet Oral QHS

## 2013-08-10 NOTE — Progress Notes (Signed)
Patient ID: [name], female   DOB: [birth year], 80 y.o.   MRN: [id number] 2 Days Post-Op  Subjective: Pt denies pain.  Stable overnight.    Objective: Vital signs in last 24 hours: Temp:  [98 F (36.7 C)-98.7 F (37.1 C)] 98.6 F (37 C) (06/05 0800) Pulse Rate:  [63-74] 63 (06/05 1000) Resp:  [16-18] 16 (06/05 0800) BP: (128-171)/(36-66) 129/53 mmHg (06/05 1000) SpO2:  [80 %-100 %] 100 % (06/05 0800) Weight:  [125 lb (56.7 kg)] 125 lb (56.7 kg) (06/05 0800)    Intake/Output from previous day: 06/04 0701 - 06/05 0700 In: 1270 [P.O.:580; I.V.:20; Blood:670] Out: 325 [Drains:325] Intake/Output this shift:     EXAM: General appearance: alert. Cooperative. In no distress.  Resp: clear to auscultation bilaterally Breasts:  right mastectomy skin flaps are warm.  No hematoma  Lab Results:  Results for orders placed during the hospital encounter of 08/07/13 (from the past 24 hour(s))  GLUCOSE, CAPILLARY     Status: Abnormal   Collection Time    08/09/13  4:54 PM      Result Value Ref Range   Glucose-Capillary 124 (*) 70 - 99 mg/dL  CBC     Status: Abnormal   Collection Time    08/10/13  5:06 AM      Result Value Ref Range   WBC 6.0  4.0 - 10.5 K/uL   RBC 3.26 (*) 3.87 - 5.11 MIL/uL   Hemoglobin 9.7 (*) 12.0 - 15.0 g/dL   HCT 28.8 (*) 36.0 - 46.0 %   MCV 88.3  78.0 - 100.0 fL   MCH 29.8  26.0 - 34.0 pg   MCHC 33.7  30.0 - 36.0 g/dL   RDW 19.4 (*) 11.5 - 15.5 %   Platelets 156  150 - 400 K/uL  BASIC METABOLIC PANEL     Status: Abnormal   Collection Time    08/10/13  5:06 AM      Result Value Ref Range   Sodium 133 (*) 137 - 147 mEq/L   Potassium 5.0  3.7 - 5.3 mEq/L   Chloride 96  96 - 112 mEq/L   CO2 25  19 - 32 mEq/L   Glucose, Bld 80  70 - 99 mg/dL   BUN 29 (*) 6 - 23 mg/dL   Creatinine, Ser 4.72 (*) 0.50 - 1.10 mg/dL   Calcium 8.2 (*) 8.4 - 10.5 mg/dL   GFR calc non Af Amer 8 (*) >90 mL/min   GFR calc Af Amer 9 (*) >90 mL/min     Studies/Results: No  results found.  Marland Kitchen amLODipine  5 mg Oral Daily  . cinacalcet  30 mg Oral BID WC  . lanthanum  1,000 mg Oral TID WC  . metoprolol tartrate  25 mg Oral BID  . multivitamin  1 tablet Oral QHS     Assessment/Plan: s/p Procedure(s): Evacuation Hematoma Right Chest   POD #2, evacuation hematoma right mastectomy wound. Stable. Skin flaps appear stuck to chest wall, no retained fluid or hematoma noted mobilize out of bed Check lab work.  Acute blood loss anemia. receivedAranesp yesterday Transfuse for hemoglobin 7.0 or lower  POD #3. Right total mastectomy. Check pathology  End stage renal disease. Nephrology involved. Anticipate inpatient dialysis tomorrow  HTN. On norvasc, lopressor  Disability following multilevel back surgery, gait disorder, history cardiac and pulmonary disease. Niece, who is POA, strongly advocates for SNF rehabilitation. Patient is resistant to this but is willing to consider [facility] inpatient rehabilitation.  OT pending.  PT to eval today again.   Pt hopeful to be rehab candidate.      LOS: 3 days    Haywood M. Dalbert Batman, M.D., [facility] General and Minimally invasive Surgery Breast and Colorectal Surgery Office:   [phone] Pager:   [phone]  08/10/2013  . .prob

## 2013-08-10 NOTE — Procedures (Signed)
I was present at this dialysis session, have reviewed the session itself and made  appropriate changes  Kelly Splinter MD (pgr) [phone]    ([phone] 08/10/2013, 1:08 PM

## 2013-08-10 NOTE — Evaluation (Signed)
Occupational Therapy Evaluation Patient Details Name: [name] MRN: [id number] DOB: 1932-05-25 Today's Date: 08/10/2013    History of Present Illness Admitted with invasive cancer R breast, 6/2 2015 s/p R total mastectomy. 08/08/2013 Evacuation hematoma right mastectomy wound, control of bleeders   Clinical Impression   This 80 yo female admitted and underwent both procedures above presents to acute OT with decreased use of RUE due to precautions, pre-morbid decreased bend in Bil LEs (R worse than L), decreased use of LUE pta as well --long standing, and needing A with BADLs even pta. PT will benefit from acute OT with follow up at SNF to get to a level that she is with decreased burden on caregiver and can do more for herself when no one is with her.    Follow Up Recommendations  SNF    Equipment Recommendations   (TBD next venue)       Precautions / Restrictions Precautions Precautions: Fall Precaution Comments: decreased bend in Bil knees Restrictions Other Position/Activity Restrictions: No pushing/pulling/lifting with RUE      Mobility Bed Mobility Overal bed mobility: Needs Assistance Bed Mobility: Supine to Sit     Supine to sit: Max assist        Transfers Overall transfer level: Needs assistance Equipment used: Rolling walker (2 wheeled) Transfers: Sit to/from [facility] Sit to Stand: Mod assist Stand pivot transfers: Min assist (Needs A to advance LLE)            Balance Overall balance assessment: Needs assistance Sitting-balance support: Feet supported;Single extremity supported Sitting balance-Leahy Scale: Fair     Standing balance support: Bilateral upper extremity supported Standing balance-Leahy Scale: Poor                              ADL                                         General ADL Comments: Mostly dependent pta per pt, but tried to A in any way that she could. She could usually  feed herself with RUE               Pertinent Vitals/Pain No c/o pain     Hand Dominance Right   Extremity/Trunk Assessment Upper Extremity Assessment Upper Extremity Assessment: RUE deficits/detail;LUE deficits/detail RUE Deficits / Details: Due to recent mastectomy--no Pushing, pulling, lifting RUE Coordination: decreased gross motor LUE Deficits / Details: Old left masectomy, and AVG is in this arm; increased edema in hand today (pt states this comes and goes) LUE Coordination: decreased fine motor;decreased gross motor           Communication Communication Communication: No difficulties   Cognition Arousal/Alertness: Awake/alert Behavior During Therapy: WFL for tasks assessed/performed Overall Cognitive Status: Within Functional Limits for tasks assessed                                Home Living Family/patient expects to be discharged to:: Skilled nursing facility Living Arrangements: Alone Available Help at Discharge: Family;Personal care attendant;Other (Comment) (2 PCAs that A before and after HD as well the other 4 days of the week--just 1-2 hours in AM and PM) Type of Home: House Home Access: Ramped entrance     Home Layout: One level  Home Equipment: [place] - 4 wheels;Wheelchair - Education administrator (comment);Hospital bed;Bedside commode          Prior Functioning/Environment Level of Independence: Needs assistance  Gait / Transfers Assistance Needed: mod ADL's / Homemaking Assistance Needed: dependent, but assists where able.  Bathing supine and at EOB        OT Diagnosis: Generalized weakness   OT Problem List: Decreased strength;Decreased range of motion;Decreased activity tolerance;Impaired balance (sitting and/or standing);Impaired UE functional use;Decreased knowledge of use of DME or AE   OT Treatment/Interventions: Self-care/ADL training;DME and/or AE instruction;Therapeutic activities;Patient/family  education;Balance training    OT Goals(Current goals can be found in the care plan section) Acute Rehab OT Goals Patient Stated Goal: rehab then home OT Goal Formulation: With patient Time For Goal Achievement: 08/24/13 Potential to Achieve Goals: Good  OT Frequency: Min 2X/week   Barriers to D/C: Decreased caregiver support             End of Session Equipment Utilized During Treatment: Gait belt;Rolling walker Nurse Communication:  (NT: 2 people with recliner turned towards [facility] and A to advance LLE)  Activity Tolerance: Patient limited by fatigue Patient left: in chair;with call bell/phone within reach   Time: 5809-9833 OT Time Calculation (min): 25 min Charges:  OT General Charges $OT Visit: 1 Procedure OT Evaluation $Initial OT Evaluation Tier I: 1 Procedure OT Treatments $Self Care/Home Management : 8-22 mins  Almon Register [phone] 08/10/2013, 6:47 PM

## 2013-08-11 DIAGNOSIS — C50919 Malignant neoplasm of unspecified site of unspecified female breast: Secondary | ICD-10-CM | POA: Diagnosis not present

## 2013-08-11 DIAGNOSIS — D631 Anemia in chronic kidney disease: Secondary | ICD-10-CM | POA: Diagnosis not present

## 2013-08-11 DIAGNOSIS — N186 End stage renal disease: Secondary | ICD-10-CM | POA: Diagnosis not present

## 2013-08-11 NOTE — Progress Notes (Signed)
MD advised to hold one dose norvasc due to diastolic pressure being 47.  Will continue to monitor. [name]

## 2013-08-11 NOTE — Progress Notes (Signed)
Subjective:  Sitting in chair, feels well, denies pain  Objective: Vital signs in last 24 hours: Temp:  [98.3 F (36.8 C)-98.5 F (36.9 C)] 98.3 F (36.8 C) (06/06 0737) Pulse Rate:  [66-76] 68 (06/06 1046) Resp:  [15-17] 15 (06/06 0458) BP: (137-170)/(47-53) 164/48 mmHg (06/06 1046) SpO2:  [95 %-100 %] 100 % (06/06 0737) Weight change:   Intake/Output from previous day: 06/05 0701 - 06/06 0700 In: 560 [P.O.:560] Out: 2731 [Drains:145] Intake/Output this shift: Total I/O In: 120 [P.O.:120] Out: -   Lab Results:  Recent Labs  08/09/13 0500 08/10/13 0506  WBC 3.6* 6.0  HGB 6.4* 9.7*  HCT 18.6* 28.8*  PLT 152 156   BMET:  Recent Labs  08/09/13 0500 08/10/13 0506  NA 143 133*  K 3.4* 5.0  CL 109 96  CO2 25 25  GLUCOSE 71 80  BUN 18 29*  CREATININE 2.98* 4.72*  CALCIUM 6.9* 8.2*   No results found for this basename: PTH,  in the last 72 hours Iron Studies: No results found for this basename: IRON, TIBC, TRANSFERRIN, FERRITIN,  in the last 72 hours  Studies/Results: No results found.  EXAM: General appearance:  Alert, in no apparent distress Resp:  CTA without rales, rhonchi, or wheezes Cardio:  RRR without murmur or rub GI: + BS, soft and nontender Extremities: No edema Access:  AVG @ LUA with + bruit  [facility]  3h 20min F160 51kg 2/2.0 Bath PRof 4 400/A1.5 Heparin 1700 LUA AVG  Aranesp 25 ug every 2 weeks on Wed, last 5/20 Venofer 50 mg / wk  Assessment/Plan: 1. Invasive R breast cancer - s/p R mastectomy 6/2 by Dr. Excell Seltzer, evacuation of hematoma 6/3; stable. 2. ESRD - HD on MWF @ [place], K 5.  Next HD 6/8. 3. HTN/Volume - BP 164/48 on Amlodipine 5 mg qd, Metoprolol 25 mg bid; wt 52.5 kg s/p net UF 2.6 L yesterday. 4. Anemia - Hgb up to 9.7, Aranesp 25 mcg on Wed q2wks, Fe qwk. 5. Sec HPT - Sensipar 30 mg qd, Fosrenol 1 g with meals.  6. Nutrition - renal diet & vitamin. 7. Dispo - likely DC to SNF for rehab.   LOS: 4 days   Ramiro Harvest 08/11/2013,1:11 PM  Pt seen, examined and agree w A/P as above.  Kelly Splinter MD pager [phone]    cell [phone] 08/11/2013, 1:45 PM

## 2013-08-11 NOTE — Progress Notes (Signed)
MD on call advised to wait until rounding MD comes back for any orders. Will continue to monitor. [name]

## 2013-08-11 NOTE — Progress Notes (Addendum)
Clinical Social Worker (CSW) contacted patient's niece [name] at [phone] and gave bed offers. [name] reported that she was not interested in the bed offers given and that she heard bad things about those facilities. [name] reported that she is interested in [place] and [facility]. Those two facilities have not offered a bed at this point. CSW explained to [name] that when patient is D/c'ed she will have to choose between the facilities that have offered a bed or going home. [name] verbalized her understanding. Weekday CSW will follow up.   Blima Rich, LCSWA Weekend CSW [phone]  [name] called CSW back and reported that she also prefers [facility] and [facility].  Blima Rich, [place] Weekend CSW [phone]

## 2013-08-11 NOTE — Progress Notes (Signed)
Pt. BP 170/47.  Pt. Asymptomatic.  MD on call paged. [name]

## 2013-08-11 NOTE — Progress Notes (Signed)
Patient ID: [name], female   DOB: May 31, 1932, 80 y.o.   MRN: [id number] 3 Days Post-Op  Subjective: Patient getting up with physical therapy. In good spirits. Denies any complaints.  Objective: Vital signs in last 24 hours: Temp:  [98.1 F (36.7 C)-98.6 F (37 C)] 98.3 F (36.8 C) (06/06 0737) Pulse Rate:  [63-76] 72 (06/06 0737) Resp:  [15-17] 15 (06/06 0458) BP: (102-170)/(40-57) 170/47 mmHg (06/06 0737) SpO2:  [95 %-100 %] 100 % (06/06 0737) Weight:  [115 lb 11.9 oz (52.5 kg)] 115 lb 11.9 oz (52.5 kg) (06/05 1150) Last BM Date:  (PTA)  Intake/Output from previous day: 06/05 0701 - 06/06 0700 In: 560 [P.O.:560] Out: 2731 [Drains:145] Intake/Output this shift: Total I/O In: 120 [P.O.:120] Out: -   General appearance: alert, cooperative, no distress and frail Incision/Wound: incision right chest wall clean and dry without swelling or evidence of bleeding  Lab Results:   Recent Labs  08/09/13 0500 08/10/13 0506  WBC 3.6* 6.0  HGB 6.4* 9.7*  HCT 18.6* 28.8*  PLT 152 156   BMET  Recent Labs  08/09/13 0500 08/10/13 0506  NA 143 133*  K 3.4* 5.0  CL 109 96  CO2 25 25  GLUCOSE 71 80  BUN 18 29*  CREATININE 2.98* 4.72*  CALCIUM 6.9* 8.2*     Studies/Results: No results found.  Anti-infectives: Anti-infectives   Start     Dose/Rate Route Frequency Ordered Stop   08/09/13 0600  ceFAZolin (ANCEF) IVPB 2 g/50 mL premix     2 g 100 mL/hr over 30 Minutes Intravenous  Once 08/08/13 2101 08/09/13 0605   08/08/13 1600  ceFAZolin (ANCEF) IVPB 2 g/50 mL premix     2 g 100 mL/hr over 30 Minutes Intravenous On call to O.R. 08/08/13 1503 08/08/13 1808   08/07/13 0600  ceFAZolin (ANCEF) IVPB 2 g/50 mL premix     2 g 100 mL/hr over 30 Minutes Intravenous On call to O.R. 08/06/13 1432 08/07/13 1315      Assessment/Plan: s/p Procedure(s): Right mastectomy Evacuation Hematoma Right Chest Stable postop without wound problems or evidence of further  bleeding End stage renal disease-renal following for hemodialysis needs SNF planned at discharge    LOS: 4 days    Edward Jolly 08/11/2013

## 2013-08-12 DIAGNOSIS — D631 Anemia in chronic kidney disease: Secondary | ICD-10-CM | POA: Diagnosis not present

## 2013-08-12 DIAGNOSIS — C50919 Malignant neoplasm of unspecified site of unspecified female breast: Secondary | ICD-10-CM | POA: Diagnosis not present

## 2013-08-12 DIAGNOSIS — N186 End stage renal disease: Secondary | ICD-10-CM | POA: Diagnosis not present

## 2013-08-12 MED ORDER — SODIUM CHLORIDE 0.9 % IV SOLN: 62.5000 mg | INTRAVENOUS | Status: DC

## 2013-08-12 NOTE — Progress Notes (Signed)
Subjective:  Comfortable in bed, no complaints, denies pain  Objective: Vital signs in last 24 hours: Temp:  [98 F (36.7 C)-98.6 F (37 C)] 98.4 F (36.9 C) (06/07 0725) Pulse Rate:  [64-74] 74 (06/07 0725) Resp:  [16-17] 16 (06/07 0725) BP: (116-171)/(40-65) 159/65 mmHg (06/07 0725) SpO2:  [99 %-100 %] 100 % (06/07 0725) Weight change:   Intake/Output from previous day: 06/06 0701 - 06/07 0700 In: 840 [P.O.:840] Out: 120 [Drains:120]   Lab Results:  Recent Labs  08/10/13 0506  WBC 6.0  HGB 9.7*  HCT 28.8*  PLT 156   BMET:  Recent Labs  08/10/13 0506  NA 133*  K 5.0  CL 96  CO2 25  GLUCOSE 80  BUN 29*  CREATININE 4.72*  CALCIUM 8.2*   No results found for this basename: PTH,  in the last 72 hours Iron Studies: No results found for this basename: IRON, TIBC, TRANSFERRIN, FERRITIN,  in the last 72 hours  Studies/Results: No results found.  EXAM:  General appearance: Alert, in no apparent distress  Resp: CTA without rales, rhonchi, or wheezes  Cardio: RRR without murmur or rub  GI: + BS, soft and nontender  Extremities: No edema  Access: AVG @ LUA with + bruit   [facility]  3h 30min F160 51kg 2/2.0 Bath PRof 4 400/A1.5 Heparin 1700 LUA AVG  Aranesp 25 ug every 2 weeks on Wed, last 5/20 Venofer 50 mg / wk  Assessment/Plan: 1. Invasive R breast cancer - s/p R mastectomy 6/2 by Dr. Excell Seltzer, evacuation of hematoma 6/3; stable. 2. ESRD - HD on MWF @ [place], K 5. Next HD tomorrow. 3. HTN/Volume - BP 159/65 on Amlodipine 5 mg qd, Metoprolol 25 mg bid; wt 52.5 kg s/p net UF 2.6 L on 6/5. 4. Anemia - Hgb up to 9.7, Aranesp 25 mcg on Wed q2wks, Fe qwk. 5. Sec HPT - Sensipar 30 mg qd, Fosrenol 1 g with meals.  6. Nutrition - renal diet & vitamin. 7. Dispo - for SNF placement, SW and family looking at bed offers.     LOS: 5 days   Ramiro Harvest 08/12/2013,8:50 AM  Pt seen, examined and agree w A/P as above.  Kelly Splinter MD pager [phone]    cell  [phone] 08/12/2013, 1:50 PM

## 2013-08-12 NOTE — Progress Notes (Signed)
Patient ID: [name], female   DOB: 17-Jan-1933, 80 y.o.   MRN: [id number] Patient ID: [name], female   DOB: May 11, 1932, 80 y.o.   MRN: [id number] 4 Days Post-Op  Subjective: Patient up in chair.. In good spirits. Denies any complaints.  Objective: Vital signs in last 24 hours: Temp:  [98 F (36.7 C)-98.6 F (37 C)] 98.4 F (36.9 C) (06/07 0725) Pulse Rate:  [64-74] 74 (06/07 0725) Resp:  [16-17] 16 (06/07 0725) BP: (116-171)/(40-65) 169/61 mmHg (06/07 0933) SpO2:  [99 %-100 %] 100 % (06/07 0725) Last BM Date:  (PTA)  Intake/Output from previous day: 06/06 0701 - 06/07 0700 In: 840 [P.O.:840] Out: 120 [Drains:120] Intake/Output this shift: Total I/O In: 120 [P.O.:120] Out: -   General appearance: alert, cooperative, no distress and frail Incision/Wound: incision right chest wall clean and dry without swelling or evidence of bleeding  Lab Results:   Recent Labs  08/10/13 0506  WBC 6.0  HGB 9.7*  HCT 28.8*  PLT 156   BMET  Recent Labs  08/10/13 0506  NA 133*  K 5.0  CL 96  CO2 25  GLUCOSE 80  BUN 29*  CREATININE 4.72*  CALCIUM 8.2*     Studies/Results: No results found.  Anti-infectives: Anti-infectives   Start     Dose/Rate Route Frequency Ordered Stop   08/09/13 0600  ceFAZolin (ANCEF) IVPB 2 g/50 mL premix     2 g 100 mL/hr over 30 Minutes Intravenous  Once 08/08/13 2101 08/09/13 0605   08/08/13 1600  ceFAZolin (ANCEF) IVPB 2 g/50 mL premix     2 g 100 mL/hr over 30 Minutes Intravenous On call to O.R. 08/08/13 1503 08/08/13 1808   08/07/13 0600  ceFAZolin (ANCEF) IVPB 2 g/50 mL premix     2 g 100 mL/hr over 30 Minutes Intravenous On call to O.R. 08/06/13 1432 08/07/13 1315      Assessment/Plan: s/p Procedure(s): Right mastectomy Evacuation Hematoma Right Chest Stable postop without wound problems or evidence of further bleeding End stage renal disease-renal following for hemodialysis needs SNF planned at discharge    LOS: 5  days    Edward Jolly 08/12/2013

## 2013-08-13 DIAGNOSIS — C50919 Malignant neoplasm of unspecified site of unspecified female breast: Secondary | ICD-10-CM | POA: Diagnosis not present

## 2013-08-13 DIAGNOSIS — N186 End stage renal disease: Secondary | ICD-10-CM | POA: Diagnosis not present

## 2013-08-13 DIAGNOSIS — D631 Anemia in chronic kidney disease: Secondary | ICD-10-CM | POA: Diagnosis not present

## 2013-08-13 LAB — CBC
HEMATOCRIT: 26.5 % — AB (ref 36.0–46.0)
HEMOGLOBIN: 9 g/dL — AB (ref 12.0–15.0)
MCH: 29.6 pg (ref 26.0–34.0)
MCHC: 34 g/dL (ref 30.0–36.0)
MCV: 87.2 fL (ref 78.0–100.0)
Platelets: 202 10*3/uL (ref 150–400)
RBC: 3.04 MIL/uL — AB (ref 3.87–5.11)
RDW: 17 % — ABNORMAL HIGH (ref 11.5–15.5)
WBC: 4.3 10*3/uL (ref 4.0–10.5)

## 2013-08-13 LAB — RENAL FUNCTION PANEL
Albumin: 2.3 g/dL — ABNORMAL LOW (ref 3.5–5.2)
BUN: 41 mg/dL — ABNORMAL HIGH (ref 6–23)
CALCIUM: 7.4 mg/dL — AB (ref 8.4–10.5)
CO2: 27 mEq/L (ref 19–32)
Chloride: 93 mEq/L — ABNORMAL LOW (ref 96–112)
Creatinine, Ser: 6.23 mg/dL — ABNORMAL HIGH (ref 0.50–1.10)
GFR calc non Af Amer: 6 mL/min — ABNORMAL LOW (ref >90)
GFR, EST AFRICAN AMERICAN: 7 mL/min — AB (ref >90)
GLUCOSE: 77 mg/dL (ref 70–99)
PHOSPHORUS: 2.4 mg/dL (ref 2.3–4.6)
Potassium: 4.7 mEq/L (ref 3.7–5.3)
SODIUM: 132 meq/L — AB (ref 137–147)

## 2013-08-13 MED ORDER — LIDOCAINE HCL (PF) 1 % IJ SOLN
5.0000 mL | INTRAMUSCULAR | Status: DC | PRN
  Filled 2013-08-13: qty 5

## 2013-08-13 MED ORDER — SODIUM CHLORIDE 0.9 % IV SOLN: 100.0000 mL | INTRAVENOUS | Status: DC | PRN

## 2013-08-13 MED ORDER — SORBITOL 70 % SOLN
30.0000 mL | Freq: Every day | Status: DC | PRN
  Filled 2013-08-13: qty 30

## 2013-08-13 MED ORDER — NEPRO/CARBSTEADY PO LIQD
237.0000 mL | ORAL | Status: DC | PRN
  Filled 2013-08-13: qty 237

## 2013-08-13 MED ORDER — PENTAFLUOROPROP-TETRAFLUOROETH EX AERO: 1.0000 "application " | INHALATION_SPRAY | CUTANEOUS | Status: DC | PRN

## 2013-08-13 MED ORDER — HEPARIN SODIUM (PORCINE) 1000 UNIT/ML DIALYSIS: 1000.0000 [IU] | INTRAMUSCULAR | Status: DC | PRN

## 2013-08-13 MED ORDER — LIDOCAINE-PRILOCAINE 2.5-2.5 % EX CREA: 1.0000 "application " | TOPICAL_CREAM | CUTANEOUS | Status: DC | PRN

## 2013-08-13 MED ORDER — ALTEPLASE 2 MG IJ SOLR: 2.0000 mg | Freq: Once | INTRAMUSCULAR | Status: AC | PRN

## 2013-08-13 NOTE — Progress Notes (Signed)
Physical Therapy Treatment Patient Details Name: [name] MRN: [id number] DOB: May 30, 1932 Today's Date: 08/13/2013    History of Present Illness Admitted with invasive cancer R breast, 6/2 2015 s/p R total mastectomy. 08/08/2013 Evacuation hematoma right mastectomy wound, control of bleeders    PT Comments    Pt mobilizing at min A level which is where she was prior to surgery. Pt cognitively in tact and very good at thinking through mobility tasks before performing them as well as directing her care.  Recommend home with HHPT as pt desires. PT will continue to follow.    Follow Up Recommendations  Home health PT;Supervision for mobility/OOB     Equipment Recommendations  None recommended by PT    Recommendations for Other Services       Precautions / Restrictions Precautions Precautions: Fall Precaution Comments: decreased bend in Bil knees, L>R Restrictions Weight Bearing Restrictions: No Other Position/Activity Restrictions: No pushing/pulling/lifting with RUE    Mobility  Bed Mobility Overal bed mobility: Needs Assistance Bed Mobility: Supine to Sit Rolling: Supervision   Supine to sit: Min assist     General bed mobility comments: min A to get bilateral legs off bed and then to bring trunk forward, vc's to perform task without too  much strain on RUE  Transfers Overall transfer level: Needs assistance Equipment used: None Transfers: Sit to/from [facility] Sit to Stand: Min assist Stand pivot transfers: Mod assist       General transfer comment: performed transfer with therapist in front of pt. Feet blocked and assist to position feet on floor with knees flexed as bilateral knees stiff. Mod A for power up but then min A to steady as pt slowly stepped right foot, assist needed to move left foot  Ambulation/Gait             General Gait Details: pt fatigued from HD, not tested today   Stairs            Wheelchair Mobility     Modified Rankin (Stroke Patients Only)       Balance Overall balance assessment: Needs assistance Sitting-balance support: No upper extremity supported;Feet supported Sitting balance-Leahy Scale: Good     Standing balance support: Bilateral upper extremity supported;During functional activity Standing balance-Leahy Scale: Poor Standing balance comment: requires UE support                    Cognition Arousal/Alertness: Awake/alert Behavior During Therapy: WFL for tasks assessed/performed Overall Cognitive Status: Within Functional Limits for tasks assessed                      Exercises      General Comments General comments (skin integrity, edema, etc.): discussed home environment and how pt was functioning PTA. Pt does not mobilize out of bed without assistance and level currently needed is comparable to level needed before surgery. Pt feels and therapist agrees, that she will be safe to return home with caregivers      Pertinent Vitals/Pain VSS    Home Living                      Prior Function            PT Goals (current goals can now be found in the care plan section) Acute Rehab PT Goals Patient Stated Goal: home PT Goal Formulation: With patient Time For Goal Achievement: 08/23/13 Potential to Achieve Goals: Good Progress towards  PT goals: Progressing toward goals    Frequency  Min 3X/week    PT Plan Discharge plan needs to be updated    Co-evaluation             End of Session Equipment Utilized During Treatment: Gait belt Activity Tolerance: Patient tolerated treatment well Patient left: in chair;with call bell/phone within reach     Time: 1414-1440 PT Time Calculation (min): 26 min  Charges:  $Therapeutic Activity: 23-37 mins                    G Codes:     Leighton Roach, PT  Acute Rehab Services  [place] 08/13/2013, 2:44 PM

## 2013-08-13 NOTE — Progress Notes (Signed)
5 Days Post-Op  Subjective: She is on dialysis so I can't take down her dressing now.  Nothing in the drain currently.  She has decided to go home and not to SNF.  Comfortable and OK on HD.  Objective: Vital signs in last 24 hours: Temp:  [97.9 F (36.6 C)-98.3 F (36.8 C)] 98.2 F (36.8 C) (06/08 0717) Pulse Rate:  [67-80] 80 (06/08 0930) Resp:  [15-18] 18 (06/08 0930) BP: (126-172)/(50-71) 126/65 mmHg (06/08 0930) SpO2:  [95 %-100 %] 98 % (06/08 0717) Weight:  [55.2 kg (121 lb 11.1 oz)-58.06 kg (128 lb)] 55.2 kg (121 lb 11.1 oz) (06/08 0717) Last BM Date: 08/12/13 680 PO recorded. 105 from the drain Afebrile, VSS Creatinine is 6.23, H/H is up to 9/26.5 Intake/Output from previous day: 06/07 0701 - 06/08 0700 In: 680 [P.O.:680] Out: 105 [Drains:105] Intake/Output this shift:    General appearance: alert, cooperative and no distress Resp: clear to auscultation bilaterally and anterior Chest wall: no tenderness, sore, dressing intact, no fresh blood noted in JP. GI: soft, non-tender; bowel sounds normal; no masses,  no organomegaly  Lab Results:   Recent Labs  08/13/13 0721  WBC 4.3  HGB 9.0*  HCT 26.5*  PLT 202    BMET  Recent Labs  08/13/13 0721  NA 132*  K 4.7  CL 93*  CO2 27  GLUCOSE 77  BUN 41*  CREATININE 6.23*  CALCIUM 7.4*   PT/INR No results found for this basename: LABPROT, INR,  in the last 72 hours   Recent Labs Lab 08/13/13 0721  ALBUMIN 2.3*     Lipase  No results found for this basename: lipase     Studies/Results: No results found.  Medications: . amLODipine  5 mg Oral Daily  . cinacalcet  30 mg Oral BID WC  . [START ON 08/15/2013] ferric gluconate (FERRLECIT/NULECIT) IV  62.5 mg Intravenous Weekly  . lanthanum  1,000 mg Oral TID WC  . metoprolol tartrate  25 mg Oral BID  . multivitamin  1 tablet Oral QHS    Assessment/Plan 1.  Invasive cancer right breast, receptor positive, HER-2-negative. Clinical stage T2, N0 Right  total mastectomy, [name]. Dalbert Batman, M.D., FACS, 08/07/2013. 2.  Hematoma right mastectomy wound, Evacuation hematoma right mastectomy wound, control of bleeders, Evacuation hematoma right mastectomy wound, control of bleeders, 08/08/13. 3.  ESRD on HD -  MWF 4.  ANEMIA, transfused 08/09/13 5.  Hypertension 6.  AODM 7.  Hepatitis C 8.  Dyslipidemia 9.  Hyperparathyroidism 10.  PVD    Plan:  Pt has decided not to go to SNF, I will ask case manager, OT and PT to see and decide what we need to get her home.        LOS: 6 days    Earnstine Regal 08/13/2013

## 2013-08-13 NOTE — Progress Notes (Signed)
[place] Tuesday IF POSSIBLE

## 2013-08-13 NOTE — Care Management Note (Signed)
    Page 1 of 1   08/13/2013     2:11:56 PM CARE MANAGEMENT NOTE 08/13/2013  Patient:  [name], [name]   Account Number:  [id number]  Date Initiated:  08/13/2013  Documentation initiated by:  Magdalen Spatz  Subjective/Objective Assessment:     Action/Plan:   Anticipated DC Date:  08/13/2013   Anticipated DC Plan:  [place]         Choice offered to / List presented to:  C-1 Patient        [place] arranged  HH-1 RN  [place]   Status of service:  Completed, signed off Medicare Important Message given?   (If response is "NO", the following Medicare IM given date fields will be blank) Date Medicare IM given:  08/11/2013 Date Additional Medicare IM given:    Discharge Disposition:  [place]  Per UR Regulation:  Reviewed for med. necessity/level of care/duration of stay  If discussed at [place] of Stay Meetings, dates discussed:    Comments:  08-13-13 Patient  confirmed  face sheet information . Patient has 2 wheelchairs , 2 walkers and a cane  at home already , she does not want a bedside commode / 3 in 1 . Patient has 2 private duty aides who help her at home.  Magdalen Spatz RN BSN

## 2013-08-13 NOTE — Progress Notes (Signed)
I watched her go from the chair to the bed.  It was a 2 person effort with a belt to get her up.  She has an appointment with the nurse for possible drain removal on 6/11.  I am setting up home Health and home PT/OT. She is getting Sorbitol for constipation and if she  is safe, we will plan to send home in the AM.

## 2013-08-13 NOTE — Clinical Social Work Note (Signed)
Per RNCM, patient has decided to go home and not pursue SNF placement. CSW will cancel SNF search and sign off- please re-consult if CSW needs arise.  Eduard Clos, MSW, [place]

## 2013-08-13 NOTE — Procedures (Signed)
Pt seen on HD.  Ap 60 Vp 200.  Pain controlled.  Eating well.  Has not moved bowels.  Sorbitol prn ordered.

## 2013-08-14 DIAGNOSIS — D631 Anemia in chronic kidney disease: Secondary | ICD-10-CM | POA: Diagnosis not present

## 2013-08-14 DIAGNOSIS — N186 End stage renal disease: Secondary | ICD-10-CM | POA: Diagnosis not present

## 2013-08-14 DIAGNOSIS — C50919 Malignant neoplasm of unspecified site of unspecified female breast: Secondary | ICD-10-CM | POA: Diagnosis not present

## 2013-08-14 MED ORDER — ACETAMINOPHEN 325 MG PO TABS: 650.0000 mg | ORAL_TABLET | ORAL | Status: DC | PRN

## 2013-08-14 MED ORDER — POLYETHYLENE GLYCOL 3350 17 G PO PACK
17.0000 g | PACK | Freq: Every day | ORAL | Status: DC
  Administered 2013-08-14: 17 g via ORAL
  Filled 2013-08-14: qty 1

## 2013-08-14 MED ORDER — POLYETHYLENE GLYCOL 3350 17 G PO PACK: 17.0000 g | PACK | Freq: Every day | ORAL | Status: DC | PRN

## 2013-08-14 MED ORDER — NEPRO/CARBSTEADY PO LIQD
237.0000 mL | Freq: Two times a day (BID) | ORAL | Status: DC
  Administered 2013-08-14: 237 mL via ORAL
  Filled 2013-08-14 (×4): qty 237

## 2013-08-14 MED ORDER — DARBEPOETIN ALFA-POLYSORBATE 100 MCG/0.5ML IJ SOLN: 100.0000 ug | INTRAMUSCULAR | Status: DC

## 2013-08-14 MED ORDER — DOXERCALCIFEROL 4 MCG/2ML IV SOLN: 4.0000 ug | INTRAVENOUS | Status: DC

## 2013-08-14 NOTE — Progress Notes (Signed)
[facility] Progress Note  Assessment/Plan: 1. S/o right mastectomy invasive right breast cancer with evacuation of hematoma 6/3- stable; drains still in 2. ESRD - MWF - HD tomorrow am if not d/c today - no heparin HD until drains out - then resume tight 3. Anemia - s/p transfusion for ABLA anemia - Hgb 9; Aranesp 100 given 6/3 - redose 6/10 if still here/ weekly IV Fe 4. Secondary hyperparathyroidism - sensipar and fosrenol; l off hectorol ; last iPTH 883 5/27 and hectorol 6 mcg had been stopped in previous month due to hypercalcemia - will resume hectorol at 4 mcg which will bring up Ca levels considerably; low P related to poor intake in hospital - if it continues to be low can decrease fosrenol 5. HTN/volume - net UF 2.5 6/8 with post weight 5.25 - (EDW 51) 6. Nutrition - renal + vit/ add bid nepro  Myriam Jacobson, PA-C [facility] [phone] 08/14/2013,10:13 AM  LOS: 7 days   Subjective:   Tells me she is going home today; took lax - waiting for it to work  Objective Filed Vitals:   08/13/13 1119 08/13/13 1140 08/13/13 2215 08/14/13 0605  BP: 144/65 136/45 160/54 132/58  Pulse: 73 77 68 67  Temp: 97.9 F (36.6 C) 98.2 F (36.8 C) 98.3 F (36.8 C) 98.1 F (36.7 C)  TempSrc: Oral Oral Oral Oral  Resp: 18 17 20 20   Height:      Weight: 52.5 kg (115 lb 11.9 oz)     SpO2: 99% 100% 100% 100%   Physical Exam General: sitting in bed, spirits good, NAD Heart: RRR Lungs: no wheezes or rales Abdomen: soft NT Extremities: no LE edema/SCDS in place Dialysis Access: left upper AVGG + bruit Other: JP drains small serosanguinous drainage  Dialysis Orders:  M[facility] 3h 51min F160 51kg 2/2.0 [place] PRof 4 400/A1.5 Heparin 1700 LUA AVG  Aranesp 25 ug every 2 weeks on Wed, last 5/20 Venofer 50 mg / wk I have seen and examined this patient and agree with plan per Amalia Hailey.  She can go from renal standpoint and FU at her HD center  tomorrow.Bennie Dallas 08/14/2013 11:19 AM Additional Objective Labs: Basic Metabolic Panel:  Recent Labs Lab 08/09/13 0500 08/10/13 0506 08/13/13 0721  NA 143 133* 132*  K 3.4* 5.0 4.7  CL 109 96 93*  CO2 25 25 27   GLUCOSE 71 80 77  BUN 18 29* 41*  CREATININE 2.98* 4.72* 6.23*  CALCIUM 6.9* 8.2* 7.4*  PHOS  --   --  2.4   Liver Function Tests:  Recent Labs Lab 08/13/13 0721  ALBUMIN 2.3*   CBC:  Recent Labs Lab 08/08/13 0326 08/08/13 2010 08/09/13 0500 08/10/13 0506 08/13/13 0721  WBC 6.3 4.3 3.6* 6.0 4.3  HGB 8.6* 7.2* 6.4* 9.7* 9.0*  HCT 25.2* 21.1* 18.6* 28.8* 26.5*  MCV 94.7 95.0 95.4 88.3 87.2  PLT 126* 152 152 156 202   CBG:  Recent Labs Lab 08/07/13 1507 08/08/13 1633 08/08/13 1914 08/09/13 1654  GLUCAP 73 123* 112* 124*  Medications:   . amLODipine  5 mg Oral Daily  . cinacalcet  30 mg Oral BID WC  . [START ON 08/15/2013] ferric gluconate (FERRLECIT/NULECIT) IV  62.5 mg Intravenous Weekly  . lanthanum  1,000 mg Oral TID WC  . metoprolol tartrate  25 mg Oral BID  . multivitamin  1 tablet Oral QHS  . polyethylene glycol  17 g Oral Daily

## 2013-08-14 NOTE — Progress Notes (Signed)
6 Days Post-Op  Subjective: She wants to go home but has not had a BM so far.  Mastectomy site looks good.  Objective: Vital signs in last 24 hours: Temp:  [97.9 F (36.6 C)-98.3 F (36.8 C)] 98.1 F (36.7 C) (06/09 0605) Pulse Rate:  [67-80] 67 (06/09 0605) Resp:  [16-20] 20 (06/09 0605) BP: (73-160)/(43-65) 132/58 mmHg (06/09 0605) SpO2:  [99 %-100 %] 100 % (06/09 0605) Weight:  [52.5 kg (115 lb 11.9 oz)] 52.5 kg (115 lb 11.9 oz) (06/08 1119) Last BM Date: 08/12/13 600 PO 75 from the drains No BM Afebrile, BP up now that she is off HD Labs stable Intake/Output from previous day: 06/08 0701 - 06/09 0700 In: 600 [P.O.:600] Out: 2779 [Drains:75] Intake/Output this shift:    General appearance: alert, cooperative and no distress Resp: clear to auscultation bilaterally.  Chest wall and mastectomy site look good.  Drains are serous  GI: soft, non-tender; bowel sounds normal; no masses,  no organomegaly  Lab Results:   Recent Labs  08/13/13 0721  WBC 4.3  HGB 9.0*  HCT 26.5*  PLT 202    BMET  Recent Labs  08/13/13 0721  NA 132*  K 4.7  CL 93*  CO2 27  GLUCOSE 77  BUN 41*  CREATININE 6.23*  CALCIUM 7.4*   PT/INR No results found for this basename: LABPROT, INR,  in the last 72 hours   Recent Labs Lab 08/13/13 0721  ALBUMIN 2.3*     Lipase  No results found for this basename: lipase     Studies/Results: No results found.  Medications: . amLODipine  5 mg Oral Daily  . cinacalcet  30 mg Oral BID WC  . [START ON 08/15/2013] ferric gluconate (FERRLECIT/NULECIT) IV  62.5 mg Intravenous Weekly  . lanthanum  1,000 mg Oral TID WC  . metoprolol tartrate  25 mg Oral BID  . multivitamin  1 tablet Oral QHS    Assessment/Plan 1. Invasive cancer right breast, receptor positive, HER-2-negative. Clinical stage T2, N0  Right total mastectomy, [name]. [name], M.D., FACS, 08/07/2013.  2. Hematoma right mastectomy wound, Evacuation hematoma right  mastectomy wound, control of bleeders, Evacuation hematoma right mastectomy wound, control of bleeders, 08/08/13.  3. ESRD on HD - MWF  4. ANEMIA, transfused 08/09/13  5. Hypertension  6. AODM  7. Hepatitis C  8. Dyslipidemia  9. Hyperparathyroidism  10. PVD   PlaN:  Give her some Miralax and if OK with Nephrology send home today.  I will ask Case manager to be sure everything is ready.   LOS: 7 days    [name] 08/14/2013

## 2013-08-14 NOTE — Progress Notes (Signed)
SEEN DISCUSSED AND AGREE

## 2013-08-14 NOTE — Progress Notes (Signed)
HOPEFULLY HOME

## 2013-08-14 NOTE — Progress Notes (Signed)
Physical Therapy Treatment Patient Details Name: [name] MRN: [id number] DOB: [birth year] Today's Date: 08/14/2013    History of Present Illness Admitted with invasive cancer R breast, 6/2 2015 s/p R total mastectomy. 08/08/2013 Evacuation hematoma right mastectomy wound, control of bleeders    PT Comments    Patient planning to DC home today fully equipped and with assistance from her at home caregivers that are familiar with her mobility. Continue with current POC  Follow Up Recommendations  Home health PT;Supervision for mobility/OOB     Equipment Recommendations  None recommended by PT    Recommendations for Other Services       Precautions / Restrictions Precautions Precautions: Fall Precaution Comments: decreased bend in Bil knees, L>R Restrictions Other Position/Activity Restrictions: No pushing/pulling/lifting with RUE    Mobility  Bed Mobility Overal bed mobility: Needs Assistance       Supine to sit: Min assist Sit to supine: Max assist   General bed mobility comments: min A to get bilateral legs off bed and then to bring trunk forward, vc's to perform task without too  much strain on RUE  Transfers Overall transfer level: Needs assistance Equipment used: None   Sit to Stand: Min assist Stand pivot transfers: Mod assist       General transfer comment: performed transfer with therapist in front of pt. Feet blocked and assist to position feet on floor with knees flexed as bilateral knees stiff. Mod A for power up. Patient with very little steppage today with transfer. Stated she felt stiffer  Ambulation/Gait                 Stairs            Wheelchair Mobility    Modified Rankin (Stroke Patients Only)       Balance     Sitting balance-Leahy Scale: Fair       Standing balance-Leahy Scale: Zero                      Cognition Arousal/Alertness: Awake/alert Behavior During Therapy: WFL for tasks  assessed/performed Overall Cognitive Status: Within Functional Limits for tasks assessed                      Exercises      General Comments        Pertinent Vitals/Pain no apparent distress     Home Living                      Prior Function            PT Goals (current goals can now be found in the care plan section) Progress towards PT goals: Progressing toward goals    Frequency  Min 3X/week    PT Plan Current plan remains appropriate    Co-evaluation             End of Session Equipment Utilized During Treatment: Gait belt Activity Tolerance: Patient tolerated treatment well Patient left: in chair;with call bell/phone within reach     Time: 1148-1208 PT Time Calculation (min): 20 min  Charges:  $Therapeutic Activity: 8-22 mins                    G Codes:      Tonia Brooms Ronisha Herringshaw 08/14/2013, 12:10 PM  08/14/2013 [place] PTA [phone] pager [phone] office

## 2013-08-15 ENCOUNTER — Encounter (INDEPENDENT_AMBULATORY_CARE_PROVIDER_SITE_OTHER): Payer: Medicare Other

## 2013-08-16 ENCOUNTER — Encounter (INDEPENDENT_AMBULATORY_CARE_PROVIDER_SITE_OTHER): Payer: Self-pay

## 2013-08-16 ENCOUNTER — Ambulatory Visit (INDEPENDENT_AMBULATORY_CARE_PROVIDER_SITE_OTHER): Payer: Medicare Other | Admitting: General Surgery

## 2013-08-16 VITALS — BP 118/56 | HR 64 | Temp 97.9°F | Resp 14 | Ht 63.0 in | Wt 110.0 lb

## 2013-08-16 DIAGNOSIS — Z4803 Encounter for change or removal of drains: Secondary | ICD-10-CM

## 2013-08-16 NOTE — Progress Notes (Signed)
Patient came in s/p Right mastectomy on 08/07/13 w/ 2 remaining drains in place.  The patient drain log is still showing an output of 30 cc for the past 48 hours.  Because of this, I left both drains in place.  I educated and demonstrated to the patient on how to "milk" the tubing and recharge her bulbs correctly.  I then checked her incision site, which has had no bleeding, inflammation, or drainage.  I recovered the area with the telfa pads on the mastectomy incision and then covered with ABD pads to help with comfort for the patient.  The patient denies any fever, chills, malaise, or discomfort. The patient was instructed to call our office on Tuesday for [place] to work them in as a nurse only appt if the drains have less than 15 cc ped day for a consecutive 2 days as ordered by Dr. Dalbert Batman.  The patient also has a f/u appt w/ Dr. Dalbert Batman on 08/23/13.

## 2013-08-17 ENCOUNTER — Telehealth (INDEPENDENT_AMBULATORY_CARE_PROVIDER_SITE_OTHER): Payer: Self-pay

## 2013-08-17 NOTE — Telephone Encounter (Signed)
[facility] called to inform us patient  is asking for home health to start 08/21/13.

## 2013-08-21 ENCOUNTER — Encounter (INDEPENDENT_AMBULATORY_CARE_PROVIDER_SITE_OTHER): Payer: Medicare Other

## 2013-08-22 ENCOUNTER — Encounter (INDEPENDENT_AMBULATORY_CARE_PROVIDER_SITE_OTHER): Payer: Medicare Other | Admitting: General Surgery

## 2013-08-23 ENCOUNTER — Ambulatory Visit (INDEPENDENT_AMBULATORY_CARE_PROVIDER_SITE_OTHER): Payer: Medicare Other | Admitting: General Surgery

## 2013-08-23 ENCOUNTER — Encounter (INDEPENDENT_AMBULATORY_CARE_PROVIDER_SITE_OTHER): Payer: Self-pay | Admitting: General Surgery

## 2013-08-23 ENCOUNTER — Telehealth (INDEPENDENT_AMBULATORY_CARE_PROVIDER_SITE_OTHER): Payer: Self-pay

## 2013-08-23 ENCOUNTER — Telehealth: Payer: Self-pay | Admitting: *Deleted

## 2013-08-23 VITALS — BP 130/65 | HR 87 | Temp 98.8°F | Resp 16 | Ht 63.0 in | Wt 110.0 lb

## 2013-08-23 DIAGNOSIS — C50519 Malignant neoplasm of lower-outer quadrant of unspecified female breast: Secondary | ICD-10-CM

## 2013-08-23 DIAGNOSIS — C50511 Malignant neoplasm of lower-outer quadrant of right female breast: Secondary | ICD-10-CM

## 2013-08-23 NOTE — Telephone Encounter (Signed)
Received referral from [place].  Emailed Dr. Alvy Bimler to see if she is able/willing to see this pt.  Emailed Cindy at [employer] to make her aware.

## 2013-08-23 NOTE — Progress Notes (Signed)
Patient ID: [name], female   DOB: 05-13-1932, 80 y.o.   MRN: [id number] History: This patient underwent right total mastectomy of 08/07/2013. She developed a hematoma in the breast and was returned to the operating room on June 3 for evacuation of hematoma and infusion of platelets. She has done very well since. Final pathology report showed invasive carcinoma, 3.5 cm, negative margins, ER 100%, PR-negative, Ki-67 9%, HER-2-negative. Final stage T2,Nx. She continues to get dialysis Monday Wednesday Friday schedule. She states that she feels good. Has no pain. Good appetite. Post op she has gone back home, which I think is a small group home.  Exam  patient looks well. Her caregiver and POA is with her. No distress Right mastectomy wound looks very good. The skin is healthy. No hematoma or infection. The drainage is down to less than 10 cc per day. Both drains were removed. All staples and sutures were removed. Benzoin and Steri-Strip was placed on the wound.  Assessment: Invasive ductal carcinoma right breast, 2.5 cm, recovering uneventfully following right total mastectomy with R. takeback for hematoma End stage renal disease on dialysis History of hepatitis C Hypertension Secondary hyperparathyroidism  Plan: Wound care discussed Return to physical therapy-[facility] Referred to medical oncology for decision regarding indigestion therapy Return to see me in one month.   [name]. Dalbert Batman, M.D., [facility] General and Minimally invasive Surgery Breast and Colorectal Surgery Office:   [phone] Pager:   [phone]    .

## 2013-08-23 NOTE — Patient Instructions (Signed)
Your right mastectomy wound is healing nicely now. There is no sign of bleeding and no sign of infection. We removed the drains and the staples and sutures today.  You will be referred to physical therapy to work on mobility of your right shoulder  You will be referred to medical oncology to see if any other form of treatment is necessary  Return to see Dr. Dalbert Batman in one month.

## 2013-08-23 NOTE — Telephone Encounter (Signed)
Referral for med onc in epic and given to [facility] to set up appt and call pt. Msg sent to [facility]. Pt is currently having PT by [place] so a new order was not put in epic. Caregiver will have [facility] call if any questions about rt shoulder rom PT.

## 2013-08-28 ENCOUNTER — Telehealth: Payer: Self-pay | Admitting: *Deleted

## 2013-08-28 ENCOUNTER — Telehealth (INDEPENDENT_AMBULATORY_CARE_PROVIDER_SITE_OTHER): Payer: Self-pay

## 2013-08-28 DIAGNOSIS — I120 Hypertensive chronic kidney disease with stage 5 chronic kidney disease or end stage renal disease: Secondary | ICD-10-CM | POA: Diagnosis not present

## 2013-08-28 DIAGNOSIS — Z483 Aftercare following surgery for neoplasm: Secondary | ICD-10-CM | POA: Diagnosis not present

## 2013-08-28 DIAGNOSIS — N186 End stage renal disease: Secondary | ICD-10-CM | POA: Diagnosis not present

## 2013-08-28 DIAGNOSIS — C50919 Malignant neoplasm of unspecified site of unspecified female breast: Secondary | ICD-10-CM | POA: Diagnosis not present

## 2013-08-28 DIAGNOSIS — Z992 Dependence on renal dialysis: Secondary | ICD-10-CM | POA: Diagnosis not present

## 2013-08-28 DIAGNOSIS — D649 Anemia, unspecified: Secondary | ICD-10-CM | POA: Diagnosis not present

## 2013-08-28 NOTE — Telephone Encounter (Signed)
Called and spoke with pt's caregiver [name] and confirmed 09/04/13 appt w/ her.  Mailed before appt letter, welcoming packet and intake form to pt.  Emailed Cindy at [employer] to make her aware.  Took paperwork to [facility] to create chart.

## 2013-08-28 NOTE — Telephone Encounter (Signed)
[name] from [facility] is calling to make Dr Dalbert Batman aware that pt has only agreed to have the home health nurses come out there twice from now until the patient comes back to see Dr Dalbert Batman on 10/11/13. Informed [name] that I would send Dr Dalbert Batman a message to make him aware.

## 2013-08-29 DIAGNOSIS — I120 Hypertensive chronic kidney disease with stage 5 chronic kidney disease or end stage renal disease: Secondary | ICD-10-CM | POA: Diagnosis not present

## 2013-08-29 DIAGNOSIS — Z992 Dependence on renal dialysis: Secondary | ICD-10-CM | POA: Diagnosis not present

## 2013-08-29 DIAGNOSIS — D649 Anemia, unspecified: Secondary | ICD-10-CM | POA: Diagnosis not present

## 2013-08-29 DIAGNOSIS — Z483 Aftercare following surgery for neoplasm: Secondary | ICD-10-CM | POA: Diagnosis not present

## 2013-08-29 DIAGNOSIS — N186 End stage renal disease: Secondary | ICD-10-CM | POA: Diagnosis not present

## 2013-08-29 DIAGNOSIS — C50919 Malignant neoplasm of unspecified site of unspecified female breast: Secondary | ICD-10-CM | POA: Diagnosis not present

## 2013-09-03 ENCOUNTER — Other Ambulatory Visit: Payer: Self-pay | Admitting: *Deleted

## 2013-09-03 ENCOUNTER — Telehealth (INDEPENDENT_AMBULATORY_CARE_PROVIDER_SITE_OTHER): Payer: Self-pay | Admitting: *Deleted

## 2013-09-03 DIAGNOSIS — C50919 Malignant neoplasm of unspecified site of unspecified female breast: Secondary | ICD-10-CM | POA: Diagnosis not present

## 2013-09-03 DIAGNOSIS — N186 End stage renal disease: Secondary | ICD-10-CM | POA: Diagnosis not present

## 2013-09-03 DIAGNOSIS — C50511 Malignant neoplasm of lower-outer quadrant of right female breast: Secondary | ICD-10-CM

## 2013-09-03 DIAGNOSIS — D649 Anemia, unspecified: Secondary | ICD-10-CM | POA: Diagnosis not present

## 2013-09-03 DIAGNOSIS — Z483 Aftercare following surgery for neoplasm: Secondary | ICD-10-CM | POA: Diagnosis not present

## 2013-09-03 DIAGNOSIS — Z992 Dependence on renal dialysis: Secondary | ICD-10-CM | POA: Diagnosis not present

## 2013-09-03 DIAGNOSIS — I120 Hypertensive chronic kidney disease with stage 5 chronic kidney disease or end stage renal disease: Secondary | ICD-10-CM | POA: Diagnosis not present

## 2013-09-03 NOTE — Telephone Encounter (Signed)
I would agree to this intensive physical therapy if she has limited range of motion of her shoulder.  If she has excellent range of motion of her shoulder and I do not think she should need PT that often. Asked them to document her current disability.  hmi

## 2013-09-03 NOTE — Telephone Encounter (Signed)
Wes from [place] would like a verbal order for PT 3 times a week for 4 weeks and 2 times a week for 2 weeks.  Please advise.  Anderson Malta

## 2013-09-04 ENCOUNTER — Ambulatory Visit (HOSPITAL_BASED_OUTPATIENT_CLINIC_OR_DEPARTMENT_OTHER): Payer: Medicare Other | Admitting: Oncology

## 2013-09-04 ENCOUNTER — Other Ambulatory Visit (HOSPITAL_BASED_OUTPATIENT_CLINIC_OR_DEPARTMENT_OTHER): Payer: Medicare Other

## 2013-09-04 ENCOUNTER — Encounter: Payer: Self-pay | Admitting: Oncology

## 2013-09-04 ENCOUNTER — Ambulatory Visit: Payer: Medicare Other

## 2013-09-04 VITALS — BP 134/52 | HR 66 | Temp 98.5°F | Resp 19 | Ht 63.0 in

## 2013-09-04 DIAGNOSIS — C50519 Malignant neoplasm of lower-outer quadrant of unspecified female breast: Secondary | ICD-10-CM | POA: Diagnosis not present

## 2013-09-04 DIAGNOSIS — D649 Anemia, unspecified: Secondary | ICD-10-CM | POA: Diagnosis not present

## 2013-09-04 DIAGNOSIS — N289 Disorder of kidney and ureter, unspecified: Secondary | ICD-10-CM

## 2013-09-04 DIAGNOSIS — Z483 Aftercare following surgery for neoplasm: Secondary | ICD-10-CM | POA: Diagnosis not present

## 2013-09-04 DIAGNOSIS — Z853 Personal history of malignant neoplasm of breast: Secondary | ICD-10-CM

## 2013-09-04 DIAGNOSIS — G8929 Other chronic pain: Secondary | ICD-10-CM

## 2013-09-04 DIAGNOSIS — C50511 Malignant neoplasm of lower-outer quadrant of right female breast: Secondary | ICD-10-CM

## 2013-09-04 DIAGNOSIS — M549 Dorsalgia, unspecified: Secondary | ICD-10-CM

## 2013-09-04 DIAGNOSIS — N186 End stage renal disease: Secondary | ICD-10-CM | POA: Diagnosis not present

## 2013-09-04 DIAGNOSIS — C50919 Malignant neoplasm of unspecified site of unspecified female breast: Secondary | ICD-10-CM | POA: Diagnosis not present

## 2013-09-04 DIAGNOSIS — Z992 Dependence on renal dialysis: Secondary | ICD-10-CM | POA: Diagnosis not present

## 2013-09-04 DIAGNOSIS — I120 Hypertensive chronic kidney disease with stage 5 chronic kidney disease or end stage renal disease: Secondary | ICD-10-CM | POA: Diagnosis not present

## 2013-09-04 DIAGNOSIS — I890 Lymphedema, not elsewhere classified: Secondary | ICD-10-CM

## 2013-09-04 LAB — COMPREHENSIVE METABOLIC PANEL (CC13)
ALT: 21 U/L (ref 0–55)
AST: 43 U/L — AB (ref 5–34)
Albumin: 2.7 g/dL — ABNORMAL LOW (ref 3.5–5.0)
Alkaline Phosphatase: 152 U/L — ABNORMAL HIGH (ref 40–150)
Anion Gap: 11 mEq/L (ref 3–11)
BUN: 49.9 mg/dL — ABNORMAL HIGH (ref 7.0–26.0)
CALCIUM: 10.4 mg/dL (ref 8.4–10.4)
CHLORIDE: 97 meq/L — AB (ref 98–109)
CO2: 31 mEq/L — ABNORMAL HIGH (ref 22–29)
Creatinine: 6.2 mg/dL (ref 0.6–1.1)
Glucose: 116 mg/dl (ref 70–140)
Potassium: 4 mEq/L (ref 3.5–5.1)
Sodium: 139 mEq/L (ref 136–145)
Total Bilirubin: 0.54 mg/dL (ref 0.20–1.20)
Total Protein: 6.3 g/dL — ABNORMAL LOW (ref 6.4–8.3)

## 2013-09-04 MED ORDER — ANASTROZOLE 1 MG PO TABS: 1.0000 mg | ORAL_TABLET | Freq: Every day | ORAL | Status: DC

## 2013-09-04 NOTE — Progress Notes (Signed)
Please see consult note.  

## 2013-09-04 NOTE — Telephone Encounter (Signed)
Returned [employer] call regarding PT for pt.. Please see message below from Dr. Dalbert Batman.  Anderson Malta
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
## 2013-09-04 NOTE — Progress Notes (Signed)
Checked in new patient with no financial issues prior to seeing the dr., POA-W. White with her will bring back papers to scan. She has not been out of the country and Ms., Dema Severin signed for patient,.

## 2013-09-05 DIAGNOSIS — D631 Anemia in chronic kidney disease: Secondary | ICD-10-CM | POA: Diagnosis not present

## 2013-09-05 DIAGNOSIS — E119 Type 2 diabetes mellitus without complications: Secondary | ICD-10-CM | POA: Diagnosis not present

## 2013-09-05 DIAGNOSIS — D509 Iron deficiency anemia, unspecified: Secondary | ICD-10-CM | POA: Diagnosis not present

## 2013-09-05 DIAGNOSIS — N186 End stage renal disease: Secondary | ICD-10-CM | POA: Diagnosis not present

## 2013-09-05 DIAGNOSIS — N2581 Secondary hyperparathyroidism of renal origin: Secondary | ICD-10-CM | POA: Diagnosis not present

## 2013-09-06 ENCOUNTER — Telehealth: Payer: Self-pay | Admitting: Oncology

## 2013-09-06 DIAGNOSIS — N186 End stage renal disease: Secondary | ICD-10-CM | POA: Diagnosis not present

## 2013-09-06 DIAGNOSIS — D649 Anemia, unspecified: Secondary | ICD-10-CM | POA: Diagnosis not present

## 2013-09-06 DIAGNOSIS — Z483 Aftercare following surgery for neoplasm: Secondary | ICD-10-CM | POA: Diagnosis not present

## 2013-09-06 DIAGNOSIS — I120 Hypertensive chronic kidney disease with stage 5 chronic kidney disease or end stage renal disease: Secondary | ICD-10-CM | POA: Diagnosis not present

## 2013-09-06 DIAGNOSIS — C50919 Malignant neoplasm of unspecified site of unspecified female breast: Secondary | ICD-10-CM | POA: Diagnosis not present

## 2013-09-06 DIAGNOSIS — Z992 Dependence on renal dialysis: Secondary | ICD-10-CM | POA: Diagnosis not present

## 2013-09-06 NOTE — Telephone Encounter (Signed)
Wes from [facility] called back.  I relayed Dr. Darrel Hoover message regarding the PT request.  Per Wes, he actually wanted it more for her walking.  I advised him that Dr. Dalbert Batman done surgery on the breast and that if her range of motion was good in her shoulder, then Dr. Dalbert Batman didn't think she would need that severe PT.  Wes informed me at this time that Occupational Therapy was actually treating her PT for her shoulder so he advised me that he would contact her PCP for his request.  Anderson Malta

## 2013-09-06 NOTE — Telephone Encounter (Signed)
s/w wanda (POA) re appt for 10/1.

## 2013-09-11 DIAGNOSIS — Z992 Dependence on renal dialysis: Secondary | ICD-10-CM | POA: Diagnosis not present

## 2013-09-11 DIAGNOSIS — N186 End stage renal disease: Secondary | ICD-10-CM | POA: Diagnosis not present

## 2013-09-11 DIAGNOSIS — I120 Hypertensive chronic kidney disease with stage 5 chronic kidney disease or end stage renal disease: Secondary | ICD-10-CM | POA: Diagnosis not present

## 2013-09-11 DIAGNOSIS — D649 Anemia, unspecified: Secondary | ICD-10-CM | POA: Diagnosis not present

## 2013-09-11 DIAGNOSIS — Z483 Aftercare following surgery for neoplasm: Secondary | ICD-10-CM | POA: Diagnosis not present

## 2013-09-11 DIAGNOSIS — C50919 Malignant neoplasm of unspecified site of unspecified female breast: Secondary | ICD-10-CM | POA: Diagnosis not present

## 2013-09-13 DIAGNOSIS — D649 Anemia, unspecified: Secondary | ICD-10-CM | POA: Diagnosis not present

## 2013-09-13 DIAGNOSIS — C50919 Malignant neoplasm of unspecified site of unspecified female breast: Secondary | ICD-10-CM | POA: Diagnosis not present

## 2013-09-13 DIAGNOSIS — N186 End stage renal disease: Secondary | ICD-10-CM | POA: Diagnosis not present

## 2013-09-13 DIAGNOSIS — I871 Compression of vein: Secondary | ICD-10-CM | POA: Diagnosis not present

## 2013-09-13 DIAGNOSIS — I120 Hypertensive chronic kidney disease with stage 5 chronic kidney disease or end stage renal disease: Secondary | ICD-10-CM | POA: Diagnosis not present

## 2013-09-13 DIAGNOSIS — Z483 Aftercare following surgery for neoplasm: Secondary | ICD-10-CM | POA: Diagnosis not present

## 2013-09-13 DIAGNOSIS — Z992 Dependence on renal dialysis: Secondary | ICD-10-CM | POA: Diagnosis not present

## 2013-09-13 DIAGNOSIS — T82898A Other specified complication of vascular prosthetic devices, implants and grafts, initial encounter: Secondary | ICD-10-CM | POA: Diagnosis not present

## 2013-09-14 DIAGNOSIS — C50919 Malignant neoplasm of unspecified site of unspecified female breast: Secondary | ICD-10-CM | POA: Diagnosis not present

## 2013-09-14 DIAGNOSIS — D649 Anemia, unspecified: Secondary | ICD-10-CM | POA: Diagnosis not present

## 2013-09-14 DIAGNOSIS — Z483 Aftercare following surgery for neoplasm: Secondary | ICD-10-CM | POA: Diagnosis not present

## 2013-09-14 DIAGNOSIS — I120 Hypertensive chronic kidney disease with stage 5 chronic kidney disease or end stage renal disease: Secondary | ICD-10-CM | POA: Diagnosis not present

## 2013-09-14 DIAGNOSIS — Z992 Dependence on renal dialysis: Secondary | ICD-10-CM | POA: Diagnosis not present

## 2013-09-14 DIAGNOSIS — N186 End stage renal disease: Secondary | ICD-10-CM | POA: Diagnosis not present

## 2013-09-17 DIAGNOSIS — N186 End stage renal disease: Secondary | ICD-10-CM | POA: Diagnosis not present

## 2013-09-17 DIAGNOSIS — Z483 Aftercare following surgery for neoplasm: Secondary | ICD-10-CM | POA: Diagnosis not present

## 2013-09-17 DIAGNOSIS — D649 Anemia, unspecified: Secondary | ICD-10-CM | POA: Diagnosis not present

## 2013-09-17 DIAGNOSIS — Z992 Dependence on renal dialysis: Secondary | ICD-10-CM | POA: Diagnosis not present

## 2013-09-17 DIAGNOSIS — C50919 Malignant neoplasm of unspecified site of unspecified female breast: Secondary | ICD-10-CM | POA: Diagnosis not present

## 2013-09-17 DIAGNOSIS — I120 Hypertensive chronic kidney disease with stage 5 chronic kidney disease or end stage renal disease: Secondary | ICD-10-CM | POA: Diagnosis not present

## 2013-09-18 DIAGNOSIS — N186 End stage renal disease: Secondary | ICD-10-CM | POA: Diagnosis not present

## 2013-09-18 DIAGNOSIS — I120 Hypertensive chronic kidney disease with stage 5 chronic kidney disease or end stage renal disease: Secondary | ICD-10-CM | POA: Diagnosis not present

## 2013-09-18 DIAGNOSIS — D649 Anemia, unspecified: Secondary | ICD-10-CM | POA: Diagnosis not present

## 2013-09-18 DIAGNOSIS — C50919 Malignant neoplasm of unspecified site of unspecified female breast: Secondary | ICD-10-CM | POA: Diagnosis not present

## 2013-09-18 DIAGNOSIS — Z483 Aftercare following surgery for neoplasm: Secondary | ICD-10-CM | POA: Diagnosis not present

## 2013-09-18 DIAGNOSIS — Z992 Dependence on renal dialysis: Secondary | ICD-10-CM | POA: Diagnosis not present

## 2013-09-20 DIAGNOSIS — D649 Anemia, unspecified: Secondary | ICD-10-CM | POA: Diagnosis not present

## 2013-09-20 DIAGNOSIS — I120 Hypertensive chronic kidney disease with stage 5 chronic kidney disease or end stage renal disease: Secondary | ICD-10-CM | POA: Diagnosis not present

## 2013-09-20 DIAGNOSIS — N186 End stage renal disease: Secondary | ICD-10-CM | POA: Diagnosis not present

## 2013-09-20 DIAGNOSIS — Z992 Dependence on renal dialysis: Secondary | ICD-10-CM | POA: Diagnosis not present

## 2013-09-20 DIAGNOSIS — C50919 Malignant neoplasm of unspecified site of unspecified female breast: Secondary | ICD-10-CM | POA: Diagnosis not present

## 2013-09-20 DIAGNOSIS — Z483 Aftercare following surgery for neoplasm: Secondary | ICD-10-CM | POA: Diagnosis not present

## 2013-09-24 DIAGNOSIS — N186 End stage renal disease: Secondary | ICD-10-CM | POA: Diagnosis not present

## 2013-09-24 DIAGNOSIS — C50919 Malignant neoplasm of unspecified site of unspecified female breast: Secondary | ICD-10-CM | POA: Diagnosis not present

## 2013-09-24 DIAGNOSIS — Z483 Aftercare following surgery for neoplasm: Secondary | ICD-10-CM | POA: Diagnosis not present

## 2013-09-24 DIAGNOSIS — Z992 Dependence on renal dialysis: Secondary | ICD-10-CM | POA: Diagnosis not present

## 2013-09-24 DIAGNOSIS — I120 Hypertensive chronic kidney disease with stage 5 chronic kidney disease or end stage renal disease: Secondary | ICD-10-CM | POA: Diagnosis not present

## 2013-09-24 DIAGNOSIS — D649 Anemia, unspecified: Secondary | ICD-10-CM | POA: Diagnosis not present

## 2013-09-25 DIAGNOSIS — N186 End stage renal disease: Secondary | ICD-10-CM | POA: Diagnosis not present

## 2013-09-25 DIAGNOSIS — D649 Anemia, unspecified: Secondary | ICD-10-CM | POA: Diagnosis not present

## 2013-09-25 DIAGNOSIS — Z483 Aftercare following surgery for neoplasm: Secondary | ICD-10-CM | POA: Diagnosis not present

## 2013-09-25 DIAGNOSIS — C50919 Malignant neoplasm of unspecified site of unspecified female breast: Secondary | ICD-10-CM | POA: Diagnosis not present

## 2013-09-25 DIAGNOSIS — I120 Hypertensive chronic kidney disease with stage 5 chronic kidney disease or end stage renal disease: Secondary | ICD-10-CM | POA: Diagnosis not present

## 2013-09-25 DIAGNOSIS — Z992 Dependence on renal dialysis: Secondary | ICD-10-CM | POA: Diagnosis not present

## 2013-09-26 DIAGNOSIS — Z483 Aftercare following surgery for neoplasm: Secondary | ICD-10-CM | POA: Diagnosis not present

## 2013-09-26 DIAGNOSIS — E1129 Type 2 diabetes mellitus with other diabetic kidney complication: Secondary | ICD-10-CM | POA: Diagnosis not present

## 2013-09-26 DIAGNOSIS — Z992 Dependence on renal dialysis: Secondary | ICD-10-CM | POA: Diagnosis not present

## 2013-09-26 DIAGNOSIS — N186 End stage renal disease: Secondary | ICD-10-CM | POA: Diagnosis not present

## 2013-09-26 DIAGNOSIS — C50919 Malignant neoplasm of unspecified site of unspecified female breast: Secondary | ICD-10-CM | POA: Diagnosis not present

## 2013-09-26 DIAGNOSIS — I120 Hypertensive chronic kidney disease with stage 5 chronic kidney disease or end stage renal disease: Secondary | ICD-10-CM | POA: Diagnosis not present

## 2013-09-26 DIAGNOSIS — D649 Anemia, unspecified: Secondary | ICD-10-CM | POA: Diagnosis not present

## 2013-09-27 DIAGNOSIS — I120 Hypertensive chronic kidney disease with stage 5 chronic kidney disease or end stage renal disease: Secondary | ICD-10-CM | POA: Diagnosis not present

## 2013-09-27 DIAGNOSIS — Z483 Aftercare following surgery for neoplasm: Secondary | ICD-10-CM | POA: Diagnosis not present

## 2013-09-27 DIAGNOSIS — N186 End stage renal disease: Secondary | ICD-10-CM | POA: Diagnosis not present

## 2013-09-27 DIAGNOSIS — C50919 Malignant neoplasm of unspecified site of unspecified female breast: Secondary | ICD-10-CM | POA: Diagnosis not present

## 2013-09-27 DIAGNOSIS — D649 Anemia, unspecified: Secondary | ICD-10-CM | POA: Diagnosis not present

## 2013-09-27 DIAGNOSIS — Z992 Dependence on renal dialysis: Secondary | ICD-10-CM | POA: Diagnosis not present

## 2013-10-02 DIAGNOSIS — D649 Anemia, unspecified: Secondary | ICD-10-CM | POA: Diagnosis not present

## 2013-10-02 DIAGNOSIS — Z992 Dependence on renal dialysis: Secondary | ICD-10-CM | POA: Diagnosis not present

## 2013-10-02 DIAGNOSIS — N186 End stage renal disease: Secondary | ICD-10-CM | POA: Diagnosis not present

## 2013-10-02 DIAGNOSIS — C50919 Malignant neoplasm of unspecified site of unspecified female breast: Secondary | ICD-10-CM | POA: Diagnosis not present

## 2013-10-02 DIAGNOSIS — Z483 Aftercare following surgery for neoplasm: Secondary | ICD-10-CM | POA: Diagnosis not present

## 2013-10-02 DIAGNOSIS — I120 Hypertensive chronic kidney disease with stage 5 chronic kidney disease or end stage renal disease: Secondary | ICD-10-CM | POA: Diagnosis not present

## 2013-10-04 DIAGNOSIS — D649 Anemia, unspecified: Secondary | ICD-10-CM | POA: Diagnosis not present

## 2013-10-04 DIAGNOSIS — C50919 Malignant neoplasm of unspecified site of unspecified female breast: Secondary | ICD-10-CM | POA: Diagnosis not present

## 2013-10-04 DIAGNOSIS — I120 Hypertensive chronic kidney disease with stage 5 chronic kidney disease or end stage renal disease: Secondary | ICD-10-CM | POA: Diagnosis not present

## 2013-10-04 DIAGNOSIS — N186 End stage renal disease: Secondary | ICD-10-CM | POA: Diagnosis not present

## 2013-10-04 DIAGNOSIS — Z992 Dependence on renal dialysis: Secondary | ICD-10-CM | POA: Diagnosis not present

## 2013-10-04 DIAGNOSIS — Z483 Aftercare following surgery for neoplasm: Secondary | ICD-10-CM | POA: Diagnosis not present

## 2013-10-05 DIAGNOSIS — N186 End stage renal disease: Secondary | ICD-10-CM | POA: Diagnosis not present

## 2013-10-08 DIAGNOSIS — N039 Chronic nephritic syndrome with unspecified morphologic changes: Secondary | ICD-10-CM | POA: Diagnosis not present

## 2013-10-08 DIAGNOSIS — N2581 Secondary hyperparathyroidism of renal origin: Secondary | ICD-10-CM | POA: Diagnosis not present

## 2013-10-08 DIAGNOSIS — E119 Type 2 diabetes mellitus without complications: Secondary | ICD-10-CM | POA: Diagnosis not present

## 2013-10-08 DIAGNOSIS — D631 Anemia in chronic kidney disease: Secondary | ICD-10-CM | POA: Diagnosis not present

## 2013-10-08 DIAGNOSIS — N186 End stage renal disease: Secondary | ICD-10-CM | POA: Diagnosis not present

## 2013-10-08 DIAGNOSIS — D509 Iron deficiency anemia, unspecified: Secondary | ICD-10-CM | POA: Diagnosis not present

## 2013-10-09 DIAGNOSIS — Z992 Dependence on renal dialysis: Secondary | ICD-10-CM | POA: Diagnosis not present

## 2013-10-09 DIAGNOSIS — N186 End stage renal disease: Secondary | ICD-10-CM | POA: Diagnosis not present

## 2013-10-09 DIAGNOSIS — I120 Hypertensive chronic kidney disease with stage 5 chronic kidney disease or end stage renal disease: Secondary | ICD-10-CM | POA: Diagnosis not present

## 2013-10-09 DIAGNOSIS — Z483 Aftercare following surgery for neoplasm: Secondary | ICD-10-CM | POA: Diagnosis not present

## 2013-10-09 DIAGNOSIS — D649 Anemia, unspecified: Secondary | ICD-10-CM | POA: Diagnosis not present

## 2013-10-09 DIAGNOSIS — C50919 Malignant neoplasm of unspecified site of unspecified female breast: Secondary | ICD-10-CM | POA: Diagnosis not present

## 2013-10-10 DIAGNOSIS — E119 Type 2 diabetes mellitus without complications: Secondary | ICD-10-CM | POA: Diagnosis not present

## 2013-10-10 DIAGNOSIS — N2581 Secondary hyperparathyroidism of renal origin: Secondary | ICD-10-CM | POA: Diagnosis not present

## 2013-10-10 DIAGNOSIS — D631 Anemia in chronic kidney disease: Secondary | ICD-10-CM | POA: Diagnosis not present

## 2013-10-10 DIAGNOSIS — N186 End stage renal disease: Secondary | ICD-10-CM | POA: Diagnosis not present

## 2013-10-10 DIAGNOSIS — D509 Iron deficiency anemia, unspecified: Secondary | ICD-10-CM | POA: Diagnosis not present

## 2013-10-11 ENCOUNTER — Encounter (INDEPENDENT_AMBULATORY_CARE_PROVIDER_SITE_OTHER): Payer: Self-pay | Admitting: General Surgery

## 2013-10-11 ENCOUNTER — Ambulatory Visit (INDEPENDENT_AMBULATORY_CARE_PROVIDER_SITE_OTHER): Payer: Medicare Other | Admitting: General Surgery

## 2013-10-11 DIAGNOSIS — D649 Anemia, unspecified: Secondary | ICD-10-CM | POA: Diagnosis not present

## 2013-10-11 DIAGNOSIS — C50511 Malignant neoplasm of lower-outer quadrant of right female breast: Secondary | ICD-10-CM

## 2013-10-11 DIAGNOSIS — Z992 Dependence on renal dialysis: Secondary | ICD-10-CM | POA: Diagnosis not present

## 2013-10-11 DIAGNOSIS — C50519 Malignant neoplasm of lower-outer quadrant of unspecified female breast: Secondary | ICD-10-CM

## 2013-10-11 DIAGNOSIS — I120 Hypertensive chronic kidney disease with stage 5 chronic kidney disease or end stage renal disease: Secondary | ICD-10-CM | POA: Diagnosis not present

## 2013-10-11 DIAGNOSIS — C50919 Malignant neoplasm of unspecified site of unspecified female breast: Secondary | ICD-10-CM | POA: Diagnosis not present

## 2013-10-11 DIAGNOSIS — Z483 Aftercare following surgery for neoplasm: Secondary | ICD-10-CM | POA: Diagnosis not present

## 2013-10-11 DIAGNOSIS — N186 End stage renal disease: Secondary | ICD-10-CM | POA: Diagnosis not present

## 2013-10-11 NOTE — Progress Notes (Signed)
Patient ID: [name], female   DOB: 05-26-1932, 80 y.o.   MRN: [id number]  History:  This patient underwent right total mastectomy of 08/07/2013. She developed a hematoma in the breast and was returned to the operating room on June 3 for evacuation of hematoma and infusion of platelets. She has done very well since.  Final pathology report showed invasive carcinoma, 3.5 cm, negative margins, ER 100%, PR-negative, Ki-67 9%, HER-2-negative. Final stage T2,Nx.  She has ESRD and continues to get dialysis Monday Wednesday Friday schedule.  She states that she feels good. Has no pain. Good appetite. Post op she has gone back home, which I think is a small group home.  She had home health PT for a while, but her range of motion is back to baseline She has seen Dr. Alen Blew for medical oncology consultation. He started her on arimidex. . That seems like a good idea.  Exam  patient looks well. Her caregiver and POA is with her. No distress  Right mastectomy wound looks very good. The skin is healthy. No hematoma or infection. Skin flaps viable. Range of motion of both shoulders is somewhat limited to about 100 to 110. This is baseline.  Assessment:  Invasive ductal carcinoma right breast, 2.5 cm, recovering uneventfully following right total mastectomy with  takeback for hematoma  End stage renal disease on dialysis  History of hepatitis C  Hypertension  Secondary hyperparathyroidism   Plan:  Continued to stretch both shoulders as much as possible. Continue antiestrogen therapy return to see me in 6 months.    [name]. Dalbert Batman, M.D., [facility]  General and Minimally invasive Surgery  Breast and Colorectal Surgery  Office: [phone]  Pager: [phone]

## 2013-10-11 NOTE — Patient Instructions (Signed)
Your right mastectomy wound has now healed nicely and there is no sign of any further complication.  Range of motion of both right and left shoulders seemed to be back to baseline. Continue to stretch as much as possible  I agree that you should continue to take the arimidex pill that Dr. Alen Blew gave you.  Return to see Dr. Dalbert Batman in 6 months

## 2013-10-12 DIAGNOSIS — N2581 Secondary hyperparathyroidism of renal origin: Secondary | ICD-10-CM | POA: Diagnosis not present

## 2013-10-12 DIAGNOSIS — D509 Iron deficiency anemia, unspecified: Secondary | ICD-10-CM | POA: Diagnosis not present

## 2013-10-12 DIAGNOSIS — D631 Anemia in chronic kidney disease: Secondary | ICD-10-CM | POA: Diagnosis not present

## 2013-10-12 DIAGNOSIS — N186 End stage renal disease: Secondary | ICD-10-CM | POA: Diagnosis not present

## 2013-10-12 DIAGNOSIS — E119 Type 2 diabetes mellitus without complications: Secondary | ICD-10-CM | POA: Diagnosis not present

## 2013-10-15 DIAGNOSIS — N186 End stage renal disease: Secondary | ICD-10-CM | POA: Diagnosis not present

## 2013-10-15 DIAGNOSIS — D631 Anemia in chronic kidney disease: Secondary | ICD-10-CM | POA: Diagnosis not present

## 2013-10-15 DIAGNOSIS — D509 Iron deficiency anemia, unspecified: Secondary | ICD-10-CM | POA: Diagnosis not present

## 2013-10-15 DIAGNOSIS — N2581 Secondary hyperparathyroidism of renal origin: Secondary | ICD-10-CM | POA: Diagnosis not present

## 2013-10-15 DIAGNOSIS — E119 Type 2 diabetes mellitus without complications: Secondary | ICD-10-CM | POA: Diagnosis not present

## 2013-10-17 DIAGNOSIS — N2581 Secondary hyperparathyroidism of renal origin: Secondary | ICD-10-CM | POA: Diagnosis not present

## 2013-10-17 DIAGNOSIS — Z483 Aftercare following surgery for neoplasm: Secondary | ICD-10-CM | POA: Diagnosis not present

## 2013-10-17 DIAGNOSIS — N186 End stage renal disease: Secondary | ICD-10-CM | POA: Diagnosis not present

## 2013-10-17 DIAGNOSIS — Z992 Dependence on renal dialysis: Secondary | ICD-10-CM | POA: Diagnosis not present

## 2013-10-17 DIAGNOSIS — I120 Hypertensive chronic kidney disease with stage 5 chronic kidney disease or end stage renal disease: Secondary | ICD-10-CM | POA: Diagnosis not present

## 2013-10-17 DIAGNOSIS — D631 Anemia in chronic kidney disease: Secondary | ICD-10-CM | POA: Diagnosis not present

## 2013-10-17 DIAGNOSIS — C50919 Malignant neoplasm of unspecified site of unspecified female breast: Secondary | ICD-10-CM | POA: Diagnosis not present

## 2013-10-17 DIAGNOSIS — D649 Anemia, unspecified: Secondary | ICD-10-CM | POA: Diagnosis not present

## 2013-10-17 DIAGNOSIS — D509 Iron deficiency anemia, unspecified: Secondary | ICD-10-CM | POA: Diagnosis not present

## 2013-10-17 DIAGNOSIS — E119 Type 2 diabetes mellitus without complications: Secondary | ICD-10-CM | POA: Diagnosis not present

## 2013-10-18 DIAGNOSIS — I120 Hypertensive chronic kidney disease with stage 5 chronic kidney disease or end stage renal disease: Secondary | ICD-10-CM | POA: Diagnosis not present

## 2013-10-18 DIAGNOSIS — D649 Anemia, unspecified: Secondary | ICD-10-CM | POA: Diagnosis not present

## 2013-10-18 DIAGNOSIS — C50919 Malignant neoplasm of unspecified site of unspecified female breast: Secondary | ICD-10-CM | POA: Diagnosis not present

## 2013-10-18 DIAGNOSIS — Z992 Dependence on renal dialysis: Secondary | ICD-10-CM | POA: Diagnosis not present

## 2013-10-18 DIAGNOSIS — Z483 Aftercare following surgery for neoplasm: Secondary | ICD-10-CM | POA: Diagnosis not present

## 2013-10-18 DIAGNOSIS — N186 End stage renal disease: Secondary | ICD-10-CM | POA: Diagnosis not present

## 2013-10-19 DIAGNOSIS — D509 Iron deficiency anemia, unspecified: Secondary | ICD-10-CM | POA: Diagnosis not present

## 2013-10-19 DIAGNOSIS — N2581 Secondary hyperparathyroidism of renal origin: Secondary | ICD-10-CM | POA: Diagnosis not present

## 2013-10-19 DIAGNOSIS — E119 Type 2 diabetes mellitus without complications: Secondary | ICD-10-CM | POA: Diagnosis not present

## 2013-10-19 DIAGNOSIS — N186 End stage renal disease: Secondary | ICD-10-CM | POA: Diagnosis not present

## 2013-10-19 DIAGNOSIS — D631 Anemia in chronic kidney disease: Secondary | ICD-10-CM | POA: Diagnosis not present

## 2013-10-22 DIAGNOSIS — E119 Type 2 diabetes mellitus without complications: Secondary | ICD-10-CM | POA: Diagnosis not present

## 2013-10-22 DIAGNOSIS — D509 Iron deficiency anemia, unspecified: Secondary | ICD-10-CM | POA: Diagnosis not present

## 2013-10-22 DIAGNOSIS — N186 End stage renal disease: Secondary | ICD-10-CM | POA: Diagnosis not present

## 2013-10-22 DIAGNOSIS — D631 Anemia in chronic kidney disease: Secondary | ICD-10-CM | POA: Diagnosis not present

## 2013-10-22 DIAGNOSIS — N2581 Secondary hyperparathyroidism of renal origin: Secondary | ICD-10-CM | POA: Diagnosis not present

## 2013-10-23 DIAGNOSIS — I120 Hypertensive chronic kidney disease with stage 5 chronic kidney disease or end stage renal disease: Secondary | ICD-10-CM | POA: Diagnosis not present

## 2013-10-23 DIAGNOSIS — D649 Anemia, unspecified: Secondary | ICD-10-CM | POA: Diagnosis not present

## 2013-10-23 DIAGNOSIS — N186 End stage renal disease: Secondary | ICD-10-CM | POA: Diagnosis not present

## 2013-10-23 DIAGNOSIS — Z483 Aftercare following surgery for neoplasm: Secondary | ICD-10-CM | POA: Diagnosis not present

## 2013-10-23 DIAGNOSIS — C50919 Malignant neoplasm of unspecified site of unspecified female breast: Secondary | ICD-10-CM | POA: Diagnosis not present

## 2013-10-23 DIAGNOSIS — Z992 Dependence on renal dialysis: Secondary | ICD-10-CM | POA: Diagnosis not present

## 2013-10-24 DIAGNOSIS — N186 End stage renal disease: Secondary | ICD-10-CM | POA: Diagnosis not present

## 2013-10-24 DIAGNOSIS — D509 Iron deficiency anemia, unspecified: Secondary | ICD-10-CM | POA: Diagnosis not present

## 2013-10-24 DIAGNOSIS — N2581 Secondary hyperparathyroidism of renal origin: Secondary | ICD-10-CM | POA: Diagnosis not present

## 2013-10-24 DIAGNOSIS — D631 Anemia in chronic kidney disease: Secondary | ICD-10-CM | POA: Diagnosis not present

## 2013-10-24 DIAGNOSIS — E119 Type 2 diabetes mellitus without complications: Secondary | ICD-10-CM | POA: Diagnosis not present

## 2013-10-25 DIAGNOSIS — Z483 Aftercare following surgery for neoplasm: Secondary | ICD-10-CM | POA: Diagnosis not present

## 2013-10-25 DIAGNOSIS — I120 Hypertensive chronic kidney disease with stage 5 chronic kidney disease or end stage renal disease: Secondary | ICD-10-CM | POA: Diagnosis not present

## 2013-10-25 DIAGNOSIS — D649 Anemia, unspecified: Secondary | ICD-10-CM | POA: Diagnosis not present

## 2013-10-25 DIAGNOSIS — N186 End stage renal disease: Secondary | ICD-10-CM | POA: Diagnosis not present

## 2013-10-25 DIAGNOSIS — Z992 Dependence on renal dialysis: Secondary | ICD-10-CM | POA: Diagnosis not present

## 2013-10-25 DIAGNOSIS — C50919 Malignant neoplasm of unspecified site of unspecified female breast: Secondary | ICD-10-CM | POA: Diagnosis not present

## 2013-10-26 DIAGNOSIS — D509 Iron deficiency anemia, unspecified: Secondary | ICD-10-CM | POA: Diagnosis not present

## 2013-10-26 DIAGNOSIS — E119 Type 2 diabetes mellitus without complications: Secondary | ICD-10-CM | POA: Diagnosis not present

## 2013-10-26 DIAGNOSIS — D631 Anemia in chronic kidney disease: Secondary | ICD-10-CM | POA: Diagnosis not present

## 2013-10-26 DIAGNOSIS — N2581 Secondary hyperparathyroidism of renal origin: Secondary | ICD-10-CM | POA: Diagnosis not present

## 2013-10-26 DIAGNOSIS — N186 End stage renal disease: Secondary | ICD-10-CM | POA: Diagnosis not present

## 2013-10-29 DIAGNOSIS — D631 Anemia in chronic kidney disease: Secondary | ICD-10-CM | POA: Diagnosis not present

## 2013-10-29 DIAGNOSIS — N2581 Secondary hyperparathyroidism of renal origin: Secondary | ICD-10-CM | POA: Diagnosis not present

## 2013-10-29 DIAGNOSIS — E119 Type 2 diabetes mellitus without complications: Secondary | ICD-10-CM | POA: Diagnosis not present

## 2013-10-29 DIAGNOSIS — N186 End stage renal disease: Secondary | ICD-10-CM | POA: Diagnosis not present

## 2013-10-29 DIAGNOSIS — D509 Iron deficiency anemia, unspecified: Secondary | ICD-10-CM | POA: Diagnosis not present

## 2013-10-31 DIAGNOSIS — E119 Type 2 diabetes mellitus without complications: Secondary | ICD-10-CM | POA: Diagnosis not present

## 2013-10-31 DIAGNOSIS — N2581 Secondary hyperparathyroidism of renal origin: Secondary | ICD-10-CM | POA: Diagnosis not present

## 2013-10-31 DIAGNOSIS — D631 Anemia in chronic kidney disease: Secondary | ICD-10-CM | POA: Diagnosis not present

## 2013-10-31 DIAGNOSIS — D509 Iron deficiency anemia, unspecified: Secondary | ICD-10-CM | POA: Diagnosis not present

## 2013-10-31 DIAGNOSIS — N039 Chronic nephritic syndrome with unspecified morphologic changes: Secondary | ICD-10-CM | POA: Diagnosis not present

## 2013-10-31 DIAGNOSIS — N186 End stage renal disease: Secondary | ICD-10-CM | POA: Diagnosis not present

## 2013-11-02 DIAGNOSIS — D509 Iron deficiency anemia, unspecified: Secondary | ICD-10-CM | POA: Diagnosis not present

## 2013-11-02 DIAGNOSIS — N2581 Secondary hyperparathyroidism of renal origin: Secondary | ICD-10-CM | POA: Diagnosis not present

## 2013-11-02 DIAGNOSIS — D631 Anemia in chronic kidney disease: Secondary | ICD-10-CM | POA: Diagnosis not present

## 2013-11-02 DIAGNOSIS — E119 Type 2 diabetes mellitus without complications: Secondary | ICD-10-CM | POA: Diagnosis not present

## 2013-11-02 DIAGNOSIS — N039 Chronic nephritic syndrome with unspecified morphologic changes: Secondary | ICD-10-CM | POA: Diagnosis not present

## 2013-11-02 DIAGNOSIS — N186 End stage renal disease: Secondary | ICD-10-CM | POA: Diagnosis not present

## 2013-11-05 DIAGNOSIS — N2581 Secondary hyperparathyroidism of renal origin: Secondary | ICD-10-CM | POA: Diagnosis not present

## 2013-11-05 DIAGNOSIS — N186 End stage renal disease: Secondary | ICD-10-CM | POA: Diagnosis not present

## 2013-11-05 DIAGNOSIS — D631 Anemia in chronic kidney disease: Secondary | ICD-10-CM | POA: Diagnosis not present

## 2013-11-05 DIAGNOSIS — D509 Iron deficiency anemia, unspecified: Secondary | ICD-10-CM | POA: Diagnosis not present

## 2013-11-05 DIAGNOSIS — E119 Type 2 diabetes mellitus without complications: Secondary | ICD-10-CM | POA: Diagnosis not present

## 2013-11-07 DIAGNOSIS — N2581 Secondary hyperparathyroidism of renal origin: Secondary | ICD-10-CM | POA: Diagnosis not present

## 2013-11-07 DIAGNOSIS — D631 Anemia in chronic kidney disease: Secondary | ICD-10-CM | POA: Diagnosis not present

## 2013-11-07 DIAGNOSIS — N186 End stage renal disease: Secondary | ICD-10-CM | POA: Diagnosis not present

## 2013-11-07 DIAGNOSIS — D509 Iron deficiency anemia, unspecified: Secondary | ICD-10-CM | POA: Diagnosis not present

## 2013-11-07 DIAGNOSIS — E119 Type 2 diabetes mellitus without complications: Secondary | ICD-10-CM | POA: Diagnosis not present

## 2013-12-04 ENCOUNTER — Telehealth: Payer: Self-pay | Admitting: Oncology

## 2013-12-04 NOTE — Telephone Encounter (Signed)
Pt called to r/s for another day .......KJ

## 2013-12-05 DIAGNOSIS — N186 End stage renal disease: Secondary | ICD-10-CM | POA: Diagnosis not present

## 2013-12-06 ENCOUNTER — Ambulatory Visit: Payer: Medicare Other | Admitting: Oncology

## 2013-12-07 DIAGNOSIS — D631 Anemia in chronic kidney disease: Secondary | ICD-10-CM | POA: Diagnosis not present

## 2013-12-07 DIAGNOSIS — Z23 Encounter for immunization: Secondary | ICD-10-CM | POA: Diagnosis not present

## 2013-12-07 DIAGNOSIS — N2581 Secondary hyperparathyroidism of renal origin: Secondary | ICD-10-CM | POA: Diagnosis not present

## 2013-12-07 DIAGNOSIS — E119 Type 2 diabetes mellitus without complications: Secondary | ICD-10-CM | POA: Diagnosis not present

## 2013-12-07 DIAGNOSIS — N186 End stage renal disease: Secondary | ICD-10-CM | POA: Diagnosis not present

## 2013-12-07 DIAGNOSIS — D509 Iron deficiency anemia, unspecified: Secondary | ICD-10-CM | POA: Diagnosis not present

## 2013-12-13 ENCOUNTER — Telehealth: Payer: Self-pay | Admitting: Oncology

## 2013-12-13 NOTE — Telephone Encounter (Signed)
recieved vm from [name] stating this is her 2nd or 3rd time calling and has not gotten a response. per pt recieved message re an appt she has in or office on tomorrow @ 8am and she is trying to r/s. pt has no appt in our office on tomorrow - pt next appt is 11/3. returned call to [place] but was not able to reach her and vm is full.

## 2013-12-14 ENCOUNTER — Telehealth: Payer: Self-pay | Admitting: Oncology

## 2013-12-14 NOTE — Telephone Encounter (Signed)
s/w [name] today and pt will keep appt as scheduled 11/3.

## 2013-12-21 ENCOUNTER — Other Ambulatory Visit: Payer: Self-pay

## 2013-12-27 ENCOUNTER — Ambulatory Visit
Admission: RE | Admit: 2013-12-27 | Discharge: 2013-12-27 | Disposition: A | Discharge status: Home / Self Care | Payer: Medicare Other | Source: Ambulatory Visit | Attending: Nephrology | Admitting: Nephrology

## 2013-12-27 ENCOUNTER — Other Ambulatory Visit: Payer: Self-pay | Admitting: Nephrology

## 2013-12-27 DIAGNOSIS — R053 Chronic cough: Secondary | ICD-10-CM

## 2013-12-27 DIAGNOSIS — R05 Cough: Secondary | ICD-10-CM

## 2013-12-27 DIAGNOSIS — J310 Chronic rhinitis: Secondary | ICD-10-CM | POA: Diagnosis not present

## 2014-01-02 DIAGNOSIS — E1129 Type 2 diabetes mellitus with other diabetic kidney complication: Secondary | ICD-10-CM | POA: Diagnosis not present

## 2014-01-05 DIAGNOSIS — N186 End stage renal disease: Secondary | ICD-10-CM | POA: Diagnosis not present

## 2014-01-05 DIAGNOSIS — Z992 Dependence on renal dialysis: Secondary | ICD-10-CM | POA: Diagnosis not present

## 2014-01-07 ENCOUNTER — Telehealth: Payer: Self-pay | Admitting: Oncology

## 2014-01-07 DIAGNOSIS — E119 Type 2 diabetes mellitus without complications: Secondary | ICD-10-CM | POA: Diagnosis not present

## 2014-01-07 DIAGNOSIS — D509 Iron deficiency anemia, unspecified: Secondary | ICD-10-CM | POA: Diagnosis not present

## 2014-01-07 DIAGNOSIS — D631 Anemia in chronic kidney disease: Secondary | ICD-10-CM | POA: Diagnosis not present

## 2014-01-07 DIAGNOSIS — N2581 Secondary hyperparathyroidism of renal origin: Secondary | ICD-10-CM | POA: Diagnosis not present

## 2014-01-07 DIAGNOSIS — N186 End stage renal disease: Secondary | ICD-10-CM | POA: Diagnosis not present

## 2014-01-07 NOTE — Telephone Encounter (Signed)
pt called to r/s appt due to transportation...done...pt ok and aware.f d.t

## 2014-01-08 ENCOUNTER — Ambulatory Visit: Payer: Medicare Other | Admitting: Oncology

## 2014-01-09 DIAGNOSIS — D631 Anemia in chronic kidney disease: Secondary | ICD-10-CM | POA: Diagnosis not present

## 2014-01-09 DIAGNOSIS — D509 Iron deficiency anemia, unspecified: Secondary | ICD-10-CM | POA: Diagnosis not present

## 2014-01-09 DIAGNOSIS — N2581 Secondary hyperparathyroidism of renal origin: Secondary | ICD-10-CM | POA: Diagnosis not present

## 2014-01-09 DIAGNOSIS — N186 End stage renal disease: Secondary | ICD-10-CM | POA: Diagnosis not present

## 2014-01-09 DIAGNOSIS — E119 Type 2 diabetes mellitus without complications: Secondary | ICD-10-CM | POA: Diagnosis not present

## 2014-01-11 DIAGNOSIS — D631 Anemia in chronic kidney disease: Secondary | ICD-10-CM | POA: Diagnosis not present

## 2014-01-11 DIAGNOSIS — E119 Type 2 diabetes mellitus without complications: Secondary | ICD-10-CM | POA: Diagnosis not present

## 2014-01-11 DIAGNOSIS — D509 Iron deficiency anemia, unspecified: Secondary | ICD-10-CM | POA: Diagnosis not present

## 2014-01-11 DIAGNOSIS — N186 End stage renal disease: Secondary | ICD-10-CM | POA: Diagnosis not present

## 2014-01-11 DIAGNOSIS — N2581 Secondary hyperparathyroidism of renal origin: Secondary | ICD-10-CM | POA: Diagnosis not present

## 2014-01-14 DIAGNOSIS — N2581 Secondary hyperparathyroidism of renal origin: Secondary | ICD-10-CM | POA: Diagnosis not present

## 2014-01-14 DIAGNOSIS — N186 End stage renal disease: Secondary | ICD-10-CM | POA: Diagnosis not present

## 2014-01-14 DIAGNOSIS — D509 Iron deficiency anemia, unspecified: Secondary | ICD-10-CM | POA: Diagnosis not present

## 2014-01-14 DIAGNOSIS — D631 Anemia in chronic kidney disease: Secondary | ICD-10-CM | POA: Diagnosis not present

## 2014-01-14 DIAGNOSIS — E119 Type 2 diabetes mellitus without complications: Secondary | ICD-10-CM | POA: Diagnosis not present

## 2014-01-16 DIAGNOSIS — D509 Iron deficiency anemia, unspecified: Secondary | ICD-10-CM | POA: Diagnosis not present

## 2014-01-16 DIAGNOSIS — N186 End stage renal disease: Secondary | ICD-10-CM | POA: Diagnosis not present

## 2014-01-16 DIAGNOSIS — N2581 Secondary hyperparathyroidism of renal origin: Secondary | ICD-10-CM | POA: Diagnosis not present

## 2014-01-16 DIAGNOSIS — E119 Type 2 diabetes mellitus without complications: Secondary | ICD-10-CM | POA: Diagnosis not present

## 2014-01-16 DIAGNOSIS — D631 Anemia in chronic kidney disease: Secondary | ICD-10-CM | POA: Diagnosis not present

## 2014-01-18 DIAGNOSIS — D631 Anemia in chronic kidney disease: Secondary | ICD-10-CM | POA: Diagnosis not present

## 2014-01-18 DIAGNOSIS — N2581 Secondary hyperparathyroidism of renal origin: Secondary | ICD-10-CM | POA: Diagnosis not present

## 2014-01-18 DIAGNOSIS — E119 Type 2 diabetes mellitus without complications: Secondary | ICD-10-CM | POA: Diagnosis not present

## 2014-01-18 DIAGNOSIS — D509 Iron deficiency anemia, unspecified: Secondary | ICD-10-CM | POA: Diagnosis not present

## 2014-01-18 DIAGNOSIS — N186 End stage renal disease: Secondary | ICD-10-CM | POA: Diagnosis not present

## 2014-01-21 DIAGNOSIS — D509 Iron deficiency anemia, unspecified: Secondary | ICD-10-CM | POA: Diagnosis not present

## 2014-01-21 DIAGNOSIS — N2581 Secondary hyperparathyroidism of renal origin: Secondary | ICD-10-CM | POA: Diagnosis not present

## 2014-01-21 DIAGNOSIS — N186 End stage renal disease: Secondary | ICD-10-CM | POA: Diagnosis not present

## 2014-01-21 DIAGNOSIS — D631 Anemia in chronic kidney disease: Secondary | ICD-10-CM | POA: Diagnosis not present

## 2014-01-21 DIAGNOSIS — E119 Type 2 diabetes mellitus without complications: Secondary | ICD-10-CM | POA: Diagnosis not present

## 2014-01-22 DIAGNOSIS — Z992 Dependence on renal dialysis: Secondary | ICD-10-CM | POA: Diagnosis not present

## 2014-01-22 DIAGNOSIS — N186 End stage renal disease: Secondary | ICD-10-CM | POA: Diagnosis not present

## 2014-01-22 DIAGNOSIS — T82858A Stenosis of vascular prosthetic devices, implants and grafts, initial encounter: Secondary | ICD-10-CM | POA: Diagnosis not present

## 2014-01-22 DIAGNOSIS — I871 Compression of vein: Secondary | ICD-10-CM | POA: Diagnosis not present

## 2014-01-23 DIAGNOSIS — N2581 Secondary hyperparathyroidism of renal origin: Secondary | ICD-10-CM | POA: Diagnosis not present

## 2014-01-23 DIAGNOSIS — D631 Anemia in chronic kidney disease: Secondary | ICD-10-CM | POA: Diagnosis not present

## 2014-01-23 DIAGNOSIS — N186 End stage renal disease: Secondary | ICD-10-CM | POA: Diagnosis not present

## 2014-01-23 DIAGNOSIS — D509 Iron deficiency anemia, unspecified: Secondary | ICD-10-CM | POA: Diagnosis not present

## 2014-01-23 DIAGNOSIS — E119 Type 2 diabetes mellitus without complications: Secondary | ICD-10-CM | POA: Diagnosis not present

## 2014-01-25 DIAGNOSIS — E119 Type 2 diabetes mellitus without complications: Secondary | ICD-10-CM | POA: Diagnosis not present

## 2014-01-25 DIAGNOSIS — D631 Anemia in chronic kidney disease: Secondary | ICD-10-CM | POA: Diagnosis not present

## 2014-01-25 DIAGNOSIS — N186 End stage renal disease: Secondary | ICD-10-CM | POA: Diagnosis not present

## 2014-01-25 DIAGNOSIS — N2581 Secondary hyperparathyroidism of renal origin: Secondary | ICD-10-CM | POA: Diagnosis not present

## 2014-01-25 DIAGNOSIS — D509 Iron deficiency anemia, unspecified: Secondary | ICD-10-CM | POA: Diagnosis not present

## 2014-01-27 DIAGNOSIS — N2581 Secondary hyperparathyroidism of renal origin: Secondary | ICD-10-CM | POA: Diagnosis not present

## 2014-01-27 DIAGNOSIS — E119 Type 2 diabetes mellitus without complications: Secondary | ICD-10-CM | POA: Diagnosis not present

## 2014-01-27 DIAGNOSIS — D509 Iron deficiency anemia, unspecified: Secondary | ICD-10-CM | POA: Diagnosis not present

## 2014-01-27 DIAGNOSIS — N186 End stage renal disease: Secondary | ICD-10-CM | POA: Diagnosis not present

## 2014-01-27 DIAGNOSIS — D631 Anemia in chronic kidney disease: Secondary | ICD-10-CM | POA: Diagnosis not present

## 2014-01-29 DIAGNOSIS — D509 Iron deficiency anemia, unspecified: Secondary | ICD-10-CM | POA: Diagnosis not present

## 2014-01-29 DIAGNOSIS — D631 Anemia in chronic kidney disease: Secondary | ICD-10-CM | POA: Diagnosis not present

## 2014-01-29 DIAGNOSIS — N2581 Secondary hyperparathyroidism of renal origin: Secondary | ICD-10-CM | POA: Diagnosis not present

## 2014-01-29 DIAGNOSIS — N186 End stage renal disease: Secondary | ICD-10-CM | POA: Diagnosis not present

## 2014-01-29 DIAGNOSIS — E119 Type 2 diabetes mellitus without complications: Secondary | ICD-10-CM | POA: Diagnosis not present

## 2014-02-01 DIAGNOSIS — N186 End stage renal disease: Secondary | ICD-10-CM | POA: Diagnosis not present

## 2014-02-01 DIAGNOSIS — N2581 Secondary hyperparathyroidism of renal origin: Secondary | ICD-10-CM | POA: Diagnosis not present

## 2014-02-01 DIAGNOSIS — D631 Anemia in chronic kidney disease: Secondary | ICD-10-CM | POA: Diagnosis not present

## 2014-02-01 DIAGNOSIS — E119 Type 2 diabetes mellitus without complications: Secondary | ICD-10-CM | POA: Diagnosis not present

## 2014-02-01 DIAGNOSIS — D509 Iron deficiency anemia, unspecified: Secondary | ICD-10-CM | POA: Diagnosis not present

## 2014-02-04 DIAGNOSIS — D631 Anemia in chronic kidney disease: Secondary | ICD-10-CM | POA: Diagnosis not present

## 2014-02-04 DIAGNOSIS — N186 End stage renal disease: Secondary | ICD-10-CM | POA: Diagnosis not present

## 2014-02-04 DIAGNOSIS — E119 Type 2 diabetes mellitus without complications: Secondary | ICD-10-CM | POA: Diagnosis not present

## 2014-02-04 DIAGNOSIS — N2581 Secondary hyperparathyroidism of renal origin: Secondary | ICD-10-CM | POA: Diagnosis not present

## 2014-02-04 DIAGNOSIS — Z992 Dependence on renal dialysis: Secondary | ICD-10-CM | POA: Diagnosis not present

## 2014-02-04 DIAGNOSIS — D509 Iron deficiency anemia, unspecified: Secondary | ICD-10-CM | POA: Diagnosis not present

## 2014-02-05 ENCOUNTER — Ambulatory Visit (HOSPITAL_BASED_OUTPATIENT_CLINIC_OR_DEPARTMENT_OTHER): Payer: Medicare Other | Admitting: Oncology

## 2014-02-05 ENCOUNTER — Telehealth: Payer: Self-pay | Admitting: Oncology

## 2014-02-05 VITALS — BP 151/58 | HR 68 | Temp 98.4°F | Resp 20 | Ht 63.0 in | Wt 108.8 lb

## 2014-02-05 DIAGNOSIS — Z992 Dependence on renal dialysis: Secondary | ICD-10-CM

## 2014-02-05 DIAGNOSIS — C50511 Malignant neoplasm of lower-outer quadrant of right female breast: Secondary | ICD-10-CM

## 2014-02-05 DIAGNOSIS — N289 Disorder of kidney and ureter, unspecified: Secondary | ICD-10-CM

## 2014-02-05 DIAGNOSIS — D649 Anemia, unspecified: Secondary | ICD-10-CM

## 2014-02-05 NOTE — Telephone Encounter (Signed)
gv adn printed appt sched and avs for pt for June 2016 °

## 2014-02-05 NOTE — Progress Notes (Signed)
Hematology and Oncology Follow Up Visit  [name] [id number] 02-Dec-1932 80 y.o. 02/05/2014 1:30 PM [name], MDSun, Heather Bailiff, MD   Principle Diagnosis: 80 year old woman with the diagnosis of invasive ductal carcinoma of the right breast diagnosed in April 2015. She presented with a 3.1 cm mass in the right lower breast. Tumor was found to be ER positive, PR negative HER-2 negative. She also has a remote history of left sided breast cancer.  Prior Therapy: She underwent a right total mastectomy under the care of Dr. Dalbert Batman on 08/07/2013. The pathology confirmed the presence of 3.5 cm invasive ductal carcinoma grade I/III with negative surgical margins. She was ER positive PR negative. With Ki-67 of 9%. The pathological staging was pT2 NX.  She is also status post left mastectomy done in 1988.  Current therapy: Arimidex 1 mg daily started in July 2015.  Interim History:  [name] presents today for a follow-up visit. Since the last visit, she was recently diagnosed with URI and currently being treated by her primary care physician. She is reporting no complications from Arimidex including arthralgias, myalgias or GI toxicities. She is not reporting any drainage or discomfort around her mastectomy site. She does not report any fevers or chills or sweats. Has not reported any weight loss or appetite changes. She does report some chronic back pain which is unchanged. She does have left arm lymphedema that is chronic. She does not report any chest pain shortness of breath cough or hemoptysis. Does not report any palpitation orthopnea or PND. Does not report any nausea or vomiting or abdominal pain. Does not report any frequency urgency or hesitancy. Does not report any worsening skeletal complaints. Does not report any lymphadenopathy or petechiae. Has not reported any other neurological symptoms of headaches or blurry vision. Rest of the review of system is unremarkable.  Medications: I have  reviewed the patient's current medications.  Current Outpatient Prescriptions  Medication Sig Dispense Refill  . anastrozole (ARIMIDEX) 1 MG tablet Take 1 tablet (1 mg total) by mouth daily. 30 tablet 6  . benzonatate (TESSALON) 100 MG capsule Take 100 mg by mouth 3 (three) times daily as needed for cough.    . cinacalcet (SENSIPAR) 30 MG tablet Take 30 mg by mouth 2 (two) times daily with a meal.     . clonazePAM (KLONOPIN) 0.5 MG tablet Take 0.5 mg by mouth 2 (two) times daily as needed for anxiety.    Marland Kitchen ibuprofen (ADVIL,MOTRIN) 200 MG tablet Take 200 mg by mouth every 6 (six) hours as needed for fever, headache or mild pain.    Marland Kitchen lanthanum (FOSRENOL) 1000 MG chewable tablet Chew 1,000 mg by mouth 3 (three) times daily with meals.     . metoprolol tartrate (LOPRESSOR) 25 MG tablet Take 25 mg by mouth 2 (two) times daily.      No current facility-administered medications for this visit.     Allergies:  Allergies  Allergen Reactions  . Aspirin     NO BLOOD THINNERS OF ANY KIND    Past Medical History, Surgical history, Social history, and Family History were reviewed and updated.  Physical Exam: Blood pressure 151/58, pulse 68, temperature 98.4 F (36.9 C), temperature source Oral, resp. rate 20, height 5' 3"  (1.6 m), weight 108 lb 12.8 oz (49.351 kg). ECOG:  General appearance: alert and cooperative Head: Normocephalic, without obvious abnormality Neck: no adenopathy Lymph nodes: Cervical, supraclavicular, and axillary nodes normal. Heart:regular rate and rhythm, S1, S2 normal, no  murmur, click, rub or gallop Lung:chest clear, no wheezing, rales, normal symmetric air entry Abdomin: soft, non-tender, without masses or organomegaly EXT:no erythema, induration, or nodules   Lab Results: Lab Results  Component Value Date   WBC 4.3 08/13/2013   HGB 9.0* 08/13/2013   HCT 26.5* 08/13/2013   MCV 87.2 08/13/2013   PLT 202 08/13/2013     Chemistry      Component Value  Date/Time   NA 139 09/04/2013 1025   NA 132* 08/13/2013 0721   K 4.0 09/04/2013 1025   K 4.7 08/13/2013 0721   CL 93* 08/13/2013 0721   CO2 31* 09/04/2013 1025   CO2 27 08/13/2013 0721   BUN 49.9* 09/04/2013 1025   BUN 41* 08/13/2013 0721   CREATININE 6.2* 09/04/2013 1025   CREATININE 6.23* 08/13/2013 0721      Component Value Date/Time   CALCIUM 10.4 09/04/2013 1025   CALCIUM 7.4* 08/13/2013 0721   ALKPHOS 152* 09/04/2013 1025   ALKPHOS 100 07/26/2013 0938   AST 43* 09/04/2013 1025   AST 40* 07/26/2013 0938   ALT 21 09/04/2013 1025   ALT 26 07/26/2013 0938   BILITOT 0.54 09/04/2013 1025   BILITOT 0.5 07/26/2013 0938       Impression and Plan:   80 year old woman with the following issues:  1. An invasive ductal carcinoma of the right breast after presenting with 3.5 cm mass. She is status post right mastectomy with the pathological staging of T2 Nx. Her clinical staging is T2 N0 with clinically negative axilla. Her tumor is ER positive HER-2 negative. With low grade features. She is status post left mastectomy for a breast cancer in 1988 and that time she was treated with tamoxifen.  She is currently on Arimidex and tolerated it well. The plan to treat her with at least 5 years.  2. Renal insufficiency: Is currently hemodialysis-dependent.  3. Anemia: Related to her kidney disease.  HDTPNS,QZYTM, MD 12/1/20151:30 PM

## 2014-02-05 NOTE — Addendum Note (Signed)
Addended by: Randolm Idol on: 02/05/2014 02:19 PM   Modules accepted: Orders, Medications

## 2014-02-06 DIAGNOSIS — D509 Iron deficiency anemia, unspecified: Secondary | ICD-10-CM | POA: Diagnosis not present

## 2014-02-06 DIAGNOSIS — E119 Type 2 diabetes mellitus without complications: Secondary | ICD-10-CM | POA: Diagnosis not present

## 2014-02-06 DIAGNOSIS — N186 End stage renal disease: Secondary | ICD-10-CM | POA: Diagnosis not present

## 2014-02-06 DIAGNOSIS — N2581 Secondary hyperparathyroidism of renal origin: Secondary | ICD-10-CM | POA: Diagnosis not present

## 2014-02-06 DIAGNOSIS — D631 Anemia in chronic kidney disease: Secondary | ICD-10-CM | POA: Diagnosis not present

## 2014-03-07 DIAGNOSIS — Z992 Dependence on renal dialysis: Secondary | ICD-10-CM | POA: Diagnosis not present

## 2014-03-07 DIAGNOSIS — N186 End stage renal disease: Secondary | ICD-10-CM | POA: Diagnosis not present

## 2014-03-09 DIAGNOSIS — D509 Iron deficiency anemia, unspecified: Secondary | ICD-10-CM | POA: Diagnosis not present

## 2014-03-09 DIAGNOSIS — N2581 Secondary hyperparathyroidism of renal origin: Secondary | ICD-10-CM | POA: Diagnosis not present

## 2014-03-09 DIAGNOSIS — E119 Type 2 diabetes mellitus without complications: Secondary | ICD-10-CM | POA: Diagnosis not present

## 2014-03-09 DIAGNOSIS — D631 Anemia in chronic kidney disease: Secondary | ICD-10-CM | POA: Diagnosis not present

## 2014-03-09 DIAGNOSIS — N186 End stage renal disease: Secondary | ICD-10-CM | POA: Diagnosis not present

## 2014-03-11 DIAGNOSIS — D631 Anemia in chronic kidney disease: Secondary | ICD-10-CM | POA: Diagnosis not present

## 2014-03-11 DIAGNOSIS — N2581 Secondary hyperparathyroidism of renal origin: Secondary | ICD-10-CM | POA: Diagnosis not present

## 2014-03-11 DIAGNOSIS — D509 Iron deficiency anemia, unspecified: Secondary | ICD-10-CM | POA: Diagnosis not present

## 2014-03-11 DIAGNOSIS — E119 Type 2 diabetes mellitus without complications: Secondary | ICD-10-CM | POA: Diagnosis not present

## 2014-03-11 DIAGNOSIS — N186 End stage renal disease: Secondary | ICD-10-CM | POA: Diagnosis not present

## 2014-03-13 DIAGNOSIS — E119 Type 2 diabetes mellitus without complications: Secondary | ICD-10-CM | POA: Diagnosis not present

## 2014-03-13 DIAGNOSIS — N186 End stage renal disease: Secondary | ICD-10-CM | POA: Diagnosis not present

## 2014-03-13 DIAGNOSIS — N2581 Secondary hyperparathyroidism of renal origin: Secondary | ICD-10-CM | POA: Diagnosis not present

## 2014-03-13 DIAGNOSIS — D509 Iron deficiency anemia, unspecified: Secondary | ICD-10-CM | POA: Diagnosis not present

## 2014-03-13 DIAGNOSIS — D631 Anemia in chronic kidney disease: Secondary | ICD-10-CM | POA: Diagnosis not present

## 2014-03-15 DIAGNOSIS — N186 End stage renal disease: Secondary | ICD-10-CM | POA: Diagnosis not present

## 2014-03-15 DIAGNOSIS — E119 Type 2 diabetes mellitus without complications: Secondary | ICD-10-CM | POA: Diagnosis not present

## 2014-03-15 DIAGNOSIS — N2581 Secondary hyperparathyroidism of renal origin: Secondary | ICD-10-CM | POA: Diagnosis not present

## 2014-03-15 DIAGNOSIS — D631 Anemia in chronic kidney disease: Secondary | ICD-10-CM | POA: Diagnosis not present

## 2014-03-15 DIAGNOSIS — D509 Iron deficiency anemia, unspecified: Secondary | ICD-10-CM | POA: Diagnosis not present

## 2014-03-18 DIAGNOSIS — N186 End stage renal disease: Secondary | ICD-10-CM | POA: Diagnosis not present

## 2014-03-18 DIAGNOSIS — D631 Anemia in chronic kidney disease: Secondary | ICD-10-CM | POA: Diagnosis not present

## 2014-03-18 DIAGNOSIS — N2581 Secondary hyperparathyroidism of renal origin: Secondary | ICD-10-CM | POA: Diagnosis not present

## 2014-03-18 DIAGNOSIS — D509 Iron deficiency anemia, unspecified: Secondary | ICD-10-CM | POA: Diagnosis not present

## 2014-03-18 DIAGNOSIS — E119 Type 2 diabetes mellitus without complications: Secondary | ICD-10-CM | POA: Diagnosis not present

## 2014-03-20 DIAGNOSIS — D631 Anemia in chronic kidney disease: Secondary | ICD-10-CM | POA: Diagnosis not present

## 2014-03-20 DIAGNOSIS — D509 Iron deficiency anemia, unspecified: Secondary | ICD-10-CM | POA: Diagnosis not present

## 2014-03-20 DIAGNOSIS — N186 End stage renal disease: Secondary | ICD-10-CM | POA: Diagnosis not present

## 2014-03-20 DIAGNOSIS — N2581 Secondary hyperparathyroidism of renal origin: Secondary | ICD-10-CM | POA: Diagnosis not present

## 2014-03-20 DIAGNOSIS — E119 Type 2 diabetes mellitus without complications: Secondary | ICD-10-CM | POA: Diagnosis not present

## 2014-03-22 DIAGNOSIS — D509 Iron deficiency anemia, unspecified: Secondary | ICD-10-CM | POA: Diagnosis not present

## 2014-03-22 DIAGNOSIS — D631 Anemia in chronic kidney disease: Secondary | ICD-10-CM | POA: Diagnosis not present

## 2014-03-22 DIAGNOSIS — E119 Type 2 diabetes mellitus without complications: Secondary | ICD-10-CM | POA: Diagnosis not present

## 2014-03-22 DIAGNOSIS — N2581 Secondary hyperparathyroidism of renal origin: Secondary | ICD-10-CM | POA: Diagnosis not present

## 2014-03-22 DIAGNOSIS — N186 End stage renal disease: Secondary | ICD-10-CM | POA: Diagnosis not present

## 2014-03-25 DIAGNOSIS — N2581 Secondary hyperparathyroidism of renal origin: Secondary | ICD-10-CM | POA: Diagnosis not present

## 2014-03-25 DIAGNOSIS — D509 Iron deficiency anemia, unspecified: Secondary | ICD-10-CM | POA: Diagnosis not present

## 2014-03-25 DIAGNOSIS — D631 Anemia in chronic kidney disease: Secondary | ICD-10-CM | POA: Diagnosis not present

## 2014-03-25 DIAGNOSIS — E119 Type 2 diabetes mellitus without complications: Secondary | ICD-10-CM | POA: Diagnosis not present

## 2014-03-25 DIAGNOSIS — N186 End stage renal disease: Secondary | ICD-10-CM | POA: Diagnosis not present

## 2014-03-27 DIAGNOSIS — D509 Iron deficiency anemia, unspecified: Secondary | ICD-10-CM | POA: Diagnosis not present

## 2014-03-27 DIAGNOSIS — D631 Anemia in chronic kidney disease: Secondary | ICD-10-CM | POA: Diagnosis not present

## 2014-03-27 DIAGNOSIS — N2581 Secondary hyperparathyroidism of renal origin: Secondary | ICD-10-CM | POA: Diagnosis not present

## 2014-03-27 DIAGNOSIS — E119 Type 2 diabetes mellitus without complications: Secondary | ICD-10-CM | POA: Diagnosis not present

## 2014-03-27 DIAGNOSIS — N186 End stage renal disease: Secondary | ICD-10-CM | POA: Diagnosis not present

## 2014-03-29 DIAGNOSIS — D509 Iron deficiency anemia, unspecified: Secondary | ICD-10-CM | POA: Diagnosis not present

## 2014-03-29 DIAGNOSIS — D631 Anemia in chronic kidney disease: Secondary | ICD-10-CM | POA: Diagnosis not present

## 2014-03-29 DIAGNOSIS — N186 End stage renal disease: Secondary | ICD-10-CM | POA: Diagnosis not present

## 2014-03-29 DIAGNOSIS — N2581 Secondary hyperparathyroidism of renal origin: Secondary | ICD-10-CM | POA: Diagnosis not present

## 2014-03-29 DIAGNOSIS — E119 Type 2 diabetes mellitus without complications: Secondary | ICD-10-CM | POA: Diagnosis not present

## 2014-04-01 DIAGNOSIS — D631 Anemia in chronic kidney disease: Secondary | ICD-10-CM | POA: Diagnosis not present

## 2014-04-01 DIAGNOSIS — D509 Iron deficiency anemia, unspecified: Secondary | ICD-10-CM | POA: Diagnosis not present

## 2014-04-01 DIAGNOSIS — N2581 Secondary hyperparathyroidism of renal origin: Secondary | ICD-10-CM | POA: Diagnosis not present

## 2014-04-01 DIAGNOSIS — E119 Type 2 diabetes mellitus without complications: Secondary | ICD-10-CM | POA: Diagnosis not present

## 2014-04-01 DIAGNOSIS — N186 End stage renal disease: Secondary | ICD-10-CM | POA: Diagnosis not present

## 2014-04-03 DIAGNOSIS — E119 Type 2 diabetes mellitus without complications: Secondary | ICD-10-CM | POA: Diagnosis not present

## 2014-04-03 DIAGNOSIS — E1129 Type 2 diabetes mellitus with other diabetic kidney complication: Secondary | ICD-10-CM | POA: Diagnosis not present

## 2014-04-03 DIAGNOSIS — D509 Iron deficiency anemia, unspecified: Secondary | ICD-10-CM | POA: Diagnosis not present

## 2014-04-03 DIAGNOSIS — N2581 Secondary hyperparathyroidism of renal origin: Secondary | ICD-10-CM | POA: Diagnosis not present

## 2014-04-03 DIAGNOSIS — D631 Anemia in chronic kidney disease: Secondary | ICD-10-CM | POA: Diagnosis not present

## 2014-04-03 DIAGNOSIS — N186 End stage renal disease: Secondary | ICD-10-CM | POA: Diagnosis not present

## 2014-04-05 DIAGNOSIS — N186 End stage renal disease: Secondary | ICD-10-CM | POA: Diagnosis not present

## 2014-04-05 DIAGNOSIS — D631 Anemia in chronic kidney disease: Secondary | ICD-10-CM | POA: Diagnosis not present

## 2014-04-05 DIAGNOSIS — D509 Iron deficiency anemia, unspecified: Secondary | ICD-10-CM | POA: Diagnosis not present

## 2014-04-05 DIAGNOSIS — E119 Type 2 diabetes mellitus without complications: Secondary | ICD-10-CM | POA: Diagnosis not present

## 2014-04-05 DIAGNOSIS — N2581 Secondary hyperparathyroidism of renal origin: Secondary | ICD-10-CM | POA: Diagnosis not present

## 2014-04-07 DIAGNOSIS — N186 End stage renal disease: Secondary | ICD-10-CM | POA: Diagnosis not present

## 2014-04-07 DIAGNOSIS — Z992 Dependence on renal dialysis: Secondary | ICD-10-CM | POA: Diagnosis not present

## 2014-04-08 DIAGNOSIS — D509 Iron deficiency anemia, unspecified: Secondary | ICD-10-CM | POA: Diagnosis not present

## 2014-04-08 DIAGNOSIS — N186 End stage renal disease: Secondary | ICD-10-CM | POA: Diagnosis not present

## 2014-04-08 DIAGNOSIS — D631 Anemia in chronic kidney disease: Secondary | ICD-10-CM | POA: Diagnosis not present

## 2014-04-08 DIAGNOSIS — E119 Type 2 diabetes mellitus without complications: Secondary | ICD-10-CM | POA: Diagnosis not present

## 2014-04-08 DIAGNOSIS — N2581 Secondary hyperparathyroidism of renal origin: Secondary | ICD-10-CM | POA: Diagnosis not present

## 2014-04-10 DIAGNOSIS — N186 End stage renal disease: Secondary | ICD-10-CM | POA: Diagnosis not present

## 2014-04-10 DIAGNOSIS — D631 Anemia in chronic kidney disease: Secondary | ICD-10-CM | POA: Diagnosis not present

## 2014-04-10 DIAGNOSIS — E119 Type 2 diabetes mellitus without complications: Secondary | ICD-10-CM | POA: Diagnosis not present

## 2014-04-10 DIAGNOSIS — N2581 Secondary hyperparathyroidism of renal origin: Secondary | ICD-10-CM | POA: Diagnosis not present

## 2014-04-10 DIAGNOSIS — D509 Iron deficiency anemia, unspecified: Secondary | ICD-10-CM | POA: Diagnosis not present

## 2014-04-12 DIAGNOSIS — D509 Iron deficiency anemia, unspecified: Secondary | ICD-10-CM | POA: Diagnosis not present

## 2014-04-12 DIAGNOSIS — E119 Type 2 diabetes mellitus without complications: Secondary | ICD-10-CM | POA: Diagnosis not present

## 2014-04-12 DIAGNOSIS — N186 End stage renal disease: Secondary | ICD-10-CM | POA: Diagnosis not present

## 2014-04-12 DIAGNOSIS — D631 Anemia in chronic kidney disease: Secondary | ICD-10-CM | POA: Diagnosis not present

## 2014-04-12 DIAGNOSIS — N2581 Secondary hyperparathyroidism of renal origin: Secondary | ICD-10-CM | POA: Diagnosis not present

## 2014-04-15 DIAGNOSIS — D509 Iron deficiency anemia, unspecified: Secondary | ICD-10-CM | POA: Diagnosis not present

## 2014-04-15 DIAGNOSIS — D631 Anemia in chronic kidney disease: Secondary | ICD-10-CM | POA: Diagnosis not present

## 2014-04-15 DIAGNOSIS — N2581 Secondary hyperparathyroidism of renal origin: Secondary | ICD-10-CM | POA: Diagnosis not present

## 2014-04-15 DIAGNOSIS — E119 Type 2 diabetes mellitus without complications: Secondary | ICD-10-CM | POA: Diagnosis not present

## 2014-04-15 DIAGNOSIS — N186 End stage renal disease: Secondary | ICD-10-CM | POA: Diagnosis not present

## 2014-04-17 DIAGNOSIS — D631 Anemia in chronic kidney disease: Secondary | ICD-10-CM | POA: Diagnosis not present

## 2014-04-17 DIAGNOSIS — N2581 Secondary hyperparathyroidism of renal origin: Secondary | ICD-10-CM | POA: Diagnosis not present

## 2014-04-17 DIAGNOSIS — N186 End stage renal disease: Secondary | ICD-10-CM | POA: Diagnosis not present

## 2014-04-17 DIAGNOSIS — E119 Type 2 diabetes mellitus without complications: Secondary | ICD-10-CM | POA: Diagnosis not present

## 2014-04-17 DIAGNOSIS — D509 Iron deficiency anemia, unspecified: Secondary | ICD-10-CM | POA: Diagnosis not present

## 2014-04-19 DIAGNOSIS — D631 Anemia in chronic kidney disease: Secondary | ICD-10-CM | POA: Diagnosis not present

## 2014-04-19 DIAGNOSIS — D509 Iron deficiency anemia, unspecified: Secondary | ICD-10-CM | POA: Diagnosis not present

## 2014-04-19 DIAGNOSIS — N186 End stage renal disease: Secondary | ICD-10-CM | POA: Diagnosis not present

## 2014-04-19 DIAGNOSIS — E119 Type 2 diabetes mellitus without complications: Secondary | ICD-10-CM | POA: Diagnosis not present

## 2014-04-19 DIAGNOSIS — N2581 Secondary hyperparathyroidism of renal origin: Secondary | ICD-10-CM | POA: Diagnosis not present

## 2014-04-22 DIAGNOSIS — E119 Type 2 diabetes mellitus without complications: Secondary | ICD-10-CM | POA: Diagnosis not present

## 2014-04-22 DIAGNOSIS — N2581 Secondary hyperparathyroidism of renal origin: Secondary | ICD-10-CM | POA: Diagnosis not present

## 2014-04-22 DIAGNOSIS — N186 End stage renal disease: Secondary | ICD-10-CM | POA: Diagnosis not present

## 2014-04-22 DIAGNOSIS — D631 Anemia in chronic kidney disease: Secondary | ICD-10-CM | POA: Diagnosis not present

## 2014-04-22 DIAGNOSIS — D509 Iron deficiency anemia, unspecified: Secondary | ICD-10-CM | POA: Diagnosis not present

## 2014-04-24 DIAGNOSIS — N186 End stage renal disease: Secondary | ICD-10-CM | POA: Diagnosis not present

## 2014-04-24 DIAGNOSIS — D509 Iron deficiency anemia, unspecified: Secondary | ICD-10-CM | POA: Diagnosis not present

## 2014-04-24 DIAGNOSIS — D631 Anemia in chronic kidney disease: Secondary | ICD-10-CM | POA: Diagnosis not present

## 2014-04-24 DIAGNOSIS — E119 Type 2 diabetes mellitus without complications: Secondary | ICD-10-CM | POA: Diagnosis not present

## 2014-04-24 DIAGNOSIS — N2581 Secondary hyperparathyroidism of renal origin: Secondary | ICD-10-CM | POA: Diagnosis not present

## 2014-04-25 DIAGNOSIS — Z992 Dependence on renal dialysis: Secondary | ICD-10-CM | POA: Diagnosis not present

## 2014-04-25 DIAGNOSIS — N186 End stage renal disease: Secondary | ICD-10-CM | POA: Diagnosis not present

## 2014-04-25 DIAGNOSIS — T82858D Stenosis of vascular prosthetic devices, implants and grafts, subsequent encounter: Secondary | ICD-10-CM | POA: Diagnosis not present

## 2014-04-25 DIAGNOSIS — I871 Compression of vein: Secondary | ICD-10-CM | POA: Diagnosis not present

## 2014-04-26 DIAGNOSIS — D631 Anemia in chronic kidney disease: Secondary | ICD-10-CM | POA: Diagnosis not present

## 2014-04-26 DIAGNOSIS — D509 Iron deficiency anemia, unspecified: Secondary | ICD-10-CM | POA: Diagnosis not present

## 2014-04-26 DIAGNOSIS — N2581 Secondary hyperparathyroidism of renal origin: Secondary | ICD-10-CM | POA: Diagnosis not present

## 2014-04-26 DIAGNOSIS — E119 Type 2 diabetes mellitus without complications: Secondary | ICD-10-CM | POA: Diagnosis not present

## 2014-04-26 DIAGNOSIS — N186 End stage renal disease: Secondary | ICD-10-CM | POA: Diagnosis not present

## 2014-04-29 DIAGNOSIS — N186 End stage renal disease: Secondary | ICD-10-CM | POA: Diagnosis not present

## 2014-04-29 DIAGNOSIS — E119 Type 2 diabetes mellitus without complications: Secondary | ICD-10-CM | POA: Diagnosis not present

## 2014-04-29 DIAGNOSIS — D509 Iron deficiency anemia, unspecified: Secondary | ICD-10-CM | POA: Diagnosis not present

## 2014-04-29 DIAGNOSIS — N2581 Secondary hyperparathyroidism of renal origin: Secondary | ICD-10-CM | POA: Diagnosis not present

## 2014-04-29 DIAGNOSIS — D631 Anemia in chronic kidney disease: Secondary | ICD-10-CM | POA: Diagnosis not present

## 2014-05-01 DIAGNOSIS — N2581 Secondary hyperparathyroidism of renal origin: Secondary | ICD-10-CM | POA: Diagnosis not present

## 2014-05-01 DIAGNOSIS — E119 Type 2 diabetes mellitus without complications: Secondary | ICD-10-CM | POA: Diagnosis not present

## 2014-05-01 DIAGNOSIS — D509 Iron deficiency anemia, unspecified: Secondary | ICD-10-CM | POA: Diagnosis not present

## 2014-05-01 DIAGNOSIS — N186 End stage renal disease: Secondary | ICD-10-CM | POA: Diagnosis not present

## 2014-05-01 DIAGNOSIS — D631 Anemia in chronic kidney disease: Secondary | ICD-10-CM | POA: Diagnosis not present

## 2014-05-03 DIAGNOSIS — N186 End stage renal disease: Secondary | ICD-10-CM | POA: Diagnosis not present

## 2014-05-03 DIAGNOSIS — N2581 Secondary hyperparathyroidism of renal origin: Secondary | ICD-10-CM | POA: Diagnosis not present

## 2014-05-03 DIAGNOSIS — E119 Type 2 diabetes mellitus without complications: Secondary | ICD-10-CM | POA: Diagnosis not present

## 2014-05-03 DIAGNOSIS — D631 Anemia in chronic kidney disease: Secondary | ICD-10-CM | POA: Diagnosis not present

## 2014-05-03 DIAGNOSIS — D509 Iron deficiency anemia, unspecified: Secondary | ICD-10-CM | POA: Diagnosis not present

## 2014-05-06 DIAGNOSIS — D631 Anemia in chronic kidney disease: Secondary | ICD-10-CM | POA: Diagnosis not present

## 2014-05-06 DIAGNOSIS — N186 End stage renal disease: Secondary | ICD-10-CM | POA: Diagnosis not present

## 2014-05-06 DIAGNOSIS — D509 Iron deficiency anemia, unspecified: Secondary | ICD-10-CM | POA: Diagnosis not present

## 2014-05-06 DIAGNOSIS — E119 Type 2 diabetes mellitus without complications: Secondary | ICD-10-CM | POA: Diagnosis not present

## 2014-05-06 DIAGNOSIS — N2581 Secondary hyperparathyroidism of renal origin: Secondary | ICD-10-CM | POA: Diagnosis not present

## 2014-05-06 DIAGNOSIS — Z992 Dependence on renal dialysis: Secondary | ICD-10-CM | POA: Diagnosis not present

## 2014-05-08 DIAGNOSIS — D509 Iron deficiency anemia, unspecified: Secondary | ICD-10-CM | POA: Diagnosis not present

## 2014-05-08 DIAGNOSIS — N2581 Secondary hyperparathyroidism of renal origin: Secondary | ICD-10-CM | POA: Diagnosis not present

## 2014-05-08 DIAGNOSIS — E119 Type 2 diabetes mellitus without complications: Secondary | ICD-10-CM | POA: Diagnosis not present

## 2014-05-08 DIAGNOSIS — D631 Anemia in chronic kidney disease: Secondary | ICD-10-CM | POA: Diagnosis not present

## 2014-05-08 DIAGNOSIS — N186 End stage renal disease: Secondary | ICD-10-CM | POA: Diagnosis not present

## 2014-05-10 DIAGNOSIS — D631 Anemia in chronic kidney disease: Secondary | ICD-10-CM | POA: Diagnosis not present

## 2014-05-10 DIAGNOSIS — E119 Type 2 diabetes mellitus without complications: Secondary | ICD-10-CM | POA: Diagnosis not present

## 2014-05-10 DIAGNOSIS — N2581 Secondary hyperparathyroidism of renal origin: Secondary | ICD-10-CM | POA: Diagnosis not present

## 2014-05-10 DIAGNOSIS — D509 Iron deficiency anemia, unspecified: Secondary | ICD-10-CM | POA: Diagnosis not present

## 2014-05-10 DIAGNOSIS — N186 End stage renal disease: Secondary | ICD-10-CM | POA: Diagnosis not present

## 2014-05-13 DIAGNOSIS — D631 Anemia in chronic kidney disease: Secondary | ICD-10-CM | POA: Diagnosis not present

## 2014-05-13 DIAGNOSIS — N2581 Secondary hyperparathyroidism of renal origin: Secondary | ICD-10-CM | POA: Diagnosis not present

## 2014-05-13 DIAGNOSIS — N186 End stage renal disease: Secondary | ICD-10-CM | POA: Diagnosis not present

## 2014-05-13 DIAGNOSIS — D509 Iron deficiency anemia, unspecified: Secondary | ICD-10-CM | POA: Diagnosis not present

## 2014-05-13 DIAGNOSIS — E119 Type 2 diabetes mellitus without complications: Secondary | ICD-10-CM | POA: Diagnosis not present

## 2014-05-15 ENCOUNTER — Telehealth: Payer: Self-pay | Admitting: Oncology

## 2014-05-15 DIAGNOSIS — D631 Anemia in chronic kidney disease: Secondary | ICD-10-CM | POA: Diagnosis not present

## 2014-05-15 DIAGNOSIS — E119 Type 2 diabetes mellitus without complications: Secondary | ICD-10-CM | POA: Diagnosis not present

## 2014-05-15 DIAGNOSIS — D509 Iron deficiency anemia, unspecified: Secondary | ICD-10-CM | POA: Diagnosis not present

## 2014-05-15 DIAGNOSIS — N186 End stage renal disease: Secondary | ICD-10-CM | POA: Diagnosis not present

## 2014-05-15 DIAGNOSIS — N2581 Secondary hyperparathyroidism of renal origin: Secondary | ICD-10-CM | POA: Diagnosis not present

## 2014-05-15 NOTE — Telephone Encounter (Signed)
Lft msg for pt confirming MD visit r/s per provider schedule, mailed schedule to pt.... KJ

## 2014-05-17 DIAGNOSIS — D631 Anemia in chronic kidney disease: Secondary | ICD-10-CM | POA: Diagnosis not present

## 2014-05-17 DIAGNOSIS — N2581 Secondary hyperparathyroidism of renal origin: Secondary | ICD-10-CM | POA: Diagnosis not present

## 2014-05-17 DIAGNOSIS — E119 Type 2 diabetes mellitus without complications: Secondary | ICD-10-CM | POA: Diagnosis not present

## 2014-05-17 DIAGNOSIS — D509 Iron deficiency anemia, unspecified: Secondary | ICD-10-CM | POA: Diagnosis not present

## 2014-05-17 DIAGNOSIS — N186 End stage renal disease: Secondary | ICD-10-CM | POA: Diagnosis not present

## 2014-05-20 DIAGNOSIS — D509 Iron deficiency anemia, unspecified: Secondary | ICD-10-CM | POA: Diagnosis not present

## 2014-05-20 DIAGNOSIS — D631 Anemia in chronic kidney disease: Secondary | ICD-10-CM | POA: Diagnosis not present

## 2014-05-20 DIAGNOSIS — N186 End stage renal disease: Secondary | ICD-10-CM | POA: Diagnosis not present

## 2014-05-20 DIAGNOSIS — N2581 Secondary hyperparathyroidism of renal origin: Secondary | ICD-10-CM | POA: Diagnosis not present

## 2014-05-20 DIAGNOSIS — E119 Type 2 diabetes mellitus without complications: Secondary | ICD-10-CM | POA: Diagnosis not present

## 2014-05-22 DIAGNOSIS — E119 Type 2 diabetes mellitus without complications: Secondary | ICD-10-CM | POA: Diagnosis not present

## 2014-05-22 DIAGNOSIS — D509 Iron deficiency anemia, unspecified: Secondary | ICD-10-CM | POA: Diagnosis not present

## 2014-05-22 DIAGNOSIS — D631 Anemia in chronic kidney disease: Secondary | ICD-10-CM | POA: Diagnosis not present

## 2014-05-22 DIAGNOSIS — N2581 Secondary hyperparathyroidism of renal origin: Secondary | ICD-10-CM | POA: Diagnosis not present

## 2014-05-22 DIAGNOSIS — N186 End stage renal disease: Secondary | ICD-10-CM | POA: Diagnosis not present

## 2014-05-24 DIAGNOSIS — E119 Type 2 diabetes mellitus without complications: Secondary | ICD-10-CM | POA: Diagnosis not present

## 2014-05-24 DIAGNOSIS — N186 End stage renal disease: Secondary | ICD-10-CM | POA: Diagnosis not present

## 2014-05-24 DIAGNOSIS — D509 Iron deficiency anemia, unspecified: Secondary | ICD-10-CM | POA: Diagnosis not present

## 2014-05-24 DIAGNOSIS — D631 Anemia in chronic kidney disease: Secondary | ICD-10-CM | POA: Diagnosis not present

## 2014-05-24 DIAGNOSIS — N2581 Secondary hyperparathyroidism of renal origin: Secondary | ICD-10-CM | POA: Diagnosis not present

## 2014-05-27 DIAGNOSIS — D631 Anemia in chronic kidney disease: Secondary | ICD-10-CM | POA: Diagnosis not present

## 2014-05-27 DIAGNOSIS — E119 Type 2 diabetes mellitus without complications: Secondary | ICD-10-CM | POA: Diagnosis not present

## 2014-05-27 DIAGNOSIS — D509 Iron deficiency anemia, unspecified: Secondary | ICD-10-CM | POA: Diagnosis not present

## 2014-05-27 DIAGNOSIS — N2581 Secondary hyperparathyroidism of renal origin: Secondary | ICD-10-CM | POA: Diagnosis not present

## 2014-05-27 DIAGNOSIS — N186 End stage renal disease: Secondary | ICD-10-CM | POA: Diagnosis not present

## 2014-05-29 DIAGNOSIS — N186 End stage renal disease: Secondary | ICD-10-CM | POA: Diagnosis not present

## 2014-05-29 DIAGNOSIS — D631 Anemia in chronic kidney disease: Secondary | ICD-10-CM | POA: Diagnosis not present

## 2014-05-29 DIAGNOSIS — D509 Iron deficiency anemia, unspecified: Secondary | ICD-10-CM | POA: Diagnosis not present

## 2014-05-29 DIAGNOSIS — N2581 Secondary hyperparathyroidism of renal origin: Secondary | ICD-10-CM | POA: Diagnosis not present

## 2014-05-29 DIAGNOSIS — E119 Type 2 diabetes mellitus without complications: Secondary | ICD-10-CM | POA: Diagnosis not present

## 2014-05-31 DIAGNOSIS — D631 Anemia in chronic kidney disease: Secondary | ICD-10-CM | POA: Diagnosis not present

## 2014-05-31 DIAGNOSIS — N186 End stage renal disease: Secondary | ICD-10-CM | POA: Diagnosis not present

## 2014-05-31 DIAGNOSIS — E119 Type 2 diabetes mellitus without complications: Secondary | ICD-10-CM | POA: Diagnosis not present

## 2014-05-31 DIAGNOSIS — N2581 Secondary hyperparathyroidism of renal origin: Secondary | ICD-10-CM | POA: Diagnosis not present

## 2014-05-31 DIAGNOSIS — D509 Iron deficiency anemia, unspecified: Secondary | ICD-10-CM | POA: Diagnosis not present

## 2014-06-03 DIAGNOSIS — E119 Type 2 diabetes mellitus without complications: Secondary | ICD-10-CM | POA: Diagnosis not present

## 2014-06-03 DIAGNOSIS — D631 Anemia in chronic kidney disease: Secondary | ICD-10-CM | POA: Diagnosis not present

## 2014-06-03 DIAGNOSIS — N186 End stage renal disease: Secondary | ICD-10-CM | POA: Diagnosis not present

## 2014-06-03 DIAGNOSIS — N2581 Secondary hyperparathyroidism of renal origin: Secondary | ICD-10-CM | POA: Diagnosis not present

## 2014-06-03 DIAGNOSIS — D509 Iron deficiency anemia, unspecified: Secondary | ICD-10-CM | POA: Diagnosis not present

## 2014-06-05 DIAGNOSIS — D509 Iron deficiency anemia, unspecified: Secondary | ICD-10-CM | POA: Diagnosis not present

## 2014-06-05 DIAGNOSIS — N2581 Secondary hyperparathyroidism of renal origin: Secondary | ICD-10-CM | POA: Diagnosis not present

## 2014-06-05 DIAGNOSIS — N186 End stage renal disease: Secondary | ICD-10-CM | POA: Diagnosis not present

## 2014-06-05 DIAGNOSIS — E119 Type 2 diabetes mellitus without complications: Secondary | ICD-10-CM | POA: Diagnosis not present

## 2014-06-05 DIAGNOSIS — D631 Anemia in chronic kidney disease: Secondary | ICD-10-CM | POA: Diagnosis not present

## 2014-06-06 DIAGNOSIS — N186 End stage renal disease: Secondary | ICD-10-CM | POA: Diagnosis not present

## 2014-06-06 DIAGNOSIS — E1129 Type 2 diabetes mellitus with other diabetic kidney complication: Secondary | ICD-10-CM | POA: Diagnosis not present

## 2014-06-06 DIAGNOSIS — Z992 Dependence on renal dialysis: Secondary | ICD-10-CM | POA: Diagnosis not present

## 2014-06-07 DIAGNOSIS — E119 Type 2 diabetes mellitus without complications: Secondary | ICD-10-CM | POA: Diagnosis not present

## 2014-06-07 DIAGNOSIS — D631 Anemia in chronic kidney disease: Secondary | ICD-10-CM | POA: Diagnosis not present

## 2014-06-07 DIAGNOSIS — N2581 Secondary hyperparathyroidism of renal origin: Secondary | ICD-10-CM | POA: Diagnosis not present

## 2014-06-07 DIAGNOSIS — D509 Iron deficiency anemia, unspecified: Secondary | ICD-10-CM | POA: Diagnosis not present

## 2014-06-07 DIAGNOSIS — N186 End stage renal disease: Secondary | ICD-10-CM | POA: Diagnosis not present

## 2014-06-10 DIAGNOSIS — D631 Anemia in chronic kidney disease: Secondary | ICD-10-CM | POA: Diagnosis not present

## 2014-06-10 DIAGNOSIS — N2581 Secondary hyperparathyroidism of renal origin: Secondary | ICD-10-CM | POA: Diagnosis not present

## 2014-06-10 DIAGNOSIS — E119 Type 2 diabetes mellitus without complications: Secondary | ICD-10-CM | POA: Diagnosis not present

## 2014-06-10 DIAGNOSIS — D509 Iron deficiency anemia, unspecified: Secondary | ICD-10-CM | POA: Diagnosis not present

## 2014-06-10 DIAGNOSIS — N186 End stage renal disease: Secondary | ICD-10-CM | POA: Diagnosis not present

## 2014-06-12 DIAGNOSIS — E119 Type 2 diabetes mellitus without complications: Secondary | ICD-10-CM | POA: Diagnosis not present

## 2014-06-12 DIAGNOSIS — D631 Anemia in chronic kidney disease: Secondary | ICD-10-CM | POA: Diagnosis not present

## 2014-06-12 DIAGNOSIS — N186 End stage renal disease: Secondary | ICD-10-CM | POA: Diagnosis not present

## 2014-06-12 DIAGNOSIS — D509 Iron deficiency anemia, unspecified: Secondary | ICD-10-CM | POA: Diagnosis not present

## 2014-06-12 DIAGNOSIS — N2581 Secondary hyperparathyroidism of renal origin: Secondary | ICD-10-CM | POA: Diagnosis not present

## 2014-06-14 DIAGNOSIS — N186 End stage renal disease: Secondary | ICD-10-CM | POA: Diagnosis not present

## 2014-06-14 DIAGNOSIS — D631 Anemia in chronic kidney disease: Secondary | ICD-10-CM | POA: Diagnosis not present

## 2014-06-14 DIAGNOSIS — E119 Type 2 diabetes mellitus without complications: Secondary | ICD-10-CM | POA: Diagnosis not present

## 2014-06-14 DIAGNOSIS — N2581 Secondary hyperparathyroidism of renal origin: Secondary | ICD-10-CM | POA: Diagnosis not present

## 2014-06-14 DIAGNOSIS — D509 Iron deficiency anemia, unspecified: Secondary | ICD-10-CM | POA: Diagnosis not present

## 2014-06-17 DIAGNOSIS — D509 Iron deficiency anemia, unspecified: Secondary | ICD-10-CM | POA: Diagnosis not present

## 2014-06-17 DIAGNOSIS — N186 End stage renal disease: Secondary | ICD-10-CM | POA: Diagnosis not present

## 2014-06-17 DIAGNOSIS — N2581 Secondary hyperparathyroidism of renal origin: Secondary | ICD-10-CM | POA: Diagnosis not present

## 2014-06-17 DIAGNOSIS — E119 Type 2 diabetes mellitus without complications: Secondary | ICD-10-CM | POA: Diagnosis not present

## 2014-06-17 DIAGNOSIS — D631 Anemia in chronic kidney disease: Secondary | ICD-10-CM | POA: Diagnosis not present

## 2014-06-19 DIAGNOSIS — D509 Iron deficiency anemia, unspecified: Secondary | ICD-10-CM | POA: Diagnosis not present

## 2014-06-19 DIAGNOSIS — E119 Type 2 diabetes mellitus without complications: Secondary | ICD-10-CM | POA: Diagnosis not present

## 2014-06-19 DIAGNOSIS — N186 End stage renal disease: Secondary | ICD-10-CM | POA: Diagnosis not present

## 2014-06-19 DIAGNOSIS — N2581 Secondary hyperparathyroidism of renal origin: Secondary | ICD-10-CM | POA: Diagnosis not present

## 2014-06-19 DIAGNOSIS — D631 Anemia in chronic kidney disease: Secondary | ICD-10-CM | POA: Diagnosis not present

## 2014-06-21 DIAGNOSIS — N2581 Secondary hyperparathyroidism of renal origin: Secondary | ICD-10-CM | POA: Diagnosis not present

## 2014-06-21 DIAGNOSIS — N186 End stage renal disease: Secondary | ICD-10-CM | POA: Diagnosis not present

## 2014-06-21 DIAGNOSIS — D631 Anemia in chronic kidney disease: Secondary | ICD-10-CM | POA: Diagnosis not present

## 2014-06-21 DIAGNOSIS — D509 Iron deficiency anemia, unspecified: Secondary | ICD-10-CM | POA: Diagnosis not present

## 2014-06-21 DIAGNOSIS — E119 Type 2 diabetes mellitus without complications: Secondary | ICD-10-CM | POA: Diagnosis not present

## 2014-06-24 DIAGNOSIS — N186 End stage renal disease: Secondary | ICD-10-CM | POA: Diagnosis not present

## 2014-06-24 DIAGNOSIS — E119 Type 2 diabetes mellitus without complications: Secondary | ICD-10-CM | POA: Diagnosis not present

## 2014-06-24 DIAGNOSIS — D631 Anemia in chronic kidney disease: Secondary | ICD-10-CM | POA: Diagnosis not present

## 2014-06-24 DIAGNOSIS — N2581 Secondary hyperparathyroidism of renal origin: Secondary | ICD-10-CM | POA: Diagnosis not present

## 2014-06-24 DIAGNOSIS — D509 Iron deficiency anemia, unspecified: Secondary | ICD-10-CM | POA: Diagnosis not present

## 2014-06-25 DIAGNOSIS — L97529 Non-pressure chronic ulcer of other part of left foot with unspecified severity: Secondary | ICD-10-CM | POA: Diagnosis not present

## 2014-06-26 DIAGNOSIS — N186 End stage renal disease: Secondary | ICD-10-CM | POA: Diagnosis not present

## 2014-06-26 DIAGNOSIS — D509 Iron deficiency anemia, unspecified: Secondary | ICD-10-CM | POA: Diagnosis not present

## 2014-06-26 DIAGNOSIS — E119 Type 2 diabetes mellitus without complications: Secondary | ICD-10-CM | POA: Diagnosis not present

## 2014-06-26 DIAGNOSIS — D631 Anemia in chronic kidney disease: Secondary | ICD-10-CM | POA: Diagnosis not present

## 2014-06-26 DIAGNOSIS — N2581 Secondary hyperparathyroidism of renal origin: Secondary | ICD-10-CM | POA: Diagnosis not present

## 2014-06-27 DIAGNOSIS — Z992 Dependence on renal dialysis: Secondary | ICD-10-CM | POA: Diagnosis not present

## 2014-06-27 DIAGNOSIS — Z853 Personal history of malignant neoplasm of breast: Secondary | ICD-10-CM | POA: Diagnosis not present

## 2014-06-27 DIAGNOSIS — N2581 Secondary hyperparathyroidism of renal origin: Secondary | ICD-10-CM | POA: Diagnosis not present

## 2014-06-27 DIAGNOSIS — Z8619 Personal history of other infectious and parasitic diseases: Secondary | ICD-10-CM | POA: Diagnosis not present

## 2014-06-27 DIAGNOSIS — I10 Essential (primary) hypertension: Secondary | ICD-10-CM | POA: Diagnosis not present

## 2014-06-27 DIAGNOSIS — N186 End stage renal disease: Secondary | ICD-10-CM | POA: Diagnosis not present

## 2014-06-28 DIAGNOSIS — D509 Iron deficiency anemia, unspecified: Secondary | ICD-10-CM | POA: Diagnosis not present

## 2014-06-28 DIAGNOSIS — N2581 Secondary hyperparathyroidism of renal origin: Secondary | ICD-10-CM | POA: Diagnosis not present

## 2014-06-28 DIAGNOSIS — N186 End stage renal disease: Secondary | ICD-10-CM | POA: Diagnosis not present

## 2014-06-28 DIAGNOSIS — D631 Anemia in chronic kidney disease: Secondary | ICD-10-CM | POA: Diagnosis not present

## 2014-06-28 DIAGNOSIS — E119 Type 2 diabetes mellitus without complications: Secondary | ICD-10-CM | POA: Diagnosis not present

## 2014-07-01 DIAGNOSIS — E119 Type 2 diabetes mellitus without complications: Secondary | ICD-10-CM | POA: Diagnosis not present

## 2014-07-01 DIAGNOSIS — D509 Iron deficiency anemia, unspecified: Secondary | ICD-10-CM | POA: Diagnosis not present

## 2014-07-01 DIAGNOSIS — D631 Anemia in chronic kidney disease: Secondary | ICD-10-CM | POA: Diagnosis not present

## 2014-07-01 DIAGNOSIS — N2581 Secondary hyperparathyroidism of renal origin: Secondary | ICD-10-CM | POA: Diagnosis not present

## 2014-07-01 DIAGNOSIS — N186 End stage renal disease: Secondary | ICD-10-CM | POA: Diagnosis not present

## 2014-07-02 ENCOUNTER — Encounter: Payer: Self-pay | Admitting: Cardiovascular Disease

## 2014-07-02 ENCOUNTER — Ambulatory Visit (INDEPENDENT_AMBULATORY_CARE_PROVIDER_SITE_OTHER): Payer: Medicare Other | Admitting: Cardiovascular Disease

## 2014-07-02 ENCOUNTER — Telehealth: Payer: Self-pay | Admitting: Cardiovascular Disease

## 2014-07-02 VITALS — BP 152/68 | HR 64 | Ht 62.0 in | Wt 110.0 lb

## 2014-07-02 DIAGNOSIS — N186 End stage renal disease: Secondary | ICD-10-CM

## 2014-07-02 DIAGNOSIS — I739 Peripheral vascular disease, unspecified: Secondary | ICD-10-CM | POA: Diagnosis not present

## 2014-07-02 NOTE — Assessment & Plan Note (Signed)
Dialysis days are on Monday, Wednesday and Friday. Her nephrologist is Dr. Moshe Cipro.

## 2014-07-02 NOTE — Telephone Encounter (Signed)
I spoke with [name] and she said that the pt is not a smoker and this needs to be changed in the chart.  She states the pt quit smoking 20 years ago.  She would also like Dr Shelva Majestic name added to the pt's chart since she follows her for dialysis.  I made her aware that I will make her part of the pt's care team.

## 2014-07-02 NOTE — Assessment & Plan Note (Signed)
The patient has early gangrenous changes affecting the left big toe after an injury. She has absent distal pulses likely has underlying peripheral arterial disease given her multiple risk factors. Due to that, I recommend proceeding with abdominal aortogram, lower extremity runoff and possible endovascular intervention. Risks, benefits and alternatives were discussed with the patient. Continue with wound care. I wil obtain lower extremity arterial Doppler before the procedure. She reports no previous bleeding complications but she has been told about caution with blood thinners given that she is on dialysis.

## 2014-07-02 NOTE — Patient Instructions (Addendum)
Medication Instructions:  Your physician recommends that you continue on your current medications as directed. Please refer to the Current Medication list given to you today.  Labwork: No new orders.  Testing/Procedures: Your physician has requested that you have a lower extremity arterial doppler prior to angiogram on 07/10/14. This test is an ultrasound of the arteries in the legs. It looks at arterial blood flow in the legs. Allow one hour for Lower Arterial scans. There are no restrictions or special instructions  Your physician has requested that you have a peripheral vascular angiogram. This exam is performed at the hospital. During this exam IV contrast is used to look at arterial blood flow. Please review the information sheet given for details.  Follow-Up: We will arrange follow-up after angiogram.   Any Other Special Instructions Will Be Listed Below (If Applicable).
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
## 2014-07-02 NOTE — Telephone Encounter (Signed)
Follow UP  POA called on pt's belhaf. She states that there are errors on the pt's chart. Pt is a non smoker. Has not smoled in over 20 years. ANnd she does take blood thinners the. Request a call back from the nurse to fiscuss

## 2014-07-02 NOTE — Telephone Encounter (Signed)
Left message on machine for [name] to contact the office.

## 2014-07-03 DIAGNOSIS — D631 Anemia in chronic kidney disease: Secondary | ICD-10-CM | POA: Diagnosis not present

## 2014-07-03 DIAGNOSIS — E119 Type 2 diabetes mellitus without complications: Secondary | ICD-10-CM | POA: Diagnosis not present

## 2014-07-03 DIAGNOSIS — D509 Iron deficiency anemia, unspecified: Secondary | ICD-10-CM | POA: Diagnosis not present

## 2014-07-03 DIAGNOSIS — N186 End stage renal disease: Secondary | ICD-10-CM | POA: Diagnosis not present

## 2014-07-03 DIAGNOSIS — N2581 Secondary hyperparathyroidism of renal origin: Secondary | ICD-10-CM | POA: Diagnosis not present

## 2014-07-03 DIAGNOSIS — E1129 Type 2 diabetes mellitus with other diabetic kidney complication: Secondary | ICD-10-CM | POA: Diagnosis not present

## 2014-07-05 DIAGNOSIS — D631 Anemia in chronic kidney disease: Secondary | ICD-10-CM | POA: Diagnosis not present

## 2014-07-05 DIAGNOSIS — N186 End stage renal disease: Secondary | ICD-10-CM | POA: Diagnosis not present

## 2014-07-05 DIAGNOSIS — N2581 Secondary hyperparathyroidism of renal origin: Secondary | ICD-10-CM | POA: Diagnosis not present

## 2014-07-05 DIAGNOSIS — D509 Iron deficiency anemia, unspecified: Secondary | ICD-10-CM | POA: Diagnosis not present

## 2014-07-05 DIAGNOSIS — E119 Type 2 diabetes mellitus without complications: Secondary | ICD-10-CM | POA: Diagnosis not present

## 2014-07-06 DIAGNOSIS — E1129 Type 2 diabetes mellitus with other diabetic kidney complication: Secondary | ICD-10-CM | POA: Diagnosis not present

## 2014-07-06 DIAGNOSIS — N186 End stage renal disease: Secondary | ICD-10-CM | POA: Diagnosis not present

## 2014-07-06 DIAGNOSIS — Z992 Dependence on renal dialysis: Secondary | ICD-10-CM | POA: Diagnosis not present

## 2014-07-08 DIAGNOSIS — D509 Iron deficiency anemia, unspecified: Secondary | ICD-10-CM | POA: Diagnosis not present

## 2014-07-08 DIAGNOSIS — E119 Type 2 diabetes mellitus without complications: Secondary | ICD-10-CM | POA: Diagnosis not present

## 2014-07-08 DIAGNOSIS — N186 End stage renal disease: Secondary | ICD-10-CM | POA: Diagnosis not present

## 2014-07-08 DIAGNOSIS — D631 Anemia in chronic kidney disease: Secondary | ICD-10-CM | POA: Diagnosis not present

## 2014-07-08 DIAGNOSIS — N2581 Secondary hyperparathyroidism of renal origin: Secondary | ICD-10-CM | POA: Diagnosis not present

## 2014-07-09 ENCOUNTER — Other Ambulatory Visit: Payer: Self-pay | Admitting: Cardiovascular Disease

## 2014-07-09 ENCOUNTER — Ambulatory Visit (HOSPITAL_BASED_OUTPATIENT_CLINIC_OR_DEPARTMENT_OTHER): Payer: Medicare Other

## 2014-07-09 ENCOUNTER — Ambulatory Visit (HOSPITAL_COMMUNITY): Payer: Medicare Other | Attending: Internal Medicine

## 2014-07-09 DIAGNOSIS — E785 Hyperlipidemia, unspecified: Secondary | ICD-10-CM | POA: Diagnosis not present

## 2014-07-09 DIAGNOSIS — Z720 Tobacco use: Secondary | ICD-10-CM | POA: Insufficient documentation

## 2014-07-09 DIAGNOSIS — I96 Gangrene, not elsewhere classified: Secondary | ICD-10-CM

## 2014-07-09 DIAGNOSIS — E119 Type 2 diabetes mellitus without complications: Secondary | ICD-10-CM | POA: Diagnosis not present

## 2014-07-09 DIAGNOSIS — I739 Peripheral vascular disease, unspecified: Secondary | ICD-10-CM

## 2014-07-09 DIAGNOSIS — I10 Essential (primary) hypertension: Secondary | ICD-10-CM | POA: Diagnosis not present

## 2014-07-10 ENCOUNTER — Ambulatory Visit (HOSPITAL_COMMUNITY)
Admission: RE | Admit: 2014-07-10 | Discharge: 2014-07-11 | Disposition: A | Discharge status: Home / Self Care | Payer: Medicare Other | Source: Ambulatory Visit | Attending: Cardiovascular Disease | Admitting: Cardiovascular Disease

## 2014-07-10 ENCOUNTER — Encounter (HOSPITAL_COMMUNITY): Payer: Self-pay | Admitting: Cardiovascular Disease

## 2014-07-10 ENCOUNTER — Ambulatory Visit (HOSPITAL_COMMUNITY): Admission: RE | Admit: 2014-07-10 | Payer: Medicare Other | Source: Ambulatory Visit | Admitting: Cardiovascular Disease

## 2014-07-10 ENCOUNTER — Other Ambulatory Visit: Payer: Self-pay

## 2014-07-10 ENCOUNTER — Encounter (HOSPITAL_COMMUNITY)
Admission: RE | Disposition: A | Discharge status: Home / Self Care | Payer: PRIVATE HEALTH INSURANCE | Source: Ambulatory Visit | Attending: Cardiovascular Disease

## 2014-07-10 ENCOUNTER — Encounter (HOSPITAL_COMMUNITY): Admission: RE | Payer: Self-pay | Source: Ambulatory Visit

## 2014-07-10 DIAGNOSIS — I70262 Atherosclerosis of native arteries of extremities with gangrene, left leg: Secondary | ICD-10-CM | POA: Diagnosis not present

## 2014-07-10 DIAGNOSIS — I120 Hypertensive chronic kidney disease with stage 5 chronic kidney disease or end stage renal disease: Secondary | ICD-10-CM | POA: Insufficient documentation

## 2014-07-10 DIAGNOSIS — Z853 Personal history of malignant neoplasm of breast: Secondary | ICD-10-CM | POA: Diagnosis not present

## 2014-07-10 DIAGNOSIS — I70213 Atherosclerosis of native arteries of extremities with intermittent claudication, bilateral legs: Secondary | ICD-10-CM | POA: Diagnosis not present

## 2014-07-10 DIAGNOSIS — E119 Type 2 diabetes mellitus without complications: Secondary | ICD-10-CM | POA: Diagnosis not present

## 2014-07-10 DIAGNOSIS — I10 Essential (primary) hypertension: Secondary | ICD-10-CM | POA: Diagnosis present

## 2014-07-10 DIAGNOSIS — Z992 Dependence on renal dialysis: Secondary | ICD-10-CM | POA: Diagnosis not present

## 2014-07-10 DIAGNOSIS — C50511 Malignant neoplasm of lower-outer quadrant of right female breast: Secondary | ICD-10-CM | POA: Diagnosis present

## 2014-07-10 DIAGNOSIS — E785 Hyperlipidemia, unspecified: Secondary | ICD-10-CM | POA: Diagnosis not present

## 2014-07-10 DIAGNOSIS — B1920 Unspecified viral hepatitis C without hepatic coma: Secondary | ICD-10-CM | POA: Diagnosis present

## 2014-07-10 DIAGNOSIS — N186 End stage renal disease: Secondary | ICD-10-CM | POA: Diagnosis not present

## 2014-07-10 DIAGNOSIS — F17210 Nicotine dependence, cigarettes, uncomplicated: Secondary | ICD-10-CM | POA: Diagnosis not present

## 2014-07-10 DIAGNOSIS — I739 Peripheral vascular disease, unspecified: Secondary | ICD-10-CM | POA: Diagnosis present

## 2014-07-10 HISTORY — PX: PERIPHERAL VASCULAR CATHETERIZATION: SHX172C

## 2014-07-10 HISTORY — DX: Malignant neoplasm of unspecified site of right female breast: C50.911

## 2014-07-10 HISTORY — DX: Malignant neoplasm of unspecified site of left female breast: C50.912

## 2014-07-10 LAB — BASIC METABOLIC PANEL
ANION GAP: 14 (ref 5–15)
BUN: 51 mg/dL — ABNORMAL HIGH (ref 6–20)
CALCIUM: 10.4 mg/dL — AB (ref 8.9–10.3)
CO2: 32 mmol/L (ref 22–32)
Chloride: 92 mmol/L — ABNORMAL LOW (ref 101–111)
Creatinine, Ser: 6.43 mg/dL — ABNORMAL HIGH (ref 0.44–1.00)
GFR calc Af Amer: 6 mL/min — ABNORMAL LOW (ref >60)
GFR, EST NON AFRICAN AMERICAN: 5 mL/min — AB (ref >60)
Glucose, Bld: 85 mg/dL (ref 70–99)
Potassium: 4.1 mmol/L (ref 3.5–5.1)
SODIUM: 138 mmol/L (ref 135–145)

## 2014-07-10 LAB — GLUCOSE, CAPILLARY
GLUCOSE-CAPILLARY: 70 mg/dL (ref 70–99)
GLUCOSE-CAPILLARY: 118 mg/dL — AB (ref 70–99)

## 2014-07-10 LAB — CBC
HCT: 35.8 % — ABNORMAL LOW (ref 36.0–46.0)
Hemoglobin: 11.9 g/dL — ABNORMAL LOW (ref 12.0–15.0)
MCH: 31.5 pg (ref 26.0–34.0)
MCHC: 33.2 g/dL (ref 30.0–36.0)
MCV: 94.7 fL (ref 78.0–100.0)
Platelets: 182 10*3/uL (ref 150–400)
RBC: 3.78 MIL/uL — ABNORMAL LOW (ref 3.87–5.11)
RDW: 16.8 % — ABNORMAL HIGH (ref 11.5–15.5)
WBC: 5.6 10*3/uL (ref 4.0–10.5)

## 2014-07-10 LAB — PROTIME-INR
INR: 1.03 (ref 0.00–1.49)
Prothrombin Time: 13.6 seconds (ref 11.6–15.2)

## 2014-07-10 SURGERY — ABDOMINAL AORTAGRAM
Anesthesia: LOCAL

## 2014-07-10 SURGERY — ABDOMINAL AORTOGRAM

## 2014-07-10 MED ORDER — SODIUM CHLORIDE 0.9 % IJ SOLN
3.0000 mL | Freq: Two times a day (BID) | INTRAMUSCULAR | Status: DC
  Administered 2014-07-10: 3 mL via INTRAVENOUS

## 2014-07-10 MED ORDER — SODIUM CHLORIDE 0.9 % IV SOLN
250.0000 mL | INTRAVENOUS | Status: DC | PRN
Start: 2014-07-10 — End: 2014-07-10

## 2014-07-10 MED ORDER — SODIUM CHLORIDE 0.9 % IV SOLN: INTRAVENOUS | Status: DC

## 2014-07-10 MED ORDER — CINACALCET HCL 30 MG PO TABS
60.0000 mg | ORAL_TABLET | Freq: Every day | ORAL | Status: DC
  Administered 2014-07-11: 60 mg via ORAL
  Filled 2014-07-10 (×2): qty 2

## 2014-07-10 MED ORDER — AMLODIPINE BESYLATE 5 MG PO TABS
5.0000 mg | ORAL_TABLET | Freq: Every day | ORAL | Status: DC
  Administered 2014-07-10: 5 mg via ORAL
  Filled 2014-07-10 (×2): qty 1

## 2014-07-10 MED ORDER — SODIUM CHLORIDE 0.9 % IV SOLN: 250.0000 mL | INTRAVENOUS | Status: DC | PRN

## 2014-07-10 MED ORDER — IBUPROFEN 200 MG PO TABS: 200.0000 mg | ORAL_TABLET | Freq: Four times a day (QID) | ORAL | Status: DC | PRN

## 2014-07-10 MED ORDER — LANTHANUM CARBONATE 500 MG PO CHEW
1000.0000 mg | CHEWABLE_TABLET | Freq: Two times a day (BID) | ORAL | Status: DC
  Administered 2014-07-10 – 2014-07-11 (×2): 1000 mg via ORAL
  Filled 2014-07-10 (×4): qty 2

## 2014-07-10 MED ORDER — GUAIFENESIN-DM 100-10 MG/5ML PO SYRP
5.0000 mL | ORAL_SOLUTION | ORAL | Status: DC | PRN
  Administered 2014-07-11 (×2): 5 mL via ORAL
  Filled 2014-07-10 (×2): qty 5

## 2014-07-10 MED ORDER — BENZONATATE 100 MG PO CAPS
100.0000 mg | ORAL_CAPSULE | Freq: Three times a day (TID) | ORAL | Status: DC | PRN
  Administered 2014-07-10 – 2014-07-11 (×2): 100 mg via ORAL
  Filled 2014-07-10 (×2): qty 1

## 2014-07-10 MED ORDER — ANASTROZOLE 1 MG PO TABS
1.0000 mg | ORAL_TABLET | Freq: Every day | ORAL | Status: DC
  Administered 2014-07-10: 19:00:00 1 mg via ORAL
  Filled 2014-07-10 (×2): qty 1

## 2014-07-10 MED ORDER — SODIUM CHLORIDE 0.9 % IJ SOLN: 3.0000 mL | INTRAMUSCULAR | Status: DC | PRN

## 2014-07-10 MED ORDER — METOPROLOL TARTRATE 12.5 MG HALF TABLET
25.0000 mg | ORAL_TABLET | Freq: Two times a day (BID) | ORAL | Status: DC
  Administered 2014-07-10 – 2014-07-11 (×2): 25 mg via ORAL
  Filled 2014-07-10 (×2): qty 2

## 2014-07-10 MED ORDER — CINACALCET HCL 30 MG PO TABS: 60.0000 mg | ORAL_TABLET | Freq: Every day | ORAL | Status: DC

## 2014-07-10 MED ORDER — IODIXANOL 320 MG/ML IV SOLN
INTRAVENOUS | Status: DC | PRN
  Administered 2014-07-10: 97 mL via INTRAVENOUS

## 2014-07-10 MED ORDER — SODIUM CHLORIDE 0.9 % IJ SOLN: 3.0000 mL | Freq: Two times a day (BID) | INTRAMUSCULAR | Status: DC

## 2014-07-10 MED ORDER — CINACALCET HCL 30 MG PO TABS
30.0000 mg | ORAL_TABLET | Freq: Once | ORAL | Status: AC
  Administered 2014-07-10: 20:00:00 30 mg via ORAL
  Filled 2014-07-10: qty 1

## 2014-07-10 MED ORDER — CINACALCET HCL 30 MG PO TABS
30.0000 mg | ORAL_TABLET | Freq: Every day | ORAL | Status: DC
  Administered 2014-07-10: 19:00:00 30 mg via ORAL
  Filled 2014-07-10: qty 1

## 2014-07-10 MED ORDER — MIDAZOLAM HCL 2 MG/2ML IJ SOLN
INTRAMUSCULAR | Status: DC | PRN
  Administered 2014-07-10 (×2): 1 mg via INTRAVENOUS

## 2014-07-10 SURGICAL SUPPLY — 8 items
HAND CONTROLLER AVANTA (MISCELLANEOUS) ×2 IMPLANT
KIT PV (KITS) ×2 IMPLANT
SET AVANTA SINGLE PATIENT (MISCELLANEOUS) ×2 IMPLANT
SET INTRODUCER MICROPUNCT 5F (INTRODUCER) ×2 IMPLANT
SHEATH PINNACLE 5F 10CM (SHEATH) ×2 IMPLANT
TRANSDUCER W/STOPCOCK (MISCELLANEOUS) ×2 IMPLANT
TRAY PV CATH (CUSTOM PROCEDURE TRAY) ×2 IMPLANT
WIRE HITORQ VERSACORE ST 145CM (WIRE) ×2 IMPLANT

## 2014-07-10 NOTE — Progress Notes (Signed)
Site area: right groin a 5 french arterial sheath  Site Prior to Removal:  Level 0  Pressure Applied For 20 MINUTES    Minutes Beginning at 1525p  Manual:   Yes.    Patient Status During Pull:  stable  Post Pull Groin Site:  Level 0  Post Pull Instructions Given:  Yes.    Post Pull Pulses Present:  Yes.    Dressing Applied:  Yes.    Comments:  VS remain stable at this time.  Pt denies any discomfort at site at this time
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
## 2014-07-10 NOTE — Interval H&P Note (Signed)
History and Physical Interval Note:  07/10/2014 1:58 PM  [name]  has presented today for surgery, with the diagnosis of pad  The various methods of treatment have been discussed with the patient and family. After consideration of risks, benefits and other options for treatment, the patient has consented to  Procedure(s): Abdominal Aortogram (N/A) as a surgical intervention .  The patient's history has been reviewed, patient examined, no change in status, stable for surgery.  I have reviewed the patient's chart and labs.  Questions were answered to the patient's satisfaction.     Kathlyn Sacramento

## 2014-07-11 ENCOUNTER — Encounter (HOSPITAL_COMMUNITY): Payer: Self-pay | Admitting: Physician Assistant

## 2014-07-11 DIAGNOSIS — I120 Hypertensive chronic kidney disease with stage 5 chronic kidney disease or end stage renal disease: Secondary | ICD-10-CM | POA: Diagnosis not present

## 2014-07-11 DIAGNOSIS — B1920 Unspecified viral hepatitis C without hepatic coma: Secondary | ICD-10-CM | POA: Diagnosis not present

## 2014-07-11 DIAGNOSIS — I10 Essential (primary) hypertension: Secondary | ICD-10-CM | POA: Diagnosis present

## 2014-07-11 DIAGNOSIS — E785 Hyperlipidemia, unspecified: Secondary | ICD-10-CM | POA: Diagnosis not present

## 2014-07-11 DIAGNOSIS — Z992 Dependence on renal dialysis: Secondary | ICD-10-CM

## 2014-07-11 DIAGNOSIS — I70262 Atherosclerosis of native arteries of extremities with gangrene, left leg: Secondary | ICD-10-CM | POA: Diagnosis not present

## 2014-07-11 DIAGNOSIS — I739 Peripheral vascular disease, unspecified: Secondary | ICD-10-CM

## 2014-07-11 DIAGNOSIS — N186 End stage renal disease: Secondary | ICD-10-CM

## 2014-07-11 DIAGNOSIS — E119 Type 2 diabetes mellitus without complications: Secondary | ICD-10-CM | POA: Diagnosis not present

## 2014-07-11 LAB — GLUCOSE, CAPILLARY: GLUCOSE-CAPILLARY: 88 mg/dL (ref 70–99)

## 2014-07-11 NOTE — Progress Notes (Signed)
1255 Message left on [facility] phone # [phone]. Patient follow up appointment rescheduled for [facility] office with Dr.Arida on May 24th at 1000. [name] called back at 1300 and confirmed appointment for May 24th at 1000.

## 2014-07-11 NOTE — Progress Notes (Signed)
Patient Name: [name] Date of Encounter: 07/11/2014     Active Problems:   Breast cancer of lower-outer quadrant of right female breast   End stage renal disease   PVD (peripheral vascular disease)    SUBJECTIVE  Feeling well ready to go home.   CURRENT MEDS . amLODipine  5 mg Oral Daily  . anastrozole  1 mg Oral Daily  . cinacalcet  60 mg Oral Q breakfast  . lanthanum  1,000 mg Oral BID WC  . metoprolol tartrate  25 mg Oral BID    OBJECTIVE  Filed Vitals:   07/10/14 2100 07/10/14 2335 07/11/14 0016 07/11/14 0426  BP: 157/47 158/43  150/34  Pulse:  65  73  Temp:  97.7 F (36.5 C)  97.8 F (36.6 C)  TempSrc:  Oral  Oral  Resp:  17  19  Height:      Weight:   110 lb 10.7 oz (50.2 kg)   SpO2:  100%  100%    Intake/Output Summary (Last 24 hours) at 07/11/14 0751 Last data filed at 07/11/14 0500  Gross per 24 hour  Intake    360 ml  Output      0 ml  Net    360 ml   Filed Weights   07/10/14 0858 07/11/14 0016  Weight: 110 lb (49.896 kg) 110 lb 10.7 oz (50.2 kg)    PHYSICAL EXAM  General: Pleasant, NAD. Elderly and frail.  Neuro: Alert and oriented X 3. Moves all extremities spontaneously. Psych: Normal affect. HEENT:  Normal  Neck: Supple without bruits or JVD. Lungs:  Resp regular and unlabored, CTA. Heart: RRR no s3, s4, or murmurs. Abdomen: Soft, non-tender, non-distended, BS + x 4.  Extremities: No clubbing, cyanosis or edema. DP/PT/Radials 2+ and equal bilaterally.  Accessory Clinical Findings  CBC  Recent Labs  07/10/14 1011  WBC 5.6  HGB 11.9*  HCT 35.8*  MCV 94.7  PLT 778   Basic Metabolic Panel  Recent Labs  07/10/14 1011  NA 138  K 4.1  CL 92*  CO2 32  GLUCOSE 85  BUN 51*  CREATININE 6.43*  CALCIUM 10.4*    TELE  NSR with PVCs.    Radiology/Studies  No results found.  ASSESSMENT AND PLAN  [name] is a 81 y.o. female with a history of ESRD on HD, hx of breast cancer, HTN, DM, previous R toe  amputations and recent left big toe injury who was referred to Dr. Fletcher Anon for referred for evaluation of a left foot wound with suspicion of underlying PAD.  PAD/Left gangrenous toe s/p mechanical injury- The patient had early gangrenous changes affecting the left big toe after an injury. She had absent distal pulses and felt to have underlying PAD given her multiple risk factors. Due to that, she was set up for abdominal aortogram, lower extremity runoff and possible endovascular intervention with Dr. Fletcher Anon -- She underwent peripheral angioram on 07/10/14 which revealed heavily calcified arteries of the lower extremities with mild to moderate diffuse nonobstructive disease. -- Medical therapy recommended. No revascularization is needed. -- Femoral site stable.  -- She will follow up with Dr. Fletcher Anon in clinic in 2-3 weeks.  -- Currently she is not on ASA or statin. Will discuss with MD.   ESRD on HD on M, W, F- She was supposed to go home yesterday but could not get a ride. They attempted to get her HD yesterday in the hospital but were unable to  due to scheduling issues. I spoke with Dr. Fletcher Anon who said it was okay for discharge today with HD tomorrow AM as usually scheduled.   HTN- cont metoprolol 25mg  BID  Judy Pimple PA-C  Pager [phone]  Patient seen, examined. Available data reviewed. Agree with findings, assessment, and plan as outlined by Angelena Form, PA-C. The patient's right groin site is clear on exam. Reviewed her medical program. Amlodipine was added yesterday for high blood pressure. However, after receiving her home metoprolol this morning, her blood pressure is within normal limits. Considering the fact that she has end-stage renal disease on dialysis, I think we should just continue metoprolol as there is a risk of hypotension associated with dialysis. Will continue her home medical program. She cannot take aspirin or other blood thinning medicines per review of  notes.  Sherren Mocha, M.D. 07/11/2014 9:48 AM

## 2014-07-11 NOTE — Discharge Summary (Signed)
Discharge Summary   Patient ID: [name] MRN: [id number], DOB/AGE: 1932-05-02 81 y.o. Admit date: 07/10/2014 D/C date:     07/11/2014  Primary Cardiologist: Dr. Fletcher Anon  Principal Problem:   PVD (peripheral vascular disease) Active Problems:   Breast cancer of lower-outer quadrant of right female breast   End stage renal disease   ESRD (end stage renal disease) on dialysis   Hepatitis C   Hypertension   Dyslipidemia   Admission Dates: 07/10/14-07/11/14 Discharge Diagnosis: PVD s/p peripheral angiogram with no interventions  HPI: [name] is a 81 y.o. female with a history of ESRD on HD, hx of breast cancer, HTN, DM, previous R toe amputations and recent left big toe injury who was referred to Dr. Fletcher Anon for referred for evaluation of a left foot wound with suspicion of underlying PAD.  Hospital Course  PAD/Left gangrenous toe s/p mechanical injury- The patient had early gangrenous changes affecting the left big toe after an injury. She had absent distal pulses and felt to have underlying PAD given her multiple risk factors. Due to that, she was set up for abdominal aortogram, lower extremity runoff and possible endovascular intervention with Dr. Fletcher Anon -- She underwent peripheral angioram on 07/10/14 which revealed heavily calcified arteries of the lower extremities with mild to moderate diffuse nonobstructive disease. -- Medical therapy recommended. No revascularization is needed. -- Femoral site stable.  -- She will follow up with Dr. Fletcher Anon in clinic in 2-3 weeks.Will continue her home medical program. She cannot take aspirin or other blood thinning medicines per review of notes.  ESRD on HD on M, W, F- She was supposed to go home yesterday but could not get a ride. They attempted to get her HD yesterday in the hospital but were unable to due to scheduling issues. I spoke with Dr. Fletcher Anon who said it was okay for discharge today with HD tomorrow AM as usually scheduled.   HTN-  Amlodipine was added yesterday for high blood pressure. However, after receiving her home metoprolol this morning, her blood pressure is within normal limits. Considering the fact that she has end-stage renal disease on dialysis, Dr. Burt Knack felt that we should just continue metoprolol as there is a risk of hypotension associated with dialysis -- Continue metoprolol 25mg  BID  The patient has had an uncomplicated hospital course and is recovering well. The femoral catheter site is stable. She has been seen by Dr. Burt Knack today and deemed ready for discharge home. All follow-up appointments have been scheduled. Discharge medications are listed below.   Discharge Vitals: Blood pressure 150/34, pulse 73, temperature 97.8 F (36.6 C), temperature source Oral, resp. rate 19, height 5\' 3"  (1.6 m), weight 110 lb 10.7 oz (50.2 kg), SpO2 100 %.  Labs: Lab Results  Component Value Date   WBC 5.6 07/10/2014   HGB 11.9* 07/10/2014   HCT 35.8* 07/10/2014   MCV 94.7 07/10/2014   PLT 182 07/10/2014     Recent Labs Lab 07/10/14 1011  NA 138  K 4.1  CL 92*  CO2 32  BUN 51*  CREATININE 6.43*  CALCIUM 10.4*  GLUCOSE 85     Diagnostic Studies/Procedures   PV angiogram 07/10/14 Conclusion     Hemodynamics: Central Aortic Pressure / Mean Aortic Pressure: 170/80  Findings: Abdominal aorta: Heavily calcified with no evidence of aneurysm or obstructive disease. Left renal artery: Overall small with no obstructive disease. Right renal artery: Small with no obstructive disease. Celiac artery: Patent Superior mesenteric  artery: Patent  Right common iliac artery: Calcified with mild nonobstructive disease Right internal iliac artery: Minor irregularities Right external iliac artery: Minor irregularities Right common femoral artery: 20% distal stenosis Right profunda femoral artery: Minor irregularities. Right superficial femoral artery: Heavily calcified with diffuse 40% disease  throughout its course.  Right popliteal artery: Heavily calcified with 70% proximal stenosis. Below the knee arteries are not well visualized due to significant motion. There appears to be three-vessel runoff.  Left common iliac artery: Calcified with mild 20% proximal stenosis. Left internal iliac artery: Mild diffuse disease. Left external iliac artery: Minor irregularities. Left common femoral artery: Minor irregularities. Left profunda femoral artery: Minor irregularities. Left superficial femoral artery: Heavily calcified with diffuse 30-40% disease proximally and in the midsegment. There is diffuse 40% disease distally. Left popliteal artery: Heavily calcified with diffuse 50% disease. 3 vessel runoff below the knee although the peroneal artery is overall small.  Conclusions: Heavily calcified arteries of the lower extremities with mild to moderate diffuse nonobstructive disease.  Recommendations:  Medical therapy. No revascularization is needed.     Discharge Medications     Medication List    STOP taking these medications        amLODipine 5 MG tablet  Commonly known as:  NORVASC      TAKE these medications        anastrozole 1 MG tablet  Commonly known as:  ARIMIDEX  Take 1 tablet (1 mg total) by mouth daily.     benzonatate 100 MG capsule  Commonly known as:  TESSALON  Take 100 mg by mouth 3 (three) times daily as needed for cough.     ibuprofen 200 MG tablet  Commonly known as:  ADVIL,MOTRIN  Take 200 mg by mouth every 6 (six) hours as needed for fever, headache or mild pain.     lanthanum 1000 MG chewable tablet  Commonly known as:  FOSRENOL  Chew 1,000 mg by mouth 2 (two) times daily with a meal.     metoprolol tartrate 25 MG tablet  Commonly known as:  LOPRESSOR  Take 25 mg by mouth 2 (two) times daily.     SENSIPAR 60 MG tablet  Generic drug:  cinacalcet  Take 1 tablet by mouth daily.        Disposition   The patient will be discharged  in stable condition to home.  Follow-up Information    Follow up with Kathlyn Sacramento, MD On 07/25/2014.   Specialty:  Cardiology   Why:  @ 11am   Contact information:   [place] [id number] [phone]         Duration of Discharge Encounter: Greater than 30 minutes including physician and PA time.  Signed, Grandville Silos, KATHRYN R PA-C 07/11/2014, 10:04 AM

## 2014-07-11 NOTE — Discharge Instructions (Signed)

## 2014-07-11 NOTE — Consult Note (Signed)
WOC wound consult note Reason for Consult: evaluation of left toe wound Wound type:arterial ulcer, dry gangrene left great toe Pressure Ulcer POA: No Measurement: entire toe tip dry eschar Wound bed: stable hard eschar, one small area on the dorsal toe surface that is pink, but not draining Drainage (amount, consistency, odor) none Periwound: intact  Dressing procedure/placement/frequency: Apply betadine daily to dry this area and keep stable.  No dressing needed unless to protect her toe from shoe. Advised patient to obtain dry gauze OTC to use when wearing a shoe.  Nothing wet or covering the toe. She has been using Epson salt soaks at home, I have advised her if she continues to use these, she reports it helps with pain that she needs to make sure the toe and web space are dried through ly afterwards.  WOC painted the left great toe with betadine, allowed to air dry.   Discussed POC with patient and bedside nurse.  Re consult if needed, will not follow at this time. Thanks  Eara Burruel Kellogg, [place] [phone])

## 2014-07-12 DIAGNOSIS — D509 Iron deficiency anemia, unspecified: Secondary | ICD-10-CM | POA: Diagnosis not present

## 2014-07-12 DIAGNOSIS — E119 Type 2 diabetes mellitus without complications: Secondary | ICD-10-CM | POA: Diagnosis not present

## 2014-07-12 DIAGNOSIS — N2581 Secondary hyperparathyroidism of renal origin: Secondary | ICD-10-CM | POA: Diagnosis not present

## 2014-07-12 DIAGNOSIS — D631 Anemia in chronic kidney disease: Secondary | ICD-10-CM | POA: Diagnosis not present

## 2014-07-12 DIAGNOSIS — N186 End stage renal disease: Secondary | ICD-10-CM | POA: Diagnosis not present

## 2014-07-15 DIAGNOSIS — N186 End stage renal disease: Secondary | ICD-10-CM | POA: Diagnosis not present

## 2014-07-15 DIAGNOSIS — N2581 Secondary hyperparathyroidism of renal origin: Secondary | ICD-10-CM | POA: Diagnosis not present

## 2014-07-15 DIAGNOSIS — D631 Anemia in chronic kidney disease: Secondary | ICD-10-CM | POA: Diagnosis not present

## 2014-07-15 DIAGNOSIS — E119 Type 2 diabetes mellitus without complications: Secondary | ICD-10-CM | POA: Diagnosis not present

## 2014-07-15 DIAGNOSIS — D509 Iron deficiency anemia, unspecified: Secondary | ICD-10-CM | POA: Diagnosis not present

## 2014-07-17 DIAGNOSIS — N2581 Secondary hyperparathyroidism of renal origin: Secondary | ICD-10-CM | POA: Diagnosis not present

## 2014-07-17 DIAGNOSIS — E119 Type 2 diabetes mellitus without complications: Secondary | ICD-10-CM | POA: Diagnosis not present

## 2014-07-17 DIAGNOSIS — N186 End stage renal disease: Secondary | ICD-10-CM | POA: Diagnosis not present

## 2014-07-17 DIAGNOSIS — D631 Anemia in chronic kidney disease: Secondary | ICD-10-CM | POA: Diagnosis not present

## 2014-07-17 DIAGNOSIS — D509 Iron deficiency anemia, unspecified: Secondary | ICD-10-CM | POA: Diagnosis not present

## 2014-07-19 DIAGNOSIS — E119 Type 2 diabetes mellitus without complications: Secondary | ICD-10-CM | POA: Diagnosis not present

## 2014-07-19 DIAGNOSIS — N186 End stage renal disease: Secondary | ICD-10-CM | POA: Diagnosis not present

## 2014-07-19 DIAGNOSIS — N2581 Secondary hyperparathyroidism of renal origin: Secondary | ICD-10-CM | POA: Diagnosis not present

## 2014-07-19 DIAGNOSIS — D509 Iron deficiency anemia, unspecified: Secondary | ICD-10-CM | POA: Diagnosis not present

## 2014-07-19 DIAGNOSIS — D631 Anemia in chronic kidney disease: Secondary | ICD-10-CM | POA: Diagnosis not present

## 2014-07-22 DIAGNOSIS — E119 Type 2 diabetes mellitus without complications: Secondary | ICD-10-CM | POA: Diagnosis not present

## 2014-07-22 DIAGNOSIS — D509 Iron deficiency anemia, unspecified: Secondary | ICD-10-CM | POA: Diagnosis not present

## 2014-07-22 DIAGNOSIS — D631 Anemia in chronic kidney disease: Secondary | ICD-10-CM | POA: Diagnosis not present

## 2014-07-22 DIAGNOSIS — N2581 Secondary hyperparathyroidism of renal origin: Secondary | ICD-10-CM | POA: Diagnosis not present

## 2014-07-22 DIAGNOSIS — N186 End stage renal disease: Secondary | ICD-10-CM | POA: Diagnosis not present

## 2014-07-24 ENCOUNTER — Encounter: Payer: PRIVATE HEALTH INSURANCE | Admitting: Cardiovascular Disease

## 2014-07-24 DIAGNOSIS — D631 Anemia in chronic kidney disease: Secondary | ICD-10-CM | POA: Diagnosis not present

## 2014-07-24 DIAGNOSIS — D509 Iron deficiency anemia, unspecified: Secondary | ICD-10-CM | POA: Diagnosis not present

## 2014-07-24 DIAGNOSIS — N186 End stage renal disease: Secondary | ICD-10-CM | POA: Diagnosis not present

## 2014-07-24 DIAGNOSIS — E119 Type 2 diabetes mellitus without complications: Secondary | ICD-10-CM | POA: Diagnosis not present

## 2014-07-24 DIAGNOSIS — N2581 Secondary hyperparathyroidism of renal origin: Secondary | ICD-10-CM | POA: Diagnosis not present

## 2014-07-25 ENCOUNTER — Encounter: Payer: PRIVATE HEALTH INSURANCE | Admitting: Cardiovascular Disease

## 2014-07-26 DIAGNOSIS — N186 End stage renal disease: Secondary | ICD-10-CM | POA: Diagnosis not present

## 2014-07-26 DIAGNOSIS — E119 Type 2 diabetes mellitus without complications: Secondary | ICD-10-CM | POA: Diagnosis not present

## 2014-07-26 DIAGNOSIS — D509 Iron deficiency anemia, unspecified: Secondary | ICD-10-CM | POA: Diagnosis not present

## 2014-07-26 DIAGNOSIS — D631 Anemia in chronic kidney disease: Secondary | ICD-10-CM | POA: Diagnosis not present

## 2014-07-26 DIAGNOSIS — N2581 Secondary hyperparathyroidism of renal origin: Secondary | ICD-10-CM | POA: Diagnosis not present

## 2014-07-29 DIAGNOSIS — D509 Iron deficiency anemia, unspecified: Secondary | ICD-10-CM | POA: Diagnosis not present

## 2014-07-29 DIAGNOSIS — D631 Anemia in chronic kidney disease: Secondary | ICD-10-CM | POA: Diagnosis not present

## 2014-07-29 DIAGNOSIS — N2581 Secondary hyperparathyroidism of renal origin: Secondary | ICD-10-CM | POA: Diagnosis not present

## 2014-07-29 DIAGNOSIS — N186 End stage renal disease: Secondary | ICD-10-CM | POA: Diagnosis not present

## 2014-07-29 DIAGNOSIS — E119 Type 2 diabetes mellitus without complications: Secondary | ICD-10-CM | POA: Diagnosis not present

## 2014-07-30 ENCOUNTER — Encounter: Payer: Self-pay | Admitting: Cardiovascular Disease

## 2014-07-30 ENCOUNTER — Ambulatory Visit (INDEPENDENT_AMBULATORY_CARE_PROVIDER_SITE_OTHER): Payer: Medicare Other | Admitting: Cardiovascular Disease

## 2014-07-30 VITALS — BP 80/40 | Ht 62.0 in | Wt 110.0 lb

## 2014-07-30 DIAGNOSIS — I739 Peripheral vascular disease, unspecified: Secondary | ICD-10-CM

## 2014-07-30 NOTE — Progress Notes (Signed)
Primary care physician: Dr. Nancy Fetter Primary nephrologist: Dr. Moshe Cipro Referring physician: Dr. Elby Showers at [facility]  HPI  This is a pleasant 81 year old female who is here today for a follow-up visit after recent lower extremity angiogram. She has known history of end-stage renal disease on hemodialysis for about 7 years, breast cancer, hypertension and diabetes. She is not a smoker. Family history is remarkable for cancer but no coronary artery disease. 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 She recently sustained an injury to the left big toe which turned black since then. She denies any claudication.  She was noted to have absent distal pulses and thus was referred for evaluation. She denies any chest pain. She has chronic exertional dyspnea with no recent worsening. Noninvasive evaluation was limited by severe calcifications. I proceeded with lower extremity angiography on May 4 which showed: Heavily calcified arteries of the lower extremities with mild to moderate diffuse nonobstructive disease.  Allergies  Allergen Reactions  . Aspirin     NO BLOOD THINNERS OF ANY KIND     Current Outpatient Prescriptions on File Prior to Visit  Medication Sig Dispense Refill  . anastrozole (ARIMIDEX) 1 MG tablet Take 1 tablet (1 mg total) by mouth daily. 30 tablet 6  . benzonatate (TESSALON) 100 MG capsule Take 100 mg by mouth 3 (three) times daily as needed for cough.    Marland Kitchen ibuprofen (ADVIL,MOTRIN) 200 MG tablet Take 200 mg by mouth every 6 (six) hours as needed for fever, headache or mild pain.    Marland Kitchen lanthanum (FOSRENOL) 1000 MG chewable tablet Chew 1,000 mg by mouth 2 (two) times daily with a meal.     . metoprolol tartrate (LOPRESSOR) 25 MG tablet Take 25 mg by mouth 2 (two) times daily.     . SENSIPAR 60 MG tablet Take 1 tablet by mouth daily.     No current facility-administered medications on file prior to visit.     Past Medical History    Diagnosis Date  . Anemia   . PVD (peripheral vascular disease)     a. s/p peripheral angiogram on 07/10/14 with no PCI and cont med Rx  . Hyperparathyroidism   . Dyslipidemia   . Hypertension   . Hepatitis C   . Vascular disease   . DM (diabetes mellitus)   . ESRD (end stage renal disease) on dialysis     a. [place]: MWF (07/10/2014)  . Arthritis   . Bilateral breast cancer      Past Surgical History  Procedure Laterality Date  . Cataract extraction Right   . Abdominal hysterectomy    . Arteriovenous graft placement Left 03/20/09    "had right arm previously but couldn't use it anymore"  . Median nerve repair Left     "decompression"  . Posterior fusion cervical spine      "have 6 screws"  . Toe amputation Right     1,2nd toes  . Dg av dialysis graft declot or Left 01/27/11, 02/15/11, 03/15/11, 10/13/11    lua  . Thrombectomy and revision of arterioventous (av) goretex  graft Left 04/18/2009  . Uterine fibroid surgery    . Total mastectomy Right 08/07/2013    Procedure: RIGHT TOTAL MASTECTOMY;  Surgeon: [name], MD;  Location: [place];  Service: General;  Laterality: Right;  . Peripheral vascular catheterization N/A 07/10/2014    Procedure: Abdominal Aortogram;  Surgeon: Wellington Hampshire, MD;  Location: [place] [facility]  [facility];  Service: Cardiovascular;  Laterality: N/A;  . Back surgery    . Appendectomy    . Tonsillectomy    . Breast biopsy Bilateral   . Breast biopsy Right 08/08/2013    Procedure: Evacuation Hematoma Right Chest;  Surgeon: [name], MD;  Location: [place];  Service: General;  Laterality: Right;  . Evacuation breast hematoma  2001; 2015    left; right  . Mastectomy complete / simple Right 08/07/2013  . Mastectomy Left 1970's?     Family History  Problem Relation Age of Onset  . Cancer Mother   . Hypertension Mother      History   Social History  . Marital Status: Widowed    Spouse Name: N/A  . Number of Children: N/A  . Years of  Education: N/A   Occupational History  . Not on file.   Social History Main Topics  . Smoking status: Former Smoker    Types: Cigarettes  . Smokeless tobacco: Never Used     Comment: "stopped smoking in 1969"  . Alcohol Use: 0.0 oz/week    0 Standard drinks or equivalent per week     Comment: "no alcohol since 1969"  . Drug Use: No  . Sexual Activity: No   Other Topics Concern  . Not on file   Social History Narrative     ROS A 10 point review of system was performed. It is negative other than that mentioned in the history of present illness.   PHYSICAL EXAM   BP 80/40 mmHg  Ht 5\' 2"  (1.575 m)  Wt 110 lb (49.896 kg)  BMI 20.11 kg/m2 Constitutional: He is oriented to person, place, and time. He appears well-developed and well-nourished. No distress.  HENT: No nasal discharge.  Head: Normocephalic and atraumatic.  Eyes: Pupils are equal and round.  No discharge. Neck: Normal range of motion. Neck supple. No JVD present. No thyromegaly present.  Cardiovascular: Normal rate, regular rhythm, normal heart sounds. Exam reveals no gallop and no friction rub. No murmur heard.  Pulmonary/Chest: Effort normal and breath sounds normal. No stridor. No respiratory distress. He has no wheezes. He has no rales. He exhibits no tenderness.  Abdominal: Soft. Bowel sounds are normal. He exhibits no distension. There is no tenderness. There is no rebound and no guarding.  Musculoskeletal: Normal range of motion. He exhibits no edema and no tenderness.  Neurological: He is alert and oriented to person, place, and time. Coordination normal.  Skin: Skin is warm and dry. No rash noted. He is not diaphoretic. No erythema. No pallor.  Psychiatric: He has a normal mood and affect. His behavior is normal. Judgment and thought content normal.  Vascular: Femoral pulses are normal. No groin hematoma. Distal pulses are not palpable. There is dry gangrene affecting the left big toe       ASSESSMENT  AND PLAN

## 2014-07-30 NOTE — Patient Instructions (Signed)
Medication Instructions:  Your physician recommends that you continue on your current medications as directed. Please refer to the Current Medication list given to you today.   Labwork: none  Testing/Procedures: none  Follow-Up: Your physician recommends that you schedule a follow-up appointment as needed with Dr. Fletcher Anon

## 2014-07-31 DIAGNOSIS — D509 Iron deficiency anemia, unspecified: Secondary | ICD-10-CM | POA: Diagnosis not present

## 2014-07-31 DIAGNOSIS — D631 Anemia in chronic kidney disease: Secondary | ICD-10-CM | POA: Diagnosis not present

## 2014-07-31 DIAGNOSIS — N2581 Secondary hyperparathyroidism of renal origin: Secondary | ICD-10-CM | POA: Diagnosis not present

## 2014-07-31 DIAGNOSIS — E119 Type 2 diabetes mellitus without complications: Secondary | ICD-10-CM | POA: Diagnosis not present

## 2014-07-31 DIAGNOSIS — N186 End stage renal disease: Secondary | ICD-10-CM | POA: Diagnosis not present

## 2014-08-01 NOTE — Assessment & Plan Note (Signed)
The patient has dry gangrene affecting the left big toe. Recent angiogram showed heavily calcified lower extremity arteries with mild to moderate diffuse disease. However, no high-grade stenosis was identified. The patient  will likely have delayed healing due to her dialysis status with heavily calcified arteries which might impair microvascular circulation. Nonetheless, no revascularization is needed.  The patient can follow-up with me as needed. I asked her to schedule an appointment with Dr. Geroge Baseman. She might require amputation of the left big toe or this might auto amputate.

## 2014-08-02 DIAGNOSIS — E119 Type 2 diabetes mellitus without complications: Secondary | ICD-10-CM | POA: Diagnosis not present

## 2014-08-02 DIAGNOSIS — N2581 Secondary hyperparathyroidism of renal origin: Secondary | ICD-10-CM | POA: Diagnosis not present

## 2014-08-02 DIAGNOSIS — D509 Iron deficiency anemia, unspecified: Secondary | ICD-10-CM | POA: Diagnosis not present

## 2014-08-02 DIAGNOSIS — D631 Anemia in chronic kidney disease: Secondary | ICD-10-CM | POA: Diagnosis not present

## 2014-08-02 DIAGNOSIS — N186 End stage renal disease: Secondary | ICD-10-CM | POA: Diagnosis not present

## 2014-08-05 DIAGNOSIS — D631 Anemia in chronic kidney disease: Secondary | ICD-10-CM | POA: Diagnosis not present

## 2014-08-05 DIAGNOSIS — E119 Type 2 diabetes mellitus without complications: Secondary | ICD-10-CM | POA: Diagnosis not present

## 2014-08-05 DIAGNOSIS — N2581 Secondary hyperparathyroidism of renal origin: Secondary | ICD-10-CM | POA: Diagnosis not present

## 2014-08-05 DIAGNOSIS — D509 Iron deficiency anemia, unspecified: Secondary | ICD-10-CM | POA: Diagnosis not present

## 2014-08-05 DIAGNOSIS — N186 End stage renal disease: Secondary | ICD-10-CM | POA: Diagnosis not present

## 2014-08-06 DIAGNOSIS — N186 End stage renal disease: Secondary | ICD-10-CM | POA: Diagnosis not present

## 2014-08-06 DIAGNOSIS — E1129 Type 2 diabetes mellitus with other diabetic kidney complication: Secondary | ICD-10-CM | POA: Diagnosis not present

## 2014-08-06 DIAGNOSIS — Z992 Dependence on renal dialysis: Secondary | ICD-10-CM | POA: Diagnosis not present

## 2014-08-07 DIAGNOSIS — D509 Iron deficiency anemia, unspecified: Secondary | ICD-10-CM | POA: Diagnosis not present

## 2014-08-07 DIAGNOSIS — N2581 Secondary hyperparathyroidism of renal origin: Secondary | ICD-10-CM | POA: Diagnosis not present

## 2014-08-07 DIAGNOSIS — D631 Anemia in chronic kidney disease: Secondary | ICD-10-CM | POA: Diagnosis not present

## 2014-08-07 DIAGNOSIS — E119 Type 2 diabetes mellitus without complications: Secondary | ICD-10-CM | POA: Diagnosis not present

## 2014-08-07 DIAGNOSIS — N186 End stage renal disease: Secondary | ICD-10-CM | POA: Diagnosis not present

## 2014-08-09 DIAGNOSIS — E119 Type 2 diabetes mellitus without complications: Secondary | ICD-10-CM | POA: Diagnosis not present

## 2014-08-09 DIAGNOSIS — D631 Anemia in chronic kidney disease: Secondary | ICD-10-CM | POA: Diagnosis not present

## 2014-08-09 DIAGNOSIS — D509 Iron deficiency anemia, unspecified: Secondary | ICD-10-CM | POA: Diagnosis not present

## 2014-08-09 DIAGNOSIS — N2581 Secondary hyperparathyroidism of renal origin: Secondary | ICD-10-CM | POA: Diagnosis not present

## 2014-08-09 DIAGNOSIS — N186 End stage renal disease: Secondary | ICD-10-CM | POA: Diagnosis not present

## 2014-08-12 DIAGNOSIS — D509 Iron deficiency anemia, unspecified: Secondary | ICD-10-CM | POA: Diagnosis not present

## 2014-08-12 DIAGNOSIS — N2581 Secondary hyperparathyroidism of renal origin: Secondary | ICD-10-CM | POA: Diagnosis not present

## 2014-08-12 DIAGNOSIS — E119 Type 2 diabetes mellitus without complications: Secondary | ICD-10-CM | POA: Diagnosis not present

## 2014-08-12 DIAGNOSIS — N186 End stage renal disease: Secondary | ICD-10-CM | POA: Diagnosis not present

## 2014-08-12 DIAGNOSIS — D631 Anemia in chronic kidney disease: Secondary | ICD-10-CM | POA: Diagnosis not present

## 2014-08-13 ENCOUNTER — Ambulatory Visit: Payer: Medicare Other | Admitting: Oncology

## 2014-08-14 DIAGNOSIS — N2581 Secondary hyperparathyroidism of renal origin: Secondary | ICD-10-CM | POA: Diagnosis not present

## 2014-08-14 DIAGNOSIS — N186 End stage renal disease: Secondary | ICD-10-CM | POA: Diagnosis not present

## 2014-08-14 DIAGNOSIS — D509 Iron deficiency anemia, unspecified: Secondary | ICD-10-CM | POA: Diagnosis not present

## 2014-08-14 DIAGNOSIS — E119 Type 2 diabetes mellitus without complications: Secondary | ICD-10-CM | POA: Diagnosis not present

## 2014-08-14 DIAGNOSIS — D631 Anemia in chronic kidney disease: Secondary | ICD-10-CM | POA: Diagnosis not present

## 2014-08-15 ENCOUNTER — Ambulatory Visit (HOSPITAL_BASED_OUTPATIENT_CLINIC_OR_DEPARTMENT_OTHER): Payer: Medicare Other | Admitting: Oncology

## 2014-08-15 ENCOUNTER — Telehealth: Payer: Self-pay | Admitting: Oncology

## 2014-08-15 VITALS — BP 100/50 | HR 72 | Temp 98.2°F | Resp 18 | Ht 62.0 in | Wt 110.0 lb

## 2014-08-15 DIAGNOSIS — N189 Chronic kidney disease, unspecified: Secondary | ICD-10-CM

## 2014-08-15 DIAGNOSIS — D631 Anemia in chronic kidney disease: Secondary | ICD-10-CM | POA: Diagnosis not present

## 2014-08-15 DIAGNOSIS — Z79811 Long term (current) use of aromatase inhibitors: Secondary | ICD-10-CM

## 2014-08-15 DIAGNOSIS — C50511 Malignant neoplasm of lower-outer quadrant of right female breast: Secondary | ICD-10-CM | POA: Diagnosis not present

## 2014-08-15 NOTE — Progress Notes (Signed)
Hematology and Oncology Follow Up Visit  [name] [id number] 07-Aug-1932 81 y.o. 08/15/2014 1:37 PM [name], MDSun, Gari Crown, MD   Principle Diagnosis: 81 year old woman with the diagnosis of invasive ductal carcinoma of the right breast diagnosed in April 2015. She presented with a 3.1 cm mass in the right lower breast. Tumor was found to be ER positive, PR negative HER-2 negative. She also has a remote history of left sided breast cancer.  Prior Therapy: She underwent a right total mastectomy under the care of Dr. Dalbert Batman on 08/07/2013. The pathology confirmed the presence of 3.5 cm invasive ductal carcinoma grade I/III with negative surgical margins. She was ER positive PR negative. With Ki-67 of 9%. The pathological staging was pT2 NX.  She is also status post left mastectomy done in 1988.  Current therapy: Arimidex 1 mg daily started in July 2015.  Interim History:  [name] presents today for a follow-up visit. Since the last visit, she was hospitalized briefly for a total amputation and have recovered well. She continues to be hemodialysis-dependent and tolerates it well Monday, Wednesday and Friday. She is reporting no complications from Arimidex including arthralgias, myalgias or GI toxicities.  She does not report any fevers or chills or sweats. Has not reported any weight loss or appetite changes. She does report some chronic back pain which is unchanged. She does have left arm lymphedema that is chronic. She does not report any chest pain shortness of breath cough or hemoptysis. Does not report any palpitation orthopnea or PND. Does not report any nausea or vomiting or abdominal pain. Does not report any frequency urgency or hesitancy. Does not report any worsening skeletal complaints. Does not report any lymphadenopathy or petechiae. Has not reported any other neurological symptoms of headaches or blurry vision. Rest of the review of system is  unremarkable.  Medications: I have reviewed the patient's current medications.  Current Outpatient Prescriptions  Medication Sig Dispense Refill  . amLODipine (NORVASC) 5 MG tablet Take 5 mg by mouth daily.  1  . anastrozole (ARIMIDEX) 1 MG tablet Take 1 tablet (1 mg total) by mouth daily. 30 tablet 6  . benzonatate (TESSALON) 100 MG capsule Take 100 mg by mouth 3 (three) times daily as needed for cough.    Marland Kitchen ibuprofen (ADVIL,MOTRIN) 200 MG tablet Take 200 mg by mouth every 6 (six) hours as needed for fever, headache or mild pain.    Marland Kitchen lanthanum (FOSRENOL) 1000 MG chewable tablet Chew 1,000 mg by mouth 2 (two) times daily with a meal.     . metoprolol tartrate (LOPRESSOR) 25 MG tablet Take 25 mg by mouth 2 (two) times daily.     . povidone-iodine (BETADINE) 10 % ointment Apply 1 application topically as needed for wound care.    . SENSIPAR 60 MG tablet Take 1 tablet by mouth daily.     No current facility-administered medications for this visit.     Allergies:  Allergies  Allergen Reactions  . Aspirin     NO BLOOD THINNERS OF ANY KIND    Past Medical History, Surgical history, Social history, and Family History were reviewed and updated.  Physical Exam: Blood pressure 100/50, pulse 72, temperature 98.2 F (36.8 C), temperature source Oral, resp. rate 18, height _0  (1.575 m), weight 110 lb (49.896 kg), SpO2 100 %. ECOG: 2 General appearance: alert and cooperative. Not in any distress. Head: Normocephalic, without obvious abnormality Neck: no adenopathy Lymph nodes: Cervical, supraclavicular, and axillary nodes normal. Heart:regular  rate and rhythm, S1, S2 normal, no murmur, click, rub or gallop Lung:chest clear, no wheezing, rales, normal symmetric air entry Abdomin: soft, non-tender, without masses or organomegaly EXT:no erythema, induration, or nodules   Lab Results: Lab Results  Component Value Date   WBC 5.6 07/10/2014   HGB 11.9* 07/10/2014   HCT 35.8* 07/10/2014    MCV 94.7 07/10/2014   PLT 182 07/10/2014     Chemistry      Component Value Date/Time   NA 138 07/10/2014 1011   NA 139 09/04/2013 1025   K 4.1 07/10/2014 1011   K 4.0 09/04/2013 1025   CL 92* 07/10/2014 1011   CO2 32 07/10/2014 1011   CO2 31* 09/04/2013 1025   BUN 51* 07/10/2014 1011   BUN 49.9* 09/04/2013 1025   CREATININE 6.43* 07/10/2014 1011   CREATININE 6.2* 09/04/2013 1025      Component Value Date/Time   CALCIUM 10.4* 07/10/2014 1011   CALCIUM 10.4 09/04/2013 1025   ALKPHOS 152* 09/04/2013 1025   ALKPHOS 100 07/26/2013 0938   AST 43* 09/04/2013 1025   AST 40* 07/26/2013 0938   ALT 21 09/04/2013 1025   ALT 26 07/26/2013 0938   BILITOT 0.54 09/04/2013 1025   BILITOT 0.5 07/26/2013 0938       Impression and Plan:   81 year old woman with the following issues:  1. An invasive ductal carcinoma of the right breast after presenting with 3.5 cm mass. She is status post right mastectomy with the pathological staging of T2 Nx completed in June 2015. Her clinical staging is T2 N0 with clinically negative axilla. Her tumor is ER positive HER-2 negative. With low grade features. She is status post left mastectomy for a breast cancer in 1988 and that time she was treated with tamoxifen.  She is currently on Arimidex and tolerated it well since July 2015. The plan to treat her with at least 5 years. This can potentially be extended to 10 years based on recent updates of the MA 17R trial. We will make that decision few years down the road.  2. Renal insufficiency: Is currently hemodialysis-dependent.  3. Anemia: Related to her kidney disease.  Zola Button, MD 6/9/20161:37 PM

## 2014-08-15 NOTE — Telephone Encounter (Signed)
per pof to sch pt appappt-gave pt avs

## 2014-08-16 DIAGNOSIS — D509 Iron deficiency anemia, unspecified: Secondary | ICD-10-CM | POA: Diagnosis not present

## 2014-08-16 DIAGNOSIS — D631 Anemia in chronic kidney disease: Secondary | ICD-10-CM | POA: Diagnosis not present

## 2014-08-16 DIAGNOSIS — N186 End stage renal disease: Secondary | ICD-10-CM | POA: Diagnosis not present

## 2014-08-16 DIAGNOSIS — E119 Type 2 diabetes mellitus without complications: Secondary | ICD-10-CM | POA: Diagnosis not present

## 2014-08-16 DIAGNOSIS — N2581 Secondary hyperparathyroidism of renal origin: Secondary | ICD-10-CM | POA: Diagnosis not present

## 2014-08-19 DIAGNOSIS — D631 Anemia in chronic kidney disease: Secondary | ICD-10-CM | POA: Diagnosis not present

## 2014-08-19 DIAGNOSIS — N186 End stage renal disease: Secondary | ICD-10-CM | POA: Diagnosis not present

## 2014-08-19 DIAGNOSIS — D509 Iron deficiency anemia, unspecified: Secondary | ICD-10-CM | POA: Diagnosis not present

## 2014-08-19 DIAGNOSIS — E119 Type 2 diabetes mellitus without complications: Secondary | ICD-10-CM | POA: Diagnosis not present

## 2014-08-19 DIAGNOSIS — N2581 Secondary hyperparathyroidism of renal origin: Secondary | ICD-10-CM | POA: Diagnosis not present

## 2014-08-21 DIAGNOSIS — D631 Anemia in chronic kidney disease: Secondary | ICD-10-CM | POA: Diagnosis not present

## 2014-08-21 DIAGNOSIS — N186 End stage renal disease: Secondary | ICD-10-CM | POA: Diagnosis not present

## 2014-08-21 DIAGNOSIS — D509 Iron deficiency anemia, unspecified: Secondary | ICD-10-CM | POA: Diagnosis not present

## 2014-08-21 DIAGNOSIS — N2581 Secondary hyperparathyroidism of renal origin: Secondary | ICD-10-CM | POA: Diagnosis not present

## 2014-08-21 DIAGNOSIS — E119 Type 2 diabetes mellitus without complications: Secondary | ICD-10-CM | POA: Diagnosis not present

## 2014-08-23 DIAGNOSIS — E119 Type 2 diabetes mellitus without complications: Secondary | ICD-10-CM | POA: Diagnosis not present

## 2014-08-23 DIAGNOSIS — D509 Iron deficiency anemia, unspecified: Secondary | ICD-10-CM | POA: Diagnosis not present

## 2014-08-23 DIAGNOSIS — N186 End stage renal disease: Secondary | ICD-10-CM | POA: Diagnosis not present

## 2014-08-23 DIAGNOSIS — D631 Anemia in chronic kidney disease: Secondary | ICD-10-CM | POA: Diagnosis not present

## 2014-08-23 DIAGNOSIS — N2581 Secondary hyperparathyroidism of renal origin: Secondary | ICD-10-CM | POA: Diagnosis not present

## 2014-08-26 DIAGNOSIS — D631 Anemia in chronic kidney disease: Secondary | ICD-10-CM | POA: Diagnosis not present

## 2014-08-26 DIAGNOSIS — D509 Iron deficiency anemia, unspecified: Secondary | ICD-10-CM | POA: Diagnosis not present

## 2014-08-26 DIAGNOSIS — N186 End stage renal disease: Secondary | ICD-10-CM | POA: Diagnosis not present

## 2014-08-26 DIAGNOSIS — E119 Type 2 diabetes mellitus without complications: Secondary | ICD-10-CM | POA: Diagnosis not present

## 2014-08-26 DIAGNOSIS — N2581 Secondary hyperparathyroidism of renal origin: Secondary | ICD-10-CM | POA: Diagnosis not present

## 2014-08-27 DIAGNOSIS — L97529 Non-pressure chronic ulcer of other part of left foot with unspecified severity: Secondary | ICD-10-CM | POA: Diagnosis not present

## 2014-08-28 DIAGNOSIS — E119 Type 2 diabetes mellitus without complications: Secondary | ICD-10-CM | POA: Diagnosis not present

## 2014-08-28 DIAGNOSIS — D509 Iron deficiency anemia, unspecified: Secondary | ICD-10-CM | POA: Diagnosis not present

## 2014-08-28 DIAGNOSIS — D631 Anemia in chronic kidney disease: Secondary | ICD-10-CM | POA: Diagnosis not present

## 2014-08-28 DIAGNOSIS — N186 End stage renal disease: Secondary | ICD-10-CM | POA: Diagnosis not present

## 2014-08-28 DIAGNOSIS — N2581 Secondary hyperparathyroidism of renal origin: Secondary | ICD-10-CM | POA: Diagnosis not present

## 2014-08-30 DIAGNOSIS — D631 Anemia in chronic kidney disease: Secondary | ICD-10-CM | POA: Diagnosis not present

## 2014-08-30 DIAGNOSIS — E119 Type 2 diabetes mellitus without complications: Secondary | ICD-10-CM | POA: Diagnosis not present

## 2014-08-30 DIAGNOSIS — N186 End stage renal disease: Secondary | ICD-10-CM | POA: Diagnosis not present

## 2014-08-30 DIAGNOSIS — D509 Iron deficiency anemia, unspecified: Secondary | ICD-10-CM | POA: Diagnosis not present

## 2014-08-30 DIAGNOSIS — N2581 Secondary hyperparathyroidism of renal origin: Secondary | ICD-10-CM | POA: Diagnosis not present

## 2014-09-02 DIAGNOSIS — D631 Anemia in chronic kidney disease: Secondary | ICD-10-CM | POA: Diagnosis not present

## 2014-09-02 DIAGNOSIS — D509 Iron deficiency anemia, unspecified: Secondary | ICD-10-CM | POA: Diagnosis not present

## 2014-09-02 DIAGNOSIS — N186 End stage renal disease: Secondary | ICD-10-CM | POA: Diagnosis not present

## 2014-09-02 DIAGNOSIS — N2581 Secondary hyperparathyroidism of renal origin: Secondary | ICD-10-CM | POA: Diagnosis not present

## 2014-09-02 DIAGNOSIS — E119 Type 2 diabetes mellitus without complications: Secondary | ICD-10-CM | POA: Diagnosis not present

## 2014-09-04 DIAGNOSIS — N186 End stage renal disease: Secondary | ICD-10-CM | POA: Diagnosis not present

## 2014-09-04 DIAGNOSIS — D631 Anemia in chronic kidney disease: Secondary | ICD-10-CM | POA: Diagnosis not present

## 2014-09-04 DIAGNOSIS — N2581 Secondary hyperparathyroidism of renal origin: Secondary | ICD-10-CM | POA: Diagnosis not present

## 2014-09-04 DIAGNOSIS — E119 Type 2 diabetes mellitus without complications: Secondary | ICD-10-CM | POA: Diagnosis not present

## 2014-09-04 DIAGNOSIS — D509 Iron deficiency anemia, unspecified: Secondary | ICD-10-CM | POA: Diagnosis not present

## 2014-09-05 DIAGNOSIS — E1129 Type 2 diabetes mellitus with other diabetic kidney complication: Secondary | ICD-10-CM | POA: Diagnosis not present

## 2014-09-05 DIAGNOSIS — N186 End stage renal disease: Secondary | ICD-10-CM | POA: Diagnosis not present

## 2014-09-05 DIAGNOSIS — Z992 Dependence on renal dialysis: Secondary | ICD-10-CM | POA: Diagnosis not present

## 2014-09-06 DIAGNOSIS — D509 Iron deficiency anemia, unspecified: Secondary | ICD-10-CM | POA: Diagnosis not present

## 2014-09-06 DIAGNOSIS — N2581 Secondary hyperparathyroidism of renal origin: Secondary | ICD-10-CM | POA: Diagnosis not present

## 2014-09-06 DIAGNOSIS — E119 Type 2 diabetes mellitus without complications: Secondary | ICD-10-CM | POA: Diagnosis not present

## 2014-09-06 DIAGNOSIS — D631 Anemia in chronic kidney disease: Secondary | ICD-10-CM | POA: Diagnosis not present

## 2014-09-06 DIAGNOSIS — N186 End stage renal disease: Secondary | ICD-10-CM | POA: Diagnosis not present

## 2014-09-09 DIAGNOSIS — E119 Type 2 diabetes mellitus without complications: Secondary | ICD-10-CM | POA: Diagnosis not present

## 2014-09-09 DIAGNOSIS — N186 End stage renal disease: Secondary | ICD-10-CM | POA: Diagnosis not present

## 2014-09-09 DIAGNOSIS — D509 Iron deficiency anemia, unspecified: Secondary | ICD-10-CM | POA: Diagnosis not present

## 2014-09-09 DIAGNOSIS — D631 Anemia in chronic kidney disease: Secondary | ICD-10-CM | POA: Diagnosis not present

## 2014-09-09 DIAGNOSIS — N2581 Secondary hyperparathyroidism of renal origin: Secondary | ICD-10-CM | POA: Diagnosis not present

## 2014-09-11 DIAGNOSIS — D631 Anemia in chronic kidney disease: Secondary | ICD-10-CM | POA: Diagnosis not present

## 2014-09-11 DIAGNOSIS — N186 End stage renal disease: Secondary | ICD-10-CM | POA: Diagnosis not present

## 2014-09-11 DIAGNOSIS — E119 Type 2 diabetes mellitus without complications: Secondary | ICD-10-CM | POA: Diagnosis not present

## 2014-09-11 DIAGNOSIS — N2581 Secondary hyperparathyroidism of renal origin: Secondary | ICD-10-CM | POA: Diagnosis not present

## 2014-09-11 DIAGNOSIS — D509 Iron deficiency anemia, unspecified: Secondary | ICD-10-CM | POA: Diagnosis not present

## 2014-09-13 DIAGNOSIS — E119 Type 2 diabetes mellitus without complications: Secondary | ICD-10-CM | POA: Diagnosis not present

## 2014-09-13 DIAGNOSIS — D509 Iron deficiency anemia, unspecified: Secondary | ICD-10-CM | POA: Diagnosis not present

## 2014-09-13 DIAGNOSIS — D631 Anemia in chronic kidney disease: Secondary | ICD-10-CM | POA: Diagnosis not present

## 2014-09-13 DIAGNOSIS — N2581 Secondary hyperparathyroidism of renal origin: Secondary | ICD-10-CM | POA: Diagnosis not present

## 2014-09-13 DIAGNOSIS — N186 End stage renal disease: Secondary | ICD-10-CM | POA: Diagnosis not present

## 2014-09-16 DIAGNOSIS — N186 End stage renal disease: Secondary | ICD-10-CM | POA: Diagnosis not present

## 2014-09-16 DIAGNOSIS — E119 Type 2 diabetes mellitus without complications: Secondary | ICD-10-CM | POA: Diagnosis not present

## 2014-09-16 DIAGNOSIS — N2581 Secondary hyperparathyroidism of renal origin: Secondary | ICD-10-CM | POA: Diagnosis not present

## 2014-09-16 DIAGNOSIS — D631 Anemia in chronic kidney disease: Secondary | ICD-10-CM | POA: Diagnosis not present

## 2014-09-16 DIAGNOSIS — D509 Iron deficiency anemia, unspecified: Secondary | ICD-10-CM | POA: Diagnosis not present

## 2014-09-18 DIAGNOSIS — D509 Iron deficiency anemia, unspecified: Secondary | ICD-10-CM | POA: Diagnosis not present

## 2014-09-18 DIAGNOSIS — N2581 Secondary hyperparathyroidism of renal origin: Secondary | ICD-10-CM | POA: Diagnosis not present

## 2014-09-18 DIAGNOSIS — N186 End stage renal disease: Secondary | ICD-10-CM | POA: Diagnosis not present

## 2014-09-18 DIAGNOSIS — D631 Anemia in chronic kidney disease: Secondary | ICD-10-CM | POA: Diagnosis not present

## 2014-09-18 DIAGNOSIS — E119 Type 2 diabetes mellitus without complications: Secondary | ICD-10-CM | POA: Diagnosis not present

## 2014-09-20 DIAGNOSIS — N186 End stage renal disease: Secondary | ICD-10-CM | POA: Diagnosis not present

## 2014-09-20 DIAGNOSIS — D631 Anemia in chronic kidney disease: Secondary | ICD-10-CM | POA: Diagnosis not present

## 2014-09-20 DIAGNOSIS — D509 Iron deficiency anemia, unspecified: Secondary | ICD-10-CM | POA: Diagnosis not present

## 2014-09-20 DIAGNOSIS — N2581 Secondary hyperparathyroidism of renal origin: Secondary | ICD-10-CM | POA: Diagnosis not present

## 2014-09-20 DIAGNOSIS — E119 Type 2 diabetes mellitus without complications: Secondary | ICD-10-CM | POA: Diagnosis not present

## 2014-09-23 DIAGNOSIS — D631 Anemia in chronic kidney disease: Secondary | ICD-10-CM | POA: Diagnosis not present

## 2014-09-23 DIAGNOSIS — N2581 Secondary hyperparathyroidism of renal origin: Secondary | ICD-10-CM | POA: Diagnosis not present

## 2014-09-23 DIAGNOSIS — N186 End stage renal disease: Secondary | ICD-10-CM | POA: Diagnosis not present

## 2014-09-23 DIAGNOSIS — E119 Type 2 diabetes mellitus without complications: Secondary | ICD-10-CM | POA: Diagnosis not present

## 2014-09-23 DIAGNOSIS — D509 Iron deficiency anemia, unspecified: Secondary | ICD-10-CM | POA: Diagnosis not present

## 2014-09-25 DIAGNOSIS — E119 Type 2 diabetes mellitus without complications: Secondary | ICD-10-CM | POA: Diagnosis not present

## 2014-09-25 DIAGNOSIS — N186 End stage renal disease: Secondary | ICD-10-CM | POA: Diagnosis not present

## 2014-09-25 DIAGNOSIS — D509 Iron deficiency anemia, unspecified: Secondary | ICD-10-CM | POA: Diagnosis not present

## 2014-09-25 DIAGNOSIS — N2581 Secondary hyperparathyroidism of renal origin: Secondary | ICD-10-CM | POA: Diagnosis not present

## 2014-09-25 DIAGNOSIS — D631 Anemia in chronic kidney disease: Secondary | ICD-10-CM | POA: Diagnosis not present

## 2014-09-27 DIAGNOSIS — E119 Type 2 diabetes mellitus without complications: Secondary | ICD-10-CM | POA: Diagnosis not present

## 2014-09-27 DIAGNOSIS — D509 Iron deficiency anemia, unspecified: Secondary | ICD-10-CM | POA: Diagnosis not present

## 2014-09-27 DIAGNOSIS — N2581 Secondary hyperparathyroidism of renal origin: Secondary | ICD-10-CM | POA: Diagnosis not present

## 2014-09-27 DIAGNOSIS — D631 Anemia in chronic kidney disease: Secondary | ICD-10-CM | POA: Diagnosis not present

## 2014-09-27 DIAGNOSIS — N186 End stage renal disease: Secondary | ICD-10-CM | POA: Diagnosis not present

## 2014-09-30 DIAGNOSIS — D631 Anemia in chronic kidney disease: Secondary | ICD-10-CM | POA: Diagnosis not present

## 2014-09-30 DIAGNOSIS — N2581 Secondary hyperparathyroidism of renal origin: Secondary | ICD-10-CM | POA: Diagnosis not present

## 2014-09-30 DIAGNOSIS — D509 Iron deficiency anemia, unspecified: Secondary | ICD-10-CM | POA: Diagnosis not present

## 2014-09-30 DIAGNOSIS — N186 End stage renal disease: Secondary | ICD-10-CM | POA: Diagnosis not present

## 2014-09-30 DIAGNOSIS — E119 Type 2 diabetes mellitus without complications: Secondary | ICD-10-CM | POA: Diagnosis not present

## 2014-10-02 DIAGNOSIS — N2581 Secondary hyperparathyroidism of renal origin: Secondary | ICD-10-CM | POA: Diagnosis not present

## 2014-10-02 DIAGNOSIS — D631 Anemia in chronic kidney disease: Secondary | ICD-10-CM | POA: Diagnosis not present

## 2014-10-02 DIAGNOSIS — E119 Type 2 diabetes mellitus without complications: Secondary | ICD-10-CM | POA: Diagnosis not present

## 2014-10-02 DIAGNOSIS — N186 End stage renal disease: Secondary | ICD-10-CM | POA: Diagnosis not present

## 2014-10-02 DIAGNOSIS — E1129 Type 2 diabetes mellitus with other diabetic kidney complication: Secondary | ICD-10-CM | POA: Diagnosis not present

## 2014-10-02 DIAGNOSIS — D509 Iron deficiency anemia, unspecified: Secondary | ICD-10-CM | POA: Diagnosis not present

## 2014-10-04 DIAGNOSIS — E119 Type 2 diabetes mellitus without complications: Secondary | ICD-10-CM | POA: Diagnosis not present

## 2014-10-04 DIAGNOSIS — D631 Anemia in chronic kidney disease: Secondary | ICD-10-CM | POA: Diagnosis not present

## 2014-10-04 DIAGNOSIS — N2581 Secondary hyperparathyroidism of renal origin: Secondary | ICD-10-CM | POA: Diagnosis not present

## 2014-10-04 DIAGNOSIS — N186 End stage renal disease: Secondary | ICD-10-CM | POA: Diagnosis not present

## 2014-10-04 DIAGNOSIS — D509 Iron deficiency anemia, unspecified: Secondary | ICD-10-CM | POA: Diagnosis not present

## 2014-10-06 DIAGNOSIS — Z992 Dependence on renal dialysis: Secondary | ICD-10-CM | POA: Diagnosis not present

## 2014-10-06 DIAGNOSIS — N186 End stage renal disease: Secondary | ICD-10-CM | POA: Diagnosis not present

## 2014-10-06 DIAGNOSIS — E1129 Type 2 diabetes mellitus with other diabetic kidney complication: Secondary | ICD-10-CM | POA: Diagnosis not present

## 2014-10-07 DIAGNOSIS — E119 Type 2 diabetes mellitus without complications: Secondary | ICD-10-CM | POA: Diagnosis not present

## 2014-10-07 DIAGNOSIS — D631 Anemia in chronic kidney disease: Secondary | ICD-10-CM | POA: Diagnosis not present

## 2014-10-07 DIAGNOSIS — N2581 Secondary hyperparathyroidism of renal origin: Secondary | ICD-10-CM | POA: Diagnosis not present

## 2014-10-07 DIAGNOSIS — D509 Iron deficiency anemia, unspecified: Secondary | ICD-10-CM | POA: Diagnosis not present

## 2014-10-07 DIAGNOSIS — N186 End stage renal disease: Secondary | ICD-10-CM | POA: Diagnosis not present

## 2014-10-09 DIAGNOSIS — E119 Type 2 diabetes mellitus without complications: Secondary | ICD-10-CM | POA: Diagnosis not present

## 2014-10-09 DIAGNOSIS — D631 Anemia in chronic kidney disease: Secondary | ICD-10-CM | POA: Diagnosis not present

## 2014-10-09 DIAGNOSIS — N186 End stage renal disease: Secondary | ICD-10-CM | POA: Diagnosis not present

## 2014-10-09 DIAGNOSIS — N2581 Secondary hyperparathyroidism of renal origin: Secondary | ICD-10-CM | POA: Diagnosis not present

## 2014-10-09 DIAGNOSIS — D509 Iron deficiency anemia, unspecified: Secondary | ICD-10-CM | POA: Diagnosis not present

## 2014-10-11 DIAGNOSIS — D509 Iron deficiency anemia, unspecified: Secondary | ICD-10-CM | POA: Diagnosis not present

## 2014-10-11 DIAGNOSIS — N2581 Secondary hyperparathyroidism of renal origin: Secondary | ICD-10-CM | POA: Diagnosis not present

## 2014-10-11 DIAGNOSIS — N186 End stage renal disease: Secondary | ICD-10-CM | POA: Diagnosis not present

## 2014-10-11 DIAGNOSIS — E119 Type 2 diabetes mellitus without complications: Secondary | ICD-10-CM | POA: Diagnosis not present

## 2014-10-11 DIAGNOSIS — D631 Anemia in chronic kidney disease: Secondary | ICD-10-CM | POA: Diagnosis not present

## 2014-10-14 DIAGNOSIS — N2581 Secondary hyperparathyroidism of renal origin: Secondary | ICD-10-CM | POA: Diagnosis not present

## 2014-10-14 DIAGNOSIS — E119 Type 2 diabetes mellitus without complications: Secondary | ICD-10-CM | POA: Diagnosis not present

## 2014-10-14 DIAGNOSIS — D631 Anemia in chronic kidney disease: Secondary | ICD-10-CM | POA: Diagnosis not present

## 2014-10-14 DIAGNOSIS — N186 End stage renal disease: Secondary | ICD-10-CM | POA: Diagnosis not present

## 2014-10-14 DIAGNOSIS — D509 Iron deficiency anemia, unspecified: Secondary | ICD-10-CM | POA: Diagnosis not present

## 2014-10-16 DIAGNOSIS — N186 End stage renal disease: Secondary | ICD-10-CM | POA: Diagnosis not present

## 2014-10-16 DIAGNOSIS — D631 Anemia in chronic kidney disease: Secondary | ICD-10-CM | POA: Diagnosis not present

## 2014-10-16 DIAGNOSIS — D509 Iron deficiency anemia, unspecified: Secondary | ICD-10-CM | POA: Diagnosis not present

## 2014-10-16 DIAGNOSIS — N2581 Secondary hyperparathyroidism of renal origin: Secondary | ICD-10-CM | POA: Diagnosis not present

## 2014-10-16 DIAGNOSIS — E119 Type 2 diabetes mellitus without complications: Secondary | ICD-10-CM | POA: Diagnosis not present

## 2014-10-18 DIAGNOSIS — E119 Type 2 diabetes mellitus without complications: Secondary | ICD-10-CM | POA: Diagnosis not present

## 2014-10-18 DIAGNOSIS — N2581 Secondary hyperparathyroidism of renal origin: Secondary | ICD-10-CM | POA: Diagnosis not present

## 2014-10-18 DIAGNOSIS — D631 Anemia in chronic kidney disease: Secondary | ICD-10-CM | POA: Diagnosis not present

## 2014-10-18 DIAGNOSIS — N186 End stage renal disease: Secondary | ICD-10-CM | POA: Diagnosis not present

## 2014-10-18 DIAGNOSIS — D509 Iron deficiency anemia, unspecified: Secondary | ICD-10-CM | POA: Diagnosis not present

## 2014-10-21 DIAGNOSIS — E119 Type 2 diabetes mellitus without complications: Secondary | ICD-10-CM | POA: Diagnosis not present

## 2014-10-21 DIAGNOSIS — N186 End stage renal disease: Secondary | ICD-10-CM | POA: Diagnosis not present

## 2014-10-21 DIAGNOSIS — D509 Iron deficiency anemia, unspecified: Secondary | ICD-10-CM | POA: Diagnosis not present

## 2014-10-21 DIAGNOSIS — N2581 Secondary hyperparathyroidism of renal origin: Secondary | ICD-10-CM | POA: Diagnosis not present

## 2014-10-21 DIAGNOSIS — D631 Anemia in chronic kidney disease: Secondary | ICD-10-CM | POA: Diagnosis not present

## 2014-10-23 DIAGNOSIS — N2581 Secondary hyperparathyroidism of renal origin: Secondary | ICD-10-CM | POA: Diagnosis not present

## 2014-10-23 DIAGNOSIS — D509 Iron deficiency anemia, unspecified: Secondary | ICD-10-CM | POA: Diagnosis not present

## 2014-10-23 DIAGNOSIS — E119 Type 2 diabetes mellitus without complications: Secondary | ICD-10-CM | POA: Diagnosis not present

## 2014-10-23 DIAGNOSIS — N186 End stage renal disease: Secondary | ICD-10-CM | POA: Diagnosis not present

## 2014-10-23 DIAGNOSIS — D631 Anemia in chronic kidney disease: Secondary | ICD-10-CM | POA: Diagnosis not present

## 2014-10-25 DIAGNOSIS — E119 Type 2 diabetes mellitus without complications: Secondary | ICD-10-CM | POA: Diagnosis not present

## 2014-10-25 DIAGNOSIS — D509 Iron deficiency anemia, unspecified: Secondary | ICD-10-CM | POA: Diagnosis not present

## 2014-10-25 DIAGNOSIS — D631 Anemia in chronic kidney disease: Secondary | ICD-10-CM | POA: Diagnosis not present

## 2014-10-25 DIAGNOSIS — N2581 Secondary hyperparathyroidism of renal origin: Secondary | ICD-10-CM | POA: Diagnosis not present

## 2014-10-25 DIAGNOSIS — N186 End stage renal disease: Secondary | ICD-10-CM | POA: Diagnosis not present

## 2014-10-28 DIAGNOSIS — D509 Iron deficiency anemia, unspecified: Secondary | ICD-10-CM | POA: Diagnosis not present

## 2014-10-28 DIAGNOSIS — N186 End stage renal disease: Secondary | ICD-10-CM | POA: Diagnosis not present

## 2014-10-28 DIAGNOSIS — D631 Anemia in chronic kidney disease: Secondary | ICD-10-CM | POA: Diagnosis not present

## 2014-10-28 DIAGNOSIS — E119 Type 2 diabetes mellitus without complications: Secondary | ICD-10-CM | POA: Diagnosis not present

## 2014-10-28 DIAGNOSIS — N2581 Secondary hyperparathyroidism of renal origin: Secondary | ICD-10-CM | POA: Diagnosis not present

## 2014-10-30 DIAGNOSIS — D509 Iron deficiency anemia, unspecified: Secondary | ICD-10-CM | POA: Diagnosis not present

## 2014-10-30 DIAGNOSIS — N2581 Secondary hyperparathyroidism of renal origin: Secondary | ICD-10-CM | POA: Diagnosis not present

## 2014-10-30 DIAGNOSIS — E119 Type 2 diabetes mellitus without complications: Secondary | ICD-10-CM | POA: Diagnosis not present

## 2014-10-30 DIAGNOSIS — D631 Anemia in chronic kidney disease: Secondary | ICD-10-CM | POA: Diagnosis not present

## 2014-10-30 DIAGNOSIS — N186 End stage renal disease: Secondary | ICD-10-CM | POA: Diagnosis not present

## 2014-11-01 DIAGNOSIS — N186 End stage renal disease: Secondary | ICD-10-CM | POA: Diagnosis not present

## 2014-11-01 DIAGNOSIS — D631 Anemia in chronic kidney disease: Secondary | ICD-10-CM | POA: Diagnosis not present

## 2014-11-01 DIAGNOSIS — N2581 Secondary hyperparathyroidism of renal origin: Secondary | ICD-10-CM | POA: Diagnosis not present

## 2014-11-01 DIAGNOSIS — D509 Iron deficiency anemia, unspecified: Secondary | ICD-10-CM | POA: Diagnosis not present

## 2014-11-01 DIAGNOSIS — E119 Type 2 diabetes mellitus without complications: Secondary | ICD-10-CM | POA: Diagnosis not present

## 2014-11-04 DIAGNOSIS — N2581 Secondary hyperparathyroidism of renal origin: Secondary | ICD-10-CM | POA: Diagnosis not present

## 2014-11-04 DIAGNOSIS — N186 End stage renal disease: Secondary | ICD-10-CM | POA: Diagnosis not present

## 2014-11-04 DIAGNOSIS — E119 Type 2 diabetes mellitus without complications: Secondary | ICD-10-CM | POA: Diagnosis not present

## 2014-11-04 DIAGNOSIS — D631 Anemia in chronic kidney disease: Secondary | ICD-10-CM | POA: Diagnosis not present

## 2014-11-04 DIAGNOSIS — D509 Iron deficiency anemia, unspecified: Secondary | ICD-10-CM | POA: Diagnosis not present

## 2014-11-06 DIAGNOSIS — D509 Iron deficiency anemia, unspecified: Secondary | ICD-10-CM | POA: Diagnosis not present

## 2014-11-06 DIAGNOSIS — D631 Anemia in chronic kidney disease: Secondary | ICD-10-CM | POA: Diagnosis not present

## 2014-11-06 DIAGNOSIS — Z992 Dependence on renal dialysis: Secondary | ICD-10-CM | POA: Diagnosis not present

## 2014-11-06 DIAGNOSIS — N186 End stage renal disease: Secondary | ICD-10-CM | POA: Diagnosis not present

## 2014-11-06 DIAGNOSIS — E119 Type 2 diabetes mellitus without complications: Secondary | ICD-10-CM | POA: Diagnosis not present

## 2014-11-06 DIAGNOSIS — E1129 Type 2 diabetes mellitus with other diabetic kidney complication: Secondary | ICD-10-CM | POA: Diagnosis not present

## 2014-11-06 DIAGNOSIS — N2581 Secondary hyperparathyroidism of renal origin: Secondary | ICD-10-CM | POA: Diagnosis not present

## 2014-11-08 DIAGNOSIS — D509 Iron deficiency anemia, unspecified: Secondary | ICD-10-CM | POA: Diagnosis not present

## 2014-11-08 DIAGNOSIS — N186 End stage renal disease: Secondary | ICD-10-CM | POA: Diagnosis not present

## 2014-11-08 DIAGNOSIS — D631 Anemia in chronic kidney disease: Secondary | ICD-10-CM | POA: Diagnosis not present

## 2014-11-08 DIAGNOSIS — N2581 Secondary hyperparathyroidism of renal origin: Secondary | ICD-10-CM | POA: Diagnosis not present

## 2014-11-08 DIAGNOSIS — E119 Type 2 diabetes mellitus without complications: Secondary | ICD-10-CM | POA: Diagnosis not present

## 2014-11-08 DIAGNOSIS — E876 Hypokalemia: Secondary | ICD-10-CM | POA: Diagnosis not present

## 2014-11-11 DIAGNOSIS — N2581 Secondary hyperparathyroidism of renal origin: Secondary | ICD-10-CM | POA: Diagnosis not present

## 2014-11-11 DIAGNOSIS — E876 Hypokalemia: Secondary | ICD-10-CM | POA: Diagnosis not present

## 2014-11-11 DIAGNOSIS — E119 Type 2 diabetes mellitus without complications: Secondary | ICD-10-CM | POA: Diagnosis not present

## 2014-11-11 DIAGNOSIS — D631 Anemia in chronic kidney disease: Secondary | ICD-10-CM | POA: Diagnosis not present

## 2014-11-11 DIAGNOSIS — N186 End stage renal disease: Secondary | ICD-10-CM | POA: Diagnosis not present

## 2014-11-11 DIAGNOSIS — D509 Iron deficiency anemia, unspecified: Secondary | ICD-10-CM | POA: Diagnosis not present

## 2014-11-13 DIAGNOSIS — D631 Anemia in chronic kidney disease: Secondary | ICD-10-CM | POA: Diagnosis not present

## 2014-11-13 DIAGNOSIS — N2581 Secondary hyperparathyroidism of renal origin: Secondary | ICD-10-CM | POA: Diagnosis not present

## 2014-11-13 DIAGNOSIS — D509 Iron deficiency anemia, unspecified: Secondary | ICD-10-CM | POA: Diagnosis not present

## 2014-11-13 DIAGNOSIS — E876 Hypokalemia: Secondary | ICD-10-CM | POA: Diagnosis not present

## 2014-11-13 DIAGNOSIS — E119 Type 2 diabetes mellitus without complications: Secondary | ICD-10-CM | POA: Diagnosis not present

## 2014-11-13 DIAGNOSIS — N186 End stage renal disease: Secondary | ICD-10-CM | POA: Diagnosis not present

## 2014-11-15 DIAGNOSIS — E876 Hypokalemia: Secondary | ICD-10-CM | POA: Diagnosis not present

## 2014-11-15 DIAGNOSIS — N2581 Secondary hyperparathyroidism of renal origin: Secondary | ICD-10-CM | POA: Diagnosis not present

## 2014-11-15 DIAGNOSIS — E119 Type 2 diabetes mellitus without complications: Secondary | ICD-10-CM | POA: Diagnosis not present

## 2014-11-15 DIAGNOSIS — D631 Anemia in chronic kidney disease: Secondary | ICD-10-CM | POA: Diagnosis not present

## 2014-11-15 DIAGNOSIS — D509 Iron deficiency anemia, unspecified: Secondary | ICD-10-CM | POA: Diagnosis not present

## 2014-11-15 DIAGNOSIS — N186 End stage renal disease: Secondary | ICD-10-CM | POA: Diagnosis not present

## 2014-11-18 DIAGNOSIS — E876 Hypokalemia: Secondary | ICD-10-CM | POA: Diagnosis not present

## 2014-11-18 DIAGNOSIS — E119 Type 2 diabetes mellitus without complications: Secondary | ICD-10-CM | POA: Diagnosis not present

## 2014-11-18 DIAGNOSIS — D631 Anemia in chronic kidney disease: Secondary | ICD-10-CM | POA: Diagnosis not present

## 2014-11-18 DIAGNOSIS — N2581 Secondary hyperparathyroidism of renal origin: Secondary | ICD-10-CM | POA: Diagnosis not present

## 2014-11-18 DIAGNOSIS — D509 Iron deficiency anemia, unspecified: Secondary | ICD-10-CM | POA: Diagnosis not present

## 2014-11-18 DIAGNOSIS — N186 End stage renal disease: Secondary | ICD-10-CM | POA: Diagnosis not present

## 2014-11-20 DIAGNOSIS — D509 Iron deficiency anemia, unspecified: Secondary | ICD-10-CM | POA: Diagnosis not present

## 2014-11-20 DIAGNOSIS — E876 Hypokalemia: Secondary | ICD-10-CM | POA: Diagnosis not present

## 2014-11-20 DIAGNOSIS — N186 End stage renal disease: Secondary | ICD-10-CM | POA: Diagnosis not present

## 2014-11-20 DIAGNOSIS — E119 Type 2 diabetes mellitus without complications: Secondary | ICD-10-CM | POA: Diagnosis not present

## 2014-11-20 DIAGNOSIS — N2581 Secondary hyperparathyroidism of renal origin: Secondary | ICD-10-CM | POA: Diagnosis not present

## 2014-11-20 DIAGNOSIS — D631 Anemia in chronic kidney disease: Secondary | ICD-10-CM | POA: Diagnosis not present

## 2014-11-22 DIAGNOSIS — D631 Anemia in chronic kidney disease: Secondary | ICD-10-CM | POA: Diagnosis not present

## 2014-11-22 DIAGNOSIS — E119 Type 2 diabetes mellitus without complications: Secondary | ICD-10-CM | POA: Diagnosis not present

## 2014-11-22 DIAGNOSIS — N186 End stage renal disease: Secondary | ICD-10-CM | POA: Diagnosis not present

## 2014-11-22 DIAGNOSIS — N2581 Secondary hyperparathyroidism of renal origin: Secondary | ICD-10-CM | POA: Diagnosis not present

## 2014-11-22 DIAGNOSIS — E876 Hypokalemia: Secondary | ICD-10-CM | POA: Diagnosis not present

## 2014-11-22 DIAGNOSIS — D509 Iron deficiency anemia, unspecified: Secondary | ICD-10-CM | POA: Diagnosis not present

## 2014-11-25 DIAGNOSIS — N2581 Secondary hyperparathyroidism of renal origin: Secondary | ICD-10-CM | POA: Diagnosis not present

## 2014-11-25 DIAGNOSIS — E119 Type 2 diabetes mellitus without complications: Secondary | ICD-10-CM | POA: Diagnosis not present

## 2014-11-25 DIAGNOSIS — D631 Anemia in chronic kidney disease: Secondary | ICD-10-CM | POA: Diagnosis not present

## 2014-11-25 DIAGNOSIS — E876 Hypokalemia: Secondary | ICD-10-CM | POA: Diagnosis not present

## 2014-11-25 DIAGNOSIS — N186 End stage renal disease: Secondary | ICD-10-CM | POA: Diagnosis not present

## 2014-11-25 DIAGNOSIS — D509 Iron deficiency anemia, unspecified: Secondary | ICD-10-CM | POA: Diagnosis not present

## 2014-11-27 DIAGNOSIS — D631 Anemia in chronic kidney disease: Secondary | ICD-10-CM | POA: Diagnosis not present

## 2014-11-27 DIAGNOSIS — D509 Iron deficiency anemia, unspecified: Secondary | ICD-10-CM | POA: Diagnosis not present

## 2014-11-27 DIAGNOSIS — N186 End stage renal disease: Secondary | ICD-10-CM | POA: Diagnosis not present

## 2014-11-27 DIAGNOSIS — E119 Type 2 diabetes mellitus without complications: Secondary | ICD-10-CM | POA: Diagnosis not present

## 2014-11-27 DIAGNOSIS — E876 Hypokalemia: Secondary | ICD-10-CM | POA: Diagnosis not present

## 2014-11-27 DIAGNOSIS — N2581 Secondary hyperparathyroidism of renal origin: Secondary | ICD-10-CM | POA: Diagnosis not present

## 2014-11-29 DIAGNOSIS — D509 Iron deficiency anemia, unspecified: Secondary | ICD-10-CM | POA: Diagnosis not present

## 2014-11-29 DIAGNOSIS — E119 Type 2 diabetes mellitus without complications: Secondary | ICD-10-CM | POA: Diagnosis not present

## 2014-11-29 DIAGNOSIS — N2581 Secondary hyperparathyroidism of renal origin: Secondary | ICD-10-CM | POA: Diagnosis not present

## 2014-11-29 DIAGNOSIS — N186 End stage renal disease: Secondary | ICD-10-CM | POA: Diagnosis not present

## 2014-11-29 DIAGNOSIS — E876 Hypokalemia: Secondary | ICD-10-CM | POA: Diagnosis not present

## 2014-11-29 DIAGNOSIS — D631 Anemia in chronic kidney disease: Secondary | ICD-10-CM | POA: Diagnosis not present

## 2014-12-02 DIAGNOSIS — D631 Anemia in chronic kidney disease: Secondary | ICD-10-CM | POA: Diagnosis not present

## 2014-12-02 DIAGNOSIS — N2581 Secondary hyperparathyroidism of renal origin: Secondary | ICD-10-CM | POA: Diagnosis not present

## 2014-12-02 DIAGNOSIS — E119 Type 2 diabetes mellitus without complications: Secondary | ICD-10-CM | POA: Diagnosis not present

## 2014-12-02 DIAGNOSIS — E876 Hypokalemia: Secondary | ICD-10-CM | POA: Diagnosis not present

## 2014-12-02 DIAGNOSIS — D509 Iron deficiency anemia, unspecified: Secondary | ICD-10-CM | POA: Diagnosis not present

## 2014-12-02 DIAGNOSIS — N186 End stage renal disease: Secondary | ICD-10-CM | POA: Diagnosis not present

## 2014-12-04 DIAGNOSIS — E876 Hypokalemia: Secondary | ICD-10-CM | POA: Diagnosis not present

## 2014-12-04 DIAGNOSIS — D509 Iron deficiency anemia, unspecified: Secondary | ICD-10-CM | POA: Diagnosis not present

## 2014-12-04 DIAGNOSIS — E119 Type 2 diabetes mellitus without complications: Secondary | ICD-10-CM | POA: Diagnosis not present

## 2014-12-04 DIAGNOSIS — N186 End stage renal disease: Secondary | ICD-10-CM | POA: Diagnosis not present

## 2014-12-04 DIAGNOSIS — D631 Anemia in chronic kidney disease: Secondary | ICD-10-CM | POA: Diagnosis not present

## 2014-12-04 DIAGNOSIS — N2581 Secondary hyperparathyroidism of renal origin: Secondary | ICD-10-CM | POA: Diagnosis not present

## 2014-12-06 DIAGNOSIS — E876 Hypokalemia: Secondary | ICD-10-CM | POA: Diagnosis not present

## 2014-12-06 DIAGNOSIS — N186 End stage renal disease: Secondary | ICD-10-CM | POA: Diagnosis not present

## 2014-12-06 DIAGNOSIS — N2581 Secondary hyperparathyroidism of renal origin: Secondary | ICD-10-CM | POA: Diagnosis not present

## 2014-12-06 DIAGNOSIS — Z992 Dependence on renal dialysis: Secondary | ICD-10-CM | POA: Diagnosis not present

## 2014-12-06 DIAGNOSIS — D631 Anemia in chronic kidney disease: Secondary | ICD-10-CM | POA: Diagnosis not present

## 2014-12-06 DIAGNOSIS — D509 Iron deficiency anemia, unspecified: Secondary | ICD-10-CM | POA: Diagnosis not present

## 2014-12-06 DIAGNOSIS — E1129 Type 2 diabetes mellitus with other diabetic kidney complication: Secondary | ICD-10-CM | POA: Diagnosis not present

## 2014-12-06 DIAGNOSIS — E119 Type 2 diabetes mellitus without complications: Secondary | ICD-10-CM | POA: Diagnosis not present

## 2014-12-09 DIAGNOSIS — Z23 Encounter for immunization: Secondary | ICD-10-CM | POA: Diagnosis not present

## 2014-12-09 DIAGNOSIS — N186 End stage renal disease: Secondary | ICD-10-CM | POA: Diagnosis not present

## 2014-12-09 DIAGNOSIS — N2581 Secondary hyperparathyroidism of renal origin: Secondary | ICD-10-CM | POA: Diagnosis not present

## 2014-12-09 DIAGNOSIS — D631 Anemia in chronic kidney disease: Secondary | ICD-10-CM | POA: Diagnosis not present

## 2014-12-09 DIAGNOSIS — E119 Type 2 diabetes mellitus without complications: Secondary | ICD-10-CM | POA: Diagnosis not present

## 2014-12-09 DIAGNOSIS — D509 Iron deficiency anemia, unspecified: Secondary | ICD-10-CM | POA: Diagnosis not present

## 2014-12-11 DIAGNOSIS — N186 End stage renal disease: Secondary | ICD-10-CM | POA: Diagnosis not present

## 2014-12-11 DIAGNOSIS — Z23 Encounter for immunization: Secondary | ICD-10-CM | POA: Diagnosis not present

## 2014-12-11 DIAGNOSIS — E119 Type 2 diabetes mellitus without complications: Secondary | ICD-10-CM | POA: Diagnosis not present

## 2014-12-11 DIAGNOSIS — N2581 Secondary hyperparathyroidism of renal origin: Secondary | ICD-10-CM | POA: Diagnosis not present

## 2014-12-11 DIAGNOSIS — D631 Anemia in chronic kidney disease: Secondary | ICD-10-CM | POA: Diagnosis not present

## 2014-12-11 DIAGNOSIS — D509 Iron deficiency anemia, unspecified: Secondary | ICD-10-CM | POA: Diagnosis not present

## 2014-12-13 DIAGNOSIS — N2581 Secondary hyperparathyroidism of renal origin: Secondary | ICD-10-CM | POA: Diagnosis not present

## 2014-12-13 DIAGNOSIS — Z23 Encounter for immunization: Secondary | ICD-10-CM | POA: Diagnosis not present

## 2014-12-13 DIAGNOSIS — D509 Iron deficiency anemia, unspecified: Secondary | ICD-10-CM | POA: Diagnosis not present

## 2014-12-13 DIAGNOSIS — E119 Type 2 diabetes mellitus without complications: Secondary | ICD-10-CM | POA: Diagnosis not present

## 2014-12-13 DIAGNOSIS — D631 Anemia in chronic kidney disease: Secondary | ICD-10-CM | POA: Diagnosis not present

## 2014-12-13 DIAGNOSIS — N186 End stage renal disease: Secondary | ICD-10-CM | POA: Diagnosis not present

## 2014-12-17 DIAGNOSIS — D509 Iron deficiency anemia, unspecified: Secondary | ICD-10-CM | POA: Diagnosis not present

## 2014-12-17 DIAGNOSIS — N186 End stage renal disease: Secondary | ICD-10-CM | POA: Diagnosis not present

## 2014-12-17 DIAGNOSIS — N2581 Secondary hyperparathyroidism of renal origin: Secondary | ICD-10-CM | POA: Diagnosis not present

## 2014-12-17 DIAGNOSIS — Z23 Encounter for immunization: Secondary | ICD-10-CM | POA: Diagnosis not present

## 2014-12-17 DIAGNOSIS — E119 Type 2 diabetes mellitus without complications: Secondary | ICD-10-CM | POA: Diagnosis not present

## 2014-12-17 DIAGNOSIS — D631 Anemia in chronic kidney disease: Secondary | ICD-10-CM | POA: Diagnosis not present

## 2014-12-18 DIAGNOSIS — E119 Type 2 diabetes mellitus without complications: Secondary | ICD-10-CM | POA: Diagnosis not present

## 2014-12-18 DIAGNOSIS — Z23 Encounter for immunization: Secondary | ICD-10-CM | POA: Diagnosis not present

## 2014-12-18 DIAGNOSIS — D631 Anemia in chronic kidney disease: Secondary | ICD-10-CM | POA: Diagnosis not present

## 2014-12-18 DIAGNOSIS — N186 End stage renal disease: Secondary | ICD-10-CM | POA: Diagnosis not present

## 2014-12-18 DIAGNOSIS — D509 Iron deficiency anemia, unspecified: Secondary | ICD-10-CM | POA: Diagnosis not present

## 2014-12-18 DIAGNOSIS — N2581 Secondary hyperparathyroidism of renal origin: Secondary | ICD-10-CM | POA: Diagnosis not present

## 2014-12-20 DIAGNOSIS — D509 Iron deficiency anemia, unspecified: Secondary | ICD-10-CM | POA: Diagnosis not present

## 2014-12-20 DIAGNOSIS — E119 Type 2 diabetes mellitus without complications: Secondary | ICD-10-CM | POA: Diagnosis not present

## 2014-12-20 DIAGNOSIS — N186 End stage renal disease: Secondary | ICD-10-CM | POA: Diagnosis not present

## 2014-12-20 DIAGNOSIS — Z23 Encounter for immunization: Secondary | ICD-10-CM | POA: Diagnosis not present

## 2014-12-20 DIAGNOSIS — N2581 Secondary hyperparathyroidism of renal origin: Secondary | ICD-10-CM | POA: Diagnosis not present

## 2014-12-20 DIAGNOSIS — D631 Anemia in chronic kidney disease: Secondary | ICD-10-CM | POA: Diagnosis not present

## 2014-12-23 DIAGNOSIS — D631 Anemia in chronic kidney disease: Secondary | ICD-10-CM | POA: Diagnosis not present

## 2014-12-23 DIAGNOSIS — N2581 Secondary hyperparathyroidism of renal origin: Secondary | ICD-10-CM | POA: Diagnosis not present

## 2014-12-23 DIAGNOSIS — N186 End stage renal disease: Secondary | ICD-10-CM | POA: Diagnosis not present

## 2014-12-23 DIAGNOSIS — Z23 Encounter for immunization: Secondary | ICD-10-CM | POA: Diagnosis not present

## 2014-12-23 DIAGNOSIS — E119 Type 2 diabetes mellitus without complications: Secondary | ICD-10-CM | POA: Diagnosis not present

## 2014-12-23 DIAGNOSIS — D509 Iron deficiency anemia, unspecified: Secondary | ICD-10-CM | POA: Diagnosis not present

## 2014-12-25 DIAGNOSIS — N2581 Secondary hyperparathyroidism of renal origin: Secondary | ICD-10-CM | POA: Diagnosis not present

## 2014-12-25 DIAGNOSIS — D631 Anemia in chronic kidney disease: Secondary | ICD-10-CM | POA: Diagnosis not present

## 2014-12-25 DIAGNOSIS — D509 Iron deficiency anemia, unspecified: Secondary | ICD-10-CM | POA: Diagnosis not present

## 2014-12-25 DIAGNOSIS — E119 Type 2 diabetes mellitus without complications: Secondary | ICD-10-CM | POA: Diagnosis not present

## 2014-12-25 DIAGNOSIS — Z23 Encounter for immunization: Secondary | ICD-10-CM | POA: Diagnosis not present

## 2014-12-25 DIAGNOSIS — N186 End stage renal disease: Secondary | ICD-10-CM | POA: Diagnosis not present

## 2014-12-27 DIAGNOSIS — D631 Anemia in chronic kidney disease: Secondary | ICD-10-CM | POA: Diagnosis not present

## 2014-12-27 DIAGNOSIS — Z23 Encounter for immunization: Secondary | ICD-10-CM | POA: Diagnosis not present

## 2014-12-27 DIAGNOSIS — N186 End stage renal disease: Secondary | ICD-10-CM | POA: Diagnosis not present

## 2014-12-27 DIAGNOSIS — D509 Iron deficiency anemia, unspecified: Secondary | ICD-10-CM | POA: Diagnosis not present

## 2014-12-27 DIAGNOSIS — E119 Type 2 diabetes mellitus without complications: Secondary | ICD-10-CM | POA: Diagnosis not present

## 2014-12-27 DIAGNOSIS — N2581 Secondary hyperparathyroidism of renal origin: Secondary | ICD-10-CM | POA: Diagnosis not present

## 2014-12-30 DIAGNOSIS — Z23 Encounter for immunization: Secondary | ICD-10-CM | POA: Diagnosis not present

## 2014-12-30 DIAGNOSIS — N2581 Secondary hyperparathyroidism of renal origin: Secondary | ICD-10-CM | POA: Diagnosis not present

## 2014-12-30 DIAGNOSIS — E119 Type 2 diabetes mellitus without complications: Secondary | ICD-10-CM | POA: Diagnosis not present

## 2014-12-30 DIAGNOSIS — D631 Anemia in chronic kidney disease: Secondary | ICD-10-CM | POA: Diagnosis not present

## 2014-12-30 DIAGNOSIS — N186 End stage renal disease: Secondary | ICD-10-CM | POA: Diagnosis not present

## 2014-12-30 DIAGNOSIS — D509 Iron deficiency anemia, unspecified: Secondary | ICD-10-CM | POA: Diagnosis not present

## 2015-01-01 DIAGNOSIS — N2581 Secondary hyperparathyroidism of renal origin: Secondary | ICD-10-CM | POA: Diagnosis not present

## 2015-01-01 DIAGNOSIS — Z23 Encounter for immunization: Secondary | ICD-10-CM | POA: Diagnosis not present

## 2015-01-01 DIAGNOSIS — E1129 Type 2 diabetes mellitus with other diabetic kidney complication: Secondary | ICD-10-CM | POA: Diagnosis not present

## 2015-01-01 DIAGNOSIS — D631 Anemia in chronic kidney disease: Secondary | ICD-10-CM | POA: Diagnosis not present

## 2015-01-01 DIAGNOSIS — E119 Type 2 diabetes mellitus without complications: Secondary | ICD-10-CM | POA: Diagnosis not present

## 2015-01-01 DIAGNOSIS — D509 Iron deficiency anemia, unspecified: Secondary | ICD-10-CM | POA: Diagnosis not present

## 2015-01-01 DIAGNOSIS — N186 End stage renal disease: Secondary | ICD-10-CM | POA: Diagnosis not present

## 2015-01-03 DIAGNOSIS — D509 Iron deficiency anemia, unspecified: Secondary | ICD-10-CM | POA: Diagnosis not present

## 2015-01-03 DIAGNOSIS — D631 Anemia in chronic kidney disease: Secondary | ICD-10-CM | POA: Diagnosis not present

## 2015-01-03 DIAGNOSIS — Z23 Encounter for immunization: Secondary | ICD-10-CM | POA: Diagnosis not present

## 2015-01-03 DIAGNOSIS — N186 End stage renal disease: Secondary | ICD-10-CM | POA: Diagnosis not present

## 2015-01-03 DIAGNOSIS — E119 Type 2 diabetes mellitus without complications: Secondary | ICD-10-CM | POA: Diagnosis not present

## 2015-01-03 DIAGNOSIS — N2581 Secondary hyperparathyroidism of renal origin: Secondary | ICD-10-CM | POA: Diagnosis not present

## 2015-01-06 DIAGNOSIS — Z992 Dependence on renal dialysis: Secondary | ICD-10-CM | POA: Diagnosis not present

## 2015-01-06 DIAGNOSIS — E119 Type 2 diabetes mellitus without complications: Secondary | ICD-10-CM | POA: Diagnosis not present

## 2015-01-06 DIAGNOSIS — D631 Anemia in chronic kidney disease: Secondary | ICD-10-CM | POA: Diagnosis not present

## 2015-01-06 DIAGNOSIS — N2581 Secondary hyperparathyroidism of renal origin: Secondary | ICD-10-CM | POA: Diagnosis not present

## 2015-01-06 DIAGNOSIS — E1129 Type 2 diabetes mellitus with other diabetic kidney complication: Secondary | ICD-10-CM | POA: Diagnosis not present

## 2015-01-06 DIAGNOSIS — Z23 Encounter for immunization: Secondary | ICD-10-CM | POA: Diagnosis not present

## 2015-01-06 DIAGNOSIS — D509 Iron deficiency anemia, unspecified: Secondary | ICD-10-CM | POA: Diagnosis not present

## 2015-01-06 DIAGNOSIS — N186 End stage renal disease: Secondary | ICD-10-CM | POA: Diagnosis not present

## 2015-01-08 DIAGNOSIS — D509 Iron deficiency anemia, unspecified: Secondary | ICD-10-CM | POA: Diagnosis not present

## 2015-01-08 DIAGNOSIS — N2581 Secondary hyperparathyroidism of renal origin: Secondary | ICD-10-CM | POA: Diagnosis not present

## 2015-01-08 DIAGNOSIS — D631 Anemia in chronic kidney disease: Secondary | ICD-10-CM | POA: Diagnosis not present

## 2015-01-08 DIAGNOSIS — E119 Type 2 diabetes mellitus without complications: Secondary | ICD-10-CM | POA: Diagnosis not present

## 2015-01-08 DIAGNOSIS — N186 End stage renal disease: Secondary | ICD-10-CM | POA: Diagnosis not present

## 2015-01-10 DIAGNOSIS — E119 Type 2 diabetes mellitus without complications: Secondary | ICD-10-CM | POA: Diagnosis not present

## 2015-01-10 DIAGNOSIS — D509 Iron deficiency anemia, unspecified: Secondary | ICD-10-CM | POA: Diagnosis not present

## 2015-01-10 DIAGNOSIS — D631 Anemia in chronic kidney disease: Secondary | ICD-10-CM | POA: Diagnosis not present

## 2015-01-10 DIAGNOSIS — N2581 Secondary hyperparathyroidism of renal origin: Secondary | ICD-10-CM | POA: Diagnosis not present

## 2015-01-10 DIAGNOSIS — N186 End stage renal disease: Secondary | ICD-10-CM | POA: Diagnosis not present

## 2015-01-13 DIAGNOSIS — N2581 Secondary hyperparathyroidism of renal origin: Secondary | ICD-10-CM | POA: Diagnosis not present

## 2015-01-13 DIAGNOSIS — N186 End stage renal disease: Secondary | ICD-10-CM | POA: Diagnosis not present

## 2015-01-13 DIAGNOSIS — E119 Type 2 diabetes mellitus without complications: Secondary | ICD-10-CM | POA: Diagnosis not present

## 2015-01-13 DIAGNOSIS — D631 Anemia in chronic kidney disease: Secondary | ICD-10-CM | POA: Diagnosis not present

## 2015-01-13 DIAGNOSIS — D509 Iron deficiency anemia, unspecified: Secondary | ICD-10-CM | POA: Diagnosis not present

## 2015-01-15 DIAGNOSIS — N186 End stage renal disease: Secondary | ICD-10-CM | POA: Diagnosis not present

## 2015-01-15 DIAGNOSIS — D631 Anemia in chronic kidney disease: Secondary | ICD-10-CM | POA: Diagnosis not present

## 2015-01-15 DIAGNOSIS — N2581 Secondary hyperparathyroidism of renal origin: Secondary | ICD-10-CM | POA: Diagnosis not present

## 2015-01-15 DIAGNOSIS — D509 Iron deficiency anemia, unspecified: Secondary | ICD-10-CM | POA: Diagnosis not present

## 2015-01-15 DIAGNOSIS — E119 Type 2 diabetes mellitus without complications: Secondary | ICD-10-CM | POA: Diagnosis not present

## 2015-01-17 DIAGNOSIS — N2581 Secondary hyperparathyroidism of renal origin: Secondary | ICD-10-CM | POA: Diagnosis not present

## 2015-01-17 DIAGNOSIS — D631 Anemia in chronic kidney disease: Secondary | ICD-10-CM | POA: Diagnosis not present

## 2015-01-17 DIAGNOSIS — D509 Iron deficiency anemia, unspecified: Secondary | ICD-10-CM | POA: Diagnosis not present

## 2015-01-17 DIAGNOSIS — E119 Type 2 diabetes mellitus without complications: Secondary | ICD-10-CM | POA: Diagnosis not present

## 2015-01-17 DIAGNOSIS — N186 End stage renal disease: Secondary | ICD-10-CM | POA: Diagnosis not present

## 2015-01-20 DIAGNOSIS — D509 Iron deficiency anemia, unspecified: Secondary | ICD-10-CM | POA: Diagnosis not present

## 2015-01-20 DIAGNOSIS — N2581 Secondary hyperparathyroidism of renal origin: Secondary | ICD-10-CM | POA: Diagnosis not present

## 2015-01-20 DIAGNOSIS — E119 Type 2 diabetes mellitus without complications: Secondary | ICD-10-CM | POA: Diagnosis not present

## 2015-01-20 DIAGNOSIS — D631 Anemia in chronic kidney disease: Secondary | ICD-10-CM | POA: Diagnosis not present

## 2015-01-20 DIAGNOSIS — N186 End stage renal disease: Secondary | ICD-10-CM | POA: Diagnosis not present

## 2015-01-22 DIAGNOSIS — N186 End stage renal disease: Secondary | ICD-10-CM | POA: Diagnosis not present

## 2015-01-22 DIAGNOSIS — E119 Type 2 diabetes mellitus without complications: Secondary | ICD-10-CM | POA: Diagnosis not present

## 2015-01-22 DIAGNOSIS — D631 Anemia in chronic kidney disease: Secondary | ICD-10-CM | POA: Diagnosis not present

## 2015-01-22 DIAGNOSIS — D509 Iron deficiency anemia, unspecified: Secondary | ICD-10-CM | POA: Diagnosis not present

## 2015-01-22 DIAGNOSIS — N2581 Secondary hyperparathyroidism of renal origin: Secondary | ICD-10-CM | POA: Diagnosis not present

## 2015-01-24 DIAGNOSIS — D631 Anemia in chronic kidney disease: Secondary | ICD-10-CM | POA: Diagnosis not present

## 2015-01-24 DIAGNOSIS — N2581 Secondary hyperparathyroidism of renal origin: Secondary | ICD-10-CM | POA: Diagnosis not present

## 2015-01-24 DIAGNOSIS — D509 Iron deficiency anemia, unspecified: Secondary | ICD-10-CM | POA: Diagnosis not present

## 2015-01-24 DIAGNOSIS — E119 Type 2 diabetes mellitus without complications: Secondary | ICD-10-CM | POA: Diagnosis not present

## 2015-01-24 DIAGNOSIS — N186 End stage renal disease: Secondary | ICD-10-CM | POA: Diagnosis not present

## 2015-01-27 DIAGNOSIS — E119 Type 2 diabetes mellitus without complications: Secondary | ICD-10-CM | POA: Diagnosis not present

## 2015-01-27 DIAGNOSIS — N186 End stage renal disease: Secondary | ICD-10-CM | POA: Diagnosis not present

## 2015-01-27 DIAGNOSIS — N2581 Secondary hyperparathyroidism of renal origin: Secondary | ICD-10-CM | POA: Diagnosis not present

## 2015-01-27 DIAGNOSIS — D631 Anemia in chronic kidney disease: Secondary | ICD-10-CM | POA: Diagnosis not present

## 2015-01-27 DIAGNOSIS — D509 Iron deficiency anemia, unspecified: Secondary | ICD-10-CM | POA: Diagnosis not present

## 2015-01-29 DIAGNOSIS — D631 Anemia in chronic kidney disease: Secondary | ICD-10-CM | POA: Diagnosis not present

## 2015-01-29 DIAGNOSIS — N186 End stage renal disease: Secondary | ICD-10-CM | POA: Diagnosis not present

## 2015-01-29 DIAGNOSIS — E119 Type 2 diabetes mellitus without complications: Secondary | ICD-10-CM | POA: Diagnosis not present

## 2015-01-29 DIAGNOSIS — N2581 Secondary hyperparathyroidism of renal origin: Secondary | ICD-10-CM | POA: Diagnosis not present

## 2015-01-29 DIAGNOSIS — D509 Iron deficiency anemia, unspecified: Secondary | ICD-10-CM | POA: Diagnosis not present

## 2015-02-01 DIAGNOSIS — D509 Iron deficiency anemia, unspecified: Secondary | ICD-10-CM | POA: Diagnosis not present

## 2015-02-01 DIAGNOSIS — N2581 Secondary hyperparathyroidism of renal origin: Secondary | ICD-10-CM | POA: Diagnosis not present

## 2015-02-01 DIAGNOSIS — D631 Anemia in chronic kidney disease: Secondary | ICD-10-CM | POA: Diagnosis not present

## 2015-02-01 DIAGNOSIS — E119 Type 2 diabetes mellitus without complications: Secondary | ICD-10-CM | POA: Diagnosis not present

## 2015-02-01 DIAGNOSIS — N186 End stage renal disease: Secondary | ICD-10-CM | POA: Diagnosis not present

## 2015-02-03 DIAGNOSIS — N2581 Secondary hyperparathyroidism of renal origin: Secondary | ICD-10-CM | POA: Diagnosis not present

## 2015-02-03 DIAGNOSIS — D509 Iron deficiency anemia, unspecified: Secondary | ICD-10-CM | POA: Diagnosis not present

## 2015-02-03 DIAGNOSIS — N186 End stage renal disease: Secondary | ICD-10-CM | POA: Diagnosis not present

## 2015-02-03 DIAGNOSIS — D631 Anemia in chronic kidney disease: Secondary | ICD-10-CM | POA: Diagnosis not present

## 2015-02-03 DIAGNOSIS — E119 Type 2 diabetes mellitus without complications: Secondary | ICD-10-CM | POA: Diagnosis not present

## 2015-02-05 DIAGNOSIS — Z992 Dependence on renal dialysis: Secondary | ICD-10-CM | POA: Diagnosis not present

## 2015-02-05 DIAGNOSIS — D509 Iron deficiency anemia, unspecified: Secondary | ICD-10-CM | POA: Diagnosis not present

## 2015-02-05 DIAGNOSIS — N2581 Secondary hyperparathyroidism of renal origin: Secondary | ICD-10-CM | POA: Diagnosis not present

## 2015-02-05 DIAGNOSIS — E119 Type 2 diabetes mellitus without complications: Secondary | ICD-10-CM | POA: Diagnosis not present

## 2015-02-05 DIAGNOSIS — N186 End stage renal disease: Secondary | ICD-10-CM | POA: Diagnosis not present

## 2015-02-05 DIAGNOSIS — E1129 Type 2 diabetes mellitus with other diabetic kidney complication: Secondary | ICD-10-CM | POA: Diagnosis not present

## 2015-02-05 DIAGNOSIS — D631 Anemia in chronic kidney disease: Secondary | ICD-10-CM | POA: Diagnosis not present

## 2015-02-07 DIAGNOSIS — E119 Type 2 diabetes mellitus without complications: Secondary | ICD-10-CM | POA: Diagnosis not present

## 2015-02-07 DIAGNOSIS — N186 End stage renal disease: Secondary | ICD-10-CM | POA: Diagnosis not present

## 2015-02-07 DIAGNOSIS — D631 Anemia in chronic kidney disease: Secondary | ICD-10-CM | POA: Diagnosis not present

## 2015-02-07 DIAGNOSIS — D509 Iron deficiency anemia, unspecified: Secondary | ICD-10-CM | POA: Diagnosis not present

## 2015-02-07 DIAGNOSIS — N2581 Secondary hyperparathyroidism of renal origin: Secondary | ICD-10-CM | POA: Diagnosis not present

## 2015-02-10 DIAGNOSIS — D631 Anemia in chronic kidney disease: Secondary | ICD-10-CM | POA: Diagnosis not present

## 2015-02-10 DIAGNOSIS — N186 End stage renal disease: Secondary | ICD-10-CM | POA: Diagnosis not present

## 2015-02-10 DIAGNOSIS — E119 Type 2 diabetes mellitus without complications: Secondary | ICD-10-CM | POA: Diagnosis not present

## 2015-02-10 DIAGNOSIS — D509 Iron deficiency anemia, unspecified: Secondary | ICD-10-CM | POA: Diagnosis not present

## 2015-02-10 DIAGNOSIS — N2581 Secondary hyperparathyroidism of renal origin: Secondary | ICD-10-CM | POA: Diagnosis not present

## 2015-02-12 DIAGNOSIS — E119 Type 2 diabetes mellitus without complications: Secondary | ICD-10-CM | POA: Diagnosis not present

## 2015-02-12 DIAGNOSIS — N186 End stage renal disease: Secondary | ICD-10-CM | POA: Diagnosis not present

## 2015-02-12 DIAGNOSIS — N2581 Secondary hyperparathyroidism of renal origin: Secondary | ICD-10-CM | POA: Diagnosis not present

## 2015-02-12 DIAGNOSIS — D509 Iron deficiency anemia, unspecified: Secondary | ICD-10-CM | POA: Diagnosis not present

## 2015-02-12 DIAGNOSIS — D631 Anemia in chronic kidney disease: Secondary | ICD-10-CM | POA: Diagnosis not present

## 2015-02-14 DIAGNOSIS — N186 End stage renal disease: Secondary | ICD-10-CM | POA: Diagnosis not present

## 2015-02-14 DIAGNOSIS — N2581 Secondary hyperparathyroidism of renal origin: Secondary | ICD-10-CM | POA: Diagnosis not present

## 2015-02-14 DIAGNOSIS — D631 Anemia in chronic kidney disease: Secondary | ICD-10-CM | POA: Diagnosis not present

## 2015-02-14 DIAGNOSIS — D509 Iron deficiency anemia, unspecified: Secondary | ICD-10-CM | POA: Diagnosis not present

## 2015-02-14 DIAGNOSIS — E119 Type 2 diabetes mellitus without complications: Secondary | ICD-10-CM | POA: Diagnosis not present

## 2015-02-17 DIAGNOSIS — N2581 Secondary hyperparathyroidism of renal origin: Secondary | ICD-10-CM | POA: Diagnosis not present

## 2015-02-17 DIAGNOSIS — E119 Type 2 diabetes mellitus without complications: Secondary | ICD-10-CM | POA: Diagnosis not present

## 2015-02-17 DIAGNOSIS — N186 End stage renal disease: Secondary | ICD-10-CM | POA: Diagnosis not present

## 2015-02-17 DIAGNOSIS — D631 Anemia in chronic kidney disease: Secondary | ICD-10-CM | POA: Diagnosis not present

## 2015-02-17 DIAGNOSIS — D509 Iron deficiency anemia, unspecified: Secondary | ICD-10-CM | POA: Diagnosis not present

## 2015-02-18 ENCOUNTER — Ambulatory Visit (HOSPITAL_BASED_OUTPATIENT_CLINIC_OR_DEPARTMENT_OTHER): Payer: Medicare Other | Admitting: Oncology

## 2015-02-18 ENCOUNTER — Telehealth: Payer: Self-pay | Admitting: Oncology

## 2015-02-18 VITALS — BP 147/89 | HR 67 | Temp 98.2°F | Resp 18 | Ht 62.0 in | Wt 110.0 lb

## 2015-02-18 DIAGNOSIS — D631 Anemia in chronic kidney disease: Secondary | ICD-10-CM | POA: Diagnosis not present

## 2015-02-18 DIAGNOSIS — N189 Chronic kidney disease, unspecified: Secondary | ICD-10-CM | POA: Diagnosis not present

## 2015-02-18 DIAGNOSIS — Z992 Dependence on renal dialysis: Secondary | ICD-10-CM | POA: Diagnosis not present

## 2015-02-18 DIAGNOSIS — C50511 Malignant neoplasm of lower-outer quadrant of right female breast: Secondary | ICD-10-CM

## 2015-02-18 NOTE — Progress Notes (Signed)
Hematology and Oncology Follow Up Visit  [name] [id number] [birth year] 81 y.o. 02/18/2015 10:59 AM [name], MDGoldsborough, Heather Keto, MD   Principle Diagnosis: 81 year old woman with the diagnosis of invasive ductal carcinoma of the right breast diagnosed in April 2015. She presented with a 3.1 cm mass in the right lower breast. Tumor was found to be ER positive, PR negative HER-2 negative. She also has a remote history of left sided breast cancer.  Prior Therapy: She underwent a right total mastectomy under the care of Dr. Dalbert Batman on 08/07/2013. The pathology confirmed the presence of 3.5 cm invasive ductal carcinoma grade I/III with negative surgical margins. She was ER positive PR negative. With Ki-67 of 9%. The pathological staging was pT2 NX.  She is also status post left mastectomy done in 1988.  Current therapy: Arimidex 1 mg daily started in July 2015.  Interim History:  [name] presents today for a follow-up visit with her daughter. Since the last visit, she reports no major changes in her health. She did not any hospitalization or illnesses. She continues to live independently but her mobility is very limited. She continues to be hemodialysis-dependent and tolerates it well Monday, Wednesday and Friday. She is wheelchair-bound but does report using a walker for short distances. Does not report any falls or syncope.  She is reporting no complications from Arimidex including arthralgias, myalgias or GI toxicities. Has not reported any masses or lesions on her chest wall.  She does not report any fevers or chills or sweats. Has not reported any weight loss or appetite changes. She does not report any chest pain shortness of breath cough or hemoptysis. Does not report any palpitation orthopnea or PND. Does not report any nausea or vomiting or abdominal pain. Does not report any frequency urgency or hesitancy. Does not report any worsening skeletal complaints. Does not  report any lymphadenopathy or petechiae. Has not reported any other neurological symptoms of headaches or blurry vision. Rest of the review of system is unremarkable.  Medications: I have reviewed the patient's current medications.  Current Outpatient Prescriptions  Medication Sig Dispense Refill  . amLODipine (NORVASC) 5 MG tablet Take 5 mg by mouth daily.  1  . anastrozole (ARIMIDEX) 1 MG tablet Take 1 tablet (1 mg total) by mouth daily. 30 tablet 6  . benzonatate (TESSALON) 100 MG capsule Take 100 mg by mouth 3 (three) times daily as needed for cough.    Marland Kitchen ibuprofen (ADVIL,MOTRIN) 200 MG tablet Take 200 mg by mouth every 6 (six) hours as needed for fever, headache or mild pain.    Marland Kitchen lanthanum (FOSRENOL) 1000 MG chewable tablet Chew 1,000 mg by mouth 2 (two) times daily with a meal.     . metoprolol succinate (TOPROL-XL) 50 MG 24 hr tablet take 1 tablet by mouth at bedtime for high blood pressure  1  . povidone-iodine (BETADINE) 10 % ointment Apply 1 application topically as needed for wound care.    . SENSIPAR 60 MG tablet Take 1 tablet by mouth daily.     No current facility-administered medications for this visit.     Allergies:  Allergies  Allergen Reactions  . Aspirin     NO BLOOD THINNERS OF ANY KIND    Past Medical History, Surgical history, Social history, and Family History were reviewed and updated.  Physical Exam: Blood pressure 147/89, pulse 67, temperature 98.2 F (36.8 C), temperature source Oral, resp. rate 18, height _0  (1.575 m), weight 110 lb (49.896  kg), SpO2 100 %. ECOG: 2 General appearance: alert and cooperative. Elderly woman chronically ill appearing but without distress. Head: Normocephalic, without obvious abnormality no ulcers or lesions. Neck: no adenopathy Lymph nodes: Cervical, supraclavicular, and axillary nodes normal. Heart:regular rate and rhythm, S1, S2 normal, no murmur, click, rub or gallop Lung:chest clear, no wheezing, rales, normal  symmetric air entry Abdomin: soft, non-tender, without masses or organomegaly no shifting dullness or ascites. EXT:no erythema, induration, or nodules   Lab Results: Lab Results  Component Value Date   WBC 5.6 07/10/2014   HGB 11.9* 07/10/2014   HCT 35.8* 07/10/2014   MCV 94.7 07/10/2014   PLT 182 07/10/2014     Chemistry      Component Value Date/Time   NA 138 07/10/2014 1011   NA 139 09/04/2013 1025   K 4.1 07/10/2014 1011   K 4.0 09/04/2013 1025   CL 92* 07/10/2014 1011   CO2 32 07/10/2014 1011   CO2 31* 09/04/2013 1025   BUN 51* 07/10/2014 1011   BUN 49.9* 09/04/2013 1025   CREATININE 6.43* 07/10/2014 1011   CREATININE 6.2* 09/04/2013 1025      Component Value Date/Time   CALCIUM 10.4* 07/10/2014 1011   CALCIUM 10.4 09/04/2013 1025   ALKPHOS 152* 09/04/2013 1025   ALKPHOS 100 07/26/2013 0938   AST 43* 09/04/2013 1025   AST 40* 07/26/2013 0938   ALT 21 09/04/2013 1025   ALT 26 07/26/2013 0938   BILITOT 0.54 09/04/2013 1025   BILITOT 0.5 07/26/2013 0938       Impression and Plan:   81 year old woman with the following issues:  1. An invasive ductal carcinoma of the right breast after presenting with 3.5 cm mass. She is status post right mastectomy with the pathological staging of T2 Nx completed in June 2015. Her clinical staging is T2 N0 with clinically negative axilla. Her tumor is ER positive HER-2 negative. With low grade features. She is status post left mastectomy for a breast cancer in 1988 and that time she was treated with tamoxifen.  She is currently on Arimidex and tolerated it well since July 2015. The plan to treat her with at least 5 years for the time being. No need to change her management at this time.  2. Renal insufficiency: Is currently hemodialysis-dependent.  3. Anemia: Related to her kidney disease.  4. Follow-up: Will be in 8 months.   [email or web address], MD 12/13/201610:59 AM

## 2015-02-18 NOTE — Telephone Encounter (Signed)
per pof to sch pt appt-gave pt copy of avs °

## 2015-02-19 DIAGNOSIS — E119 Type 2 diabetes mellitus without complications: Secondary | ICD-10-CM | POA: Diagnosis not present

## 2015-02-19 DIAGNOSIS — N2581 Secondary hyperparathyroidism of renal origin: Secondary | ICD-10-CM | POA: Diagnosis not present

## 2015-02-19 DIAGNOSIS — D631 Anemia in chronic kidney disease: Secondary | ICD-10-CM | POA: Diagnosis not present

## 2015-02-19 DIAGNOSIS — N186 End stage renal disease: Secondary | ICD-10-CM | POA: Diagnosis not present

## 2015-02-19 DIAGNOSIS — D509 Iron deficiency anemia, unspecified: Secondary | ICD-10-CM | POA: Diagnosis not present

## 2015-02-21 DIAGNOSIS — D509 Iron deficiency anemia, unspecified: Secondary | ICD-10-CM | POA: Diagnosis not present

## 2015-02-21 DIAGNOSIS — N186 End stage renal disease: Secondary | ICD-10-CM | POA: Diagnosis not present

## 2015-02-21 DIAGNOSIS — E119 Type 2 diabetes mellitus without complications: Secondary | ICD-10-CM | POA: Diagnosis not present

## 2015-02-21 DIAGNOSIS — D631 Anemia in chronic kidney disease: Secondary | ICD-10-CM | POA: Diagnosis not present

## 2015-02-21 DIAGNOSIS — N2581 Secondary hyperparathyroidism of renal origin: Secondary | ICD-10-CM | POA: Diagnosis not present

## 2015-02-24 DIAGNOSIS — E119 Type 2 diabetes mellitus without complications: Secondary | ICD-10-CM | POA: Diagnosis not present

## 2015-02-24 DIAGNOSIS — N186 End stage renal disease: Secondary | ICD-10-CM | POA: Diagnosis not present

## 2015-02-24 DIAGNOSIS — D631 Anemia in chronic kidney disease: Secondary | ICD-10-CM | POA: Diagnosis not present

## 2015-02-24 DIAGNOSIS — N2581 Secondary hyperparathyroidism of renal origin: Secondary | ICD-10-CM | POA: Diagnosis not present

## 2015-02-24 DIAGNOSIS — D509 Iron deficiency anemia, unspecified: Secondary | ICD-10-CM | POA: Diagnosis not present

## 2015-02-26 DIAGNOSIS — N186 End stage renal disease: Secondary | ICD-10-CM | POA: Diagnosis not present

## 2015-02-26 DIAGNOSIS — D509 Iron deficiency anemia, unspecified: Secondary | ICD-10-CM | POA: Diagnosis not present

## 2015-02-26 DIAGNOSIS — E119 Type 2 diabetes mellitus without complications: Secondary | ICD-10-CM | POA: Diagnosis not present

## 2015-02-26 DIAGNOSIS — D631 Anemia in chronic kidney disease: Secondary | ICD-10-CM | POA: Diagnosis not present

## 2015-02-26 DIAGNOSIS — N2581 Secondary hyperparathyroidism of renal origin: Secondary | ICD-10-CM | POA: Diagnosis not present

## 2015-02-28 DIAGNOSIS — N186 End stage renal disease: Secondary | ICD-10-CM | POA: Diagnosis not present

## 2015-02-28 DIAGNOSIS — D509 Iron deficiency anemia, unspecified: Secondary | ICD-10-CM | POA: Diagnosis not present

## 2015-02-28 DIAGNOSIS — D631 Anemia in chronic kidney disease: Secondary | ICD-10-CM | POA: Diagnosis not present

## 2015-02-28 DIAGNOSIS — E119 Type 2 diabetes mellitus without complications: Secondary | ICD-10-CM | POA: Diagnosis not present

## 2015-02-28 DIAGNOSIS — N2581 Secondary hyperparathyroidism of renal origin: Secondary | ICD-10-CM | POA: Diagnosis not present

## 2015-03-03 DIAGNOSIS — E119 Type 2 diabetes mellitus without complications: Secondary | ICD-10-CM | POA: Diagnosis not present

## 2015-03-03 DIAGNOSIS — D509 Iron deficiency anemia, unspecified: Secondary | ICD-10-CM | POA: Diagnosis not present

## 2015-03-03 DIAGNOSIS — N186 End stage renal disease: Secondary | ICD-10-CM | POA: Diagnosis not present

## 2015-03-03 DIAGNOSIS — D631 Anemia in chronic kidney disease: Secondary | ICD-10-CM | POA: Diagnosis not present

## 2015-03-03 DIAGNOSIS — N2581 Secondary hyperparathyroidism of renal origin: Secondary | ICD-10-CM | POA: Diagnosis not present

## 2015-03-05 DIAGNOSIS — E119 Type 2 diabetes mellitus without complications: Secondary | ICD-10-CM | POA: Diagnosis not present

## 2015-03-05 DIAGNOSIS — D631 Anemia in chronic kidney disease: Secondary | ICD-10-CM | POA: Diagnosis not present

## 2015-03-05 DIAGNOSIS — N2581 Secondary hyperparathyroidism of renal origin: Secondary | ICD-10-CM | POA: Diagnosis not present

## 2015-03-05 DIAGNOSIS — N186 End stage renal disease: Secondary | ICD-10-CM | POA: Diagnosis not present

## 2015-03-05 DIAGNOSIS — D509 Iron deficiency anemia, unspecified: Secondary | ICD-10-CM | POA: Diagnosis not present

## 2015-03-07 DIAGNOSIS — E119 Type 2 diabetes mellitus without complications: Secondary | ICD-10-CM | POA: Diagnosis not present

## 2015-03-07 DIAGNOSIS — N2581 Secondary hyperparathyroidism of renal origin: Secondary | ICD-10-CM | POA: Diagnosis not present

## 2015-03-07 DIAGNOSIS — D631 Anemia in chronic kidney disease: Secondary | ICD-10-CM | POA: Diagnosis not present

## 2015-03-07 DIAGNOSIS — D509 Iron deficiency anemia, unspecified: Secondary | ICD-10-CM | POA: Diagnosis not present

## 2015-03-07 DIAGNOSIS — N186 End stage renal disease: Secondary | ICD-10-CM | POA: Diagnosis not present

## 2015-03-08 DIAGNOSIS — Z992 Dependence on renal dialysis: Secondary | ICD-10-CM | POA: Diagnosis not present

## 2015-03-08 DIAGNOSIS — E1129 Type 2 diabetes mellitus with other diabetic kidney complication: Secondary | ICD-10-CM | POA: Diagnosis not present

## 2015-03-08 DIAGNOSIS — N186 End stage renal disease: Secondary | ICD-10-CM | POA: Diagnosis not present

## 2015-03-10 DIAGNOSIS — D509 Iron deficiency anemia, unspecified: Secondary | ICD-10-CM | POA: Diagnosis not present

## 2015-03-10 DIAGNOSIS — E119 Type 2 diabetes mellitus without complications: Secondary | ICD-10-CM | POA: Diagnosis not present

## 2015-03-10 DIAGNOSIS — N186 End stage renal disease: Secondary | ICD-10-CM | POA: Diagnosis not present

## 2015-03-10 DIAGNOSIS — D631 Anemia in chronic kidney disease: Secondary | ICD-10-CM | POA: Diagnosis not present

## 2015-03-10 DIAGNOSIS — N2581 Secondary hyperparathyroidism of renal origin: Secondary | ICD-10-CM | POA: Diagnosis not present

## 2015-03-12 DIAGNOSIS — N186 End stage renal disease: Secondary | ICD-10-CM | POA: Diagnosis not present

## 2015-03-12 DIAGNOSIS — E119 Type 2 diabetes mellitus without complications: Secondary | ICD-10-CM | POA: Diagnosis not present

## 2015-03-12 DIAGNOSIS — D509 Iron deficiency anemia, unspecified: Secondary | ICD-10-CM | POA: Diagnosis not present

## 2015-03-12 DIAGNOSIS — D631 Anemia in chronic kidney disease: Secondary | ICD-10-CM | POA: Diagnosis not present

## 2015-03-12 DIAGNOSIS — N2581 Secondary hyperparathyroidism of renal origin: Secondary | ICD-10-CM | POA: Diagnosis not present

## 2015-03-14 DIAGNOSIS — D509 Iron deficiency anemia, unspecified: Secondary | ICD-10-CM | POA: Diagnosis not present

## 2015-03-14 DIAGNOSIS — N186 End stage renal disease: Secondary | ICD-10-CM | POA: Diagnosis not present

## 2015-03-14 DIAGNOSIS — N2581 Secondary hyperparathyroidism of renal origin: Secondary | ICD-10-CM | POA: Diagnosis not present

## 2015-03-14 DIAGNOSIS — E119 Type 2 diabetes mellitus without complications: Secondary | ICD-10-CM | POA: Diagnosis not present

## 2015-03-14 DIAGNOSIS — D631 Anemia in chronic kidney disease: Secondary | ICD-10-CM | POA: Diagnosis not present

## 2015-03-17 DIAGNOSIS — D509 Iron deficiency anemia, unspecified: Secondary | ICD-10-CM | POA: Diagnosis not present

## 2015-03-17 DIAGNOSIS — N186 End stage renal disease: Secondary | ICD-10-CM | POA: Diagnosis not present

## 2015-03-17 DIAGNOSIS — E119 Type 2 diabetes mellitus without complications: Secondary | ICD-10-CM | POA: Diagnosis not present

## 2015-03-17 DIAGNOSIS — N2581 Secondary hyperparathyroidism of renal origin: Secondary | ICD-10-CM | POA: Diagnosis not present

## 2015-03-17 DIAGNOSIS — D631 Anemia in chronic kidney disease: Secondary | ICD-10-CM | POA: Diagnosis not present

## 2015-03-19 DIAGNOSIS — E119 Type 2 diabetes mellitus without complications: Secondary | ICD-10-CM | POA: Diagnosis not present

## 2015-03-19 DIAGNOSIS — D509 Iron deficiency anemia, unspecified: Secondary | ICD-10-CM | POA: Diagnosis not present

## 2015-03-19 DIAGNOSIS — N2581 Secondary hyperparathyroidism of renal origin: Secondary | ICD-10-CM | POA: Diagnosis not present

## 2015-03-19 DIAGNOSIS — N186 End stage renal disease: Secondary | ICD-10-CM | POA: Diagnosis not present

## 2015-03-19 DIAGNOSIS — D631 Anemia in chronic kidney disease: Secondary | ICD-10-CM | POA: Diagnosis not present

## 2015-03-21 DIAGNOSIS — D509 Iron deficiency anemia, unspecified: Secondary | ICD-10-CM | POA: Diagnosis not present

## 2015-03-21 DIAGNOSIS — N2581 Secondary hyperparathyroidism of renal origin: Secondary | ICD-10-CM | POA: Diagnosis not present

## 2015-03-21 DIAGNOSIS — E119 Type 2 diabetes mellitus without complications: Secondary | ICD-10-CM | POA: Diagnosis not present

## 2015-03-21 DIAGNOSIS — D631 Anemia in chronic kidney disease: Secondary | ICD-10-CM | POA: Diagnosis not present

## 2015-03-21 DIAGNOSIS — N186 End stage renal disease: Secondary | ICD-10-CM | POA: Diagnosis not present

## 2015-03-24 DIAGNOSIS — N2581 Secondary hyperparathyroidism of renal origin: Secondary | ICD-10-CM | POA: Diagnosis not present

## 2015-03-24 DIAGNOSIS — N186 End stage renal disease: Secondary | ICD-10-CM | POA: Diagnosis not present

## 2015-03-24 DIAGNOSIS — E119 Type 2 diabetes mellitus without complications: Secondary | ICD-10-CM | POA: Diagnosis not present

## 2015-03-24 DIAGNOSIS — D631 Anemia in chronic kidney disease: Secondary | ICD-10-CM | POA: Diagnosis not present

## 2015-03-24 DIAGNOSIS — D509 Iron deficiency anemia, unspecified: Secondary | ICD-10-CM | POA: Diagnosis not present

## 2015-03-26 DIAGNOSIS — N2581 Secondary hyperparathyroidism of renal origin: Secondary | ICD-10-CM | POA: Diagnosis not present

## 2015-03-26 DIAGNOSIS — E119 Type 2 diabetes mellitus without complications: Secondary | ICD-10-CM | POA: Diagnosis not present

## 2015-03-26 DIAGNOSIS — D509 Iron deficiency anemia, unspecified: Secondary | ICD-10-CM | POA: Diagnosis not present

## 2015-03-26 DIAGNOSIS — D631 Anemia in chronic kidney disease: Secondary | ICD-10-CM | POA: Diagnosis not present

## 2015-03-26 DIAGNOSIS — N186 End stage renal disease: Secondary | ICD-10-CM | POA: Diagnosis not present

## 2015-03-28 DIAGNOSIS — N186 End stage renal disease: Secondary | ICD-10-CM | POA: Diagnosis not present

## 2015-03-28 DIAGNOSIS — N2581 Secondary hyperparathyroidism of renal origin: Secondary | ICD-10-CM | POA: Diagnosis not present

## 2015-03-28 DIAGNOSIS — D509 Iron deficiency anemia, unspecified: Secondary | ICD-10-CM | POA: Diagnosis not present

## 2015-03-28 DIAGNOSIS — E119 Type 2 diabetes mellitus without complications: Secondary | ICD-10-CM | POA: Diagnosis not present

## 2015-03-28 DIAGNOSIS — D631 Anemia in chronic kidney disease: Secondary | ICD-10-CM | POA: Diagnosis not present

## 2015-03-31 DIAGNOSIS — D509 Iron deficiency anemia, unspecified: Secondary | ICD-10-CM | POA: Diagnosis not present

## 2015-03-31 DIAGNOSIS — E119 Type 2 diabetes mellitus without complications: Secondary | ICD-10-CM | POA: Diagnosis not present

## 2015-03-31 DIAGNOSIS — D631 Anemia in chronic kidney disease: Secondary | ICD-10-CM | POA: Diagnosis not present

## 2015-03-31 DIAGNOSIS — N2581 Secondary hyperparathyroidism of renal origin: Secondary | ICD-10-CM | POA: Diagnosis not present

## 2015-03-31 DIAGNOSIS — N186 End stage renal disease: Secondary | ICD-10-CM | POA: Diagnosis not present

## 2015-04-01 DIAGNOSIS — L97529 Non-pressure chronic ulcer of other part of left foot with unspecified severity: Secondary | ICD-10-CM | POA: Diagnosis not present

## 2015-04-02 DIAGNOSIS — D631 Anemia in chronic kidney disease: Secondary | ICD-10-CM | POA: Diagnosis not present

## 2015-04-02 DIAGNOSIS — E1129 Type 2 diabetes mellitus with other diabetic kidney complication: Secondary | ICD-10-CM | POA: Diagnosis not present

## 2015-04-02 DIAGNOSIS — N2581 Secondary hyperparathyroidism of renal origin: Secondary | ICD-10-CM | POA: Diagnosis not present

## 2015-04-02 DIAGNOSIS — N186 End stage renal disease: Secondary | ICD-10-CM | POA: Diagnosis not present

## 2015-04-02 DIAGNOSIS — D509 Iron deficiency anemia, unspecified: Secondary | ICD-10-CM | POA: Diagnosis not present

## 2015-04-02 DIAGNOSIS — E119 Type 2 diabetes mellitus without complications: Secondary | ICD-10-CM | POA: Diagnosis not present

## 2015-04-04 DIAGNOSIS — D631 Anemia in chronic kidney disease: Secondary | ICD-10-CM | POA: Diagnosis not present

## 2015-04-04 DIAGNOSIS — D509 Iron deficiency anemia, unspecified: Secondary | ICD-10-CM | POA: Diagnosis not present

## 2015-04-04 DIAGNOSIS — N2581 Secondary hyperparathyroidism of renal origin: Secondary | ICD-10-CM | POA: Diagnosis not present

## 2015-04-04 DIAGNOSIS — N186 End stage renal disease: Secondary | ICD-10-CM | POA: Diagnosis not present

## 2015-04-04 DIAGNOSIS — E119 Type 2 diabetes mellitus without complications: Secondary | ICD-10-CM | POA: Diagnosis not present

## 2015-04-07 DIAGNOSIS — D509 Iron deficiency anemia, unspecified: Secondary | ICD-10-CM | POA: Diagnosis not present

## 2015-04-07 DIAGNOSIS — N2581 Secondary hyperparathyroidism of renal origin: Secondary | ICD-10-CM | POA: Diagnosis not present

## 2015-04-07 DIAGNOSIS — D631 Anemia in chronic kidney disease: Secondary | ICD-10-CM | POA: Diagnosis not present

## 2015-04-07 DIAGNOSIS — E119 Type 2 diabetes mellitus without complications: Secondary | ICD-10-CM | POA: Diagnosis not present

## 2015-04-07 DIAGNOSIS — N186 End stage renal disease: Secondary | ICD-10-CM | POA: Diagnosis not present

## 2015-04-08 DIAGNOSIS — E1129 Type 2 diabetes mellitus with other diabetic kidney complication: Secondary | ICD-10-CM | POA: Diagnosis not present

## 2015-04-08 DIAGNOSIS — N186 End stage renal disease: Secondary | ICD-10-CM | POA: Diagnosis not present

## 2015-04-08 DIAGNOSIS — Z992 Dependence on renal dialysis: Secondary | ICD-10-CM | POA: Diagnosis not present

## 2015-04-09 DIAGNOSIS — N2581 Secondary hyperparathyroidism of renal origin: Secondary | ICD-10-CM | POA: Diagnosis not present

## 2015-04-09 DIAGNOSIS — E119 Type 2 diabetes mellitus without complications: Secondary | ICD-10-CM | POA: Diagnosis not present

## 2015-04-09 DIAGNOSIS — N186 End stage renal disease: Secondary | ICD-10-CM | POA: Diagnosis not present

## 2015-04-09 DIAGNOSIS — D509 Iron deficiency anemia, unspecified: Secondary | ICD-10-CM | POA: Diagnosis not present

## 2015-04-09 DIAGNOSIS — D631 Anemia in chronic kidney disease: Secondary | ICD-10-CM | POA: Diagnosis not present

## 2015-04-11 DIAGNOSIS — N186 End stage renal disease: Secondary | ICD-10-CM | POA: Diagnosis not present

## 2015-04-11 DIAGNOSIS — N2581 Secondary hyperparathyroidism of renal origin: Secondary | ICD-10-CM | POA: Diagnosis not present

## 2015-04-11 DIAGNOSIS — D509 Iron deficiency anemia, unspecified: Secondary | ICD-10-CM | POA: Diagnosis not present

## 2015-04-11 DIAGNOSIS — D631 Anemia in chronic kidney disease: Secondary | ICD-10-CM | POA: Diagnosis not present

## 2015-04-11 DIAGNOSIS — E119 Type 2 diabetes mellitus without complications: Secondary | ICD-10-CM | POA: Diagnosis not present

## 2015-04-14 DIAGNOSIS — N2581 Secondary hyperparathyroidism of renal origin: Secondary | ICD-10-CM | POA: Diagnosis not present

## 2015-04-14 DIAGNOSIS — D631 Anemia in chronic kidney disease: Secondary | ICD-10-CM | POA: Diagnosis not present

## 2015-04-14 DIAGNOSIS — N186 End stage renal disease: Secondary | ICD-10-CM | POA: Diagnosis not present

## 2015-04-14 DIAGNOSIS — D509 Iron deficiency anemia, unspecified: Secondary | ICD-10-CM | POA: Diagnosis not present

## 2015-04-14 DIAGNOSIS — E119 Type 2 diabetes mellitus without complications: Secondary | ICD-10-CM | POA: Diagnosis not present

## 2015-04-16 DIAGNOSIS — D509 Iron deficiency anemia, unspecified: Secondary | ICD-10-CM | POA: Diagnosis not present

## 2015-04-16 DIAGNOSIS — D631 Anemia in chronic kidney disease: Secondary | ICD-10-CM | POA: Diagnosis not present

## 2015-04-16 DIAGNOSIS — N2581 Secondary hyperparathyroidism of renal origin: Secondary | ICD-10-CM | POA: Diagnosis not present

## 2015-04-16 DIAGNOSIS — N186 End stage renal disease: Secondary | ICD-10-CM | POA: Diagnosis not present

## 2015-04-16 DIAGNOSIS — E119 Type 2 diabetes mellitus without complications: Secondary | ICD-10-CM | POA: Diagnosis not present

## 2015-04-17 DIAGNOSIS — N186 End stage renal disease: Secondary | ICD-10-CM | POA: Diagnosis not present

## 2015-04-17 DIAGNOSIS — I871 Compression of vein: Secondary | ICD-10-CM | POA: Diagnosis not present

## 2015-04-17 DIAGNOSIS — T82858D Stenosis of vascular prosthetic devices, implants and grafts, subsequent encounter: Secondary | ICD-10-CM | POA: Diagnosis not present

## 2015-04-17 DIAGNOSIS — Z992 Dependence on renal dialysis: Secondary | ICD-10-CM | POA: Diagnosis not present

## 2015-04-18 DIAGNOSIS — N2581 Secondary hyperparathyroidism of renal origin: Secondary | ICD-10-CM | POA: Diagnosis not present

## 2015-04-18 DIAGNOSIS — D631 Anemia in chronic kidney disease: Secondary | ICD-10-CM | POA: Diagnosis not present

## 2015-04-18 DIAGNOSIS — N186 End stage renal disease: Secondary | ICD-10-CM | POA: Diagnosis not present

## 2015-04-18 DIAGNOSIS — D509 Iron deficiency anemia, unspecified: Secondary | ICD-10-CM | POA: Diagnosis not present

## 2015-04-18 DIAGNOSIS — E119 Type 2 diabetes mellitus without complications: Secondary | ICD-10-CM | POA: Diagnosis not present

## 2015-04-21 DIAGNOSIS — D631 Anemia in chronic kidney disease: Secondary | ICD-10-CM | POA: Diagnosis not present

## 2015-04-21 DIAGNOSIS — E119 Type 2 diabetes mellitus without complications: Secondary | ICD-10-CM | POA: Diagnosis not present

## 2015-04-21 DIAGNOSIS — N186 End stage renal disease: Secondary | ICD-10-CM | POA: Diagnosis not present

## 2015-04-21 DIAGNOSIS — D509 Iron deficiency anemia, unspecified: Secondary | ICD-10-CM | POA: Diagnosis not present

## 2015-04-21 DIAGNOSIS — N2581 Secondary hyperparathyroidism of renal origin: Secondary | ICD-10-CM | POA: Diagnosis not present

## 2015-04-22 DIAGNOSIS — M792 Neuralgia and neuritis, unspecified: Secondary | ICD-10-CM | POA: Diagnosis not present

## 2015-04-22 DIAGNOSIS — L97529 Non-pressure chronic ulcer of other part of left foot with unspecified severity: Secondary | ICD-10-CM | POA: Diagnosis not present

## 2015-04-23 DIAGNOSIS — E119 Type 2 diabetes mellitus without complications: Secondary | ICD-10-CM | POA: Diagnosis not present

## 2015-04-23 DIAGNOSIS — N2581 Secondary hyperparathyroidism of renal origin: Secondary | ICD-10-CM | POA: Diagnosis not present

## 2015-04-23 DIAGNOSIS — N186 End stage renal disease: Secondary | ICD-10-CM | POA: Diagnosis not present

## 2015-04-23 DIAGNOSIS — D631 Anemia in chronic kidney disease: Secondary | ICD-10-CM | POA: Diagnosis not present

## 2015-04-23 DIAGNOSIS — D509 Iron deficiency anemia, unspecified: Secondary | ICD-10-CM | POA: Diagnosis not present

## 2015-04-25 DIAGNOSIS — D509 Iron deficiency anemia, unspecified: Secondary | ICD-10-CM | POA: Diagnosis not present

## 2015-04-25 DIAGNOSIS — N2581 Secondary hyperparathyroidism of renal origin: Secondary | ICD-10-CM | POA: Diagnosis not present

## 2015-04-25 DIAGNOSIS — D631 Anemia in chronic kidney disease: Secondary | ICD-10-CM | POA: Diagnosis not present

## 2015-04-25 DIAGNOSIS — N186 End stage renal disease: Secondary | ICD-10-CM | POA: Diagnosis not present

## 2015-04-25 DIAGNOSIS — E119 Type 2 diabetes mellitus without complications: Secondary | ICD-10-CM | POA: Diagnosis not present

## 2015-04-28 DIAGNOSIS — N2581 Secondary hyperparathyroidism of renal origin: Secondary | ICD-10-CM | POA: Diagnosis not present

## 2015-04-28 DIAGNOSIS — N186 End stage renal disease: Secondary | ICD-10-CM | POA: Diagnosis not present

## 2015-04-28 DIAGNOSIS — D509 Iron deficiency anemia, unspecified: Secondary | ICD-10-CM | POA: Diagnosis not present

## 2015-04-28 DIAGNOSIS — D631 Anemia in chronic kidney disease: Secondary | ICD-10-CM | POA: Diagnosis not present

## 2015-04-28 DIAGNOSIS — E119 Type 2 diabetes mellitus without complications: Secondary | ICD-10-CM | POA: Diagnosis not present

## 2015-04-29 DIAGNOSIS — M792 Neuralgia and neuritis, unspecified: Secondary | ICD-10-CM | POA: Diagnosis not present

## 2015-04-30 DIAGNOSIS — D509 Iron deficiency anemia, unspecified: Secondary | ICD-10-CM | POA: Diagnosis not present

## 2015-04-30 DIAGNOSIS — D631 Anemia in chronic kidney disease: Secondary | ICD-10-CM | POA: Diagnosis not present

## 2015-04-30 DIAGNOSIS — N186 End stage renal disease: Secondary | ICD-10-CM | POA: Diagnosis not present

## 2015-04-30 DIAGNOSIS — E119 Type 2 diabetes mellitus without complications: Secondary | ICD-10-CM | POA: Diagnosis not present

## 2015-04-30 DIAGNOSIS — N2581 Secondary hyperparathyroidism of renal origin: Secondary | ICD-10-CM | POA: Diagnosis not present

## 2015-05-02 DIAGNOSIS — D509 Iron deficiency anemia, unspecified: Secondary | ICD-10-CM | POA: Diagnosis not present

## 2015-05-02 DIAGNOSIS — N186 End stage renal disease: Secondary | ICD-10-CM | POA: Diagnosis not present

## 2015-05-02 DIAGNOSIS — N2581 Secondary hyperparathyroidism of renal origin: Secondary | ICD-10-CM | POA: Diagnosis not present

## 2015-05-02 DIAGNOSIS — D631 Anemia in chronic kidney disease: Secondary | ICD-10-CM | POA: Diagnosis not present

## 2015-05-02 DIAGNOSIS — E119 Type 2 diabetes mellitus without complications: Secondary | ICD-10-CM | POA: Diagnosis not present

## 2015-05-05 DIAGNOSIS — D631 Anemia in chronic kidney disease: Secondary | ICD-10-CM | POA: Diagnosis not present

## 2015-05-05 DIAGNOSIS — D509 Iron deficiency anemia, unspecified: Secondary | ICD-10-CM | POA: Diagnosis not present

## 2015-05-05 DIAGNOSIS — N2581 Secondary hyperparathyroidism of renal origin: Secondary | ICD-10-CM | POA: Diagnosis not present

## 2015-05-05 DIAGNOSIS — N186 End stage renal disease: Secondary | ICD-10-CM | POA: Diagnosis not present

## 2015-05-05 DIAGNOSIS — E119 Type 2 diabetes mellitus without complications: Secondary | ICD-10-CM | POA: Diagnosis not present

## 2015-05-06 DIAGNOSIS — E1129 Type 2 diabetes mellitus with other diabetic kidney complication: Secondary | ICD-10-CM | POA: Diagnosis not present

## 2015-05-06 DIAGNOSIS — Z992 Dependence on renal dialysis: Secondary | ICD-10-CM | POA: Diagnosis not present

## 2015-05-06 DIAGNOSIS — N186 End stage renal disease: Secondary | ICD-10-CM | POA: Diagnosis not present

## 2015-05-07 DIAGNOSIS — D631 Anemia in chronic kidney disease: Secondary | ICD-10-CM | POA: Diagnosis not present

## 2015-05-07 DIAGNOSIS — N2581 Secondary hyperparathyroidism of renal origin: Secondary | ICD-10-CM | POA: Diagnosis not present

## 2015-05-07 DIAGNOSIS — D509 Iron deficiency anemia, unspecified: Secondary | ICD-10-CM | POA: Diagnosis not present

## 2015-05-07 DIAGNOSIS — E119 Type 2 diabetes mellitus without complications: Secondary | ICD-10-CM | POA: Diagnosis not present

## 2015-05-07 DIAGNOSIS — N186 End stage renal disease: Secondary | ICD-10-CM | POA: Diagnosis not present

## 2015-05-09 DIAGNOSIS — N2581 Secondary hyperparathyroidism of renal origin: Secondary | ICD-10-CM | POA: Diagnosis not present

## 2015-05-09 DIAGNOSIS — E119 Type 2 diabetes mellitus without complications: Secondary | ICD-10-CM | POA: Diagnosis not present

## 2015-05-09 DIAGNOSIS — N186 End stage renal disease: Secondary | ICD-10-CM | POA: Diagnosis not present

## 2015-05-09 DIAGNOSIS — D509 Iron deficiency anemia, unspecified: Secondary | ICD-10-CM | POA: Diagnosis not present

## 2015-05-09 DIAGNOSIS — D631 Anemia in chronic kidney disease: Secondary | ICD-10-CM | POA: Diagnosis not present

## 2015-05-12 DIAGNOSIS — E119 Type 2 diabetes mellitus without complications: Secondary | ICD-10-CM | POA: Diagnosis not present

## 2015-05-12 DIAGNOSIS — N186 End stage renal disease: Secondary | ICD-10-CM | POA: Diagnosis not present

## 2015-05-12 DIAGNOSIS — D509 Iron deficiency anemia, unspecified: Secondary | ICD-10-CM | POA: Diagnosis not present

## 2015-05-12 DIAGNOSIS — D631 Anemia in chronic kidney disease: Secondary | ICD-10-CM | POA: Diagnosis not present

## 2015-05-12 DIAGNOSIS — N2581 Secondary hyperparathyroidism of renal origin: Secondary | ICD-10-CM | POA: Diagnosis not present

## 2015-05-13 DIAGNOSIS — M792 Neuralgia and neuritis, unspecified: Secondary | ICD-10-CM | POA: Diagnosis not present

## 2015-05-14 DIAGNOSIS — N2581 Secondary hyperparathyroidism of renal origin: Secondary | ICD-10-CM | POA: Diagnosis not present

## 2015-05-14 DIAGNOSIS — E119 Type 2 diabetes mellitus without complications: Secondary | ICD-10-CM | POA: Diagnosis not present

## 2015-05-14 DIAGNOSIS — D631 Anemia in chronic kidney disease: Secondary | ICD-10-CM | POA: Diagnosis not present

## 2015-05-14 DIAGNOSIS — D509 Iron deficiency anemia, unspecified: Secondary | ICD-10-CM | POA: Diagnosis not present

## 2015-05-14 DIAGNOSIS — N186 End stage renal disease: Secondary | ICD-10-CM | POA: Diagnosis not present

## 2015-05-16 DIAGNOSIS — N186 End stage renal disease: Secondary | ICD-10-CM | POA: Diagnosis not present

## 2015-05-16 DIAGNOSIS — D631 Anemia in chronic kidney disease: Secondary | ICD-10-CM | POA: Diagnosis not present

## 2015-05-16 DIAGNOSIS — E119 Type 2 diabetes mellitus without complications: Secondary | ICD-10-CM | POA: Diagnosis not present

## 2015-05-16 DIAGNOSIS — D509 Iron deficiency anemia, unspecified: Secondary | ICD-10-CM | POA: Diagnosis not present

## 2015-05-16 DIAGNOSIS — N2581 Secondary hyperparathyroidism of renal origin: Secondary | ICD-10-CM | POA: Diagnosis not present

## 2015-05-19 DIAGNOSIS — E119 Type 2 diabetes mellitus without complications: Secondary | ICD-10-CM | POA: Diagnosis not present

## 2015-05-19 DIAGNOSIS — N186 End stage renal disease: Secondary | ICD-10-CM | POA: Diagnosis not present

## 2015-05-19 DIAGNOSIS — N2581 Secondary hyperparathyroidism of renal origin: Secondary | ICD-10-CM | POA: Diagnosis not present

## 2015-05-19 DIAGNOSIS — D631 Anemia in chronic kidney disease: Secondary | ICD-10-CM | POA: Diagnosis not present

## 2015-05-19 DIAGNOSIS — D509 Iron deficiency anemia, unspecified: Secondary | ICD-10-CM | POA: Diagnosis not present

## 2015-05-21 DIAGNOSIS — N186 End stage renal disease: Secondary | ICD-10-CM | POA: Diagnosis not present

## 2015-05-21 DIAGNOSIS — D631 Anemia in chronic kidney disease: Secondary | ICD-10-CM | POA: Diagnosis not present

## 2015-05-21 DIAGNOSIS — E119 Type 2 diabetes mellitus without complications: Secondary | ICD-10-CM | POA: Diagnosis not present

## 2015-05-21 DIAGNOSIS — D509 Iron deficiency anemia, unspecified: Secondary | ICD-10-CM | POA: Diagnosis not present

## 2015-05-21 DIAGNOSIS — N2581 Secondary hyperparathyroidism of renal origin: Secondary | ICD-10-CM | POA: Diagnosis not present

## 2015-05-23 DIAGNOSIS — D631 Anemia in chronic kidney disease: Secondary | ICD-10-CM | POA: Diagnosis not present

## 2015-05-23 DIAGNOSIS — E119 Type 2 diabetes mellitus without complications: Secondary | ICD-10-CM | POA: Diagnosis not present

## 2015-05-23 DIAGNOSIS — N186 End stage renal disease: Secondary | ICD-10-CM | POA: Diagnosis not present

## 2015-05-23 DIAGNOSIS — D509 Iron deficiency anemia, unspecified: Secondary | ICD-10-CM | POA: Diagnosis not present

## 2015-05-23 DIAGNOSIS — N2581 Secondary hyperparathyroidism of renal origin: Secondary | ICD-10-CM | POA: Diagnosis not present

## 2015-05-26 DIAGNOSIS — N2581 Secondary hyperparathyroidism of renal origin: Secondary | ICD-10-CM | POA: Diagnosis not present

## 2015-05-26 DIAGNOSIS — E119 Type 2 diabetes mellitus without complications: Secondary | ICD-10-CM | POA: Diagnosis not present

## 2015-05-26 DIAGNOSIS — D631 Anemia in chronic kidney disease: Secondary | ICD-10-CM | POA: Diagnosis not present

## 2015-05-26 DIAGNOSIS — D509 Iron deficiency anemia, unspecified: Secondary | ICD-10-CM | POA: Diagnosis not present

## 2015-05-26 DIAGNOSIS — N186 End stage renal disease: Secondary | ICD-10-CM | POA: Diagnosis not present

## 2015-05-27 DIAGNOSIS — M792 Neuralgia and neuritis, unspecified: Secondary | ICD-10-CM | POA: Diagnosis not present

## 2015-05-28 DIAGNOSIS — D509 Iron deficiency anemia, unspecified: Secondary | ICD-10-CM | POA: Diagnosis not present

## 2015-05-28 DIAGNOSIS — E119 Type 2 diabetes mellitus without complications: Secondary | ICD-10-CM | POA: Diagnosis not present

## 2015-05-28 DIAGNOSIS — D631 Anemia in chronic kidney disease: Secondary | ICD-10-CM | POA: Diagnosis not present

## 2015-05-28 DIAGNOSIS — N2581 Secondary hyperparathyroidism of renal origin: Secondary | ICD-10-CM | POA: Diagnosis not present

## 2015-05-28 DIAGNOSIS — N186 End stage renal disease: Secondary | ICD-10-CM | POA: Diagnosis not present

## 2015-05-30 DIAGNOSIS — E119 Type 2 diabetes mellitus without complications: Secondary | ICD-10-CM | POA: Diagnosis not present

## 2015-05-30 DIAGNOSIS — N2581 Secondary hyperparathyroidism of renal origin: Secondary | ICD-10-CM | POA: Diagnosis not present

## 2015-05-30 DIAGNOSIS — D509 Iron deficiency anemia, unspecified: Secondary | ICD-10-CM | POA: Diagnosis not present

## 2015-05-30 DIAGNOSIS — D631 Anemia in chronic kidney disease: Secondary | ICD-10-CM | POA: Diagnosis not present

## 2015-05-30 DIAGNOSIS — N186 End stage renal disease: Secondary | ICD-10-CM | POA: Diagnosis not present

## 2015-06-02 DIAGNOSIS — N186 End stage renal disease: Secondary | ICD-10-CM | POA: Diagnosis not present

## 2015-06-02 DIAGNOSIS — E119 Type 2 diabetes mellitus without complications: Secondary | ICD-10-CM | POA: Diagnosis not present

## 2015-06-02 DIAGNOSIS — N2581 Secondary hyperparathyroidism of renal origin: Secondary | ICD-10-CM | POA: Diagnosis not present

## 2015-06-02 DIAGNOSIS — D509 Iron deficiency anemia, unspecified: Secondary | ICD-10-CM | POA: Diagnosis not present

## 2015-06-02 DIAGNOSIS — D631 Anemia in chronic kidney disease: Secondary | ICD-10-CM | POA: Diagnosis not present

## 2015-06-04 DIAGNOSIS — N186 End stage renal disease: Secondary | ICD-10-CM | POA: Diagnosis not present

## 2015-06-04 DIAGNOSIS — D631 Anemia in chronic kidney disease: Secondary | ICD-10-CM | POA: Diagnosis not present

## 2015-06-04 DIAGNOSIS — D509 Iron deficiency anemia, unspecified: Secondary | ICD-10-CM | POA: Diagnosis not present

## 2015-06-04 DIAGNOSIS — E119 Type 2 diabetes mellitus without complications: Secondary | ICD-10-CM | POA: Diagnosis not present

## 2015-06-04 DIAGNOSIS — N2581 Secondary hyperparathyroidism of renal origin: Secondary | ICD-10-CM | POA: Diagnosis not present

## 2015-06-06 DIAGNOSIS — D631 Anemia in chronic kidney disease: Secondary | ICD-10-CM | POA: Diagnosis not present

## 2015-06-06 DIAGNOSIS — E1129 Type 2 diabetes mellitus with other diabetic kidney complication: Secondary | ICD-10-CM | POA: Diagnosis not present

## 2015-06-06 DIAGNOSIS — D509 Iron deficiency anemia, unspecified: Secondary | ICD-10-CM | POA: Diagnosis not present

## 2015-06-06 DIAGNOSIS — Z992 Dependence on renal dialysis: Secondary | ICD-10-CM | POA: Diagnosis not present

## 2015-06-06 DIAGNOSIS — N186 End stage renal disease: Secondary | ICD-10-CM | POA: Diagnosis not present

## 2015-06-06 DIAGNOSIS — E119 Type 2 diabetes mellitus without complications: Secondary | ICD-10-CM | POA: Diagnosis not present

## 2015-06-06 DIAGNOSIS — N2581 Secondary hyperparathyroidism of renal origin: Secondary | ICD-10-CM | POA: Diagnosis not present

## 2015-06-09 DIAGNOSIS — D509 Iron deficiency anemia, unspecified: Secondary | ICD-10-CM | POA: Diagnosis not present

## 2015-06-09 DIAGNOSIS — E119 Type 2 diabetes mellitus without complications: Secondary | ICD-10-CM | POA: Diagnosis not present

## 2015-06-09 DIAGNOSIS — N2581 Secondary hyperparathyroidism of renal origin: Secondary | ICD-10-CM | POA: Diagnosis not present

## 2015-06-09 DIAGNOSIS — N186 End stage renal disease: Secondary | ICD-10-CM | POA: Diagnosis not present

## 2015-06-09 DIAGNOSIS — D631 Anemia in chronic kidney disease: Secondary | ICD-10-CM | POA: Diagnosis not present

## 2015-06-10 DIAGNOSIS — M792 Neuralgia and neuritis, unspecified: Secondary | ICD-10-CM | POA: Diagnosis not present

## 2015-06-11 DIAGNOSIS — E119 Type 2 diabetes mellitus without complications: Secondary | ICD-10-CM | POA: Diagnosis not present

## 2015-06-11 DIAGNOSIS — N186 End stage renal disease: Secondary | ICD-10-CM | POA: Diagnosis not present

## 2015-06-11 DIAGNOSIS — N2581 Secondary hyperparathyroidism of renal origin: Secondary | ICD-10-CM | POA: Diagnosis not present

## 2015-06-11 DIAGNOSIS — D631 Anemia in chronic kidney disease: Secondary | ICD-10-CM | POA: Diagnosis not present

## 2015-06-11 DIAGNOSIS — D509 Iron deficiency anemia, unspecified: Secondary | ICD-10-CM | POA: Diagnosis not present

## 2015-06-13 DIAGNOSIS — D631 Anemia in chronic kidney disease: Secondary | ICD-10-CM | POA: Diagnosis not present

## 2015-06-13 DIAGNOSIS — N186 End stage renal disease: Secondary | ICD-10-CM | POA: Diagnosis not present

## 2015-06-13 DIAGNOSIS — D509 Iron deficiency anemia, unspecified: Secondary | ICD-10-CM | POA: Diagnosis not present

## 2015-06-13 DIAGNOSIS — E119 Type 2 diabetes mellitus without complications: Secondary | ICD-10-CM | POA: Diagnosis not present

## 2015-06-13 DIAGNOSIS — N2581 Secondary hyperparathyroidism of renal origin: Secondary | ICD-10-CM | POA: Diagnosis not present

## 2015-06-16 DIAGNOSIS — N186 End stage renal disease: Secondary | ICD-10-CM | POA: Diagnosis not present

## 2015-06-16 DIAGNOSIS — N2581 Secondary hyperparathyroidism of renal origin: Secondary | ICD-10-CM | POA: Diagnosis not present

## 2015-06-16 DIAGNOSIS — E119 Type 2 diabetes mellitus without complications: Secondary | ICD-10-CM | POA: Diagnosis not present

## 2015-06-16 DIAGNOSIS — D509 Iron deficiency anemia, unspecified: Secondary | ICD-10-CM | POA: Diagnosis not present

## 2015-06-16 DIAGNOSIS — D631 Anemia in chronic kidney disease: Secondary | ICD-10-CM | POA: Diagnosis not present

## 2015-06-17 ENCOUNTER — Telehealth: Payer: Self-pay | Admitting: *Deleted

## 2015-06-17 ENCOUNTER — Other Ambulatory Visit: Payer: Self-pay | Admitting: *Deleted

## 2015-06-17 MED ORDER — ANASTROZOLE 1 MG PO TABS: 1.0000 mg | ORAL_TABLET | Freq: Every day | ORAL | Status: DC

## 2015-06-17 NOTE — Telephone Encounter (Signed)
Wanda white POA calling for a refill on arimidex.

## 2015-06-18 DIAGNOSIS — D509 Iron deficiency anemia, unspecified: Secondary | ICD-10-CM | POA: Diagnosis not present

## 2015-06-18 DIAGNOSIS — E119 Type 2 diabetes mellitus without complications: Secondary | ICD-10-CM | POA: Diagnosis not present

## 2015-06-18 DIAGNOSIS — D631 Anemia in chronic kidney disease: Secondary | ICD-10-CM | POA: Diagnosis not present

## 2015-06-18 DIAGNOSIS — N186 End stage renal disease: Secondary | ICD-10-CM | POA: Diagnosis not present

## 2015-06-18 DIAGNOSIS — N2581 Secondary hyperparathyroidism of renal origin: Secondary | ICD-10-CM | POA: Diagnosis not present

## 2015-06-20 DIAGNOSIS — D509 Iron deficiency anemia, unspecified: Secondary | ICD-10-CM | POA: Diagnosis not present

## 2015-06-20 DIAGNOSIS — D631 Anemia in chronic kidney disease: Secondary | ICD-10-CM | POA: Diagnosis not present

## 2015-06-20 DIAGNOSIS — N2581 Secondary hyperparathyroidism of renal origin: Secondary | ICD-10-CM | POA: Diagnosis not present

## 2015-06-20 DIAGNOSIS — N186 End stage renal disease: Secondary | ICD-10-CM | POA: Diagnosis not present

## 2015-06-20 DIAGNOSIS — E119 Type 2 diabetes mellitus without complications: Secondary | ICD-10-CM | POA: Diagnosis not present

## 2015-06-23 DIAGNOSIS — D509 Iron deficiency anemia, unspecified: Secondary | ICD-10-CM | POA: Diagnosis not present

## 2015-06-23 DIAGNOSIS — E119 Type 2 diabetes mellitus without complications: Secondary | ICD-10-CM | POA: Diagnosis not present

## 2015-06-23 DIAGNOSIS — D631 Anemia in chronic kidney disease: Secondary | ICD-10-CM | POA: Diagnosis not present

## 2015-06-23 DIAGNOSIS — N186 End stage renal disease: Secondary | ICD-10-CM | POA: Diagnosis not present

## 2015-06-23 DIAGNOSIS — N2581 Secondary hyperparathyroidism of renal origin: Secondary | ICD-10-CM | POA: Diagnosis not present

## 2015-06-24 DIAGNOSIS — M792 Neuralgia and neuritis, unspecified: Secondary | ICD-10-CM | POA: Diagnosis not present

## 2015-06-25 DIAGNOSIS — N2581 Secondary hyperparathyroidism of renal origin: Secondary | ICD-10-CM | POA: Diagnosis not present

## 2015-06-25 DIAGNOSIS — D509 Iron deficiency anemia, unspecified: Secondary | ICD-10-CM | POA: Diagnosis not present

## 2015-06-25 DIAGNOSIS — D631 Anemia in chronic kidney disease: Secondary | ICD-10-CM | POA: Diagnosis not present

## 2015-06-25 DIAGNOSIS — N186 End stage renal disease: Secondary | ICD-10-CM | POA: Diagnosis not present

## 2015-06-25 DIAGNOSIS — E119 Type 2 diabetes mellitus without complications: Secondary | ICD-10-CM | POA: Diagnosis not present

## 2015-06-27 DIAGNOSIS — N2581 Secondary hyperparathyroidism of renal origin: Secondary | ICD-10-CM | POA: Diagnosis not present

## 2015-06-27 DIAGNOSIS — N186 End stage renal disease: Secondary | ICD-10-CM | POA: Diagnosis not present

## 2015-06-27 DIAGNOSIS — D509 Iron deficiency anemia, unspecified: Secondary | ICD-10-CM | POA: Diagnosis not present

## 2015-06-27 DIAGNOSIS — D631 Anemia in chronic kidney disease: Secondary | ICD-10-CM | POA: Diagnosis not present

## 2015-06-27 DIAGNOSIS — E119 Type 2 diabetes mellitus without complications: Secondary | ICD-10-CM | POA: Diagnosis not present

## 2015-06-30 DIAGNOSIS — E119 Type 2 diabetes mellitus without complications: Secondary | ICD-10-CM | POA: Diagnosis not present

## 2015-06-30 DIAGNOSIS — D509 Iron deficiency anemia, unspecified: Secondary | ICD-10-CM | POA: Diagnosis not present

## 2015-06-30 DIAGNOSIS — N2581 Secondary hyperparathyroidism of renal origin: Secondary | ICD-10-CM | POA: Diagnosis not present

## 2015-06-30 DIAGNOSIS — N186 End stage renal disease: Secondary | ICD-10-CM | POA: Diagnosis not present

## 2015-06-30 DIAGNOSIS — D631 Anemia in chronic kidney disease: Secondary | ICD-10-CM | POA: Diagnosis not present

## 2015-07-02 DIAGNOSIS — D631 Anemia in chronic kidney disease: Secondary | ICD-10-CM | POA: Diagnosis not present

## 2015-07-02 DIAGNOSIS — D509 Iron deficiency anemia, unspecified: Secondary | ICD-10-CM | POA: Diagnosis not present

## 2015-07-02 DIAGNOSIS — E1129 Type 2 diabetes mellitus with other diabetic kidney complication: Secondary | ICD-10-CM | POA: Diagnosis not present

## 2015-07-02 DIAGNOSIS — N2581 Secondary hyperparathyroidism of renal origin: Secondary | ICD-10-CM | POA: Diagnosis not present

## 2015-07-02 DIAGNOSIS — N186 End stage renal disease: Secondary | ICD-10-CM | POA: Diagnosis not present

## 2015-07-02 DIAGNOSIS — E119 Type 2 diabetes mellitus without complications: Secondary | ICD-10-CM | POA: Diagnosis not present

## 2015-07-04 DIAGNOSIS — E119 Type 2 diabetes mellitus without complications: Secondary | ICD-10-CM | POA: Diagnosis not present

## 2015-07-04 DIAGNOSIS — N2581 Secondary hyperparathyroidism of renal origin: Secondary | ICD-10-CM | POA: Diagnosis not present

## 2015-07-04 DIAGNOSIS — D631 Anemia in chronic kidney disease: Secondary | ICD-10-CM | POA: Diagnosis not present

## 2015-07-04 DIAGNOSIS — D509 Iron deficiency anemia, unspecified: Secondary | ICD-10-CM | POA: Diagnosis not present

## 2015-07-04 DIAGNOSIS — N186 End stage renal disease: Secondary | ICD-10-CM | POA: Diagnosis not present

## 2015-07-06 DIAGNOSIS — N186 End stage renal disease: Secondary | ICD-10-CM | POA: Diagnosis not present

## 2015-07-06 DIAGNOSIS — Z992 Dependence on renal dialysis: Secondary | ICD-10-CM | POA: Diagnosis not present

## 2015-07-06 DIAGNOSIS — E1129 Type 2 diabetes mellitus with other diabetic kidney complication: Secondary | ICD-10-CM | POA: Diagnosis not present

## 2015-07-07 DIAGNOSIS — D509 Iron deficiency anemia, unspecified: Secondary | ICD-10-CM | POA: Diagnosis not present

## 2015-07-07 DIAGNOSIS — N186 End stage renal disease: Secondary | ICD-10-CM | POA: Diagnosis not present

## 2015-07-07 DIAGNOSIS — N2581 Secondary hyperparathyroidism of renal origin: Secondary | ICD-10-CM | POA: Diagnosis not present

## 2015-07-07 DIAGNOSIS — D631 Anemia in chronic kidney disease: Secondary | ICD-10-CM | POA: Diagnosis not present

## 2015-07-07 DIAGNOSIS — E119 Type 2 diabetes mellitus without complications: Secondary | ICD-10-CM | POA: Diagnosis not present

## 2015-07-08 DIAGNOSIS — M792 Neuralgia and neuritis, unspecified: Secondary | ICD-10-CM | POA: Diagnosis not present

## 2015-07-09 DIAGNOSIS — D509 Iron deficiency anemia, unspecified: Secondary | ICD-10-CM | POA: Diagnosis not present

## 2015-07-09 DIAGNOSIS — N186 End stage renal disease: Secondary | ICD-10-CM | POA: Diagnosis not present

## 2015-07-09 DIAGNOSIS — E119 Type 2 diabetes mellitus without complications: Secondary | ICD-10-CM | POA: Diagnosis not present

## 2015-07-09 DIAGNOSIS — D631 Anemia in chronic kidney disease: Secondary | ICD-10-CM | POA: Diagnosis not present

## 2015-07-09 DIAGNOSIS — N2581 Secondary hyperparathyroidism of renal origin: Secondary | ICD-10-CM | POA: Diagnosis not present

## 2015-07-11 ENCOUNTER — Emergency Department (HOSPITAL_COMMUNITY)
Admission: EM | Admit: 2015-07-11 | Discharge: 2015-07-11 | Disposition: A | Discharge status: Home / Self Care | Payer: Medicare Other | Attending: Emergency Medicine | Admitting: Emergency Medicine

## 2015-07-11 ENCOUNTER — Encounter (HOSPITAL_COMMUNITY): Payer: Self-pay

## 2015-07-11 DIAGNOSIS — T8249XA Other complication of vascular dialysis catheter, initial encounter: Secondary | ICD-10-CM | POA: Diagnosis not present

## 2015-07-11 DIAGNOSIS — T829XXA Unspecified complication of cardiac and vascular prosthetic device, implant and graft, initial encounter: Secondary | ICD-10-CM

## 2015-07-11 DIAGNOSIS — Z87891 Personal history of nicotine dependence: Secondary | ICD-10-CM | POA: Diagnosis not present

## 2015-07-11 DIAGNOSIS — Y658 Other specified misadventures during surgical and medical care: Secondary | ICD-10-CM | POA: Insufficient documentation

## 2015-07-11 DIAGNOSIS — Z853 Personal history of malignant neoplasm of breast: Secondary | ICD-10-CM | POA: Insufficient documentation

## 2015-07-11 DIAGNOSIS — M1990 Unspecified osteoarthritis, unspecified site: Secondary | ICD-10-CM | POA: Insufficient documentation

## 2015-07-11 DIAGNOSIS — D509 Iron deficiency anemia, unspecified: Secondary | ICD-10-CM | POA: Diagnosis not present

## 2015-07-11 DIAGNOSIS — Z862 Personal history of diseases of the blood and blood-forming organs and certain disorders involving the immune mechanism: Secondary | ICD-10-CM | POA: Insufficient documentation

## 2015-07-11 DIAGNOSIS — N2581 Secondary hyperparathyroidism of renal origin: Secondary | ICD-10-CM | POA: Diagnosis not present

## 2015-07-11 DIAGNOSIS — T82838A Hemorrhage of vascular prosthetic devices, implants and grafts, initial encounter: Secondary | ICD-10-CM | POA: Diagnosis not present

## 2015-07-11 DIAGNOSIS — E213 Hyperparathyroidism, unspecified: Secondary | ICD-10-CM | POA: Insufficient documentation

## 2015-07-11 DIAGNOSIS — Z8619 Personal history of other infectious and parasitic diseases: Secondary | ICD-10-CM | POA: Insufficient documentation

## 2015-07-11 DIAGNOSIS — Z992 Dependence on renal dialysis: Secondary | ICD-10-CM | POA: Diagnosis not present

## 2015-07-11 DIAGNOSIS — I120 Hypertensive chronic kidney disease with stage 5 chronic kidney disease or end stage renal disease: Secondary | ICD-10-CM | POA: Diagnosis not present

## 2015-07-11 DIAGNOSIS — N186 End stage renal disease: Secondary | ICD-10-CM | POA: Insufficient documentation

## 2015-07-11 DIAGNOSIS — E1122 Type 2 diabetes mellitus with diabetic chronic kidney disease: Secondary | ICD-10-CM | POA: Insufficient documentation

## 2015-07-11 DIAGNOSIS — Z79899 Other long term (current) drug therapy: Secondary | ICD-10-CM | POA: Diagnosis not present

## 2015-07-11 DIAGNOSIS — D631 Anemia in chronic kidney disease: Secondary | ICD-10-CM | POA: Diagnosis not present

## 2015-07-11 DIAGNOSIS — E119 Type 2 diabetes mellitus without complications: Secondary | ICD-10-CM | POA: Diagnosis not present

## 2015-07-11 DIAGNOSIS — T83498A Other mechanical complication of other prosthetic devices, implants and grafts of genital tract, initial encounter: Secondary | ICD-10-CM | POA: Diagnosis not present

## 2015-07-11 DIAGNOSIS — T82898A Other specified complication of vascular prosthetic devices, implants and grafts, initial encounter: Secondary | ICD-10-CM | POA: Diagnosis not present

## 2015-07-11 MED ORDER — LIDOCAINE-EPINEPHRINE (PF) 2 %-1:200000 IJ SOLN
20.0000 mL | Freq: Once | INTRAMUSCULAR | Status: AC
  Administered 2015-07-11: 20 mL
  Filled 2015-07-11: qty 20

## 2015-07-11 NOTE — ED Provider Notes (Signed)
CSN: [id number]     Arrival date & time 07/11/15  1740 History   First MD Initiated Contact with Patient 07/11/15 1756     Chief Complaint  Patient presents with  . Vascular Access Problem     HPI  She presents for evaluation of a bleeding dialysis graft.  History of end-stage renal disease on routine Monday Wednesday Friday hemodialysis. Dialyzed today without difficulty. No complications or bleeding during the procedure.  After removal of the needle pressure was held for 90 minutes. there was continued until bleeding and patient presents here.  Past Medical History  Diagnosis Date  . Anemia   . PVD (peripheral vascular disease) ([place])     a. s/p peripheral angiogram on 07/10/14 with no PCI and cont med Rx  . Hyperparathyroidism   . Dyslipidemia   . Hypertension   . Hepatitis C   . Vascular disease   . DM (diabetes mellitus) ([place])   . ESRD (end stage renal disease) on dialysis ([place])     a. [place]: MWF (07/10/2014)  . Arthritis   . Bilateral breast cancer [facility])    Past Surgical History  Procedure Laterality Date  . Cataract extraction Right   . Abdominal hysterectomy    . Arteriovenous graft placement Left 03/20/09    "had right arm previously but couldn't use it anymore"  . Median nerve repair Left     "decompression"  . Posterior fusion cervical spine      "have 6 screws"  . Toe amputation Right     1,2nd toes  . Dg av dialysis graft declot or Left 01/27/11, 02/15/11, 03/15/11, 10/13/11    lua  . Thrombectomy and revision of arterioventous (av) goretex  graft Left 04/18/2009  . Uterine fibroid surgery    . Total mastectomy Right 08/07/2013    Procedure: RIGHT TOTAL MASTECTOMY;  Surgeon: [name], MD;  Location: [place];  Service: General;  Laterality: Right;  . Peripheral vascular catheterization N/A 07/10/2014    Procedure: Abdominal Aortogram;  Surgeon: Wellington Hampshire, MD;  Location: [place] INVASIVE CV LAB CUPID;  Service: Cardiovascular;  Laterality: N/A;  . Back  surgery    . Appendectomy    . Tonsillectomy    . Breast biopsy Bilateral   . Breast biopsy Right 08/08/2013    Procedure: Evacuation Hematoma Right Chest;  Surgeon: [name], MD;  Location: [place];  Service: General;  Laterality: Right;  . Evacuation breast hematoma  2001; 2015    left; right  . Mastectomy complete / simple Right 08/07/2013  . Mastectomy Left 1970's?   Family History  Problem Relation Age of Onset  . Cancer Mother   . Hypertension Mother    Social History  Substance Use Topics  . Smoking status: Former Smoker    Types: Cigarettes  . Smokeless tobacco: Never Used     Comment: "stopped smoking in 1969"  . Alcohol Use: 0.0 oz/week    0 Standard drinks or equivalent per week     Comment: "no alcohol since 1969"   OB History    No data available     Review of Systems  Constitutional: Negative for fever, chills, diaphoresis, appetite change and fatigue.  HENT: Negative for mouth sores, sore throat and trouble swallowing.   Eyes: Negative for visual disturbance.  Respiratory: Negative for cough, chest tightness, shortness of breath and wheezing.   Cardiovascular: Negative for chest pain.  Gastrointestinal: Negative for nausea, vomiting, abdominal pain, diarrhea and abdominal  distention.  Endocrine: Negative for polydipsia, polyphagia and polyuria.  Genitourinary: Negative for dysuria, frequency and hematuria.  Musculoskeletal: Negative for gait problem.  Skin: Negative for color change, pallor and rash.  Neurological: Negative for dizziness, syncope, light-headedness and headaches.  Hematological: Does not bruise/bleed easily.  Psychiatric/Behavioral: Negative for behavioral problems and confusion.      Allergies  Aspirin  Home Medications   Prior to Admission medications   Medication Sig Start Date End Date Taking? Authorizing Provider  amLODipine (NORVASC) 5 MG tablet Take 5 mg by mouth daily. 08/04/14  Yes Historical Provider, MD  anastrozole  (ARIMIDEX) 1 MG tablet Take 1 tablet (1 mg total) by mouth daily. 06/17/15  Yes Wyatt Portela, MD  ibuprofen (ADVIL,MOTRIN) 200 MG tablet Take 200 mg by mouth every 6 (six) hours as needed for fever, headache or mild pain.   Yes Historical Provider, MD  lanthanum (FOSRENOL) 1000 MG chewable tablet Chew 1,000 mg by mouth 2 (two) times daily with a meal.    Yes Historical Provider, MD  metoprolol succinate (TOPROL-XL) 50 MG 24 hr tablet take 1 tablet by mouth at bedtime for high blood pressure 01/01/15  Yes Historical Provider, MD  povidone-iodine (BETADINE) 10 % ointment Apply 1 application topically as needed for wound care.   Yes Historical Provider, MD  SENSIPAR 60 MG tablet Take 1 tablet by mouth daily. 05/13/14  Yes Historical Provider, MD   BP 166/57 mmHg  Pulse 66  Temp(Src) 98.3 F (36.8 C) (Oral)  Resp 16  Ht 5\' 5"  (1.651 m)  Wt 110 lb (49.896 kg)  BMI 18.31 kg/m2  SpO2 100% Physical Exam  Constitutional: She is oriented to person, place, and time. She appears well-developed and well-nourished. No distress.  HENT:  Head: Normocephalic.  Eyes: Conjunctivae are normal. Pupils are equal, round, and reactive to light. No scleral icterus.  Neck: Normal range of motion. Neck supple. No thyromegaly present.  Cardiovascular: Normal rate and regular rhythm.  Exam reveals no gallop and no friction rub.   No murmur heard. Pulmonary/Chest: Effort normal and breath sounds normal. No respiratory distress. She has no wheezes. She has no rales.  Abdominal: Soft. Bowel sounds are normal. She exhibits no distension. There is no tenderness. There is no rebound.  Musculoskeletal: Normal range of motion.  Neurological: She is alert and oriented to person, place, and time.  Skin: Skin is warm and dry. No rash noted.  AV graft and left upper arm with good control and bruit. The dressing is removed there is a stream of not pulsatile, but almost constant pinpoint bleeding. This is high pressured. It leaves  the patient's arm and travels to 4 feet across the room.  Psychiatric: She has a normal mood and affect. Her behavior is normal.    ED Course  Procedures (including critical care time) Labs Review Labs Reviewed - No data to display  Imaging Review No results found. I have personally reviewed and evaluated these images and lab results as part of my medical decision-making.   EKG Interpretation None      MDM   Final diagnoses:  Renal dialysis device, implant, or graft complication    After Betadine prep, local anesthesia with 1% lidocaine with epinephrine placed circumferentially about the area of pulsatile bleeding. 2 opposing was all mattress sutures placed a pursestring fashion. This immediately stopped bleeding. Gauze dressing applied. She has palpable thrill and bruit proximal and distal to the area suture placement. I've had her in the emergency  department for an additional 2 hours. Take the dressing down a 1 hour, and at 2 hours time. There is no additional bleeding.  Remains patent with thrill and bruit. I think she is appropriate for discharge home. I put a simple gauze dressing overlying this. Astra return if any blood staining of the gauze. A vascular follow-up with her Julien Nordmann on Monday prior to dialysis.    Tanna Furry, MD 07/11/15 [phone]

## 2015-07-11 NOTE — Discharge Instructions (Signed)
Turn tonight with any bleeding noted around the dressing over your graft.

## 2015-07-11 NOTE — ED Notes (Signed)
Pt arrives EMS from Dialysis center where they were unable to get her graft access site to stop bleeding after her dialysis was complete. dDirect reassure held for 90 minutes by dialysis person. Preassure dressing at [id number] with no new bleeding noted on arrival.

## 2015-07-11 NOTE — ED Notes (Signed)
Dr. James at bedside  

## 2015-07-14 DIAGNOSIS — E119 Type 2 diabetes mellitus without complications: Secondary | ICD-10-CM | POA: Diagnosis not present

## 2015-07-14 DIAGNOSIS — I871 Compression of vein: Secondary | ICD-10-CM | POA: Diagnosis not present

## 2015-07-14 DIAGNOSIS — D631 Anemia in chronic kidney disease: Secondary | ICD-10-CM | POA: Diagnosis not present

## 2015-07-14 DIAGNOSIS — D509 Iron deficiency anemia, unspecified: Secondary | ICD-10-CM | POA: Diagnosis not present

## 2015-07-14 DIAGNOSIS — Z992 Dependence on renal dialysis: Secondary | ICD-10-CM | POA: Diagnosis not present

## 2015-07-14 DIAGNOSIS — T82858D Stenosis of vascular prosthetic devices, implants and grafts, subsequent encounter: Secondary | ICD-10-CM | POA: Diagnosis not present

## 2015-07-14 DIAGNOSIS — N2581 Secondary hyperparathyroidism of renal origin: Secondary | ICD-10-CM | POA: Diagnosis not present

## 2015-07-14 DIAGNOSIS — N186 End stage renal disease: Secondary | ICD-10-CM | POA: Diagnosis not present

## 2015-07-16 DIAGNOSIS — N186 End stage renal disease: Secondary | ICD-10-CM | POA: Diagnosis not present

## 2015-07-16 DIAGNOSIS — E119 Type 2 diabetes mellitus without complications: Secondary | ICD-10-CM | POA: Diagnosis not present

## 2015-07-16 DIAGNOSIS — N2581 Secondary hyperparathyroidism of renal origin: Secondary | ICD-10-CM | POA: Diagnosis not present

## 2015-07-16 DIAGNOSIS — D631 Anemia in chronic kidney disease: Secondary | ICD-10-CM | POA: Diagnosis not present

## 2015-07-16 DIAGNOSIS — D509 Iron deficiency anemia, unspecified: Secondary | ICD-10-CM | POA: Diagnosis not present

## 2015-07-18 DIAGNOSIS — E119 Type 2 diabetes mellitus without complications: Secondary | ICD-10-CM | POA: Diagnosis not present

## 2015-07-18 DIAGNOSIS — N186 End stage renal disease: Secondary | ICD-10-CM | POA: Diagnosis not present

## 2015-07-18 DIAGNOSIS — D631 Anemia in chronic kidney disease: Secondary | ICD-10-CM | POA: Diagnosis not present

## 2015-07-18 DIAGNOSIS — D509 Iron deficiency anemia, unspecified: Secondary | ICD-10-CM | POA: Diagnosis not present

## 2015-07-18 DIAGNOSIS — N2581 Secondary hyperparathyroidism of renal origin: Secondary | ICD-10-CM | POA: Diagnosis not present

## 2015-07-21 DIAGNOSIS — N186 End stage renal disease: Secondary | ICD-10-CM | POA: Diagnosis not present

## 2015-07-21 DIAGNOSIS — N2581 Secondary hyperparathyroidism of renal origin: Secondary | ICD-10-CM | POA: Diagnosis not present

## 2015-07-21 DIAGNOSIS — D631 Anemia in chronic kidney disease: Secondary | ICD-10-CM | POA: Diagnosis not present

## 2015-07-21 DIAGNOSIS — D509 Iron deficiency anemia, unspecified: Secondary | ICD-10-CM | POA: Diagnosis not present

## 2015-07-21 DIAGNOSIS — E119 Type 2 diabetes mellitus without complications: Secondary | ICD-10-CM | POA: Diagnosis not present

## 2015-07-22 DIAGNOSIS — L97522 Non-pressure chronic ulcer of other part of left foot with fat layer exposed: Secondary | ICD-10-CM | POA: Diagnosis not present

## 2015-07-23 DIAGNOSIS — E119 Type 2 diabetes mellitus without complications: Secondary | ICD-10-CM | POA: Diagnosis not present

## 2015-07-23 DIAGNOSIS — D509 Iron deficiency anemia, unspecified: Secondary | ICD-10-CM | POA: Diagnosis not present

## 2015-07-23 DIAGNOSIS — D631 Anemia in chronic kidney disease: Secondary | ICD-10-CM | POA: Diagnosis not present

## 2015-07-23 DIAGNOSIS — N186 End stage renal disease: Secondary | ICD-10-CM | POA: Diagnosis not present

## 2015-07-23 DIAGNOSIS — N2581 Secondary hyperparathyroidism of renal origin: Secondary | ICD-10-CM | POA: Diagnosis not present

## 2015-07-25 DIAGNOSIS — D509 Iron deficiency anemia, unspecified: Secondary | ICD-10-CM | POA: Diagnosis not present

## 2015-07-25 DIAGNOSIS — E119 Type 2 diabetes mellitus without complications: Secondary | ICD-10-CM | POA: Diagnosis not present

## 2015-07-25 DIAGNOSIS — D631 Anemia in chronic kidney disease: Secondary | ICD-10-CM | POA: Diagnosis not present

## 2015-07-25 DIAGNOSIS — N2581 Secondary hyperparathyroidism of renal origin: Secondary | ICD-10-CM | POA: Diagnosis not present

## 2015-07-25 DIAGNOSIS — N186 End stage renal disease: Secondary | ICD-10-CM | POA: Diagnosis not present

## 2015-07-28 DIAGNOSIS — N186 End stage renal disease: Secondary | ICD-10-CM | POA: Diagnosis not present

## 2015-07-28 DIAGNOSIS — D509 Iron deficiency anemia, unspecified: Secondary | ICD-10-CM | POA: Diagnosis not present

## 2015-07-28 DIAGNOSIS — N2581 Secondary hyperparathyroidism of renal origin: Secondary | ICD-10-CM | POA: Diagnosis not present

## 2015-07-28 DIAGNOSIS — D631 Anemia in chronic kidney disease: Secondary | ICD-10-CM | POA: Diagnosis not present

## 2015-07-28 DIAGNOSIS — E119 Type 2 diabetes mellitus without complications: Secondary | ICD-10-CM | POA: Diagnosis not present

## 2015-07-30 DIAGNOSIS — D631 Anemia in chronic kidney disease: Secondary | ICD-10-CM | POA: Diagnosis not present

## 2015-07-30 DIAGNOSIS — N2581 Secondary hyperparathyroidism of renal origin: Secondary | ICD-10-CM | POA: Diagnosis not present

## 2015-07-30 DIAGNOSIS — N186 End stage renal disease: Secondary | ICD-10-CM | POA: Diagnosis not present

## 2015-07-30 DIAGNOSIS — D509 Iron deficiency anemia, unspecified: Secondary | ICD-10-CM | POA: Diagnosis not present

## 2015-07-30 DIAGNOSIS — E119 Type 2 diabetes mellitus without complications: Secondary | ICD-10-CM | POA: Diagnosis not present

## 2015-08-01 DIAGNOSIS — D631 Anemia in chronic kidney disease: Secondary | ICD-10-CM | POA: Diagnosis not present

## 2015-08-01 DIAGNOSIS — E119 Type 2 diabetes mellitus without complications: Secondary | ICD-10-CM | POA: Diagnosis not present

## 2015-08-01 DIAGNOSIS — D509 Iron deficiency anemia, unspecified: Secondary | ICD-10-CM | POA: Diagnosis not present

## 2015-08-01 DIAGNOSIS — N186 End stage renal disease: Secondary | ICD-10-CM | POA: Diagnosis not present

## 2015-08-01 DIAGNOSIS — N2581 Secondary hyperparathyroidism of renal origin: Secondary | ICD-10-CM | POA: Diagnosis not present

## 2015-08-04 DIAGNOSIS — N2581 Secondary hyperparathyroidism of renal origin: Secondary | ICD-10-CM | POA: Diagnosis not present

## 2015-08-04 DIAGNOSIS — D509 Iron deficiency anemia, unspecified: Secondary | ICD-10-CM | POA: Diagnosis not present

## 2015-08-04 DIAGNOSIS — E119 Type 2 diabetes mellitus without complications: Secondary | ICD-10-CM | POA: Diagnosis not present

## 2015-08-04 DIAGNOSIS — N186 End stage renal disease: Secondary | ICD-10-CM | POA: Diagnosis not present

## 2015-08-04 DIAGNOSIS — D631 Anemia in chronic kidney disease: Secondary | ICD-10-CM | POA: Diagnosis not present

## 2015-08-06 DIAGNOSIS — N186 End stage renal disease: Secondary | ICD-10-CM | POA: Diagnosis not present

## 2015-08-06 DIAGNOSIS — Z992 Dependence on renal dialysis: Secondary | ICD-10-CM | POA: Diagnosis not present

## 2015-08-06 DIAGNOSIS — E119 Type 2 diabetes mellitus without complications: Secondary | ICD-10-CM | POA: Diagnosis not present

## 2015-08-06 DIAGNOSIS — E1129 Type 2 diabetes mellitus with other diabetic kidney complication: Secondary | ICD-10-CM | POA: Diagnosis not present

## 2015-08-06 DIAGNOSIS — N2581 Secondary hyperparathyroidism of renal origin: Secondary | ICD-10-CM | POA: Diagnosis not present

## 2015-08-06 DIAGNOSIS — D631 Anemia in chronic kidney disease: Secondary | ICD-10-CM | POA: Diagnosis not present

## 2015-08-06 DIAGNOSIS — D509 Iron deficiency anemia, unspecified: Secondary | ICD-10-CM | POA: Diagnosis not present

## 2015-08-08 ENCOUNTER — Emergency Department (HOSPITAL_COMMUNITY)
Admission: EM | Admit: 2015-08-08 | Discharge: 2015-08-08 | Disposition: A | Discharge status: Home / Self Care | Payer: Medicare Other | Attending: Emergency Medicine | Admitting: Emergency Medicine

## 2015-08-08 ENCOUNTER — Encounter (HOSPITAL_COMMUNITY): Payer: Self-pay

## 2015-08-08 DIAGNOSIS — D631 Anemia in chronic kidney disease: Secondary | ICD-10-CM | POA: Diagnosis not present

## 2015-08-08 DIAGNOSIS — I120 Hypertensive chronic kidney disease with stage 5 chronic kidney disease or end stage renal disease: Secondary | ICD-10-CM | POA: Diagnosis not present

## 2015-08-08 DIAGNOSIS — Y658 Other specified misadventures during surgical and medical care: Secondary | ICD-10-CM | POA: Insufficient documentation

## 2015-08-08 DIAGNOSIS — Z87891 Personal history of nicotine dependence: Secondary | ICD-10-CM | POA: Insufficient documentation

## 2015-08-08 DIAGNOSIS — E119 Type 2 diabetes mellitus without complications: Secondary | ICD-10-CM | POA: Diagnosis not present

## 2015-08-08 DIAGNOSIS — E213 Hyperparathyroidism, unspecified: Secondary | ICD-10-CM | POA: Insufficient documentation

## 2015-08-08 DIAGNOSIS — Z79899 Other long term (current) drug therapy: Secondary | ICD-10-CM | POA: Diagnosis not present

## 2015-08-08 DIAGNOSIS — Z853 Personal history of malignant neoplasm of breast: Secondary | ICD-10-CM | POA: Insufficient documentation

## 2015-08-08 DIAGNOSIS — R58 Hemorrhage, not elsewhere classified: Secondary | ICD-10-CM | POA: Diagnosis not present

## 2015-08-08 DIAGNOSIS — N186 End stage renal disease: Secondary | ICD-10-CM | POA: Diagnosis not present

## 2015-08-08 DIAGNOSIS — Z992 Dependence on renal dialysis: Secondary | ICD-10-CM | POA: Diagnosis not present

## 2015-08-08 DIAGNOSIS — E1122 Type 2 diabetes mellitus with diabetic chronic kidney disease: Secondary | ICD-10-CM | POA: Diagnosis not present

## 2015-08-08 DIAGNOSIS — Z862 Personal history of diseases of the blood and blood-forming organs and certain disorders involving the immune mechanism: Secondary | ICD-10-CM | POA: Insufficient documentation

## 2015-08-08 DIAGNOSIS — D509 Iron deficiency anemia, unspecified: Secondary | ICD-10-CM | POA: Diagnosis not present

## 2015-08-08 DIAGNOSIS — T829XXA Unspecified complication of cardiac and vascular prosthetic device, implant and graft, initial encounter: Secondary | ICD-10-CM

## 2015-08-08 DIAGNOSIS — N2581 Secondary hyperparathyroidism of renal origin: Secondary | ICD-10-CM | POA: Diagnosis not present

## 2015-08-08 DIAGNOSIS — Z8619 Personal history of other infectious and parasitic diseases: Secondary | ICD-10-CM | POA: Insufficient documentation

## 2015-08-08 DIAGNOSIS — T82838A Hemorrhage of vascular prosthetic devices, implants and grafts, initial encounter: Secondary | ICD-10-CM | POA: Diagnosis not present

## 2015-08-08 DIAGNOSIS — M1990 Unspecified osteoarthritis, unspecified site: Secondary | ICD-10-CM | POA: Insufficient documentation

## 2015-08-08 NOTE — ED Notes (Signed)
PTAR- pt coming from dialysis after her shunt would not stop bleeding. Bleeding is controlled on arrival. Pt received full session of dialysis.

## 2015-08-08 NOTE — ED Provider Notes (Signed)
CSN: [id number]     Arrival date & time    History   First MD Initiated Contact with Patient 08/08/15 1415     Chief Complaint  Patient presents with  . Vascular Access Problem     (Consider location/radiation/quality/duration/timing/severity/associated sxs/prior Treatment) HPI Comments: Patient is an 82 year old female with history of end-stage renal disease on routine Monday, Wednesday, Friday hemodialysis who presents with bleeding. Site to her left arm. Patient finished dialysis today and a dialysis center staff was unable to control bleeding. Bleeding was controlled with a pressure dressing at the center prior to transport here by ambulance. Patient denies any pain. Patient also denies any chest pain, short of breath, abdominal pain, nausea, vomiting, dysuria.  The history is provided by the patient.    Past Medical History  Diagnosis Date  . Anemia   . PVD (peripheral vascular disease) ([place])     a. s/p peripheral angiogram on 07/10/14 with no PCI and cont med Rx  . Hyperparathyroidism   . Dyslipidemia   . Hypertension   . Hepatitis C   . Vascular disease   . DM (diabetes mellitus) ([place])   . ESRD (end stage renal disease) on dialysis ([place])     a. [place]: MWF (07/10/2014)  . Arthritis   . Bilateral breast cancer [facility])    Past Surgical History  Procedure Laterality Date  . Cataract extraction Right   . Abdominal hysterectomy    . Arteriovenous graft placement Left 03/20/09    "had right arm previously but couldn't use it anymore"  . Median nerve repair Left     "decompression"  . Posterior fusion cervical spine      "have 6 screws"  . Toe amputation Right     1,2nd toes  . Dg av dialysis graft declot or Left 01/27/11, 02/15/11, 03/15/11, 10/13/11    lua  . Thrombectomy and revision of arterioventous (av) goretex  graft Left 04/18/2009  . Uterine fibroid surgery    . Total mastectomy Right 08/07/2013    Procedure: RIGHT TOTAL MASTECTOMY;  Surgeon: [name], MD;   Location: [place];  Service: General;  Laterality: Right;  . Peripheral vascular catheterization N/A 07/10/2014    Procedure: Abdominal Aortogram;  Surgeon: Wellington Hampshire, MD;  Location: [place] INVASIVE CV LAB CUPID;  Service: Cardiovascular;  Laterality: N/A;  . Back surgery    . Appendectomy    . Tonsillectomy    . Breast biopsy Bilateral   . Breast biopsy Right 08/08/2013    Procedure: Evacuation Hematoma Right Chest;  Surgeon: [name], MD;  Location: [place];  Service: General;  Laterality: Right;  . Evacuation breast hematoma  2001; 2015    left; right  . Mastectomy complete / simple Right 08/07/2013  . Mastectomy Left 1970's?   Family History  Problem Relation Age of Onset  . Cancer Mother   . Hypertension Mother    Social History  Substance Use Topics  . Smoking status: Former Smoker    Types: Cigarettes  . Smokeless tobacco: Never Used     Comment: "stopped smoking in 1969"  . Alcohol Use: 0.0 oz/week    0 Standard drinks or equivalent per week     Comment: "no alcohol since 1969"   OB History    No data available     Review of Systems  Constitutional: Negative for fever and chills.  HENT: Negative for facial swelling and sore throat.   Respiratory: Negative for shortness of breath.  Cardiovascular: Negative for chest pain.  Gastrointestinal: Negative for nausea, vomiting and abdominal pain.  Genitourinary: Negative for dysuria.  Musculoskeletal: Negative for back pain.  Skin: Positive for wound. Negative for rash.  Neurological: Negative for headaches.  Psychiatric/Behavioral: The patient is not nervous/anxious.       Allergies  Aspirin  Home Medications   Prior to Admission medications   Medication Sig Start Date End Date Taking? Authorizing Provider  amLODipine (NORVASC) 5 MG tablet Take 5 mg by mouth daily. 08/04/14   Historical Provider, MD  anastrozole (ARIMIDEX) 1 MG tablet Take 1 tablet (1 mg total) by mouth daily. 06/17/15   Wyatt Portela, MD   ibuprofen (ADVIL,MOTRIN) 200 MG tablet Take 200 mg by mouth every 6 (six) hours as needed for fever, headache or mild pain.    Historical Provider, MD  lanthanum (FOSRENOL) 1000 MG chewable tablet Chew 1,000 mg by mouth 2 (two) times daily with a meal.     Historical Provider, MD  metoprolol succinate (TOPROL-XL) 50 MG 24 hr tablet take 1 tablet by mouth at bedtime for high blood pressure 01/01/15   Historical Provider, MD  povidone-iodine (BETADINE) 10 % ointment Apply 1 application topically as needed for wound care.    Historical Provider, MD  SENSIPAR 60 MG tablet Take 1 tablet by mouth daily. 05/13/14   Historical Provider, MD   BP 174/48 mmHg  Pulse 70  Temp(Src) 97.9 F (36.6 C) (Oral)  Resp 18 Physical Exam  Constitutional: She appears well-developed and well-nourished. No distress.  HENT:  Head: Normocephalic and atraumatic.  Mouth/Throat: Oropharynx is clear and moist. No oropharyngeal exudate.  Eyes: Conjunctivae are normal. Pupils are equal, round, and reactive to light. Right eye exhibits no discharge. Left eye exhibits no discharge. No scleral icterus.  Neck: Normal range of motion. Neck supple. No thyromegaly present.  Cardiovascular: Normal rate, regular rhythm, normal heart sounds and intact distal pulses.  Exam reveals no gallop and no friction rub.   No murmur heard. Pulmonary/Chest: Effort normal and breath sounds normal. No stridor. No respiratory distress. She has no wheezes. She has no rales.  Abdominal: Soft. Bowel sounds are normal. She exhibits no distension. There is no tenderness. There is no rebound and no guarding.  Musculoskeletal: She exhibits no edema.  Lymphadenopathy:    She has no cervical adenopathy.  Neurological: She is alert. Coordination normal.  Skin: Skin is warm and dry. No rash noted. She is not diaphoretic. No pallor.  Dialysis graft present on left arm, palpable thrill and bruit, no active bleeding with removal of dressing placed by dialysis  center  Psychiatric: She has a normal mood and affect.  Nursing note and vitals reviewed.   ED Course  Procedures (including critical care time) Labs Review Labs Reviewed - No data to display  Imaging Review No results found. I have personally reviewed and evaluated these images and lab results as part of my medical decision-making.   EKG Interpretation None      MDM   Bleeding controlled prior to arrival. Dr. Eulis Foster and I removed dressing and visualized graft without any active bleeding. Palpable thrill and bruit. Replaced dressing and advised patient to keep dressing on until next dialysis treatment. Patient's vitals stable throughout ED course and discharged in satisfactory condition. Strict return precautions given.  Final diagnoses:  Complication of vascular access for dialysis, initial encounter [facility])       Frederica Kuster, PA-C 08/08/15 [place], MD 08/09/15 [phone]

## 2015-08-08 NOTE — Discharge Instructions (Signed)
Keep the dressing on your graft until your next dialysis treatment. Please return to emergency department if you develop any new or worsening symptoms, or if your bleeding begins again and you're unable to stop it.

## 2015-08-11 DIAGNOSIS — N186 End stage renal disease: Secondary | ICD-10-CM | POA: Diagnosis not present

## 2015-08-11 DIAGNOSIS — D509 Iron deficiency anemia, unspecified: Secondary | ICD-10-CM | POA: Diagnosis not present

## 2015-08-11 DIAGNOSIS — D631 Anemia in chronic kidney disease: Secondary | ICD-10-CM | POA: Diagnosis not present

## 2015-08-11 DIAGNOSIS — N2581 Secondary hyperparathyroidism of renal origin: Secondary | ICD-10-CM | POA: Diagnosis not present

## 2015-08-11 DIAGNOSIS — E119 Type 2 diabetes mellitus without complications: Secondary | ICD-10-CM | POA: Diagnosis not present

## 2015-08-13 DIAGNOSIS — N2581 Secondary hyperparathyroidism of renal origin: Secondary | ICD-10-CM | POA: Diagnosis not present

## 2015-08-13 DIAGNOSIS — N186 End stage renal disease: Secondary | ICD-10-CM | POA: Diagnosis not present

## 2015-08-13 DIAGNOSIS — D509 Iron deficiency anemia, unspecified: Secondary | ICD-10-CM | POA: Diagnosis not present

## 2015-08-13 DIAGNOSIS — E119 Type 2 diabetes mellitus without complications: Secondary | ICD-10-CM | POA: Diagnosis not present

## 2015-08-13 DIAGNOSIS — D631 Anemia in chronic kidney disease: Secondary | ICD-10-CM | POA: Diagnosis not present

## 2015-08-15 DIAGNOSIS — E119 Type 2 diabetes mellitus without complications: Secondary | ICD-10-CM | POA: Diagnosis not present

## 2015-08-15 DIAGNOSIS — N186 End stage renal disease: Secondary | ICD-10-CM | POA: Diagnosis not present

## 2015-08-15 DIAGNOSIS — D631 Anemia in chronic kidney disease: Secondary | ICD-10-CM | POA: Diagnosis not present

## 2015-08-15 DIAGNOSIS — D509 Iron deficiency anemia, unspecified: Secondary | ICD-10-CM | POA: Diagnosis not present

## 2015-08-15 DIAGNOSIS — N2581 Secondary hyperparathyroidism of renal origin: Secondary | ICD-10-CM | POA: Diagnosis not present

## 2015-08-18 DIAGNOSIS — D631 Anemia in chronic kidney disease: Secondary | ICD-10-CM | POA: Diagnosis not present

## 2015-08-18 DIAGNOSIS — E119 Type 2 diabetes mellitus without complications: Secondary | ICD-10-CM | POA: Diagnosis not present

## 2015-08-18 DIAGNOSIS — D509 Iron deficiency anemia, unspecified: Secondary | ICD-10-CM | POA: Diagnosis not present

## 2015-08-18 DIAGNOSIS — N2581 Secondary hyperparathyroidism of renal origin: Secondary | ICD-10-CM | POA: Diagnosis not present

## 2015-08-18 DIAGNOSIS — N186 End stage renal disease: Secondary | ICD-10-CM | POA: Diagnosis not present

## 2015-08-20 DIAGNOSIS — D509 Iron deficiency anemia, unspecified: Secondary | ICD-10-CM | POA: Diagnosis not present

## 2015-08-20 DIAGNOSIS — D631 Anemia in chronic kidney disease: Secondary | ICD-10-CM | POA: Diagnosis not present

## 2015-08-20 DIAGNOSIS — E119 Type 2 diabetes mellitus without complications: Secondary | ICD-10-CM | POA: Diagnosis not present

## 2015-08-20 DIAGNOSIS — N2581 Secondary hyperparathyroidism of renal origin: Secondary | ICD-10-CM | POA: Diagnosis not present

## 2015-08-20 DIAGNOSIS — N186 End stage renal disease: Secondary | ICD-10-CM | POA: Diagnosis not present

## 2015-08-22 DIAGNOSIS — D509 Iron deficiency anemia, unspecified: Secondary | ICD-10-CM | POA: Diagnosis not present

## 2015-08-22 DIAGNOSIS — E119 Type 2 diabetes mellitus without complications: Secondary | ICD-10-CM | POA: Diagnosis not present

## 2015-08-22 DIAGNOSIS — D631 Anemia in chronic kidney disease: Secondary | ICD-10-CM | POA: Diagnosis not present

## 2015-08-22 DIAGNOSIS — N2581 Secondary hyperparathyroidism of renal origin: Secondary | ICD-10-CM | POA: Diagnosis not present

## 2015-08-22 DIAGNOSIS — N186 End stage renal disease: Secondary | ICD-10-CM | POA: Diagnosis not present

## 2015-08-25 DIAGNOSIS — N186 End stage renal disease: Secondary | ICD-10-CM | POA: Diagnosis not present

## 2015-08-25 DIAGNOSIS — D631 Anemia in chronic kidney disease: Secondary | ICD-10-CM | POA: Diagnosis not present

## 2015-08-25 DIAGNOSIS — D509 Iron deficiency anemia, unspecified: Secondary | ICD-10-CM | POA: Diagnosis not present

## 2015-08-25 DIAGNOSIS — E119 Type 2 diabetes mellitus without complications: Secondary | ICD-10-CM | POA: Diagnosis not present

## 2015-08-25 DIAGNOSIS — N2581 Secondary hyperparathyroidism of renal origin: Secondary | ICD-10-CM | POA: Diagnosis not present

## 2015-08-27 DIAGNOSIS — N186 End stage renal disease: Secondary | ICD-10-CM | POA: Diagnosis not present

## 2015-08-27 DIAGNOSIS — D631 Anemia in chronic kidney disease: Secondary | ICD-10-CM | POA: Diagnosis not present

## 2015-08-27 DIAGNOSIS — N2581 Secondary hyperparathyroidism of renal origin: Secondary | ICD-10-CM | POA: Diagnosis not present

## 2015-08-27 DIAGNOSIS — D509 Iron deficiency anemia, unspecified: Secondary | ICD-10-CM | POA: Diagnosis not present

## 2015-08-27 DIAGNOSIS — E119 Type 2 diabetes mellitus without complications: Secondary | ICD-10-CM | POA: Diagnosis not present

## 2015-08-29 DIAGNOSIS — N2581 Secondary hyperparathyroidism of renal origin: Secondary | ICD-10-CM | POA: Diagnosis not present

## 2015-08-29 DIAGNOSIS — D509 Iron deficiency anemia, unspecified: Secondary | ICD-10-CM | POA: Diagnosis not present

## 2015-08-29 DIAGNOSIS — D631 Anemia in chronic kidney disease: Secondary | ICD-10-CM | POA: Diagnosis not present

## 2015-08-29 DIAGNOSIS — E119 Type 2 diabetes mellitus without complications: Secondary | ICD-10-CM | POA: Diagnosis not present

## 2015-08-29 DIAGNOSIS — N186 End stage renal disease: Secondary | ICD-10-CM | POA: Diagnosis not present

## 2015-09-01 DIAGNOSIS — D631 Anemia in chronic kidney disease: Secondary | ICD-10-CM | POA: Diagnosis not present

## 2015-09-01 DIAGNOSIS — E119 Type 2 diabetes mellitus without complications: Secondary | ICD-10-CM | POA: Diagnosis not present

## 2015-09-01 DIAGNOSIS — N2581 Secondary hyperparathyroidism of renal origin: Secondary | ICD-10-CM | POA: Diagnosis not present

## 2015-09-01 DIAGNOSIS — N186 End stage renal disease: Secondary | ICD-10-CM | POA: Diagnosis not present

## 2015-09-01 DIAGNOSIS — D509 Iron deficiency anemia, unspecified: Secondary | ICD-10-CM | POA: Diagnosis not present

## 2015-09-02 IMAGING — CR DG CHEST 2V
3 series · 3 of 3 positions shown · non-contrast
Comparison: Acute abdominal series with chest radiograph 03/24/2012

CLINICAL DATA: Preop for right mastectomy

EXAM:
CHEST  2 VIEW

[w chest lat (1 of 2)]
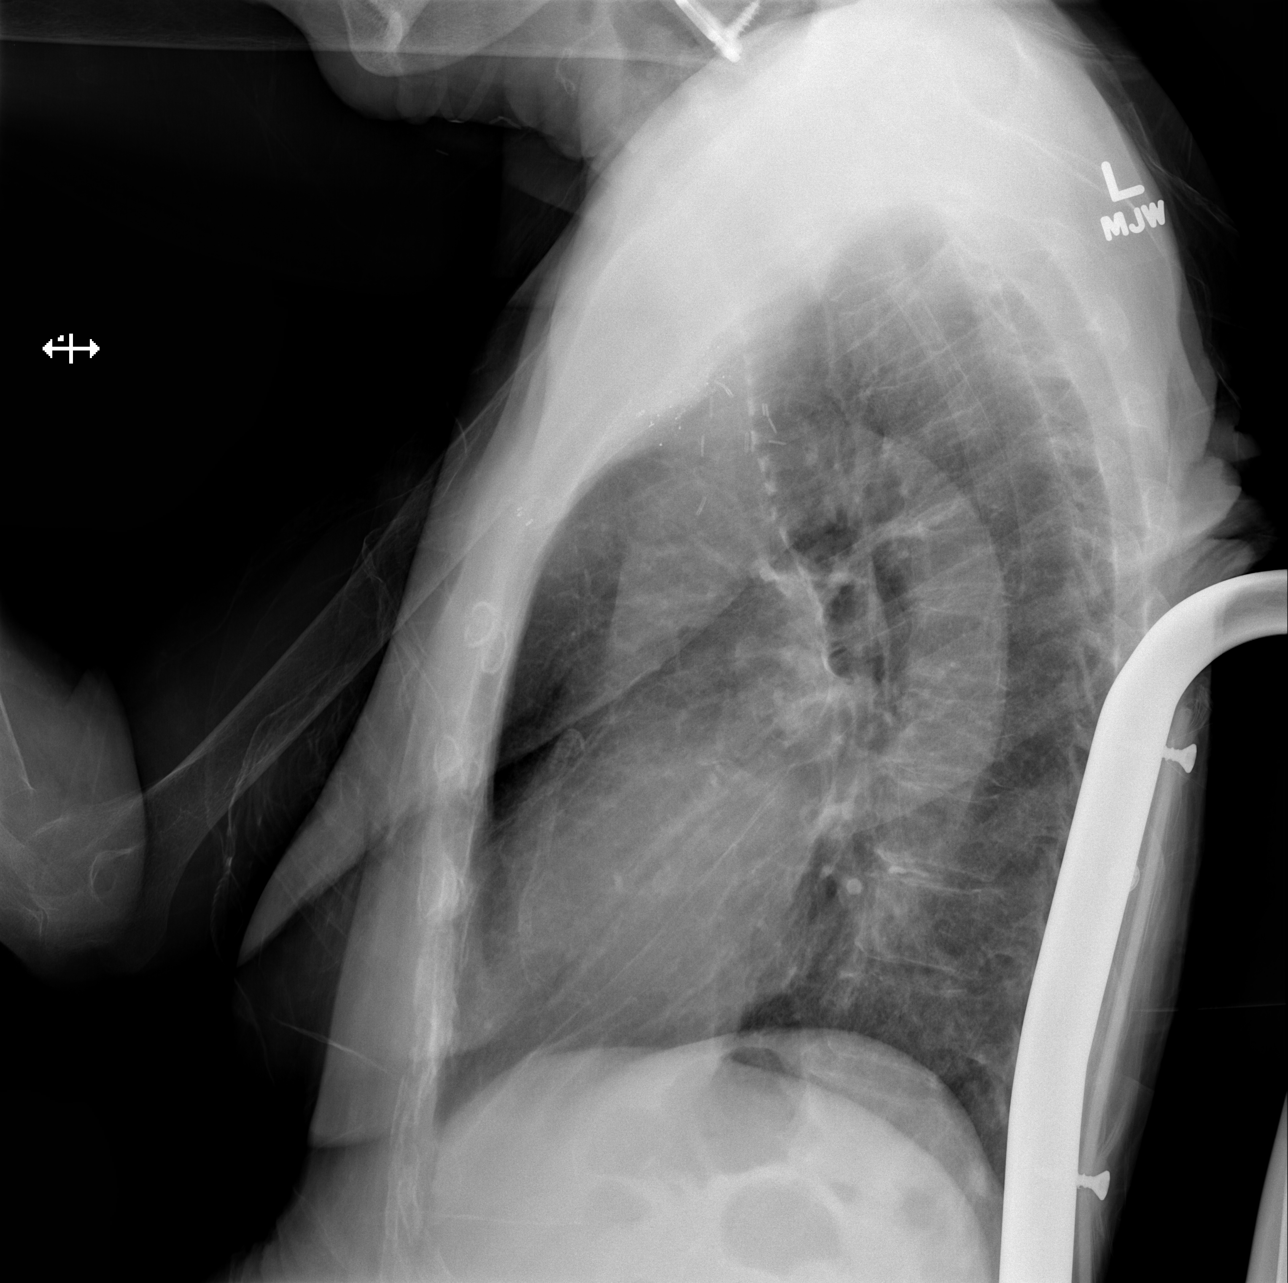

[w chest lat (2 of 2)]
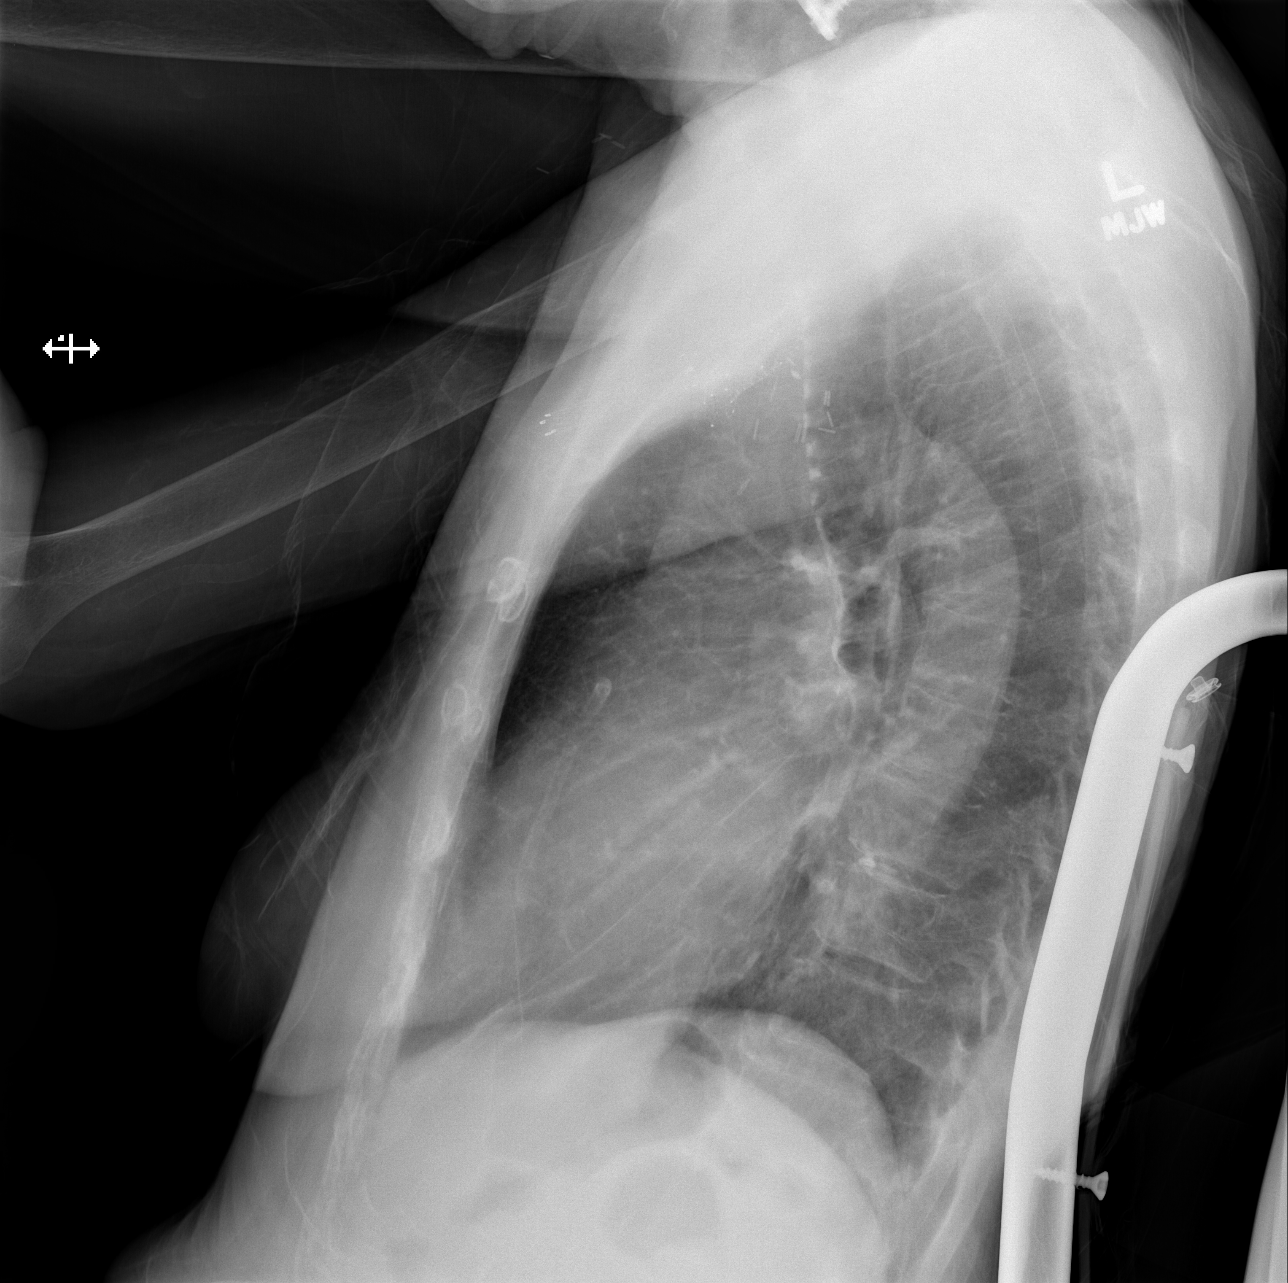

[x chest ap]
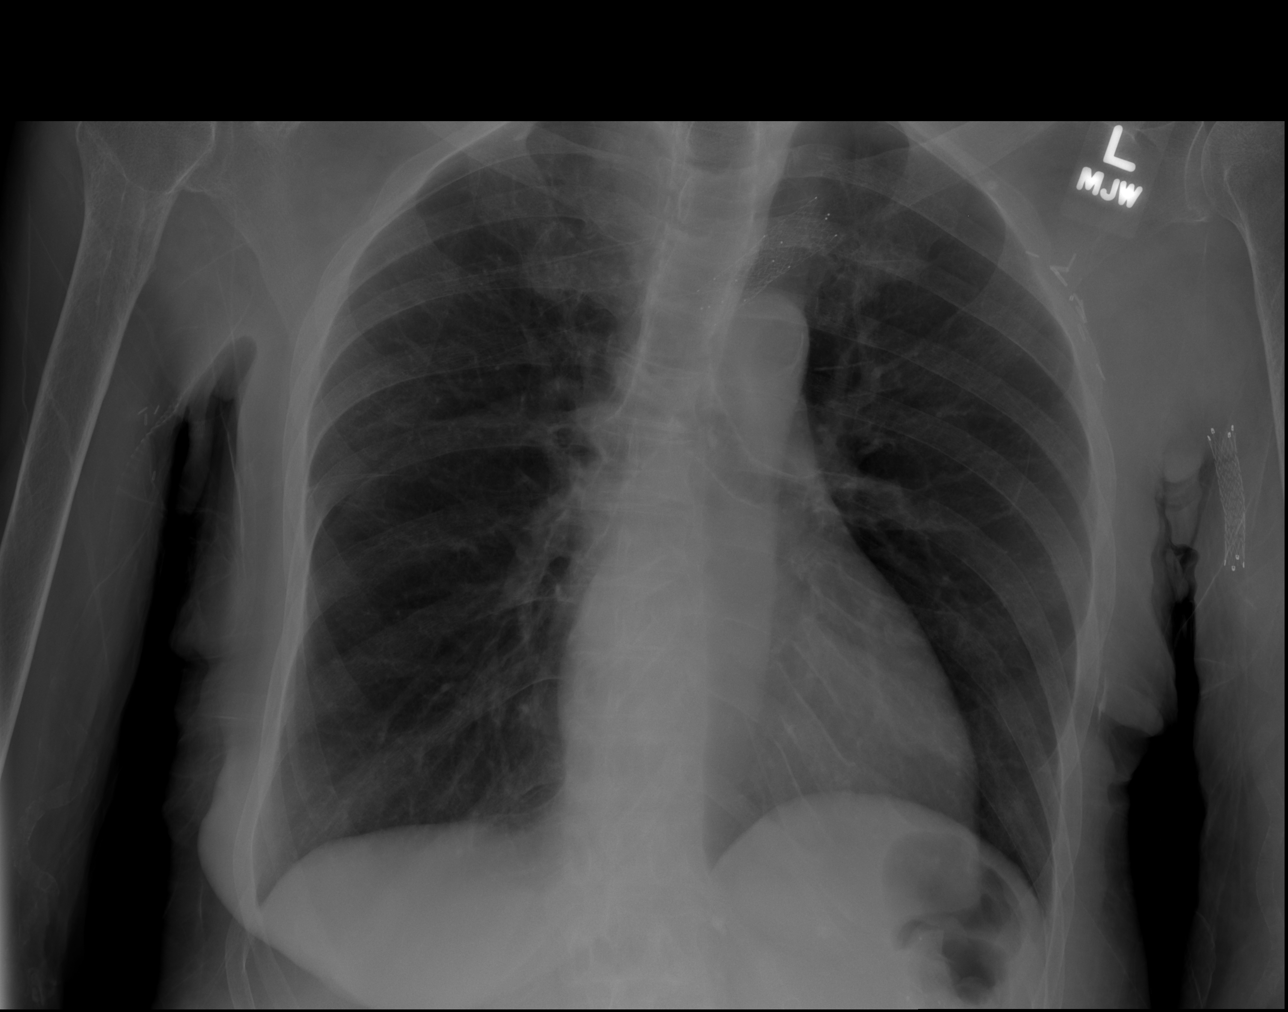

[3 of 3 positions shown; findings below may reference images not displayed]

FINDINGS: Heart size is upper normal and mediastinal and hilar contours are
stable. There is atherosclerotic calcification of thoracic aortic
arch. Vascular stent in the region of the innominate vein appears
stable. Surgical clips in the left axillary region. Left upper arm
vascular stent present. The lungs are well expanded and clear.
Negative for pleural effusion. Bones are osteopenic. No acute
osseous abnormality identified.
IMPRESSION: No acute cardiopulmonary disease.

## 2015-09-03 DIAGNOSIS — E119 Type 2 diabetes mellitus without complications: Secondary | ICD-10-CM | POA: Diagnosis not present

## 2015-09-03 DIAGNOSIS — D631 Anemia in chronic kidney disease: Secondary | ICD-10-CM | POA: Diagnosis not present

## 2015-09-03 DIAGNOSIS — N2581 Secondary hyperparathyroidism of renal origin: Secondary | ICD-10-CM | POA: Diagnosis not present

## 2015-09-03 DIAGNOSIS — D509 Iron deficiency anemia, unspecified: Secondary | ICD-10-CM | POA: Diagnosis not present

## 2015-09-03 DIAGNOSIS — N186 End stage renal disease: Secondary | ICD-10-CM | POA: Diagnosis not present

## 2015-09-05 DIAGNOSIS — D509 Iron deficiency anemia, unspecified: Secondary | ICD-10-CM | POA: Diagnosis not present

## 2015-09-05 DIAGNOSIS — N2581 Secondary hyperparathyroidism of renal origin: Secondary | ICD-10-CM | POA: Diagnosis not present

## 2015-09-05 DIAGNOSIS — E119 Type 2 diabetes mellitus without complications: Secondary | ICD-10-CM | POA: Diagnosis not present

## 2015-09-05 DIAGNOSIS — D631 Anemia in chronic kidney disease: Secondary | ICD-10-CM | POA: Diagnosis not present

## 2015-09-05 DIAGNOSIS — Z992 Dependence on renal dialysis: Secondary | ICD-10-CM | POA: Diagnosis not present

## 2015-09-05 DIAGNOSIS — E1129 Type 2 diabetes mellitus with other diabetic kidney complication: Secondary | ICD-10-CM | POA: Diagnosis not present

## 2015-09-05 DIAGNOSIS — N186 End stage renal disease: Secondary | ICD-10-CM | POA: Diagnosis not present

## 2015-09-08 DIAGNOSIS — N186 End stage renal disease: Secondary | ICD-10-CM | POA: Diagnosis not present

## 2015-09-08 DIAGNOSIS — N2581 Secondary hyperparathyroidism of renal origin: Secondary | ICD-10-CM | POA: Diagnosis not present

## 2015-09-08 DIAGNOSIS — D509 Iron deficiency anemia, unspecified: Secondary | ICD-10-CM | POA: Diagnosis not present

## 2015-09-08 DIAGNOSIS — D631 Anemia in chronic kidney disease: Secondary | ICD-10-CM | POA: Diagnosis not present

## 2015-09-08 DIAGNOSIS — E119 Type 2 diabetes mellitus without complications: Secondary | ICD-10-CM | POA: Diagnosis not present

## 2015-09-10 DIAGNOSIS — D631 Anemia in chronic kidney disease: Secondary | ICD-10-CM | POA: Diagnosis not present

## 2015-09-10 DIAGNOSIS — N2581 Secondary hyperparathyroidism of renal origin: Secondary | ICD-10-CM | POA: Diagnosis not present

## 2015-09-10 DIAGNOSIS — E119 Type 2 diabetes mellitus without complications: Secondary | ICD-10-CM | POA: Diagnosis not present

## 2015-09-10 DIAGNOSIS — N186 End stage renal disease: Secondary | ICD-10-CM | POA: Diagnosis not present

## 2015-09-10 DIAGNOSIS — D509 Iron deficiency anemia, unspecified: Secondary | ICD-10-CM | POA: Diagnosis not present

## 2015-09-12 DIAGNOSIS — N2581 Secondary hyperparathyroidism of renal origin: Secondary | ICD-10-CM | POA: Diagnosis not present

## 2015-09-12 DIAGNOSIS — E119 Type 2 diabetes mellitus without complications: Secondary | ICD-10-CM | POA: Diagnosis not present

## 2015-09-12 DIAGNOSIS — D509 Iron deficiency anemia, unspecified: Secondary | ICD-10-CM | POA: Diagnosis not present

## 2015-09-12 DIAGNOSIS — N186 End stage renal disease: Secondary | ICD-10-CM | POA: Diagnosis not present

## 2015-09-12 DIAGNOSIS — D631 Anemia in chronic kidney disease: Secondary | ICD-10-CM | POA: Diagnosis not present

## 2015-09-15 DIAGNOSIS — D631 Anemia in chronic kidney disease: Secondary | ICD-10-CM | POA: Diagnosis not present

## 2015-09-15 DIAGNOSIS — N186 End stage renal disease: Secondary | ICD-10-CM | POA: Diagnosis not present

## 2015-09-15 DIAGNOSIS — N2581 Secondary hyperparathyroidism of renal origin: Secondary | ICD-10-CM | POA: Diagnosis not present

## 2015-09-15 DIAGNOSIS — E119 Type 2 diabetes mellitus without complications: Secondary | ICD-10-CM | POA: Diagnosis not present

## 2015-09-15 DIAGNOSIS — D509 Iron deficiency anemia, unspecified: Secondary | ICD-10-CM | POA: Diagnosis not present

## 2015-09-17 DIAGNOSIS — N2581 Secondary hyperparathyroidism of renal origin: Secondary | ICD-10-CM | POA: Diagnosis not present

## 2015-09-17 DIAGNOSIS — D631 Anemia in chronic kidney disease: Secondary | ICD-10-CM | POA: Diagnosis not present

## 2015-09-17 DIAGNOSIS — D509 Iron deficiency anemia, unspecified: Secondary | ICD-10-CM | POA: Diagnosis not present

## 2015-09-17 DIAGNOSIS — N186 End stage renal disease: Secondary | ICD-10-CM | POA: Diagnosis not present

## 2015-09-17 DIAGNOSIS — E119 Type 2 diabetes mellitus without complications: Secondary | ICD-10-CM | POA: Diagnosis not present

## 2015-09-19 DIAGNOSIS — E119 Type 2 diabetes mellitus without complications: Secondary | ICD-10-CM | POA: Diagnosis not present

## 2015-09-19 DIAGNOSIS — N2581 Secondary hyperparathyroidism of renal origin: Secondary | ICD-10-CM | POA: Diagnosis not present

## 2015-09-19 DIAGNOSIS — D631 Anemia in chronic kidney disease: Secondary | ICD-10-CM | POA: Diagnosis not present

## 2015-09-19 DIAGNOSIS — N186 End stage renal disease: Secondary | ICD-10-CM | POA: Diagnosis not present

## 2015-09-19 DIAGNOSIS — D509 Iron deficiency anemia, unspecified: Secondary | ICD-10-CM | POA: Diagnosis not present

## 2015-09-22 DIAGNOSIS — N186 End stage renal disease: Secondary | ICD-10-CM | POA: Diagnosis not present

## 2015-09-22 DIAGNOSIS — D631 Anemia in chronic kidney disease: Secondary | ICD-10-CM | POA: Diagnosis not present

## 2015-09-22 DIAGNOSIS — N2581 Secondary hyperparathyroidism of renal origin: Secondary | ICD-10-CM | POA: Diagnosis not present

## 2015-09-22 DIAGNOSIS — E119 Type 2 diabetes mellitus without complications: Secondary | ICD-10-CM | POA: Diagnosis not present

## 2015-09-22 DIAGNOSIS — D509 Iron deficiency anemia, unspecified: Secondary | ICD-10-CM | POA: Diagnosis not present

## 2015-09-24 DIAGNOSIS — D631 Anemia in chronic kidney disease: Secondary | ICD-10-CM | POA: Diagnosis not present

## 2015-09-24 DIAGNOSIS — N186 End stage renal disease: Secondary | ICD-10-CM | POA: Diagnosis not present

## 2015-09-24 DIAGNOSIS — N2581 Secondary hyperparathyroidism of renal origin: Secondary | ICD-10-CM | POA: Diagnosis not present

## 2015-09-24 DIAGNOSIS — D509 Iron deficiency anemia, unspecified: Secondary | ICD-10-CM | POA: Diagnosis not present

## 2015-09-24 DIAGNOSIS — E119 Type 2 diabetes mellitus without complications: Secondary | ICD-10-CM | POA: Diagnosis not present

## 2015-09-26 DIAGNOSIS — E119 Type 2 diabetes mellitus without complications: Secondary | ICD-10-CM | POA: Diagnosis not present

## 2015-09-26 DIAGNOSIS — D631 Anemia in chronic kidney disease: Secondary | ICD-10-CM | POA: Diagnosis not present

## 2015-09-26 DIAGNOSIS — D509 Iron deficiency anemia, unspecified: Secondary | ICD-10-CM | POA: Diagnosis not present

## 2015-09-26 DIAGNOSIS — N2581 Secondary hyperparathyroidism of renal origin: Secondary | ICD-10-CM | POA: Diagnosis not present

## 2015-09-26 DIAGNOSIS — N186 End stage renal disease: Secondary | ICD-10-CM | POA: Diagnosis not present

## 2015-09-29 DIAGNOSIS — N186 End stage renal disease: Secondary | ICD-10-CM | POA: Diagnosis not present

## 2015-09-29 DIAGNOSIS — D509 Iron deficiency anemia, unspecified: Secondary | ICD-10-CM | POA: Diagnosis not present

## 2015-09-29 DIAGNOSIS — N2581 Secondary hyperparathyroidism of renal origin: Secondary | ICD-10-CM | POA: Diagnosis not present

## 2015-09-29 DIAGNOSIS — E119 Type 2 diabetes mellitus without complications: Secondary | ICD-10-CM | POA: Diagnosis not present

## 2015-09-29 DIAGNOSIS — D631 Anemia in chronic kidney disease: Secondary | ICD-10-CM | POA: Diagnosis not present

## 2015-10-01 DIAGNOSIS — E119 Type 2 diabetes mellitus without complications: Secondary | ICD-10-CM | POA: Diagnosis not present

## 2015-10-01 DIAGNOSIS — N2581 Secondary hyperparathyroidism of renal origin: Secondary | ICD-10-CM | POA: Diagnosis not present

## 2015-10-01 DIAGNOSIS — D631 Anemia in chronic kidney disease: Secondary | ICD-10-CM | POA: Diagnosis not present

## 2015-10-01 DIAGNOSIS — N186 End stage renal disease: Secondary | ICD-10-CM | POA: Diagnosis not present

## 2015-10-01 DIAGNOSIS — D509 Iron deficiency anemia, unspecified: Secondary | ICD-10-CM | POA: Diagnosis not present

## 2015-10-01 DIAGNOSIS — E1129 Type 2 diabetes mellitus with other diabetic kidney complication: Secondary | ICD-10-CM | POA: Diagnosis not present

## 2015-10-03 DIAGNOSIS — D631 Anemia in chronic kidney disease: Secondary | ICD-10-CM | POA: Diagnosis not present

## 2015-10-03 DIAGNOSIS — N2581 Secondary hyperparathyroidism of renal origin: Secondary | ICD-10-CM | POA: Diagnosis not present

## 2015-10-03 DIAGNOSIS — E119 Type 2 diabetes mellitus without complications: Secondary | ICD-10-CM | POA: Diagnosis not present

## 2015-10-03 DIAGNOSIS — N186 End stage renal disease: Secondary | ICD-10-CM | POA: Diagnosis not present

## 2015-10-03 DIAGNOSIS — D509 Iron deficiency anemia, unspecified: Secondary | ICD-10-CM | POA: Diagnosis not present

## 2015-10-06 DIAGNOSIS — D631 Anemia in chronic kidney disease: Secondary | ICD-10-CM | POA: Diagnosis not present

## 2015-10-06 DIAGNOSIS — E1129 Type 2 diabetes mellitus with other diabetic kidney complication: Secondary | ICD-10-CM | POA: Diagnosis not present

## 2015-10-06 DIAGNOSIS — N186 End stage renal disease: Secondary | ICD-10-CM | POA: Diagnosis not present

## 2015-10-06 DIAGNOSIS — E119 Type 2 diabetes mellitus without complications: Secondary | ICD-10-CM | POA: Diagnosis not present

## 2015-10-06 DIAGNOSIS — Z992 Dependence on renal dialysis: Secondary | ICD-10-CM | POA: Diagnosis not present

## 2015-10-06 DIAGNOSIS — D509 Iron deficiency anemia, unspecified: Secondary | ICD-10-CM | POA: Diagnosis not present

## 2015-10-06 DIAGNOSIS — N2581 Secondary hyperparathyroidism of renal origin: Secondary | ICD-10-CM | POA: Diagnosis not present

## 2015-10-08 DIAGNOSIS — D509 Iron deficiency anemia, unspecified: Secondary | ICD-10-CM | POA: Diagnosis not present

## 2015-10-08 DIAGNOSIS — E119 Type 2 diabetes mellitus without complications: Secondary | ICD-10-CM | POA: Diagnosis not present

## 2015-10-08 DIAGNOSIS — D631 Anemia in chronic kidney disease: Secondary | ICD-10-CM | POA: Diagnosis not present

## 2015-10-08 DIAGNOSIS — N2581 Secondary hyperparathyroidism of renal origin: Secondary | ICD-10-CM | POA: Diagnosis not present

## 2015-10-08 DIAGNOSIS — N186 End stage renal disease: Secondary | ICD-10-CM | POA: Diagnosis not present

## 2015-10-10 DIAGNOSIS — N186 End stage renal disease: Secondary | ICD-10-CM | POA: Diagnosis not present

## 2015-10-10 DIAGNOSIS — D509 Iron deficiency anemia, unspecified: Secondary | ICD-10-CM | POA: Diagnosis not present

## 2015-10-10 DIAGNOSIS — D631 Anemia in chronic kidney disease: Secondary | ICD-10-CM | POA: Diagnosis not present

## 2015-10-10 DIAGNOSIS — E119 Type 2 diabetes mellitus without complications: Secondary | ICD-10-CM | POA: Diagnosis not present

## 2015-10-10 DIAGNOSIS — N2581 Secondary hyperparathyroidism of renal origin: Secondary | ICD-10-CM | POA: Diagnosis not present

## 2015-10-13 DIAGNOSIS — N2581 Secondary hyperparathyroidism of renal origin: Secondary | ICD-10-CM | POA: Diagnosis not present

## 2015-10-13 DIAGNOSIS — N186 End stage renal disease: Secondary | ICD-10-CM | POA: Diagnosis not present

## 2015-10-13 DIAGNOSIS — E119 Type 2 diabetes mellitus without complications: Secondary | ICD-10-CM | POA: Diagnosis not present

## 2015-10-13 DIAGNOSIS — D631 Anemia in chronic kidney disease: Secondary | ICD-10-CM | POA: Diagnosis not present

## 2015-10-13 DIAGNOSIS — D509 Iron deficiency anemia, unspecified: Secondary | ICD-10-CM | POA: Diagnosis not present

## 2015-10-15 DIAGNOSIS — E119 Type 2 diabetes mellitus without complications: Secondary | ICD-10-CM | POA: Diagnosis not present

## 2015-10-15 DIAGNOSIS — D631 Anemia in chronic kidney disease: Secondary | ICD-10-CM | POA: Diagnosis not present

## 2015-10-15 DIAGNOSIS — N186 End stage renal disease: Secondary | ICD-10-CM | POA: Diagnosis not present

## 2015-10-15 DIAGNOSIS — N2581 Secondary hyperparathyroidism of renal origin: Secondary | ICD-10-CM | POA: Diagnosis not present

## 2015-10-15 DIAGNOSIS — D509 Iron deficiency anemia, unspecified: Secondary | ICD-10-CM | POA: Diagnosis not present

## 2015-10-17 DIAGNOSIS — N186 End stage renal disease: Secondary | ICD-10-CM | POA: Diagnosis not present

## 2015-10-17 DIAGNOSIS — E119 Type 2 diabetes mellitus without complications: Secondary | ICD-10-CM | POA: Diagnosis not present

## 2015-10-17 DIAGNOSIS — N2581 Secondary hyperparathyroidism of renal origin: Secondary | ICD-10-CM | POA: Diagnosis not present

## 2015-10-17 DIAGNOSIS — D631 Anemia in chronic kidney disease: Secondary | ICD-10-CM | POA: Diagnosis not present

## 2015-10-17 DIAGNOSIS — D509 Iron deficiency anemia, unspecified: Secondary | ICD-10-CM | POA: Diagnosis not present

## 2015-10-20 DIAGNOSIS — D631 Anemia in chronic kidney disease: Secondary | ICD-10-CM | POA: Diagnosis not present

## 2015-10-20 DIAGNOSIS — D509 Iron deficiency anemia, unspecified: Secondary | ICD-10-CM | POA: Diagnosis not present

## 2015-10-20 DIAGNOSIS — N2581 Secondary hyperparathyroidism of renal origin: Secondary | ICD-10-CM | POA: Diagnosis not present

## 2015-10-20 DIAGNOSIS — E119 Type 2 diabetes mellitus without complications: Secondary | ICD-10-CM | POA: Diagnosis not present

## 2015-10-20 DIAGNOSIS — N186 End stage renal disease: Secondary | ICD-10-CM | POA: Diagnosis not present

## 2015-10-21 ENCOUNTER — Ambulatory Visit (HOSPITAL_BASED_OUTPATIENT_CLINIC_OR_DEPARTMENT_OTHER): Payer: Medicare Other | Admitting: Oncology

## 2015-10-21 ENCOUNTER — Telehealth: Payer: Self-pay | Admitting: Oncology

## 2015-10-21 VITALS — BP 112/94 | HR 69 | Temp 98.8°F | Resp 16 | Ht 65.0 in | Wt 103.7 lb

## 2015-10-21 DIAGNOSIS — Z992 Dependence on renal dialysis: Secondary | ICD-10-CM

## 2015-10-21 DIAGNOSIS — N189 Chronic kidney disease, unspecified: Secondary | ICD-10-CM | POA: Diagnosis not present

## 2015-10-21 DIAGNOSIS — C50511 Malignant neoplasm of lower-outer quadrant of right female breast: Secondary | ICD-10-CM | POA: Diagnosis not present

## 2015-10-21 DIAGNOSIS — D631 Anemia in chronic kidney disease: Secondary | ICD-10-CM | POA: Diagnosis not present

## 2015-10-21 NOTE — Telephone Encounter (Signed)
Gave patient dtr avs report and appointments for February 2018.

## 2015-10-21 NOTE — Progress Notes (Signed)
Hematology and Oncology Follow Up Visit  [name] [id number] [birth year] 82 y.o. 10/21/2015 10:47 AM [name], MDGoldsborough, Heather Keto, MD   Principle Diagnosis: 82 year old woman with the diagnosis of invasive ductal carcinoma of the right breast diagnosed in April 2015. She presented with a 3.1 cm mass in the right lower breast. Tumor was found to be ER positive, PR negative HER-2 negative. She also has a remote history of left sided breast cancer.  Prior Therapy: She underwent a right total mastectomy under the care of Dr. Dalbert Batman on 08/07/2013. The pathology confirmed the presence of 3.5 cm invasive ductal carcinoma grade I/III with negative surgical margins. She was ER positive PR negative. With Ki-67 of 9%. The pathological staging was pT2 NX.  She is also status post left mastectomy done in 1988.  Current therapy: Arimidex 1 mg daily started in July 2015.  Interim History:  [name] presents today for a follow-up visit with her daughter. Since the last visit, she she continues to be about the same. She remains on dialysis dependent and continues to tolerate that without any issues. She remains independent and continues to manage without any issues. She is reporting no complications from Arimidex including arthralgias, myalgias or GI toxicities. She does report bilateral foot pain which is unrelated to her Arimidex. Her appetite remains reasonable although she lost a few pounds. She was seen in the emergency department on few occasions because of access issues.  She does not report any fevers or chills or sweats. Has not reported any weight loss or appetite changes. She does not report any chest pain shortness of breath cough or hemoptysis. Does not report any palpitation orthopnea or PND. Does not report any nausea or vomiting or abdominal pain. Does not report any frequency urgency or hesitancy. Does not report any worsening skeletal complaints. Does not report any  lymphadenopathy or petechiae. Has not reported any other neurological symptoms of headaches or blurry vision. Rest of the review of system is unremarkable.  Medications: I have reviewed the patient's current medications.  Current Outpatient Prescriptions  Medication Sig Dispense Refill  . amLODipine (NORVASC) 5 MG tablet Take 5 mg by mouth daily.  1  . anastrozole (ARIMIDEX) 1 MG tablet Take 1 tablet (1 mg total) by mouth daily. 30 tablet 6  . ibuprofen (ADVIL,MOTRIN) 200 MG tablet Take 200 mg by mouth every 6 (six) hours as needed for fever, headache or mild pain.    Marland Kitchen lanthanum (FOSRENOL) 1000 MG chewable tablet Chew 1,000 mg by mouth 2 (two) times daily with a meal.     . metoprolol succinate (TOPROL-XL) 50 MG 24 hr tablet Take 50 mg by mouth daily. Take with or immediately following a meal.    . povidone-iodine (BETADINE) 10 % ointment Apply 1 application topically as needed for wound care.    . SENSIPAR 60 MG tablet Take 1 tablet by mouth daily.     No current facility-administered medications for this visit.      Allergies:  Allergies  Allergen Reactions  . Aspirin Other (See Comments)    NO BLOOD THINNERS OF ANY KIND-bleeding events    Past Medical History, Surgical history, Social history, and Family History were reviewed and updated.  Physical Exam: Blood pressure (!) 112/94, pulse 69, temperature 98.8 F (37.1 C), temperature source Oral, resp. rate 16, height 5' 5"  (1.651 m), weight 103 lb 11.2 oz (47 kg). ECOG: 2 General appearance: Frail appearing woman without distress. She was sitting in a wheelchair.  Head: Normocephalic, without obvious abnormality no oral thrush noted. Neck: no adenopathy Lymph nodes: Cervical, supraclavicular, and axillary nodes normal. Heart:regular rate and rhythm, S1, S2 normal, no murmur, click, rub or gallop Lung:chest clear, no wheezing, rales, normal symmetric air entry Chest wall examination revealed no tenderness or masses. Abdomin:  soft, non-tender, without masses or organomegaly no rebound or guarding. EXT:no erythema, induration, or nodules   Lab Results: Lab Results  Component Value Date   WBC 5.6 07/10/2014   HGB 11.9 (L) 07/10/2014   HCT 35.8 (L) 07/10/2014   MCV 94.7 07/10/2014   PLT 182 07/10/2014     Chemistry      Component Value Date/Time   NA 138 07/10/2014 1011   NA 139 09/04/2013 1025   K 4.1 07/10/2014 1011   K 4.0 09/04/2013 1025   CL 92 (L) 07/10/2014 1011   CO2 32 07/10/2014 1011   CO2 31 (H) 09/04/2013 1025   BUN 51 (H) 07/10/2014 1011   BUN 49.9 (H) 09/04/2013 1025   CREATININE 6.43 (H) 07/10/2014 1011   CREATININE 6.2 (HH) 09/04/2013 1025      Component Value Date/Time   CALCIUM 10.4 (H) 07/10/2014 1011   CALCIUM 10.4 09/04/2013 1025   ALKPHOS 152 (H) 09/04/2013 1025   AST 43 (H) 09/04/2013 1025   ALT 21 09/04/2013 1025   BILITOT 0.54 09/04/2013 1025       Impression and Plan:   82 year old woman with the following issues:  1. An invasive ductal carcinoma of the right breast after presenting with 3.5 cm mass. She is status post right mastectomy with the pathological staging of T2 Nx completed in June 2015. Her clinical staging is T2 N0 with clinically negative axilla. Her tumor is ER positive HER-2 negative. With low grade features. She is status post left mastectomy for a breast cancer in 1988 and that time she was treated with tamoxifen.  She is currently on Arimidex and tolerated it well since July 2015. She is no objections at continue this current medication at this time. The plan is to continue a total of 5 years.  2. Renal insufficiency: Is currently hemodialysis-dependent. Recent complications related to that.  3. Anemia: Related to her renal insufficiency although it appears to have normalized.  4. Follow-up: Will be in 6 months.   [facility], MD 8/15/201710:47 AM

## 2015-10-22 DIAGNOSIS — N186 End stage renal disease: Secondary | ICD-10-CM | POA: Diagnosis not present

## 2015-10-22 DIAGNOSIS — D631 Anemia in chronic kidney disease: Secondary | ICD-10-CM | POA: Diagnosis not present

## 2015-10-22 DIAGNOSIS — E119 Type 2 diabetes mellitus without complications: Secondary | ICD-10-CM | POA: Diagnosis not present

## 2015-10-22 DIAGNOSIS — D509 Iron deficiency anemia, unspecified: Secondary | ICD-10-CM | POA: Diagnosis not present

## 2015-10-22 DIAGNOSIS — N2581 Secondary hyperparathyroidism of renal origin: Secondary | ICD-10-CM | POA: Diagnosis not present

## 2015-10-24 DIAGNOSIS — E119 Type 2 diabetes mellitus without complications: Secondary | ICD-10-CM | POA: Diagnosis not present

## 2015-10-24 DIAGNOSIS — D509 Iron deficiency anemia, unspecified: Secondary | ICD-10-CM | POA: Diagnosis not present

## 2015-10-24 DIAGNOSIS — D631 Anemia in chronic kidney disease: Secondary | ICD-10-CM | POA: Diagnosis not present

## 2015-10-24 DIAGNOSIS — N186 End stage renal disease: Secondary | ICD-10-CM | POA: Diagnosis not present

## 2015-10-24 DIAGNOSIS — N2581 Secondary hyperparathyroidism of renal origin: Secondary | ICD-10-CM | POA: Diagnosis not present

## 2015-10-27 ENCOUNTER — Encounter (HOSPITAL_COMMUNITY): Payer: Self-pay | Admitting: Emergency Medicine

## 2015-10-27 ENCOUNTER — Emergency Department (HOSPITAL_COMMUNITY)
Admission: EM | Admit: 2015-10-27 | Discharge: 2015-10-27 | Disposition: A | Discharge status: Home / Self Care | Payer: Medicare Other | Attending: Emergency Medicine | Admitting: Emergency Medicine

## 2015-10-27 DIAGNOSIS — Z87891 Personal history of nicotine dependence: Secondary | ICD-10-CM | POA: Insufficient documentation

## 2015-10-27 DIAGNOSIS — Z992 Dependence on renal dialysis: Secondary | ICD-10-CM | POA: Insufficient documentation

## 2015-10-27 DIAGNOSIS — E1122 Type 2 diabetes mellitus with diabetic chronic kidney disease: Secondary | ICD-10-CM | POA: Diagnosis not present

## 2015-10-27 DIAGNOSIS — Z853 Personal history of malignant neoplasm of breast: Secondary | ICD-10-CM | POA: Diagnosis not present

## 2015-10-27 DIAGNOSIS — I120 Hypertensive chronic kidney disease with stage 5 chronic kidney disease or end stage renal disease: Secondary | ICD-10-CM | POA: Diagnosis not present

## 2015-10-27 DIAGNOSIS — M79671 Pain in right foot: Secondary | ICD-10-CM | POA: Insufficient documentation

## 2015-10-27 DIAGNOSIS — R197 Diarrhea, unspecified: Secondary | ICD-10-CM | POA: Insufficient documentation

## 2015-10-27 DIAGNOSIS — N186 End stage renal disease: Secondary | ICD-10-CM | POA: Insufficient documentation

## 2015-10-27 DIAGNOSIS — Z79899 Other long term (current) drug therapy: Secondary | ICD-10-CM | POA: Diagnosis not present

## 2015-10-27 LAB — COMPREHENSIVE METABOLIC PANEL
ALBUMIN: 2.6 g/dL — AB (ref 3.5–5.0)
ALT: 29 U/L (ref 14–54)
AST: 45 U/L — AB (ref 15–41)
Alkaline Phosphatase: 70 U/L (ref 38–126)
Anion gap: 13 (ref 5–15)
BUN: 54 mg/dL — AB (ref 6–20)
CHLORIDE: 98 mmol/L — AB (ref 101–111)
CO2: 22 mmol/L (ref 22–32)
Calcium: 7.9 mg/dL — ABNORMAL LOW (ref 8.9–10.3)
Creatinine, Ser: 5.9 mg/dL — ABNORMAL HIGH (ref 0.44–1.00)
GFR calc Af Amer: 7 mL/min — ABNORMAL LOW (ref >60)
GFR calc non Af Amer: 6 mL/min — ABNORMAL LOW (ref >60)
GLUCOSE: 75 mg/dL (ref 65–99)
POTASSIUM: 3.7 mmol/L (ref 3.5–5.1)
Sodium: 133 mmol/L — ABNORMAL LOW (ref 135–145)
Total Bilirubin: 0.3 mg/dL (ref 0.3–1.2)
Total Protein: 6.2 g/dL — ABNORMAL LOW (ref 6.5–8.1)

## 2015-10-27 LAB — CBC WITH DIFFERENTIAL/PLATELET
BASOS ABS: 0 10*3/uL (ref 0.0–0.1)
Basophils Relative: 0 %
Eosinophils Absolute: 0.1 10*3/uL (ref 0.0–0.7)
Eosinophils Relative: 1 %
HEMATOCRIT: 38.3 % (ref 36.0–46.0)
Hemoglobin: 13 g/dL (ref 12.0–15.0)
LYMPHS PCT: 21 %
Lymphs Abs: 1.6 10*3/uL (ref 0.7–4.0)
MCH: 30.6 pg (ref 26.0–34.0)
MCHC: 33.9 g/dL (ref 30.0–36.0)
MCV: 90.1 fL (ref 78.0–100.0)
MONO ABS: 0.7 10*3/uL (ref 0.1–1.0)
MONOS PCT: 9 %
NEUTROS ABS: 5.4 10*3/uL (ref 1.7–7.7)
Neutrophils Relative %: 69 %
PLATELETS: 206 10*3/uL (ref 150–400)
RBC: 4.25 MIL/uL (ref 3.87–5.11)
RDW: 15.1 % (ref 11.5–15.5)
WBC: 7.8 10*3/uL (ref 4.0–10.5)

## 2015-10-27 LAB — LIPASE, BLOOD: Lipase: 45 U/L (ref 11–51)

## 2015-10-27 MED ORDER — TRAMADOL HCL 50 MG PO TABS
50.0000 mg | ORAL_TABLET | Freq: Once | ORAL | Status: AC
  Administered 2015-10-27: 50 mg via ORAL
  Filled 2015-10-27: qty 1

## 2015-10-27 MED ORDER — TRAMADOL HCL 50 MG PO TABS: 50.0000 mg | ORAL_TABLET | Freq: Two times a day (BID) | ORAL | 0 refills | Status: DC | PRN

## 2015-10-27 NOTE — ED Provider Notes (Signed)
[facility] DEPT Provider Note   CSN: [id number] Arrival date & time: 10/27/15  [id number]     History   Chief Complaint Chief Complaint  Patient presents with  . Diarrhea  . Leg Pain    HPI [name] is a 82 y.o. female.  The history is provided by the patient.  Patient has had 2-3 weeks of diarrhea. States it is very More Frequently Last Couple Days. It Is Watery. States she's having accidents that she'll for wound everything in her house. She has not recently been on antibiotics. No fevers or chills. States she feels weak. No nausea vomiting. States she does have increased pain in her right foot. She is a dialysis patient did not go today because she was feeling so bad. States she thinks she needs an x-ray on the foot. Slight abdominal pain. No bleeding in the stool.    Past Medical History:  Diagnosis Date  . Anemia   . Arthritis   . Bilateral breast cancer ([place])   . DM (diabetes mellitus) ([place])   . Dyslipidemia   . ESRD (end stage renal disease) on dialysis ([place])    a. [place]: MWF (07/10/2014)  . Hepatitis C   . Hyperparathyroidism   . Hypertension   . PVD (peripheral vascular disease) ([place])    a. s/p peripheral angiogram on 07/10/14 with no PCI and cont med Rx  . Vascular disease     Patient Active Problem List   Diagnosis Date Noted  . ESRD (end stage renal disease) on dialysis ([place])   . Hepatitis C   . Hypertension   . Dyslipidemia   . PVD (peripheral vascular disease) ([place]) 07/02/2014  . End stage renal disease ([place]) 08/08/2013  . Postoperative wound hematoma 08/08/2013  . Breast cancer of lower-outer quadrant of right female breast ([place]) 07/19/2013    Past Surgical History:  Procedure Laterality Date  . ABDOMINAL HYSTERECTOMY    . APPENDECTOMY    . ARTERIOVENOUS GRAFT PLACEMENT Left 03/20/09   "had right arm previously but couldn't use it anymore"  . BACK SURGERY    . BREAST BIOPSY Bilateral   . BREAST BIOPSY Right 08/08/2013   Procedure:  Evacuation Hematoma Right Chest;  Surgeon: [name], MD;  Location: [facility];  Service: General;  Laterality: Right;  . CATARACT EXTRACTION Right   . DG AV DIALYSIS GRAFT DECLOT OR Left 01/27/11, 02/15/11, 03/15/11, 10/13/11   lua  . EVACUATION BREAST HEMATOMA  2001; 2015   left; right  . MASTECTOMY Left 1970's?  Marland Kitchen MASTECTOMY COMPLETE / SIMPLE Right 08/07/2013  . MEDIAN NERVE REPAIR Left    "decompression"  . PERIPHERAL VASCULAR CATHETERIZATION N/A 07/10/2014   Procedure: Abdominal Aortogram;  Surgeon: Wellington Hampshire, MD;  Location: [facility];  Service: Cardiovascular;  Laterality: N/A;  . POSTERIOR FUSION CERVICAL SPINE     "have 6 screws"  . THROMBECTOMY AND REVISION OF ARTERIOVENTOUS (AV) GORETEX  GRAFT Left 04/18/2009  . TOE AMPUTATION Right    1,2nd toes  . TONSILLECTOMY    . TOTAL MASTECTOMY Right 08/07/2013   Procedure: RIGHT TOTAL MASTECTOMY;  Surgeon: [name], MD;  Location: [facility];  Service: General;  Laterality: Right;  . UTERINE FIBROID SURGERY      OB History    No data available       Home Medications    Prior to Admission medications   Medication Sig Start Date End Date Taking? Authorizing Provider  anastrozole (ARIMIDEX)  1 MG tablet Take 1 tablet (1 mg total) by mouth daily. 06/17/15  Yes Wyatt Portela, MD  ibuprofen (ADVIL,MOTRIN) 200 MG tablet Take 200 mg by mouth every 6 (six) hours as needed for fever, headache or mild pain.   Yes Historical Provider, MD  lanthanum (FOSRENOL) 1000 MG chewable tablet Chew 1,000 mg by mouth 2 (two) times daily with a meal.    Yes Historical Provider, MD  metoprolol succinate (TOPROL-XL) 50 MG 24 hr tablet Take 50 mg by mouth daily. Take with or immediately following a meal.   Yes Historical Provider, MD  povidone-iodine (BETADINE) 10 % ointment Apply 1 application topically as needed for wound care.   Yes Historical Provider, MD  SENSIPAR 60 MG tablet Take 1 tablet by mouth daily. 05/13/14  Yes Historical  Provider, MD  traMADol (ULTRAM) 50 MG tablet Take 1 tablet (50 mg total) by mouth every 12 (twelve) hours as needed for moderate pain. 10/27/15   Davonna Belling, MD    Family History Family History  Problem Relation Age of Onset  . Cancer Mother   . Hypertension Mother     Social History Social History  Substance Use Topics  . Smoking status: Former Smoker    Types: Cigarettes  . Smokeless tobacco: Never Used     Comment: "stopped smoking in 1969"  . Alcohol use 0.0 oz/week     Comment: "no alcohol since 1969"     Allergies   Aspirin   Review of Systems Review of Systems  Constitutional: Positive for fatigue. Negative for activity change.  HENT: Negative for sore throat.   Respiratory: Negative for chest tightness.   Cardiovascular: Negative for chest pain.  Gastrointestinal: Positive for diarrhea. Negative for abdominal pain.  Endocrine: Negative for polydipsia and polyphagia.  Musculoskeletal: Negative for back pain.  Skin: Positive for rash. Negative for wound.  Neurological: Negative for headaches.  Psychiatric/Behavioral: Negative for confusion.  All other systems reviewed and are negative.    Physical Exam Updated Vital Signs BP 129/91   Pulse 72   Temp 97.5 F (36.4 C) (Oral)   Resp 17   Ht 5\' 3"  (1.6 m)   Wt 103 lb (46.7 kg)   SpO2 93%   BMI 18.25 kg/m   Physical Exam  Constitutional: She appears well-nourished.  HENT:  Head: Normocephalic.  Eyes: EOM are normal.  Neck: Neck supple.  Cardiovascular: Normal rate.   Pulmonary/Chest: Effort normal.  Abdominal: There is no tenderness.  Musculoskeletal: She exhibits no edema.  Neurological: She is alert.  Skin:  Some peeling of skin on right foot. Previous" first and second toe.  Psychiatric: She has a normal mood and affect.     ED Treatments / Results  Labs (all labs ordered are listed, but only abnormal results are displayed) Labs Reviewed  COMPREHENSIVE METABOLIC PANEL - Abnormal;  Notable for the following:       Result Value   Sodium 133 (*)    Chloride 98 (*)    BUN 54 (*)    Creatinine, Ser 5.90 (*)    Calcium 7.9 (*)    Total Protein 6.2 (*)    Albumin 2.6 (*)    AST 45 (*)    GFR calc non Af Amer 6 (*)    GFR calc Af Amer 7 (*)    All other components within normal limits  LIPASE, BLOOD  CBC WITH DIFFERENTIAL/PLATELET    EKG  EKG Interpretation None  Radiology No results found.  Procedures Procedures (including critical care time)  Medications Ordered in ED Medications  traMADol (ULTRAM) tablet 50 mg (50 mg Oral Given 10/27/15 1126)     Initial Impression / Assessment and Plan / ED Course  I have reviewed the triage vital signs and the nursing notes.  Pertinent labs & imaging results that were available during my care of the patient were reviewed by me and considered in my medical decision making (see chart for details).  Clinical Course    Patient with diarrhea for 3 weeks. Labs reassuring. Baseline renal failure. Also foot pain. Does have pulse in the foot. Doubt acute ischemia. Given short course of pain meds. Will follow-up with her family care doctor.  Final Clinical Impressions(s) / ED Diagnoses   Final diagnoses:  Diarrhea, unspecified type  Right foot pain    New Prescriptions New Prescriptions   TRAMADOL (ULTRAM) 50 MG TABLET    Take 1 tablet (50 mg total) by mouth every 12 (twelve) hours as needed for moderate pain.     Davonna Belling, MD 10/27/15 1155

## 2015-10-27 NOTE — ED Notes (Addendum)
Pt began to explain in more detail the current pain issues.  As it turns out, she reported that her aid had to clean her up b/c she "layed in my diarrhea all night....my foot hurts b/c I can't move it throughout the night."  It appears she might be unable to care for herself at home.

## 2015-10-27 NOTE — ED Notes (Signed)
MD at bedside. 

## 2015-10-27 NOTE — ED Triage Notes (Signed)
Pt reports that she has had diarrhea for the past week, she also reports pain in her right leg for the past two weeks.  Pt was too sick to go to dialysis today (Monday, Wednesday, Friday)

## 2015-10-27 NOTE — Discharge Instructions (Signed)
Follow-up with your doctors. 

## 2015-10-28 DIAGNOSIS — E119 Type 2 diabetes mellitus without complications: Secondary | ICD-10-CM | POA: Diagnosis not present

## 2015-10-28 DIAGNOSIS — N186 End stage renal disease: Secondary | ICD-10-CM | POA: Diagnosis not present

## 2015-10-28 DIAGNOSIS — D509 Iron deficiency anemia, unspecified: Secondary | ICD-10-CM | POA: Diagnosis not present

## 2015-10-28 DIAGNOSIS — N2581 Secondary hyperparathyroidism of renal origin: Secondary | ICD-10-CM | POA: Diagnosis not present

## 2015-10-28 DIAGNOSIS — D631 Anemia in chronic kidney disease: Secondary | ICD-10-CM | POA: Diagnosis not present

## 2015-10-29 ENCOUNTER — Other Ambulatory Visit: Payer: Self-pay | Admitting: Cardiovascular Disease

## 2015-10-29 ENCOUNTER — Other Ambulatory Visit (HOSPITAL_COMMUNITY): Payer: Self-pay | Admitting: Podiatry

## 2015-10-29 ENCOUNTER — Other Ambulatory Visit (HOSPITAL_COMMUNITY): Payer: Self-pay | Admitting: General Surgery

## 2015-10-29 DIAGNOSIS — I739 Peripheral vascular disease, unspecified: Secondary | ICD-10-CM

## 2015-10-29 DIAGNOSIS — D509 Iron deficiency anemia, unspecified: Secondary | ICD-10-CM | POA: Diagnosis not present

## 2015-10-29 DIAGNOSIS — E119 Type 2 diabetes mellitus without complications: Secondary | ICD-10-CM | POA: Diagnosis not present

## 2015-10-29 DIAGNOSIS — D631 Anemia in chronic kidney disease: Secondary | ICD-10-CM | POA: Diagnosis not present

## 2015-10-29 DIAGNOSIS — N186 End stage renal disease: Secondary | ICD-10-CM | POA: Diagnosis not present

## 2015-10-29 DIAGNOSIS — N2581 Secondary hyperparathyroidism of renal origin: Secondary | ICD-10-CM | POA: Diagnosis not present

## 2015-10-31 DIAGNOSIS — N2581 Secondary hyperparathyroidism of renal origin: Secondary | ICD-10-CM | POA: Diagnosis not present

## 2015-10-31 DIAGNOSIS — D509 Iron deficiency anemia, unspecified: Secondary | ICD-10-CM | POA: Diagnosis not present

## 2015-10-31 DIAGNOSIS — N186 End stage renal disease: Secondary | ICD-10-CM | POA: Diagnosis not present

## 2015-10-31 DIAGNOSIS — E119 Type 2 diabetes mellitus without complications: Secondary | ICD-10-CM | POA: Diagnosis not present

## 2015-10-31 DIAGNOSIS — D631 Anemia in chronic kidney disease: Secondary | ICD-10-CM | POA: Diagnosis not present

## 2015-11-03 DIAGNOSIS — N2581 Secondary hyperparathyroidism of renal origin: Secondary | ICD-10-CM | POA: Diagnosis not present

## 2015-11-03 DIAGNOSIS — E119 Type 2 diabetes mellitus without complications: Secondary | ICD-10-CM | POA: Diagnosis not present

## 2015-11-03 DIAGNOSIS — D509 Iron deficiency anemia, unspecified: Secondary | ICD-10-CM | POA: Diagnosis not present

## 2015-11-03 DIAGNOSIS — N186 End stage renal disease: Secondary | ICD-10-CM | POA: Diagnosis not present

## 2015-11-03 DIAGNOSIS — D631 Anemia in chronic kidney disease: Secondary | ICD-10-CM | POA: Diagnosis not present

## 2015-11-04 ENCOUNTER — Inpatient Hospital Stay (HOSPITAL_COMMUNITY): Admission: RE | Admit: 2015-11-04 | Payer: Medicare Other | Source: Ambulatory Visit

## 2015-11-04 ENCOUNTER — Emergency Department (HOSPITAL_COMMUNITY)
Admission: EM | Admit: 2015-11-04 | Discharge: 2015-11-04 | Disposition: A | Discharge status: Home / Self Care | Payer: Medicare Other | Attending: Emergency Medicine | Admitting: Emergency Medicine

## 2015-11-04 ENCOUNTER — Emergency Department (HOSPITAL_BASED_OUTPATIENT_CLINIC_OR_DEPARTMENT_OTHER): Payer: Medicare Other

## 2015-11-04 ENCOUNTER — Encounter (HOSPITAL_COMMUNITY): Payer: Self-pay | Admitting: *Deleted

## 2015-11-04 DIAGNOSIS — Y829 Unspecified medical devices associated with adverse incidents: Secondary | ICD-10-CM | POA: Diagnosis not present

## 2015-11-04 DIAGNOSIS — T82838A Hemorrhage of vascular prosthetic devices, implants and grafts, initial encounter: Secondary | ICD-10-CM | POA: Diagnosis not present

## 2015-11-04 DIAGNOSIS — N186 End stage renal disease: Secondary | ICD-10-CM | POA: Insufficient documentation

## 2015-11-04 DIAGNOSIS — M79609 Pain in unspecified limb: Secondary | ICD-10-CM | POA: Diagnosis not present

## 2015-11-04 DIAGNOSIS — T829XXA Unspecified complication of cardiac and vascular prosthetic device, implant and graft, initial encounter: Secondary | ICD-10-CM | POA: Insufficient documentation

## 2015-11-04 DIAGNOSIS — Z79899 Other long term (current) drug therapy: Secondary | ICD-10-CM | POA: Insufficient documentation

## 2015-11-04 DIAGNOSIS — E1122 Type 2 diabetes mellitus with diabetic chronic kidney disease: Secondary | ICD-10-CM | POA: Diagnosis not present

## 2015-11-04 DIAGNOSIS — Z992 Dependence on renal dialysis: Secondary | ICD-10-CM | POA: Diagnosis not present

## 2015-11-04 DIAGNOSIS — Z87891 Personal history of nicotine dependence: Secondary | ICD-10-CM | POA: Diagnosis not present

## 2015-11-04 DIAGNOSIS — I120 Hypertensive chronic kidney disease with stage 5 chronic kidney disease or end stage renal disease: Secondary | ICD-10-CM | POA: Diagnosis not present

## 2015-11-04 DIAGNOSIS — M7989 Other specified soft tissue disorders: Secondary | ICD-10-CM | POA: Diagnosis not present

## 2015-11-04 DIAGNOSIS — Z853 Personal history of malignant neoplasm of breast: Secondary | ICD-10-CM | POA: Insufficient documentation

## 2015-11-04 LAB — CBC WITH DIFFERENTIAL/PLATELET
BASOS ABS: 0 10*3/uL (ref 0.0–0.1)
Basophils Relative: 0 %
EOS PCT: 0 %
Eosinophils Absolute: 0 10*3/uL (ref 0.0–0.7)
HEMATOCRIT: 42.4 % (ref 36.0–46.0)
HEMOGLOBIN: 13.9 g/dL (ref 12.0–15.0)
LYMPHS ABS: 0.9 10*3/uL (ref 0.7–4.0)
LYMPHS PCT: 13 %
MCH: 30.3 pg (ref 26.0–34.0)
MCHC: 32.8 g/dL (ref 30.0–36.0)
MCV: 92.6 fL (ref 78.0–100.0)
Monocytes Absolute: 0.5 10*3/uL (ref 0.1–1.0)
Monocytes Relative: 7 %
NEUTROS ABS: 5.6 10*3/uL (ref 1.7–7.7)
Neutrophils Relative %: 80 %
Platelets: 176 10*3/uL (ref 150–400)
RBC: 4.58 MIL/uL (ref 3.87–5.11)
RDW: 15 % (ref 11.5–15.5)
WBC: 7 10*3/uL (ref 4.0–10.5)

## 2015-11-04 LAB — BASIC METABOLIC PANEL
ANION GAP: 13 (ref 5–15)
BUN: 16 mg/dL (ref 6–20)
CHLORIDE: 93 mmol/L — AB (ref 101–111)
CO2: 30 mmol/L (ref 22–32)
Calcium: 9.1 mg/dL (ref 8.9–10.3)
Creatinine, Ser: 3.67 mg/dL — ABNORMAL HIGH (ref 0.44–1.00)
GFR calc Af Amer: 12 mL/min — ABNORMAL LOW (ref >60)
GFR, EST NON AFRICAN AMERICAN: 11 mL/min — AB (ref >60)
GLUCOSE: 67 mg/dL (ref 65–99)
POTASSIUM: 3.8 mmol/L (ref 3.5–5.1)
SODIUM: 136 mmol/L (ref 135–145)

## 2015-11-04 MED ORDER — TRAMADOL HCL 50 MG PO TABS
50.0000 mg | ORAL_TABLET | Freq: Once | ORAL | Status: AC
  Administered 2015-11-04: 50 mg via ORAL
  Filled 2015-11-04: qty 1

## 2015-11-04 NOTE — ED Provider Notes (Addendum)
[facility] DEPT Provider Note   CSN: [id number] Arrival date & time: 11/04/15  [id number]     History   Chief Complaint Chief Complaint  Patient presents with  . Bleeding/Bruising    HPI [name] is a 82 y.o. female presenting with left AV fistula bleeding. History is taken from patient and EMS. Patient went to dialysis yesterday and had a normal session. No bleeding yesterday. This morning her caregiver was changing her bandage and it started bleeding. Patient describes squirting blood. Prior to EMS arrival the bleeding had stopped after the caregiver had provided pressure. EMS provided a bandage but does not know seen any further bleeding. Patient states she's been feeling generalized weakness ever since the bleeding started but denies dizziness, chest pain, or shortness of breath. There is no pain or swelling in the left arm. She has never had any issues with this fistula before.  Patient later tells me about her right leg hurting for 2-3 weeks. Has chronic back pain with multiple back surgeries. Has chronic weakness in BLE and hasn't been able to stand or walk for about 1 year. Used to walk with a walker, now states she has been in a wheelchair for over 1 year. No swelling or obvious injury to RLE. Mostly hurts posteriorly.  HPI  Past Medical History:  Diagnosis Date  . Anemia   . Arthritis   . Bilateral breast cancer ([place])   . DM (diabetes mellitus) ([place])   . Dyslipidemia   . ESRD (end stage renal disease) on dialysis ([place])    a. [place]: MWF (07/10/2014)  . Hepatitis C   . Hyperparathyroidism   . Hypertension   . PVD (peripheral vascular disease) ([place])    a. s/p peripheral angiogram on 07/10/14 with no PCI and cont med Rx  . Vascular disease     Patient Active Problem List   Diagnosis Date Noted  . ESRD (end stage renal disease) on dialysis ([place])   . Hepatitis C   . Hypertension   . Dyslipidemia   . PVD (peripheral vascular disease) ([place]) 07/02/2014  . End stage  renal disease ([place]) 08/08/2013  . Postoperative wound hematoma 08/08/2013  . Breast cancer of lower-outer quadrant of right female breast ([place]) 07/19/2013    Past Surgical History:  Procedure Laterality Date  . ABDOMINAL HYSTERECTOMY    . APPENDECTOMY    . ARTERIOVENOUS GRAFT PLACEMENT Left 03/20/09   "had right arm previously but couldn't use it anymore"  . BACK SURGERY    . BREAST BIOPSY Bilateral   . BREAST BIOPSY Right 08/08/2013   Procedure: Evacuation Hematoma Right Chest;  Surgeon: [name], MD;  Location: [facility];  Service: General;  Laterality: Right;  . CATARACT EXTRACTION Right   . DG AV DIALYSIS GRAFT DECLOT OR Left 01/27/11, 02/15/11, 03/15/11, 10/13/11   lua  . EVACUATION BREAST HEMATOMA  2001; 2015   left; right  . MASTECTOMY Left 1970's?  Marland Kitchen MASTECTOMY COMPLETE / SIMPLE Right 08/07/2013  . MEDIAN NERVE REPAIR Left    "decompression"  . PERIPHERAL VASCULAR CATHETERIZATION N/A 07/10/2014   Procedure: Abdominal Aortogram;  Surgeon: Wellington Hampshire, MD;  Location: [facility];  Service: Cardiovascular;  Laterality: N/A;  . POSTERIOR FUSION CERVICAL SPINE     "have 6 screws"  . THROMBECTOMY AND REVISION OF ARTERIOVENTOUS (AV) GORETEX  GRAFT Left 04/18/2009  . TOE AMPUTATION Right    1,2nd toes  . TONSILLECTOMY    . TOTAL MASTECTOMY Right  08/07/2013   Procedure: RIGHT TOTAL MASTECTOMY;  Surgeon: [name], MD;  Location: [facility];  Service: General;  Laterality: Right;  . UTERINE FIBROID SURGERY      OB History    No data available       Home Medications    Prior to Admission medications   Medication Sig Start Date End Date Taking? Authorizing Provider  anastrozole (ARIMIDEX) 1 MG tablet Take 1 tablet (1 mg total) by mouth daily. 06/17/15   Wyatt Portela, MD  ibuprofen (ADVIL,MOTRIN) 200 MG tablet Take 200 mg by mouth every 6 (six) hours as needed for fever, headache or mild pain.    Historical Provider, MD  lanthanum (FOSRENOL) 1000 MG chewable  tablet Chew 1,000 mg by mouth 2 (two) times daily with a meal.     Historical Provider, MD  metoprolol succinate (TOPROL-XL) 50 MG 24 hr tablet Take 50 mg by mouth daily. Take with or immediately following a meal.    Historical Provider, MD  povidone-iodine (BETADINE) 10 % ointment Apply 1 application topically as needed for wound care.    Historical Provider, MD  SENSIPAR 60 MG tablet Take 1 tablet by mouth daily. 05/13/14   Historical Provider, MD  traMADol (ULTRAM) 50 MG tablet Take 1 tablet (50 mg total) by mouth every 12 (twelve) hours as needed for moderate pain. 10/27/15   Davonna Belling, MD    Family History Family History  Problem Relation Age of Onset  . Cancer Mother   . Hypertension Mother     Social History Social History  Substance Use Topics  . Smoking status: Former Smoker    Types: Cigarettes  . Smokeless tobacco: Never Used     Comment: "stopped smoking in 1969"  . Alcohol use 0.0 oz/week     Comment: "no alcohol since 1969"     Allergies   Aspirin   Review of Systems Review of Systems  Respiratory: Negative for shortness of breath.   Cardiovascular: Negative for chest pain.  Musculoskeletal: Negative for myalgias.  Neurological: Positive for weakness.  All other systems reviewed and are negative.    Physical Exam Updated Vital Signs BP 159/71   Pulse 82   Temp 98.3 F (36.8 C) (Oral)   Resp 16   Ht 5\' 3"  (1.6 m)   Wt 103 lb (46.7 kg)   SpO2 96%   BMI 18.25 kg/m   Physical Exam  Constitutional: She is oriented to person, place, and time. She appears well-developed and well-nourished. No distress.  HENT:  Head: Normocephalic and atraumatic.  Right Ear: External ear normal.  Left Ear: External ear normal.  Nose: Nose normal.  Eyes: Right eye exhibits no discharge. Left eye exhibits no discharge.  Cardiovascular: Normal rate and regular rhythm.   Murmur heard. Pulmonary/Chest: Effort normal and breath sounds normal.  Abdominal: She  exhibits no distension.  Musculoskeletal:  Left upper arm fistula with pulsation but no thrill. At the proximal aspect there appears to be a superficial abrasion but no current bleeding.  Neurological: She is alert and oriented to person, place, and time.  Skin: Skin is warm and dry. She is not diaphoretic.  Nursing note and vitals reviewed.    ED Treatments / Results  Labs (all labs ordered are listed, but only abnormal results are displayed) Labs Reviewed  BASIC METABOLIC PANEL - Abnormal; Notable for the following:       Result Value   Chloride 93 (*)    Creatinine, Ser 3.67 (*)  GFR calc non Af Amer 11 (*)    GFR calc Af Amer 12 (*)    All other components within normal limits  CBC WITH DIFFERENTIAL/PLATELET    EKG  EKG Interpretation None       Radiology VASCULAR LAB PRELIMINARY  PRELIMINARY  PRELIMINARY  PRELIMINARY  Left upper extremity dialysis graft evaluation completed.    Preliminary report:  Left dialysis graft is patent with no significant hemodynamic stenosis noted.  There is an area of tortuosity near the outflow with elevated velocities.  KANADY, CANDACE, RVT 11/04/2015, 2:09 PM  VASCULAR LAB PRELIMINARY  PRELIMINARY  PRELIMINARY  PRELIMINARY  Right lower extremity venous duplex completed.    Preliminary report:  Right :  Technically difficult study due to the size of the veins. No obvious evidence of DVT, superficial thrombosis, or Baker's cyst.  SLAUGHTER, VIRGINIA, RVS 11/04/2015, 12:24 PM  Procedures Procedures (including critical care time)  Medications Ordered in ED Medications  traMADol (ULTRAM) tablet 50 mg (50 mg Oral Given 11/04/15 1337)     Initial Impression / Assessment and Plan / ED Course  I have reviewed the triage vital signs and the nursing notes.  Pertinent labs & imaging results that were available during my care of the patient were reviewed by me and considered in my medical decision making (see chart for  details).  Clinical Course  Comment By Time  Will check AV fistula with U/S to r/o aneurysm or other acute abnormality. No current bleeding. Check basic labs including hemoglobin given feeling of overall weakness Sherwood Gambler, MD 08/29 [phone]  Patient also now complaining of right leg pain. Diffuse, calf, knee and upper leg. Present for 2-3 weeks. Will get ultrasound to r/o DVT although no obvious swelling. No PE symptoms. Sherwood Gambler, MD 08/29 [phone]    No DVT on lower extremity duplex. AV fistula is patent with no obvious pseudoaneurysm. It has still not bled since arrival to the ED. There is the friable skin but appears to have good clot and no bleeding and multiple hours of being in the ER. At this point will discharge. Family is concerned that she needs an arterial duplex. I think she probably needs this as an outpatient given progressive pain and a known history of PVD. However using the Doppler she has equal bilateral DP pulses. Both feet are warm and normal skin color. Highly doubt acute arterial emergency. Discussed return precautions with patient and caregiver.  Final Clinical Impressions(s) / ED Diagnoses   Final diagnoses:  Complication of AV dialysis fistula, initial encounter    New Prescriptions New Prescriptions   No medications on file     Sherwood Gambler, MD 11/04/15 [place], MD 11/04/15 [phone]

## 2015-11-04 NOTE — Progress Notes (Signed)
VASCULAR LAB PRELIMINARY  PRELIMINARY  PRELIMINARY  PRELIMINARY  Left upper extremity dialysis graft evaluation completed.    Preliminary report:  Left dialysis graft is patent with no significant hemodynamic stenosis noted.  There is an area of tortuosity near the outflow with elevated velocities.  Skyah Hannon, RVT 11/04/2015, 2:09 PM

## 2015-11-04 NOTE — ED Triage Notes (Signed)
Per [facility] pt received dialysis yeterday without comp,ication.  Pt woke up this morning and her fistual in her left arm began to bleed perfusivly.  Upon EMS arrival the bleeding stopped.  VS are as follows: BP: 171/76 HR: 78 Resp: 16

## 2015-11-04 NOTE — ED Notes (Signed)
Patient is still out of the department at this time

## 2015-11-04 NOTE — Progress Notes (Addendum)
VASCULAR LAB PRELIMINARY  PRELIMINARY  PRELIMINARY  PRELIMINARY  Right lower extremity venous duplex completed.    Preliminary report:  Right :  Technically difficult study due to the size of the veins. No obvious evidence of DVT, superficial thrombosis, or Baker's cyst.  Marsheila Alejo, RVS 11/04/2015, 12:24 PM

## 2015-11-04 NOTE — ED Notes (Signed)
Patient has returned from being out of the department; Regenia Skeeter, MD at bedside speaking with patient

## 2015-11-04 NOTE — ED Notes (Signed)
Patient placed back on continuous pulse oximetry and blood pressure cuff and readjusted patient on stretcher; visitor at bedside

## 2015-11-05 DIAGNOSIS — N186 End stage renal disease: Secondary | ICD-10-CM | POA: Diagnosis not present

## 2015-11-05 DIAGNOSIS — D631 Anemia in chronic kidney disease: Secondary | ICD-10-CM | POA: Diagnosis not present

## 2015-11-05 DIAGNOSIS — N2581 Secondary hyperparathyroidism of renal origin: Secondary | ICD-10-CM | POA: Diagnosis not present

## 2015-11-05 DIAGNOSIS — E119 Type 2 diabetes mellitus without complications: Secondary | ICD-10-CM | POA: Diagnosis not present

## 2015-11-05 DIAGNOSIS — D509 Iron deficiency anemia, unspecified: Secondary | ICD-10-CM | POA: Diagnosis not present

## 2015-11-06 ENCOUNTER — Other Ambulatory Visit (HOSPITAL_COMMUNITY): Payer: Self-pay | Admitting: Podiatry

## 2015-11-06 DIAGNOSIS — E1129 Type 2 diabetes mellitus with other diabetic kidney complication: Secondary | ICD-10-CM | POA: Diagnosis not present

## 2015-11-06 DIAGNOSIS — I999 Unspecified disorder of circulatory system: Secondary | ICD-10-CM

## 2015-11-06 DIAGNOSIS — Z992 Dependence on renal dialysis: Secondary | ICD-10-CM | POA: Diagnosis not present

## 2015-11-06 DIAGNOSIS — N186 End stage renal disease: Secondary | ICD-10-CM | POA: Diagnosis not present

## 2015-11-07 ENCOUNTER — Encounter: Payer: Self-pay | Admitting: Cardiovascular Disease

## 2015-11-07 ENCOUNTER — Other Ambulatory Visit (HOSPITAL_COMMUNITY): Payer: Self-pay | Admitting: Podiatry

## 2015-11-07 DIAGNOSIS — N2581 Secondary hyperparathyroidism of renal origin: Secondary | ICD-10-CM | POA: Diagnosis not present

## 2015-11-07 DIAGNOSIS — Z992 Dependence on renal dialysis: Secondary | ICD-10-CM | POA: Diagnosis not present

## 2015-11-07 DIAGNOSIS — E119 Type 2 diabetes mellitus without complications: Secondary | ICD-10-CM | POA: Diagnosis not present

## 2015-11-07 DIAGNOSIS — D509 Iron deficiency anemia, unspecified: Secondary | ICD-10-CM | POA: Diagnosis not present

## 2015-11-07 DIAGNOSIS — L97522 Non-pressure chronic ulcer of other part of left foot with fat layer exposed: Secondary | ICD-10-CM

## 2015-11-07 DIAGNOSIS — N186 End stage renal disease: Secondary | ICD-10-CM | POA: Diagnosis not present

## 2015-11-07 DIAGNOSIS — D631 Anemia in chronic kidney disease: Secondary | ICD-10-CM | POA: Diagnosis not present

## 2015-11-07 DIAGNOSIS — Z23 Encounter for immunization: Secondary | ICD-10-CM | POA: Diagnosis not present

## 2015-11-10 DIAGNOSIS — E119 Type 2 diabetes mellitus without complications: Secondary | ICD-10-CM | POA: Diagnosis not present

## 2015-11-10 DIAGNOSIS — N2581 Secondary hyperparathyroidism of renal origin: Secondary | ICD-10-CM | POA: Diagnosis not present

## 2015-11-10 DIAGNOSIS — Z992 Dependence on renal dialysis: Secondary | ICD-10-CM | POA: Diagnosis not present

## 2015-11-10 DIAGNOSIS — N186 End stage renal disease: Secondary | ICD-10-CM | POA: Diagnosis not present

## 2015-11-10 DIAGNOSIS — D509 Iron deficiency anemia, unspecified: Secondary | ICD-10-CM | POA: Diagnosis not present

## 2015-11-10 DIAGNOSIS — D631 Anemia in chronic kidney disease: Secondary | ICD-10-CM | POA: Diagnosis not present

## 2015-11-11 ENCOUNTER — Ambulatory Visit (HOSPITAL_COMMUNITY)
Admission: RE | Admit: 2015-11-11 | Discharge: 2015-11-11 | Disposition: A | Discharge status: Home / Self Care | Payer: Medicare Other | Source: Ambulatory Visit | Attending: Nephrology | Admitting: Nephrology

## 2015-11-11 DIAGNOSIS — L97522 Non-pressure chronic ulcer of other part of left foot with fat layer exposed: Secondary | ICD-10-CM | POA: Diagnosis not present

## 2015-11-11 NOTE — Progress Notes (Signed)
VASCULAR LAB PRELIMINARY  ARTERIAL  ABI completed: Bilateral ABI's are noncompressible. Unable to obtain left brachial pressure due to dialysis access graft. Right great toe is surgically absent. Left great toe PPG is absent. Cannot obtain TBI.    RIGHT    LEFT    PRESSURE WAVEFORM  PRESSURE WAVEFORM  BRACHIAL 133 Monophasic BRACHIAL Unable to obtain Monophasic  DP >254 Monophasic DP >254 Monophasic  PT >254 Monophasic PT >254 Monophasic  GREAT TOE Surgically absent NA GREAT TOE Absent Waveform NA    RIGHT LEFT  ABI 1.91 1.91    Legrand Como, RVT 11/11/2015, 12:25 PM

## 2015-11-12 DIAGNOSIS — N186 End stage renal disease: Secondary | ICD-10-CM | POA: Diagnosis not present

## 2015-11-12 DIAGNOSIS — D509 Iron deficiency anemia, unspecified: Secondary | ICD-10-CM | POA: Diagnosis not present

## 2015-11-12 DIAGNOSIS — N2581 Secondary hyperparathyroidism of renal origin: Secondary | ICD-10-CM | POA: Diagnosis not present

## 2015-11-12 DIAGNOSIS — D631 Anemia in chronic kidney disease: Secondary | ICD-10-CM | POA: Diagnosis not present

## 2015-11-12 DIAGNOSIS — E119 Type 2 diabetes mellitus without complications: Secondary | ICD-10-CM | POA: Diagnosis not present

## 2015-11-12 DIAGNOSIS — Z992 Dependence on renal dialysis: Secondary | ICD-10-CM | POA: Diagnosis not present

## 2015-11-14 DIAGNOSIS — D631 Anemia in chronic kidney disease: Secondary | ICD-10-CM | POA: Diagnosis not present

## 2015-11-14 DIAGNOSIS — T827XXA Infection and inflammatory reaction due to other cardiac and vascular devices, implants and grafts, initial encounter: Secondary | ICD-10-CM | POA: Diagnosis not present

## 2015-11-14 DIAGNOSIS — Z992 Dependence on renal dialysis: Secondary | ICD-10-CM | POA: Diagnosis not present

## 2015-11-14 DIAGNOSIS — N186 End stage renal disease: Secondary | ICD-10-CM | POA: Diagnosis not present

## 2015-11-14 DIAGNOSIS — E119 Type 2 diabetes mellitus without complications: Secondary | ICD-10-CM | POA: Diagnosis not present

## 2015-11-14 DIAGNOSIS — D509 Iron deficiency anemia, unspecified: Secondary | ICD-10-CM | POA: Diagnosis not present

## 2015-11-14 DIAGNOSIS — N2581 Secondary hyperparathyroidism of renal origin: Secondary | ICD-10-CM | POA: Diagnosis not present

## 2015-11-15 ENCOUNTER — Observation Stay (HOSPITAL_COMMUNITY): Payer: Medicare Other | Admitting: Anesthesiology

## 2015-11-15 ENCOUNTER — Encounter (HOSPITAL_COMMUNITY): Payer: Self-pay | Admitting: Emergency Medicine

## 2015-11-15 ENCOUNTER — Observation Stay (HOSPITAL_COMMUNITY): Payer: Medicare Other

## 2015-11-15 ENCOUNTER — Encounter (HOSPITAL_COMMUNITY)
Admission: EM | Disposition: A | Discharge status: Skilled Nursing Facility | Payer: Self-pay | Source: Home / Self Care | Attending: Internal Medicine

## 2015-11-15 ENCOUNTER — Inpatient Hospital Stay (HOSPITAL_COMMUNITY)
Admission: EM | Admit: 2015-11-15 | Discharge: 2015-11-24 | DRG: 239 | Disposition: A | Discharge status: Skilled Nursing Facility | Payer: Medicare Other | Attending: Internal Medicine | Admitting: Internal Medicine

## 2015-11-15 DIAGNOSIS — L8990 Pressure ulcer of unspecified site, unspecified stage: Secondary | ICD-10-CM | POA: Insufficient documentation

## 2015-11-15 DIAGNOSIS — Z853 Personal history of malignant neoplasm of breast: Secondary | ICD-10-CM

## 2015-11-15 DIAGNOSIS — Z9011 Acquired absence of right breast and nipple: Secondary | ICD-10-CM

## 2015-11-15 DIAGNOSIS — E1122 Type 2 diabetes mellitus with diabetic chronic kidney disease: Secondary | ICD-10-CM | POA: Diagnosis not present

## 2015-11-15 DIAGNOSIS — Y841 Kidney dialysis as the cause of abnormal reaction of the patient, or of later complication, without mention of misadventure at the time of the procedure: Secondary | ICD-10-CM | POA: Diagnosis present

## 2015-11-15 DIAGNOSIS — M1990 Unspecified osteoarthritis, unspecified site: Secondary | ICD-10-CM | POA: Diagnosis present

## 2015-11-15 DIAGNOSIS — Z681 Body mass index (BMI) 19 or less, adult: Secondary | ICD-10-CM | POA: Diagnosis not present

## 2015-11-15 DIAGNOSIS — I10 Essential (primary) hypertension: Secondary | ICD-10-CM | POA: Diagnosis present

## 2015-11-15 DIAGNOSIS — E43 Unspecified severe protein-calorie malnutrition: Secondary | ICD-10-CM | POA: Diagnosis not present

## 2015-11-15 DIAGNOSIS — Z452 Encounter for adjustment and management of vascular access device: Secondary | ICD-10-CM | POA: Diagnosis not present

## 2015-11-15 DIAGNOSIS — E11621 Type 2 diabetes mellitus with foot ulcer: Secondary | ICD-10-CM | POA: Diagnosis present

## 2015-11-15 DIAGNOSIS — Z992 Dependence on renal dialysis: Secondary | ICD-10-CM

## 2015-11-15 DIAGNOSIS — D649 Anemia, unspecified: Secondary | ICD-10-CM | POA: Diagnosis present

## 2015-11-15 DIAGNOSIS — N2581 Secondary hyperparathyroidism of renal origin: Secondary | ICD-10-CM | POA: Diagnosis not present

## 2015-11-15 DIAGNOSIS — N186 End stage renal disease: Secondary | ICD-10-CM | POA: Diagnosis not present

## 2015-11-15 DIAGNOSIS — M86171 Other acute osteomyelitis, right ankle and foot: Secondary | ICD-10-CM

## 2015-11-15 DIAGNOSIS — Z89611 Acquired absence of right leg above knee: Secondary | ICD-10-CM

## 2015-11-15 DIAGNOSIS — M869 Osteomyelitis, unspecified: Secondary | ICD-10-CM | POA: Diagnosis not present

## 2015-11-15 DIAGNOSIS — T82838A Hemorrhage of vascular prosthetic devices, implants and grafts, initial encounter: Secondary | ICD-10-CM | POA: Diagnosis not present

## 2015-11-15 DIAGNOSIS — Z87891 Personal history of nicotine dependence: Secondary | ICD-10-CM

## 2015-11-15 DIAGNOSIS — T82898A Other specified complication of vascular prosthetic devices, implants and grafts, initial encounter: Secondary | ICD-10-CM | POA: Diagnosis not present

## 2015-11-15 DIAGNOSIS — T82599A Other mechanical complication of unspecified cardiac and vascular devices and implants, initial encounter: Secondary | ICD-10-CM | POA: Diagnosis not present

## 2015-11-15 DIAGNOSIS — L97419 Non-pressure chronic ulcer of right heel and midfoot with unspecified severity: Secondary | ICD-10-CM | POA: Diagnosis not present

## 2015-11-15 DIAGNOSIS — Z89612 Acquired absence of left leg above knee: Secondary | ICD-10-CM

## 2015-11-15 DIAGNOSIS — N185 Chronic kidney disease, stage 5: Secondary | ICD-10-CM | POA: Diagnosis not present

## 2015-11-15 DIAGNOSIS — Z79811 Long term (current) use of aromatase inhibitors: Secondary | ICD-10-CM

## 2015-11-15 DIAGNOSIS — Z95828 Presence of other vascular implants and grafts: Secondary | ICD-10-CM

## 2015-11-15 DIAGNOSIS — L8992 Pressure ulcer of unspecified site, stage 2: Secondary | ICD-10-CM | POA: Diagnosis present

## 2015-11-15 DIAGNOSIS — D62 Acute posthemorrhagic anemia: Secondary | ICD-10-CM

## 2015-11-15 DIAGNOSIS — Z89411 Acquired absence of right great toe: Secondary | ICD-10-CM

## 2015-11-15 DIAGNOSIS — Z419 Encounter for procedure for purposes other than remedying health state, unspecified: Secondary | ICD-10-CM

## 2015-11-15 DIAGNOSIS — E11649 Type 2 diabetes mellitus with hypoglycemia without coma: Secondary | ICD-10-CM | POA: Diagnosis present

## 2015-11-15 DIAGNOSIS — E1169 Type 2 diabetes mellitus with other specified complication: Secondary | ICD-10-CM | POA: Diagnosis present

## 2015-11-15 DIAGNOSIS — R194 Change in bowel habit: Secondary | ICD-10-CM | POA: Diagnosis not present

## 2015-11-15 DIAGNOSIS — E1152 Type 2 diabetes mellitus with diabetic peripheral angiopathy with gangrene: Secondary | ICD-10-CM | POA: Diagnosis not present

## 2015-11-15 DIAGNOSIS — B1920 Unspecified viral hepatitis C without hepatic coma: Secondary | ICD-10-CM | POA: Diagnosis present

## 2015-11-15 DIAGNOSIS — E785 Hyperlipidemia, unspecified: Secondary | ICD-10-CM | POA: Diagnosis present

## 2015-11-15 DIAGNOSIS — R159 Full incontinence of feces: Secondary | ICD-10-CM

## 2015-11-15 DIAGNOSIS — L97429 Non-pressure chronic ulcer of left heel and midfoot with unspecified severity: Secondary | ICD-10-CM | POA: Diagnosis not present

## 2015-11-15 DIAGNOSIS — I120 Hypertensive chronic kidney disease with stage 5 chronic kidney disease or end stage renal disease: Secondary | ICD-10-CM | POA: Diagnosis present

## 2015-11-15 DIAGNOSIS — I739 Peripheral vascular disease, unspecified: Secondary | ICD-10-CM | POA: Diagnosis not present

## 2015-11-15 DIAGNOSIS — Z89421 Acquired absence of other right toe(s): Secondary | ICD-10-CM

## 2015-11-15 DIAGNOSIS — E1140 Type 2 diabetes mellitus with diabetic neuropathy, unspecified: Secondary | ICD-10-CM | POA: Diagnosis present

## 2015-11-15 DIAGNOSIS — Z9841 Cataract extraction status, right eye: Secondary | ICD-10-CM

## 2015-11-15 HISTORY — PX: INSERTION OF DIALYSIS CATHETER: SHX1324

## 2015-11-15 HISTORY — PX: THROMBECTOMY AND REVISION OF ARTERIOVENTOUS (AV) GORETEX  GRAFT: SHX6120

## 2015-11-15 LAB — COMPREHENSIVE METABOLIC PANEL
ALBUMIN: 2.3 g/dL — AB (ref 3.5–5.0)
ALT: 14 U/L (ref 14–54)
AST: 25 U/L (ref 15–41)
Alkaline Phosphatase: 76 U/L (ref 38–126)
Anion gap: 9 (ref 5–15)
BUN: 9 mg/dL (ref 6–20)
CO2: 30 mmol/L (ref 22–32)
Calcium: 9.8 mg/dL (ref 8.9–10.3)
Chloride: 97 mmol/L — ABNORMAL LOW (ref 101–111)
Creatinine, Ser: 2.52 mg/dL — ABNORMAL HIGH (ref 0.44–1.00)
GFR calc Af Amer: 20 mL/min — ABNORMAL LOW (ref >60)
GFR calc non Af Amer: 17 mL/min — ABNORMAL LOW (ref >60)
GLUCOSE: 115 mg/dL — AB (ref 65–99)
Potassium: 3.6 mmol/L (ref 3.5–5.1)
SODIUM: 136 mmol/L (ref 135–145)
TOTAL PROTEIN: 5.9 g/dL — AB (ref 6.5–8.1)
Total Bilirubin: 0.6 mg/dL (ref 0.3–1.2)

## 2015-11-15 LAB — CBC
HCT: 23.1 % — ABNORMAL LOW (ref 36.0–46.0)
Hemoglobin: 7.3 g/dL — ABNORMAL LOW (ref 12.0–15.0)
MCH: 29.7 pg (ref 26.0–34.0)
MCHC: 31.6 g/dL (ref 30.0–36.0)
MCV: 93.9 fL (ref 78.0–100.0)
Platelets: 153 K/uL (ref 150–400)
RBC: 2.46 MIL/uL — ABNORMAL LOW (ref 3.87–5.11)
RDW: 15 % (ref 11.5–15.5)
WBC: 11 K/uL — ABNORMAL HIGH (ref 4.0–10.5)

## 2015-11-15 LAB — GLUCOSE, CAPILLARY
GLUCOSE-CAPILLARY: 58 mg/dL — AB (ref 65–99)
GLUCOSE-CAPILLARY: 114 mg/dL — AB (ref 65–99)

## 2015-11-15 LAB — CBG MONITORING, ED: GLUCOSE-CAPILLARY: 71 mg/dL (ref 65–99)

## 2015-11-15 LAB — CBC WITH DIFFERENTIAL/PLATELET
BASOS ABS: 0 10*3/uL (ref 0.0–0.1)
BASOS PCT: 0 %
EOS ABS: 0 10*3/uL (ref 0.0–0.7)
EOS PCT: 0 %
HCT: 28.3 % — ABNORMAL LOW (ref 36.0–46.0)
Hemoglobin: 8.9 g/dL — ABNORMAL LOW (ref 12.0–15.0)
LYMPHS PCT: 12 %
Lymphs Abs: 1.3 10*3/uL (ref 0.7–4.0)
MCH: 29.3 pg (ref 26.0–34.0)
MCHC: 31.4 g/dL (ref 30.0–36.0)
MCV: 93.1 fL (ref 78.0–100.0)
Monocytes Absolute: 0.8 10*3/uL (ref 0.1–1.0)
Monocytes Relative: 7 %
Neutro Abs: 8.5 10*3/uL — ABNORMAL HIGH (ref 1.7–7.7)
Neutrophils Relative %: 81 %
PLATELETS: 202 10*3/uL (ref 150–400)
RBC: 3.04 MIL/uL — AB (ref 3.87–5.11)
RDW: 15.2 % (ref 11.5–15.5)
WBC: 10.5 10*3/uL (ref 4.0–10.5)

## 2015-11-15 LAB — SAMPLE TO BLOOD BANK

## 2015-11-15 LAB — PROTIME-INR
INR: 1.04
Prothrombin Time: 13.6 seconds (ref 11.4–15.2)

## 2015-11-15 SURGERY — THROMBECTOMY AND REVISION OF ARTERIOVENTOUS (AV) GORETEX  GRAFT
Anesthesia: General | Site: Groin | Laterality: Right

## 2015-11-15 MED ORDER — 0.9 % SODIUM CHLORIDE (POUR BTL) OPTIME
TOPICAL | Status: DC | PRN
  Administered 2015-11-15: 1000 mL

## 2015-11-15 MED ORDER — ROCURONIUM BROMIDE 10 MG/ML (PF) SYRINGE
PREFILLED_SYRINGE | INTRAVENOUS | Status: AC
  Filled 2015-11-15: qty 10

## 2015-11-15 MED ORDER — CEFAZOLIN SODIUM 1 G IJ SOLR
INTRAMUSCULAR | Status: DC | PRN
Start: 2015-11-15 — End: 2015-11-15
  Administered 2015-11-15: 2 g via INTRAMUSCULAR

## 2015-11-15 MED ORDER — ANASTROZOLE 1 MG PO TABS
1.0000 mg | ORAL_TABLET | Freq: Every day | ORAL | Status: DC
  Administered 2015-11-15 – 2015-11-24 (×8): 1 mg via ORAL
  Filled 2015-11-15 (×10): qty 1

## 2015-11-15 MED ORDER — CEFAZOLIN SODIUM 1 G IJ SOLR
INTRAMUSCULAR | Status: AC
  Filled 2015-11-15: qty 20

## 2015-11-15 MED ORDER — ROCURONIUM BROMIDE 100 MG/10ML IV SOLN
INTRAVENOUS | Status: DC | PRN
  Administered 2015-11-15: 30 mg via INTRAVENOUS

## 2015-11-15 MED ORDER — ACETAMINOPHEN 325 MG PO TABS
650.0000 mg | ORAL_TABLET | Freq: Four times a day (QID) | ORAL | Status: DC | PRN
  Administered 2015-11-17: 650 mg via ORAL

## 2015-11-15 MED ORDER — GLYCOPYRROLATE 0.2 MG/ML IV SOSY
PREFILLED_SYRINGE | INTRAVENOUS | Status: AC
  Filled 2015-11-15: qty 3

## 2015-11-15 MED ORDER — LIDOCAINE 2% (20 MG/ML) 5 ML SYRINGE
INTRAMUSCULAR | Status: AC
  Filled 2015-11-15: qty 5

## 2015-11-15 MED ORDER — SUFENTANIL CITRATE 50 MCG/ML IV SOLN
INTRAVENOUS | Status: AC
  Filled 2015-11-15: qty 1

## 2015-11-15 MED ORDER — ONDANSETRON HCL 4 MG PO TABS: 4.0000 mg | ORAL_TABLET | Freq: Four times a day (QID) | ORAL | Status: DC | PRN

## 2015-11-15 MED ORDER — PROPOFOL 10 MG/ML IV BOLUS
INTRAVENOUS | Status: DC | PRN
  Administered 2015-11-15: 50 mg via INTRAVENOUS

## 2015-11-15 MED ORDER — LANTHANUM CARBONATE 500 MG PO CHEW
1000.0000 mg | CHEWABLE_TABLET | Freq: Every day | ORAL | Status: DC
  Administered 2015-11-15 – 2015-11-19 (×5): 1000 mg via ORAL
  Filled 2015-11-15 (×5): qty 2

## 2015-11-15 MED ORDER — DEXTROSE 50 % IV SOLN
INTRAVENOUS | Status: AC
  Administered 2015-11-15: 12.5 g via INTRAVENOUS
  Filled 2015-11-15: qty 50

## 2015-11-15 MED ORDER — PHENYLEPHRINE HCL 10 MG/ML IJ SOLN
INTRAMUSCULAR | Status: AC
  Filled 2015-11-15: qty 1

## 2015-11-15 MED ORDER — TRAZODONE 25 MG HALF TABLET
25.0000 mg | ORAL_TABLET | Freq: Every evening | ORAL | Status: DC | PRN
  Filled 2015-11-15: qty 1

## 2015-11-15 MED ORDER — ONDANSETRON HCL 4 MG/2ML IJ SOLN: 4.0000 mg | Freq: Four times a day (QID) | INTRAMUSCULAR | Status: DC | PRN

## 2015-11-15 MED ORDER — HEPARIN SODIUM (PORCINE) 1000 UNIT/ML IJ SOLN
INTRAMUSCULAR | Status: DC | PRN
  Administered 2015-11-15: 6000 [IU] via INTRAVENOUS

## 2015-11-15 MED ORDER — PHENYLEPHRINE HCL 10 MG/ML IJ SOLN
INTRAMUSCULAR | Status: DC | PRN
  Administered 2015-11-15 (×2): 120 ug via INTRAVENOUS

## 2015-11-15 MED ORDER — SODIUM CHLORIDE 0.9% FLUSH
3.0000 mL | Freq: Two times a day (BID) | INTRAVENOUS | Status: DC
  Administered 2015-11-15 – 2015-11-23 (×15): 3 mL via INTRAVENOUS

## 2015-11-15 MED ORDER — METOPROLOL SUCCINATE ER 50 MG PO TB24
50.0000 mg | ORAL_TABLET | Freq: Every day | ORAL | Status: DC
  Administered 2015-11-15 – 2015-11-20 (×5): 50 mg via ORAL
  Filled 2015-11-15 (×8): qty 1

## 2015-11-15 MED ORDER — SODIUM CHLORIDE 0.9 % IV SOLN
INTRAVENOUS | Status: DC | PRN
  Administered 2015-11-15 (×2)

## 2015-11-15 MED ORDER — ACETAMINOPHEN 650 MG RE SUPP: 650.0000 mg | Freq: Four times a day (QID) | RECTAL | Status: DC | PRN

## 2015-11-15 MED ORDER — SODIUM CHLORIDE 0.9 % IV SOLN
INTRAVENOUS | Status: DC | PRN
  Administered 2015-11-15 (×2): via INTRAVENOUS

## 2015-11-15 MED ORDER — LIDOCAINE HCL (CARDIAC) 20 MG/ML IV SOLN
INTRAVENOUS | Status: DC | PRN
  Administered 2015-11-15: 40 mg via INTRAVENOUS

## 2015-11-15 MED ORDER — FENTANYL CITRATE (PF) 100 MCG/2ML IJ SOLN: 25.0000 ug | INTRAMUSCULAR | Status: DC | PRN

## 2015-11-15 MED ORDER — HEPARIN SODIUM (PORCINE) 1000 UNIT/ML IJ SOLN
INTRAMUSCULAR | Status: AC
  Filled 2015-11-15: qty 1

## 2015-11-15 MED ORDER — NEOSTIGMINE METHYLSULFATE 10 MG/10ML IV SOLN
INTRAVENOUS | Status: DC | PRN
  Administered 2015-11-15: 2 mg via INTRAVENOUS

## 2015-11-15 MED ORDER — IOPAMIDOL (ISOVUE-300) INJECTION 61%
INTRAVENOUS | Status: AC
  Filled 2015-11-15: qty 50

## 2015-11-15 MED ORDER — ALBUMIN HUMAN 5 % IV SOLN
INTRAVENOUS | Status: DC | PRN
Start: 2015-11-15 — End: 2015-11-15
  Administered 2015-11-15: 15:00:00 via INTRAVENOUS

## 2015-11-15 MED ORDER — ONDANSETRON HCL 4 MG/2ML IJ SOLN
INTRAMUSCULAR | Status: DC | PRN
  Administered 2015-11-15: 4 mg via INTRAVENOUS

## 2015-11-15 MED ORDER — PROPOFOL 10 MG/ML IV BOLUS
INTRAVENOUS | Status: AC
  Filled 2015-11-15: qty 20

## 2015-11-15 MED ORDER — DEXTROSE 50 % IV SOLN
12.5000 g | Freq: Once | INTRAVENOUS | Status: AC
  Administered 2015-11-15: 12.5 g via INTRAVENOUS

## 2015-11-15 MED ORDER — NEOSTIGMINE METHYLSULFATE 5 MG/5ML IV SOSY
PREFILLED_SYRINGE | INTRAVENOUS | Status: AC
  Filled 2015-11-15: qty 5

## 2015-11-15 MED ORDER — TRAMADOL HCL 50 MG PO TABS
50.0000 mg | ORAL_TABLET | Freq: Three times a day (TID) | ORAL | Status: AC | PRN
  Administered 2015-11-15 – 2015-11-17 (×4): 50 mg via ORAL
  Filled 2015-11-15 (×4): qty 1

## 2015-11-15 MED ORDER — FENTANYL CITRATE (PF) 100 MCG/2ML IJ SOLN
INTRAMUSCULAR | Status: AC
  Filled 2015-11-15: qty 2

## 2015-11-15 MED ORDER — FENTANYL CITRATE (PF) 100 MCG/2ML IJ SOLN
INTRAMUSCULAR | Status: DC | PRN
  Administered 2015-11-15: 50 ug via INTRAVENOUS
  Administered 2015-11-15 (×2): 25 ug via INTRAVENOUS

## 2015-11-15 MED ORDER — GLYCOPYRROLATE 0.2 MG/ML IJ SOLN
INTRAMUSCULAR | Status: DC | PRN
  Administered 2015-11-15: 0.3 mg via INTRAVENOUS

## 2015-11-15 MED ORDER — CINACALCET HCL 30 MG PO TABS
60.0000 mg | ORAL_TABLET | Freq: Every day | ORAL | Status: DC
  Administered 2015-11-15 – 2015-11-24 (×9): 60 mg via ORAL
  Filled 2015-11-15 (×10): qty 2

## 2015-11-15 MED ORDER — SUFENTANIL CITRATE 50 MCG/ML IV SOLN
INTRAVENOUS | Status: DC | PRN
  Administered 2015-11-15: 5 ug via INTRAVENOUS

## 2015-11-15 MED ORDER — ONDANSETRON HCL 4 MG/2ML IJ SOLN
INTRAMUSCULAR | Status: AC
  Filled 2015-11-15: qty 2

## 2015-11-15 MED ORDER — SODIUM CHLORIDE 0.9 % IV SOLN: Freq: Once | INTRAVENOUS | Status: DC

## 2015-11-15 MED ORDER — PHENYLEPHRINE HCL 10 MG/ML IJ SOLN
INTRAVENOUS | Status: DC | PRN
  Administered 2015-11-15: 15 ug/min via INTRAVENOUS

## 2015-11-15 SURGICAL SUPPLY — 72 items
APL SKNCLS STERI-STRIP NONHPOA (GAUZE/BANDAGES/DRESSINGS) ×4
ARMBAND PINK RESTRICT EXTREMIT (MISCELLANEOUS) ×8 IMPLANT
BAG DECANTER FOR FLEXI CONT (MISCELLANEOUS) ×4 IMPLANT
BENZOIN TINCTURE PRP APPL 2/3 (GAUZE/BANDAGES/DRESSINGS) ×6 IMPLANT
BIOPATCH RED 1 DISK 7.0 (GAUZE/BANDAGES/DRESSINGS) ×3 IMPLANT
BIOPATCH RED 1IN DISK 7.0MM (GAUZE/BANDAGES/DRESSINGS) ×1
BNDG GAUZE ELAST 4 BULKY (GAUZE/BANDAGES/DRESSINGS) ×2 IMPLANT
CANISTER SUCTION 2500CC (MISCELLANEOUS) ×4 IMPLANT
CANNULA VESSEL 3MM 2 BLNT TIP (CANNULA) ×4 IMPLANT
CATH EMB 4FR 80CM (CATHETERS) ×6 IMPLANT
CATH PALINDROME REV 55CM (CATHETERS) ×2 IMPLANT
CATH PALINDROME RT-P 15FX19CM (CATHETERS) IMPLANT
CATH PALINDROME RT-P 15FX23CM (CATHETERS) IMPLANT
CATH PALINDROME RT-P 15FX28CM (CATHETERS) IMPLANT
CATH PALINDROME RT-P 15FX55CM (CATHETERS) IMPLANT
CLIP LIGATING EXTRA MED SLVR (CLIP) ×4 IMPLANT
CLIP LIGATING EXTRA SM BLUE (MISCELLANEOUS) ×4 IMPLANT
CLOSURE STERI-STRIP 1/2X4 (GAUZE/BANDAGES/DRESSINGS) ×2
CLOSURE WOUND 1/2 X4 (GAUZE/BANDAGES/DRESSINGS) ×1
CLSR STERI-STRIP ANTIMIC 1/2X4 (GAUZE/BANDAGES/DRESSINGS) ×4 IMPLANT
COVER PROBE W GEL 5X96 (DRAPES) IMPLANT
COVER SURGICAL LIGHT HANDLE (MISCELLANEOUS) ×4 IMPLANT
DECANTER SPIKE VIAL GLASS SM (MISCELLANEOUS) ×4 IMPLANT
DRAPE C-ARM 42X72 X-RAY (DRAPES) ×4 IMPLANT
DRAPE CHEST BREAST 15X10 FENES (DRAPES) ×4 IMPLANT
ELECT REM PT RETURN 9FT ADLT (ELECTROSURGICAL) ×4
ELECTRODE REM PT RTRN 9FT ADLT (ELECTROSURGICAL) ×2 IMPLANT
GAUZE SPONGE 2X2 8PLY STRL LF (GAUZE/BANDAGES/DRESSINGS) IMPLANT
GAUZE SPONGE 4X4 12PLY STRL (GAUZE/BANDAGES/DRESSINGS) ×4 IMPLANT
GAUZE SPONGE 4X4 16PLY XRAY LF (GAUZE/BANDAGES/DRESSINGS) ×4 IMPLANT
GEL ULTRASOUND 20GR AQUASONIC (MISCELLANEOUS) IMPLANT
GLOVE BIO SURGEON STRL SZ 6.5 (GLOVE) ×1 IMPLANT
GLOVE BIO SURGEON STRL SZ7.5 (GLOVE) ×4 IMPLANT
GLOVE BIO SURGEONS STRL SZ 6.5 (GLOVE) ×1
GLOVE BIOGEL PI IND STRL 6.5 (GLOVE) IMPLANT
GLOVE BIOGEL PI IND STRL 7.5 (GLOVE) IMPLANT
GLOVE BIOGEL PI IND STRL 8 (GLOVE) IMPLANT
GLOVE BIOGEL PI INDICATOR 6.5 (GLOVE) ×8
GLOVE BIOGEL PI INDICATOR 7.5 (GLOVE) ×4
GLOVE BIOGEL PI INDICATOR 8 (GLOVE) ×2
GLOVE SS BIOGEL STRL SZ 7.5 (GLOVE) ×2 IMPLANT
GLOVE SUPERSENSE BIOGEL SZ 7.5 (GLOVE) ×4
GOWN STRL REUS W/ TWL LRG LVL3 (GOWN DISPOSABLE) ×6 IMPLANT
GOWN STRL REUS W/TWL LRG LVL3 (GOWN DISPOSABLE) ×12
GRAFT GORETEX STND 6X20 (Vascular Products) ×4 IMPLANT
GRAFT GORETEXSTD 6X20 (Vascular Products) IMPLANT
KIT BASIN OR (CUSTOM PROCEDURE TRAY) ×4 IMPLANT
KIT ROOM TURNOVER OR (KITS) ×4 IMPLANT
NDL 18GX1X1/2 (RX/OR ONLY) (NEEDLE) ×2 IMPLANT
NDL HYPO 25GX1X1/2 BEV (NEEDLE) ×2 IMPLANT
NEEDLE 18GX1X1/2 (RX/OR ONLY) (NEEDLE) ×4 IMPLANT
NEEDLE 22X1 1/2 (OR ONLY) (NEEDLE) ×4 IMPLANT
NEEDLE HYPO 25GX1X1/2 BEV (NEEDLE) ×4 IMPLANT
NS IRRIG 1000ML POUR BTL (IV SOLUTION) ×4 IMPLANT
PACK CV ACCESS (CUSTOM PROCEDURE TRAY) ×4 IMPLANT
PACK SURGICAL SETUP 50X90 (CUSTOM PROCEDURE TRAY) ×4 IMPLANT
PAD ARMBOARD 7.5X6 YLW CONV (MISCELLANEOUS) ×8 IMPLANT
SOAP 2 % CHG 4 OZ (WOUND CARE) ×4 IMPLANT
SPONGE GAUZE 2X2 STER 10/PKG (GAUZE/BANDAGES/DRESSINGS) ×2
STRIP CLOSURE SKIN 1/2X4 (GAUZE/BANDAGES/DRESSINGS) ×3 IMPLANT
SUT ETHILON 3 0 PS 1 (SUTURE) ×4 IMPLANT
SUT PROLENE 6 0 CC (SUTURE) ×4 IMPLANT
SUT VIC AB 3-0 SH 27 (SUTURE) ×8
SUT VIC AB 3-0 SH 27X BRD (SUTURE) ×2 IMPLANT
SUT VICRYL 4-0 PS2 18IN ABS (SUTURE) ×4 IMPLANT
SYR 20CC LL (SYRINGE) ×4 IMPLANT
SYR 5ML LL (SYRINGE) ×8 IMPLANT
SYR CONTROL 10ML LL (SYRINGE) ×4 IMPLANT
SYRINGE 10CC LL (SYRINGE) ×4 IMPLANT
TAPE CLOTH SURG 4X10 WHT LF (GAUZE/BANDAGES/DRESSINGS) ×4 IMPLANT
UNDERPAD 30X30 (UNDERPADS AND DIAPERS) ×4 IMPLANT
WATER STERILE IRR 1000ML POUR (IV SOLUTION) ×4 IMPLANT

## 2015-11-15 NOTE — Anesthesia Postprocedure Evaluation (Signed)
Anesthesia Post Note  Patient: [name]  Procedure(s) Performed: Procedure(s) (LRB): Thrombectomy and Revision of Left ARm AV Gortex Graft. (Left) INSERTION OF Right Femoral  DIALYSIS CATHETER. (Right)  Patient location during evaluation: PACU Anesthesia Type: General Level of consciousness: awake and alert Pain management: pain level controlled Vital Signs Assessment: post-procedure vital signs reviewed and stable Respiratory status: spontaneous breathing, nonlabored ventilation, respiratory function stable and patient connected to nasal cannula oxygen Cardiovascular status: blood pressure returned to baseline and stable Postop Assessment: no signs of nausea or vomiting Anesthetic complications: no    Last Vitals:  Vitals:   11/15/15 1726 11/15/15 1750  BP: (!) 138/49   Pulse:    Resp: 16 18  Temp:      Last Pain:  Vitals:   11/15/15 1304  TempSrc: Oral                 Gaylyn Berish,W. EDMOND

## 2015-11-15 NOTE — Discharge Instructions (Signed)
° ° °  11/15/2015 [name] [id number] 12-16-1932  Surgeon(s): Rosetta Posner, MD  Procedure(s): Placement new left upper arm AV graft (placed lateral to previous graft) Placement of tunneled dialysis catheter  x Do not stick graft for 4 weeks

## 2015-11-15 NOTE — Anesthesia Procedure Notes (Signed)
Procedure Name: Intubation Date/Time: 11/15/2015 2:01 PM Performed by: Sampson Si E Pre-anesthesia Checklist: Patient identified, Emergency Drugs available, Suction available and Patient being monitored Patient Re-evaluated:Patient Re-evaluated prior to inductionOxygen Delivery Method: Circle System Utilized Preoxygenation: Pre-oxygenation with 100% oxygen Intubation Type: IV induction Ventilation: Mask ventilation without difficulty Laryngoscope Size: Mac and 3 Grade View: Grade I Tube type: Oral Tube size: 7.0 mm Number of attempts: 1 Airway Equipment and Method: Stylet and Oral airway Placement Confirmation: ETT inserted through vocal cords under direct vision,  positive ETCO2 and breath sounds checked- equal and bilateral Secured at: 20 cm Tube secured with: Tape Dental Injury: Teeth and Oropharynx as per pre-operative assessment

## 2015-11-15 NOTE — ED Provider Notes (Signed)
[facility] DEPT Provider Note   CSN: [id number] Arrival date & time: 11/15/15  1007     History   Chief Complaint Chief Complaint  Patient presents with  . graft bleeding    HPI [name] is a 82 y.o. female.  The history is provided by the patient.  Patient presents after bleeding from her dialysis graft. Last dialyzed yesterday. Was able to do a full dialysis. States she had bleeding with pulsatile bleeding today bleeding controlled by EMS after direct pressure and a pressure dressing. States she feels a little short of breath. She had similar bleeding around a week and half ago and was seen in the ER for. She has follow-up with vascular surgery in 3 days. No pain in the arm.  Past Medical History:  Diagnosis Date  . Anemia   . Arthritis   . Bilateral breast cancer ([place])   . DM (diabetes mellitus) ([place])   . Dyslipidemia   . ESRD (end stage renal disease) on dialysis ([place])    a. [place]: MWF (07/10/2014)  . Hepatitis C   . Hyperparathyroidism   . Hypertension   . PVD (peripheral vascular disease) ([place])    a. s/p peripheral angiogram on 07/10/14 with no PCI and cont med Rx  . Vascular disease     Patient Active Problem List   Diagnosis Date Noted  . ESRD (end stage renal disease) on dialysis ([place])   . Hepatitis C   . Hypertension   . Dyslipidemia   . PVD (peripheral vascular disease) ([place]) 07/02/2014  . End stage renal disease ([place]) 08/08/2013  . Postoperative wound hematoma 08/08/2013  . Breast cancer of lower-outer quadrant of right female breast ([place]) 07/19/2013    Past Surgical History:  Procedure Laterality Date  . ABDOMINAL HYSTERECTOMY    . APPENDECTOMY    . ARTERIOVENOUS GRAFT PLACEMENT Left 03/20/09   "had right arm previously but couldn't use it anymore"  . BACK SURGERY    . BREAST BIOPSY Bilateral   . BREAST BIOPSY Right 08/08/2013   Procedure: Evacuation Hematoma Right Chest;  Surgeon: [name], MD;  Location: [facility];  Service: General;   Laterality: Right;  . CATARACT EXTRACTION Right   . DG AV DIALYSIS GRAFT DECLOT OR Left 01/27/11, 02/15/11, 03/15/11, 10/13/11   lua  . EVACUATION BREAST HEMATOMA  2001; 2015   left; right  . MASTECTOMY Left 1970's?  Marland Kitchen MASTECTOMY COMPLETE / SIMPLE Right 08/07/2013  . MEDIAN NERVE REPAIR Left    "decompression"  . PERIPHERAL VASCULAR CATHETERIZATION N/A 07/10/2014   Procedure: Abdominal Aortogram;  Surgeon: Wellington Hampshire, MD;  Location: [facility] INVASIVE CV LAB CUPID;  Service: Cardiovascular;  Laterality: N/A;  . POSTERIOR FUSION CERVICAL SPINE     "have 6 screws"  . THROMBECTOMY AND REVISION OF ARTERIOVENTOUS (AV) GORETEX  GRAFT Left 04/18/2009  . TOE AMPUTATION Right    1,2nd toes  . TONSILLECTOMY    . TOTAL MASTECTOMY Right 08/07/2013   Procedure: RIGHT TOTAL MASTECTOMY;  Surgeon: [name], MD;  Location: [facility];  Service: General;  Laterality: Right;  . UTERINE FIBROID SURGERY      OB History    No data available       Home Medications    Prior to Admission medications   Medication Sig Start Date End Date Taking? Authorizing Provider  anastrozole (ARIMIDEX) 1 MG tablet Take 1 tablet (1 mg total) by mouth daily. 06/17/15   Wyatt Portela, MD  ibuprofen (  ADVIL,MOTRIN) 200 MG tablet Take 200 mg by mouth every 6 (six) hours as needed for fever, headache or mild pain.    Historical Provider, MD  lanthanum (FOSRENOL) 1000 MG chewable tablet Chew 1,000 mg by mouth 2 (two) times daily with a meal.     Historical Provider, MD  metoprolol succinate (TOPROL-XL) 50 MG 24 hr tablet Take 50 mg by mouth daily. Take with or immediately following a meal.    Historical Provider, MD  povidone-iodine (BETADINE) 10 % ointment Apply 1 application topically as needed for wound care.    Historical Provider, MD  SENSIPAR 60 MG tablet Take 1 tablet by mouth daily. 05/13/14   Historical Provider, MD  traMADol (ULTRAM) 50 MG tablet Take 1 tablet (50 mg total) by mouth every 12 (twelve) hours as needed for  moderate pain. 10/27/15   Davonna Belling, MD    Family History Family History  Problem Relation Age of Onset  . Cancer Mother   . Hypertension Mother     Social History Social History  Substance Use Topics  . Smoking status: Former Smoker    Types: Cigarettes  . Smokeless tobacco: Never Used     Comment: "stopped smoking in 1969"  . Alcohol use 0.0 oz/week     Comment: "no alcohol since 1969"     Allergies   Aspirin   Review of Systems Review of Systems  Constitutional: Negative for chills and fever.  Respiratory: Negative for shortness of breath.   Cardiovascular: Negative for chest pain.  Gastrointestinal: Negative for abdominal pain.  Skin: Negative for wound.  Neurological: Positive for light-headedness.  Hematological: Bruises/bleeds easily.     Physical Exam Updated Vital Signs BP (!) 149/40   Pulse 65   Temp 98.7 F (37.1 C)   Resp 16   Ht 5\' 3"  (1.6 m)   Wt 103 lb (46.7 kg)   SpO2 100%   BMI 18.25 kg/m   Physical Exam  Constitutional: She appears well-developed.  HENT:  Head: Atraumatic.  Cardiovascular: Normal rate.   Pulmonary/Chest: Effort normal.  Musculoskeletal: She exhibits no edema.  Dialysis graft to left upper extremity. Pulse intact but no thrill. There is a pressure breath dressing over the distal aspect of the graft.  Skin: Skin is warm.  Psychiatric: She has a normal mood and affect.     ED Treatments / Results  Labs (all labs ordered are listed, but only abnormal results are displayed) Labs Reviewed  CBC WITH DIFFERENTIAL/PLATELET - Abnormal; Notable for the following:       Result Value   RBC 3.04 (*)    Hemoglobin 8.9 (*)    HCT 28.3 (*)    Neutro Abs 8.5 (*)    All other components within normal limits  COMPREHENSIVE METABOLIC PANEL - Abnormal; Notable for the following:    Chloride 97 (*)    Glucose, Bld 115 (*)    Creatinine, Ser 2.52 (*)    Total Protein 5.9 (*)    Albumin 2.3 (*)    GFR calc non Af Amer 17  (*)    GFR calc Af Amer 20 (*)    All other components within normal limits  PROTIME-INR  SAMPLE TO BLOOD BANK    EKG  EKG Interpretation None       Radiology No results found.  Procedures Procedures (including critical care time)  Medications Ordered in ED Medications - No data to display   Initial Impression / Assessment and Plan / ED Course  I have reviewed the triage vital signs and the nursing notes.  Pertinent labs & imaging results that were available during my care of the patient were reviewed by me and considered in my medical decision making (see chart for details).  Clinical Course  Patient with bleeding dialysis graft. Some continued bleeding. Initial dressing removed and replaced with Surgifoam and pressure dressing with some continued oozing and hemostasis appeared to be controlled. Hemoglobin is gone from 13 down to 9 in the week since her last bleed. States she feels short of breath and lightheaded. Symptomatic anemia with this. The patient's power of attorney reportedly called the nurse and said that she is worried the patient may have scraped the graft on her own to make it bleed. States she's been saying some things about not wanting to be around for her next birthday. Patient denies depression. States it began to bleed today when the home health aide was taking off the dressing. Will admit to internal medicine to monitor hemoglobins. Dr. Donnetta Hutching from vascular surgeon will come and see the patient.  Final Clinical Impressions(s) / ED Diagnoses   Final diagnoses:  Bleeding from dialysis shunt, initial encounter ([place])  Anemia, unspecified anemia type    New Prescriptions New Prescriptions   No medications on file     Davonna Belling, MD 11/15/15 1204

## 2015-11-15 NOTE — H&P (Signed)
History and Physical    [name] [id number] DOB: 07/07/1932 DOA: 11/15/2015  PCP: [name], MD  Patient coming from:   Home   Chief Complaint: bleeding fistula, shortness of breath.   HPI: [name] is a 82 y.o. female with a medical history significant for, but not  limited to, hypertension, diabetes, PAD, and end-stage renal disease on hemodialysis. Brought to ED by EMS this a.m. for evaluation of bleeding from her dialysis graft in left arm. Patient was able to complete full dialysis yesterday Seen in the ED a week and half ago for similar bleeding. At which time she also complained of right lower extremity pain. Doppler study negative for DVT. She has a follow-up appointment with vascular surgery in the next few days.  Patient complains of shortness of breath and lightheadedness, hemoglobin has dropped from 13 to 9. Per patient, bleeding started when home nursing assistant did dressing change. Patient's POA apparently voiced some concerns to nurse that patient may have initiated the bleeding herself . Per EMS blood was squirting from graft on arrival.     ED Course:  Afebrile, normal heart rate, BP 149/40, O2 saturation 100% on room air Hemoglobin 8.9, it was 14 on 11/04/2015 EDP applied pressure dressing, bleeding has ceased  EDP consulted Vascular - Dr. Donnetta Hutching to see.   Review of Systems:  Loose stools for 3.5 weeks. As per HPI, otherwise 10 point review of systems negative.    Past Medical History:  Diagnosis Date  . Anemia   . Arthritis   . Bilateral breast cancer ([place])   . DM (diabetes mellitus) ([place])   . Dyslipidemia   . ESRD (end stage renal disease) on dialysis ([place])    a. [place]: MWF (07/10/2014)  . Hepatitis C   . Hyperparathyroidism   . Hypertension   . PVD (peripheral vascular disease) ([place])    a. s/p peripheral angiogram on 07/10/14 with no PCI and cont med Rx  . Vascular disease     Past Surgical History:  Procedure Laterality Date  .  ABDOMINAL HYSTERECTOMY    . APPENDECTOMY    . ARTERIOVENOUS GRAFT PLACEMENT Left 03/20/09   "had right arm previously but couldn't use it anymore"  . BACK SURGERY    . BREAST BIOPSY Bilateral   . BREAST BIOPSY Right 08/08/2013   Procedure: Evacuation Hematoma Right Chest;  Surgeon: [name], MD;  Location: [facility];  Service: General;  Laterality: Right;  . CATARACT EXTRACTION Right   . DG AV DIALYSIS GRAFT DECLOT OR Left 01/27/11, 02/15/11, 03/15/11, 10/13/11   lua  . EVACUATION BREAST HEMATOMA  2001; 2015   left; right  . MASTECTOMY Left 1970's?  Marland Kitchen MASTECTOMY COMPLETE / SIMPLE Right 08/07/2013  . MEDIAN NERVE REPAIR Left    "decompression"  . PERIPHERAL VASCULAR CATHETERIZATION N/A 07/10/2014   Procedure: Abdominal Aortogram;  Surgeon: Wellington Hampshire, MD;  Location: [facility] INVASIVE CV LAB CUPID;  Service: Cardiovascular;  Laterality: N/A;  . POSTERIOR FUSION CERVICAL SPINE     "have 6 screws"  . THROMBECTOMY AND REVISION OF ARTERIOVENTOUS (AV) GORETEX  GRAFT Left 04/18/2009  . TOE AMPUTATION Right    1,2nd toes  . TONSILLECTOMY    . TOTAL MASTECTOMY Right 08/07/2013   Procedure: RIGHT TOTAL MASTECTOMY;  Surgeon: [name], MD;  Location: [facility];  Service: General;  Laterality: Right;  . UTERINE FIBROID SURGERY      Social History   Social History  . Marital  status: Widowed    Spouse name: N/A  . Number of children: N/A  . Years of education: N/A   Occupational History  . Not on file.   Social History Main Topics  . Smoking status: Former Smoker    Types: Cigarettes  . Smokeless tobacco: Never Used     Comment: "stopped smoking in 1969"  . Alcohol use 0.0 oz/week     Comment: "no alcohol since 1969"  . Drug use: No  . Sexual activity: No   Other Topics Concern  . Not on file   Social History Narrative  . No narrative on file   Lives alone. Bedbound but has a caregiver Allergies  Allergen Reactions  . Aspirin Other (See Comments)    NO BLOOD THINNERS OF ANY  KIND-bleeding events    Family History  Problem Relation Age of Onset  . Cancer Mother   . Hypertension Mother     Prior to Admission medications   Medication Sig Start Date End Date Taking? Authorizing Provider  anastrozole (ARIMIDEX) 1 MG tablet Take 1 tablet (1 mg total) by mouth daily. 06/17/15   Wyatt Portela, MD  ibuprofen (ADVIL,MOTRIN) 200 MG tablet Take 200 mg by mouth every 6 (six) hours as needed for fever, headache or mild pain.    Historical Provider, MD  lanthanum (FOSRENOL) 1000 MG chewable tablet Chew 1,000 mg by mouth 2 (two) times daily with a meal.     Historical Provider, MD  metoprolol succinate (TOPROL-XL) 50 MG 24 hr tablet Take 50 mg by mouth daily. Take with or immediately following a meal.    Historical Provider, MD  povidone-iodine (BETADINE) 10 % ointment Apply 1 application topically as needed for wound care.    Historical Provider, MD  SENSIPAR 60 MG tablet Take 1 tablet by mouth daily. 05/13/14   Historical Provider, MD  traMADol (ULTRAM) 50 MG tablet Take 1 tablet (50 mg total) by mouth every 12 (twelve) hours as needed for moderate pain. 10/27/15   Davonna Belling, MD    Physical Exam: Vitals:   11/15/15 1045 11/15/15 1100 11/15/15 1115 11/15/15 1130  BP: (!) 145/42 (!) 156/40 (!) 149/41 (!) 149/40  Pulse:      Resp:      Temp:      SpO2:      Weight:      Height:        Constitutional:  Pleasant emaciated black female in NAD, calm, comfortable Vitals:   11/15/15 1045 11/15/15 1100 11/15/15 1115 11/15/15 1130  BP: (!) 145/42 (!) 156/40 (!) 149/41 (!) 149/40  Pulse:      Resp:      Temp:      SpO2:      Weight:      Height:       Eyes: PER, lids and conjunctivae normal ENMT: Mucous membranes are moist. Posterior pharynx clear of any exudate or lesions..  Neck: normal, supple, no masses Respiratory: clear to auscultation bilaterally, no wheezing, no crackles. Normal respiratory effort. No accessory muscle use.  Cardiovascular: Regular rate  and rhythm, soft murmur, No extremity edema. 1+ dorsal pedis pulses.  Both hands cool to touch. Left hand mildly edematous. Normal bilateral radial pulses.  Abdomen: no tenderness, no masses palpated. No hepatomegaly. Bowel sounds positive.  Musculoskeletal: no clubbing / cyanosis. No joint deformity upper and lower extremities. Good ROM, no contractures. Normal muscle tone.  Skin: no rashes, lesions, ulcers.  Neurologic: CN 2-12 grossly intact. Sensation intact, Strength  5/5 in all 4.  Psychiatric: Normal judgment and insight. Alert and oriented x 3. Normal mood.   Labs on Admission: I have personally reviewed following labs and imaging studies  Radiological Exams on Admission: No results found.   Assessment/Plan   Principal Problem:   Bleeding from dialysis shunt [facility]) Active Problems:   Acute post-hemorrhagic anemia   ESRD (end stage renal disease) on dialysis ([facility])   Hypertension   Incontinence of feces   Bleeding from dialysis shunt. Similar episode several days ago.           -place in [facility]'s assistance. Existing LUE graft has eroded into skin. Plan is for graft replacement in OR today. A  temp dialysis cath will be placed -Briefly discussed case with Nephrology - Dr. Jonnie Finner. Patient will probably be discharged before next dialysis due but if not then Renal will be happy to see her -keep NPO for surgery.    Acute post hemorrhagic anemia.  Hgb ~9, down from 13 late August.  -Will transfuse a unit of blood -Spoke with Nephrology, Dr. Jonnie Finner who is okay with unit of blood. Patient dialyzed yesterday.   Fecal incontinence. Vague history but seems to have chronic loose stool but with some incontinence over last 3 weeks. Similar episode in 2012, found to be impacted. She may be having overflow diarrhea. Going to OR today but will order KUB  to assess for underlying constipation.   HTN, BP stable in ED.  -awaiting med rec by pharmacy . If still  taking Lopressor then will continue inpatient.   Hx of breast cancer. Right sided invasive ductal carcinoma April 2015 s/p total mastectomy. Also remote left breast cancer.  -awaiting med rec by pharmacy. If still taking Arimadex then will continue inpatient.   DVT prophylaxis:     SCDs Code Status:     Partial code - DNI  Family Communication:   none  Disposition Plan:   Discharge home in 24-48 hours             Consults called:  Vascular Surgery - Curt Jews, MD to see  Admission status:   Observation-Telemetry    Tye Savoy NP [facility] Pager [phone]  If 7PM-7AM, please contact night-coverage [email or web address] Password [email or web address]  11/15/2015, 12:06 PM

## 2015-11-15 NOTE — Transfer of Care (Signed)
Immediate Anesthesia Transfer of Care Note  Patient: [name]  Procedure(s) Performed: Procedure(s): Thrombectomy and Revision of Left ARm AV Gortex Graft. (Left) INSERTION OF Right Femoral  DIALYSIS CATHETER. (Right)  Patient Location: PACU  Anesthesia Type:General  Level of Consciousness: awake  Airway & Oxygen Therapy: Patient Spontanous Breathing and Patient connected to face mask oxygen  Post-op Assessment: Report given to RN and Post -op Vital signs reviewed and stable  Post vital signs: Reviewed and stable  Last Vitals:  Vitals:   11/15/15 1245 11/15/15 1304  BP: (!) 150/35 (!) 155/54  Pulse:  61  Resp:  20  Temp:  37.1 C    Last Pain:  Vitals:   11/15/15 1304  TempSrc: Oral         Complications: No apparent anesthesia complications

## 2015-11-15 NOTE — Op Note (Signed)
OPERATIVE REPORT  DATE OF SURGERY: 11/15/2015  PATIENT: [name], 82 y.o. female MRN: [id number]  DOB: November 03, 1932  PRE-OPERATIVE DIAGNOSIS: Bleeding from open ulceration left upper arm AV Gore-Tex graft  POST-OPERATIVE DIAGNOSIS:  Same  PROCEDURE: New left upper arm AV Gore-Tex graft followed by right femoral hemodialysis catheter  SURGEON:  Curt Jews, M.D.  PHYSICIAN ASSISTANT: Nurse  ANESTHESIA:  Gen.  EBL: 50 ml  Total I/O In: 500 [I.V.:250; IV Piggyback:250] Out: 50 [Blood:50]  BLOOD ADMINISTERED: None  DRAINS: None  SPECIMEN: None  COUNTS CORRECT:  YES  PLAN OF CARE: PACU   PATIENT DISPOSITION:  PACU - hemodynamically stable  PROCEDURE DETAILS: Patient presented to the emergency department with bleeding from her left upper arm AV graft. She had a similar bleed 1 week ago and would then discharged to home from the emergency department. On physical exam she did have ulceration into the graft was not actively bleeding on removal of the dressing but obviously had very high risk for major bleed. The upper access site also appeared to have disruption as well. She is taking up room room for plan for possible revision but probable new left upper arm graft followed by hemodialysis catheter. Explained this to the patient who understands fully.  The area of the left exam left arm prepped in usual sterile fashion. Incision was made over the old antecubital scar neck taken down to isolate the Gore-Tex graft to brachial artery anastomosis. Separate incision was made in the upper arm at the level of the axilla. This was carried down to the graft. There by physical exam appeared to be a stent in the anastomosis from the Gore-Tex graft to the native vein. The graft was occluded near the arterial anastomosis at the antecubital space and the graft was transected. The graft was also divided near the venous anastomosis. This was just below the area of a stent which was present. A  4:30 catheter was passed centrally. It would not pass the level of the subclavian vein did go to the level of her collarbone. Clot was removed and there was some backbleeding. This is easily flushed with heparinized saline. A 5 dilator passed through the old venous anastomosis and stent with no resistance. A new 6 mm Gore-Tex graft was brought onto the field. A new tunnel was created lateral to the old graft. The vein via graft was brought through the tunnel. The graft was sewn into into the old graft near the arterial anastomosis with a running 6-0 Prolene suture. The clamps removed from the artery near the brachial artery and the was excellent flow through the graft. The graft was flushed with heparinized saline and reoccluded. Next the graft was cut to appropriate length and was sewn into into the graft near the venous anastomosis just distal to where the stent was. This also was with a running 6-0 Prolene suture. Clamp removed the next center was noted. The wound irrigated with saline. Hemostasis tablet cautery. Wounds were closed with 3-0 Vicryl in the subcutaneous and subcuticular tissue and sterile dressing was applied   Attention then turned for a catheter placement. The right and left neck were visualized. This revealed no flow in her terminal jugular veins bilaterally. For this reason the right femoral vein was imaged and was patent. Using typical prep and drape technique and the right groin the right femoral vein was accessed with an 18-gauge needle using SonoSite visualization. A guidewire was passed up to the level the right atrium  and this was confirmed with fluoroscopy. A dilator peel-away sheath was passed over the guidewire and the dilator and guidewire removed. If it 5 cm image thousand catheter was positioned the level the right atrium. The catheter was brought through a subcutaneous tunnel in the 2 lm were ports were attached. Both lumens flushed and aspirated easily and were locked with 1000  unit per cc heparin. The catheter was secured the skin with 3-0 nylon stitch and the entry site was closed with a 4 subcuticular or Vicryl stitch. Sterile dressing was applied the patient was transferred to the recovery room where chest x-ray is pending   Curt Jews, M.D. 11/15/2015 3:54 PM

## 2015-11-15 NOTE — Consult Note (Signed)
                                      Patient name: [name] MRN: [id number] DOB: 04/02/1932 Sex: female    HPI: [name] is a 82 y.o. female  In the emergency department with bleeding from left upper arm AV graft. She had a similar episode on 829 and was discharged home. Has had no surgical revision of her fistula. Reports that she goes to see a vascular for interventions proximally every 3 months.  No current facility-administered medications for this encounter.      PHYSICAL EXAM: Vitals:   11/15/15 1215 11/15/15 1230 11/15/15 1245 11/15/15 1304  BP: 134/73 (!) 157/41 (!) 150/35 (!) 155/54  Pulse:    61  Resp:    20  Temp:    98.8 F (37.1 C)  TempSrc:    Oral  SpO2:    93%  Weight:      Height:        GENERAL: The patient is a well-nourished female, in no acute distress. The vital signs are documented above. Her left arm pressure dressing in place. I removed this. The upper access site I does appear to be intact although it has had multiple punctures. The lower access above the antecubital has an area with a very thin layer of of not currently bleeding but very friable and ready to rupture.  MEDICAL ISSUES: Eminent rupture of access site from her left upper arm AV graft. Discussed this with patient. Will be taken immediately to the operating room for treatment of this. With her configuration and with the aeration of the upper access site to will probably require new AV graft placement. Explained that this is required will have a catheter placed at the same setting. She understands and will proceed as soon as possible to the operating room. I did replace her pressure dressing over the area of bleeding   Rosetta Posner, MD [facility] Tel [phone] Pager [phone]

## 2015-11-15 NOTE — Anesthesia Preprocedure Evaluation (Addendum)
Anesthesia Evaluation  Patient identified by MRN, date of birth, ID band Patient awake    Reviewed: Allergy & Precautions, H&P , NPO status , Patient's Chart, lab work & pertinent test results, reviewed documented beta blocker date and time   Airway Mallampati: II  TM Distance: >3 FB Neck ROM: Full    Dental no notable dental hx. (+) Upper Dentures, Lower Dentures, Dental Advisory Given   Pulmonary neg pulmonary ROS, former smoker,    Pulmonary exam normal breath sounds clear to auscultation       Cardiovascular hypertension, Pt. on medications and Pt. on home beta blockers + Peripheral Vascular Disease   Rhythm:Regular Rate:Normal     Neuro/Psych negative neurological ROS  negative psych ROS   GI/Hepatic negative GI ROS, Neg liver ROS,   Endo/Other  diabetes  Renal/GU ESRF and DialysisRenal disease  negative genitourinary   Musculoskeletal  (+) Arthritis , Osteoarthritis,    Abdominal   Peds  Hematology negative hematology ROS (+) anemia ,   Anesthesia Other Findings   Reproductive/Obstetrics negative OB ROS                            Anesthesia Physical Anesthesia Plan  ASA: III  Anesthesia Plan: General   Post-op Pain Management:    Induction: Intravenous  Airway Management Planned: LMA  Additional Equipment:   Intra-op Plan:   Post-operative Plan: Extubation in OR  Informed Consent: I have reviewed the patients History and Physical, chart, labs and discussed the procedure including the risks, benefits and alternatives for the proposed anesthesia with the patient or authorized representative who has indicated his/her understanding and acceptance.   Dental advisory given  Plan Discussed with: CRNA  Anesthesia Plan Comments:        Anesthesia Quick Evaluation

## 2015-11-15 NOTE — OR Nursing (Signed)
Eroor corrected in site of IDC.

## 2015-11-15 NOTE — ED Notes (Signed)
[id number] [name] pt power of attorney.

## 2015-11-15 NOTE — ED Triage Notes (Addendum)
To ED via [facility] from home with c/o bleeding from graft in left upper arm. Had dialysis yesterday-- M, W, F dialysis., per EMS blood was squirting from graft-- able to be controlled by point pressure and pressure dressing applied- bleeding controlled on arrival.   Pt lives alone, but has caregiver that comes in 2 x a day, to help pt get up and get back into bed, gets meals and baths by caregiver.

## 2015-11-16 ENCOUNTER — Observation Stay (HOSPITAL_COMMUNITY): Payer: Medicare Other

## 2015-11-16 DIAGNOSIS — E1122 Type 2 diabetes mellitus with diabetic chronic kidney disease: Secondary | ICD-10-CM | POA: Diagnosis not present

## 2015-11-16 DIAGNOSIS — L8990 Pressure ulcer of unspecified site, unspecified stage: Secondary | ICD-10-CM | POA: Insufficient documentation

## 2015-11-16 DIAGNOSIS — N2581 Secondary hyperparathyroidism of renal origin: Secondary | ICD-10-CM | POA: Diagnosis not present

## 2015-11-16 DIAGNOSIS — E43 Unspecified severe protein-calorie malnutrition: Secondary | ICD-10-CM | POA: Diagnosis not present

## 2015-11-16 DIAGNOSIS — N186 End stage renal disease: Secondary | ICD-10-CM | POA: Diagnosis not present

## 2015-11-16 DIAGNOSIS — I120 Hypertensive chronic kidney disease with stage 5 chronic kidney disease or end stage renal disease: Secondary | ICD-10-CM | POA: Diagnosis not present

## 2015-11-16 DIAGNOSIS — B1920 Unspecified viral hepatitis C without hepatic coma: Secondary | ICD-10-CM | POA: Diagnosis present

## 2015-11-16 DIAGNOSIS — T82838D Hemorrhage of vascular prosthetic devices, implants and grafts, subsequent encounter: Secondary | ICD-10-CM | POA: Diagnosis not present

## 2015-11-16 DIAGNOSIS — M79672 Pain in left foot: Secondary | ICD-10-CM | POA: Diagnosis not present

## 2015-11-16 DIAGNOSIS — Z992 Dependence on renal dialysis: Secondary | ICD-10-CM | POA: Diagnosis not present

## 2015-11-16 DIAGNOSIS — E139 Other specified diabetes mellitus without complications: Secondary | ICD-10-CM | POA: Diagnosis not present

## 2015-11-16 DIAGNOSIS — L97419 Non-pressure chronic ulcer of right heel and midfoot with unspecified severity: Secondary | ICD-10-CM | POA: Diagnosis not present

## 2015-11-16 DIAGNOSIS — I10 Essential (primary) hypertension: Secondary | ICD-10-CM

## 2015-11-16 DIAGNOSIS — I70262 Atherosclerosis of native arteries of extremities with gangrene, left leg: Secondary | ICD-10-CM | POA: Diagnosis not present

## 2015-11-16 DIAGNOSIS — E785 Hyperlipidemia, unspecified: Secondary | ICD-10-CM | POA: Diagnosis present

## 2015-11-16 DIAGNOSIS — Z8739 Personal history of other diseases of the musculoskeletal system and connective tissue: Secondary | ICD-10-CM | POA: Diagnosis not present

## 2015-11-16 DIAGNOSIS — Z9841 Cataract extraction status, right eye: Secondary | ICD-10-CM | POA: Diagnosis not present

## 2015-11-16 DIAGNOSIS — I70261 Atherosclerosis of native arteries of extremities with gangrene, right leg: Secondary | ICD-10-CM | POA: Diagnosis not present

## 2015-11-16 DIAGNOSIS — M1990 Unspecified osteoarthritis, unspecified site: Secondary | ICD-10-CM | POA: Diagnosis present

## 2015-11-16 DIAGNOSIS — Z89611 Acquired absence of right leg above knee: Secondary | ICD-10-CM | POA: Diagnosis not present

## 2015-11-16 DIAGNOSIS — D631 Anemia in chronic kidney disease: Secondary | ICD-10-CM | POA: Diagnosis not present

## 2015-11-16 DIAGNOSIS — Z681 Body mass index (BMI) 19 or less, adult: Secondary | ICD-10-CM | POA: Diagnosis not present

## 2015-11-16 DIAGNOSIS — M86672 Other chronic osteomyelitis, left ankle and foot: Secondary | ICD-10-CM | POA: Diagnosis not present

## 2015-11-16 DIAGNOSIS — R278 Other lack of coordination: Secondary | ICD-10-CM | POA: Diagnosis not present

## 2015-11-16 DIAGNOSIS — Z89619 Acquired absence of unspecified leg above knee: Secondary | ICD-10-CM | POA: Diagnosis not present

## 2015-11-16 DIAGNOSIS — I96 Gangrene, not elsewhere classified: Secondary | ICD-10-CM | POA: Diagnosis not present

## 2015-11-16 DIAGNOSIS — E1169 Type 2 diabetes mellitus with other specified complication: Secondary | ICD-10-CM | POA: Diagnosis present

## 2015-11-16 DIAGNOSIS — Z853 Personal history of malignant neoplasm of breast: Secondary | ICD-10-CM | POA: Diagnosis not present

## 2015-11-16 DIAGNOSIS — R58 Hemorrhage, not elsewhere classified: Secondary | ICD-10-CM | POA: Diagnosis not present

## 2015-11-16 DIAGNOSIS — Z89411 Acquired absence of right great toe: Secondary | ICD-10-CM | POA: Diagnosis not present

## 2015-11-16 DIAGNOSIS — R159 Full incontinence of feces: Secondary | ICD-10-CM | POA: Diagnosis present

## 2015-11-16 DIAGNOSIS — D649 Anemia, unspecified: Secondary | ICD-10-CM | POA: Diagnosis present

## 2015-11-16 DIAGNOSIS — R41841 Cognitive communication deficit: Secondary | ICD-10-CM | POA: Diagnosis not present

## 2015-11-16 DIAGNOSIS — D62 Acute posthemorrhagic anemia: Secondary | ICD-10-CM | POA: Diagnosis not present

## 2015-11-16 DIAGNOSIS — E1152 Type 2 diabetes mellitus with diabetic peripheral angiopathy with gangrene: Secondary | ICD-10-CM | POA: Diagnosis not present

## 2015-11-16 DIAGNOSIS — E1129 Type 2 diabetes mellitus with other diabetic kidney complication: Secondary | ICD-10-CM | POA: Diagnosis not present

## 2015-11-16 DIAGNOSIS — E11649 Type 2 diabetes mellitus with hypoglycemia without coma: Secondary | ICD-10-CM | POA: Diagnosis present

## 2015-11-16 DIAGNOSIS — E213 Hyperparathyroidism, unspecified: Secondary | ICD-10-CM | POA: Diagnosis not present

## 2015-11-16 DIAGNOSIS — M86171 Other acute osteomyelitis, right ankle and foot: Secondary | ICD-10-CM | POA: Diagnosis not present

## 2015-11-16 DIAGNOSIS — Z89612 Acquired absence of left leg above knee: Secondary | ICD-10-CM | POA: Diagnosis not present

## 2015-11-16 DIAGNOSIS — M6281 Muscle weakness (generalized): Secondary | ICD-10-CM | POA: Diagnosis not present

## 2015-11-16 DIAGNOSIS — Y841 Kidney dialysis as the cause of abnormal reaction of the patient, or of later complication, without mention of misadventure at the time of the procedure: Secondary | ICD-10-CM | POA: Diagnosis present

## 2015-11-16 DIAGNOSIS — I70263 Atherosclerosis of native arteries of extremities with gangrene, bilateral legs: Secondary | ICD-10-CM | POA: Diagnosis not present

## 2015-11-16 DIAGNOSIS — Z89421 Acquired absence of other right toe(s): Secondary | ICD-10-CM | POA: Diagnosis not present

## 2015-11-16 DIAGNOSIS — T82838A Hemorrhage of vascular prosthetic devices, implants and grafts, initial encounter: Secondary | ICD-10-CM | POA: Diagnosis not present

## 2015-11-16 DIAGNOSIS — L97429 Non-pressure chronic ulcer of left heel and midfoot with unspecified severity: Secondary | ICD-10-CM | POA: Diagnosis not present

## 2015-11-16 DIAGNOSIS — M869 Osteomyelitis, unspecified: Secondary | ICD-10-CM | POA: Diagnosis not present

## 2015-11-16 DIAGNOSIS — Z87891 Personal history of nicotine dependence: Secondary | ICD-10-CM | POA: Diagnosis not present

## 2015-11-16 DIAGNOSIS — I739 Peripheral vascular disease, unspecified: Secondary | ICD-10-CM | POA: Diagnosis not present

## 2015-11-16 DIAGNOSIS — C50919 Malignant neoplasm of unspecified site of unspecified female breast: Secondary | ICD-10-CM | POA: Diagnosis not present

## 2015-11-16 LAB — PREPARE RBC (CROSSMATCH)

## 2015-11-16 LAB — CBC
HCT: 20.1 % — ABNORMAL LOW (ref 36.0–46.0)
HEMATOCRIT: 25.9 % — AB (ref 36.0–46.0)
Hemoglobin: 6.5 g/dL — CL (ref 12.0–15.0)
Hemoglobin: 8.5 g/dL — ABNORMAL LOW (ref 12.0–15.0)
MCH: 30 pg (ref 26.0–34.0)
MCH: 30.1 pg (ref 26.0–34.0)
MCHC: 32.3 g/dL (ref 30.0–36.0)
MCHC: 32.8 g/dL (ref 30.0–36.0)
MCV: 92.6 fL (ref 78.0–100.0)
MCV: 91.8 fL (ref 78.0–100.0)
Platelets: 163 10*3/uL (ref 150–400)
Platelets: 142 10*3/uL — ABNORMAL LOW (ref 150–400)
RBC: 2.17 MIL/uL — ABNORMAL LOW (ref 3.87–5.11)
RBC: 2.82 MIL/uL — ABNORMAL LOW (ref 3.87–5.11)
RDW: 15 % (ref 11.5–15.5)
RDW: 14.6 % (ref 11.5–15.5)
WBC: 13.1 10*3/uL — ABNORMAL HIGH (ref 4.0–10.5)
WBC: 14.5 10*3/uL — AB (ref 4.0–10.5)

## 2015-11-16 LAB — BASIC METABOLIC PANEL
Anion gap: 6 (ref 5–15)
BUN: 14 mg/dL (ref 6–20)
CO2: 28 mmol/L (ref 22–32)
Calcium: 9.5 mg/dL (ref 8.9–10.3)
Chloride: 100 mmol/L — ABNORMAL LOW (ref 101–111)
Creatinine, Ser: 3.06 mg/dL — ABNORMAL HIGH (ref 0.44–1.00)
GFR calc Af Amer: 16 mL/min — ABNORMAL LOW (ref >60)
GFR calc non Af Amer: 13 mL/min — ABNORMAL LOW (ref >60)
Glucose, Bld: 143 mg/dL — ABNORMAL HIGH (ref 65–99)
Potassium: 3.9 mmol/L (ref 3.5–5.1)
Sodium: 134 mmol/L — ABNORMAL LOW (ref 135–145)

## 2015-11-16 LAB — MRSA PCR SCREENING: MRSA by PCR: NEGATIVE

## 2015-11-16 MED ORDER — SODIUM CHLORIDE 0.9 % IV SOLN: Freq: Once | INTRAVENOUS | Status: DC

## 2015-11-16 NOTE — Progress Notes (Signed)
[facility]  PROGRESS NOTE  [name] [id number] DOB: 09-25-1932 DOA: 11/15/2015 PCP: [name], MD    Brief HPI:   82 y.o. female with a medical history significant for, but not  limited to, hypertension, diabetes, PAD, and end-stage renal disease on hemodialysis. Brought to ED by EMS this a.m. for evaluation of bleeding from her dialysis graft in left arm. Patient was able to complete full dialysis yesterday Seen in the ED a week and half ago for similar bleeding. At which time she also complained of right lower extremity pain. Doppler study negative for DVT. She has a follow-up appointment with vascular surgery in the next few days.    Assessment/Plan:    1. Bleeding from left AV graft- patient underwent thrombectomy and revision of AV graft, also had placement of right femoral dialysis catheter. Can use her left arm graft in 3 weeks as per vascular surgery. 2. Anemia due to blood loss- patient's hemoglobin dropped to 6.5, she decided 1 unit of PRBC. We will recheck hemoglobin, and transfuse as needed. 3. ESRD- patient on hemodialysis Monday Wednesday Friday, last dialysis on Friday. If she requires more  blood transfusion, will consult nephrology for possible hemodialysis in a.m. 4. Constipation- KUB shows nonobstructive bowel gas pattern.  5. History of breast cancer- status post total mastectomy for right-sided invasive ductal carcinoma in April 2015. Also has remote history of left breast cancer. Continue Arimidex 6. Hypertension- blood pressures is well  controlled, continue Toprol-XL.   DVT prophylaxis: SCDs Code Status: Full code Family Communication: No family at bedside  Disposition Plan: Home when hemoglobin stable   Consultants:  Vascular surgery  Procedures:  Thrombectomy and revision of AV graft , insertion of right femoral dialysis catheter  Antibiotics:  None  Subjective   Patient seen and examined, admitted with bleeding from AV  graft. Patient was seen by vascular surgery underwent thrombectomy and revision of left AV graft.  Objective    Objective: Vitals:   11/16/15 0435 11/16/15 0505 11/16/15 0715 11/16/15 1000  BP: (!) 109/32 (!) 123/36 (!) 137/34 (!) 145/48  Pulse: (!) 58 65 64 66  Resp: 16 18 18 18   Temp: 98.4 F (36.9 C) 98.1 F (36.7 C) 98.2 F (36.8 C) 98.5 F (36.9 C)  TempSrc: Oral Oral Oral Oral  SpO2: 100% 98% 99% 98%  Weight:      Height:        Intake/Output Summary (Last 24 hours) at 11/16/15 1223 Last data filed at 11/16/15 0900  Gross per 24 hour  Intake             1290 ml  Output               50 ml  Net             1240 ml   Filed Weights   11/15/15 1021 11/15/15 1852 11/15/15 2115  Weight: 46.7 kg (103 lb) 47 kg (103 lb 9.9 oz) 47.2 kg (104 lb 0.9 oz)    Examination:  General exam: Appears calm and comfortable  Respiratory system: Clear to auscultation. Respiratory effort normal. Cardiovascular system: S1 & S2 heard, RRR. No JVD, murmurs, rubs, gallops or clicks. No pedal edema. Gastrointestinal system: Abdomen is nondistended, soft and nontender. No organomegaly or masses felt. Normal bowel sounds heard. Central nervous system: Alert and oriented. No focal neurological deficits. Extremities: Symmetric 5 x 5 power. Skin: No rashes, lesions or ulcers Psychiatry: Judgement and insight appear normal. Mood &  affect appropriate.    Data Reviewed: I have personally reviewed following labs and imaging studies Basic Metabolic Panel:  Recent Labs Lab 11/15/15 1017 11/16/15 0343  NA 136 134*  K 3.6 3.9  CL 97* 100*  CO2 30 28  GLUCOSE 115* 143*  BUN 9 14  CREATININE 2.52* 3.06*  CALCIUM 9.8 9.5   Liver Function Tests:  Recent Labs Lab 11/15/15 1017  AST 25  ALT 14  ALKPHOS 76  BILITOT 0.6  PROT 5.9*  ALBUMIN 2.3*   No results for input(s): LIPASE, AMYLASE in the last 168 hours. No results for input(s): AMMONIA in the last 168 hours. CBC:  Recent  Labs Lab 11/15/15 1017 11/15/15 1918 11/16/15 0343  WBC 10.5 11.0* 13.1*  NEUTROABS 8.5*  --   --   HGB 8.9* 7.3* 6.5*  HCT 28.3* 23.1* 20.1*  MCV 93.1 93.9 92.6  PLT 202 153 163   Cardiac Enzymes: No results for input(s): CKTOTAL, CKMB, CKMBINDEX, TROPONINI in the last 168 hours. BNP (last 3 results) No results for input(s): BNP in the last 8760 hours.  ProBNP (last 3 results) No results for input(s): PROBNP in the last 8760 hours.  CBG:  Recent Labs Lab 11/15/15 1312 11/15/15 1717 11/15/15 1748  GLUCAP 71 58* 114*    No results found for this or any previous visit (from the past 240 hour(s)).   Studies: Dg Chest Port 1 View  Result Date: 11/15/2015 CLINICAL DATA:  Status post dialysis catheter insertion through the right groin. EXAM: PORTABLE CHEST 1 VIEW COMPARISON:  12/27/2013 FINDINGS: Large bore catheter is seen from inferior approach with tip overlying the expected location of the proximal right atrium. Vascular stent in the region of the left brachiocephalic vein is stable. Cardiomediastinal silhouette is normal. Mediastinal contours appear intact. There is no evidence of focal airspace consolidation, pleural effusion or pneumothorax. Osseous structures are without acute abnormality. Soft tissues are grossly normal. IMPRESSION: No active disease. Large bore catheter from the inferior approach terminates in the expected location of proximal right atrium. Electronically Signed   By: Fidela Salisbury M.D.   On: 11/15/2015 16:52   Dg Abd Portable 1v  Result Date: 11/15/2015 CLINICAL DATA:  Change in bowel habits EXAM: PORTABLE ABDOMEN - 1 VIEW COMPARISON:  03/24/2012 FINDINGS: There is moderate gaseous distension of the lower abdominal and pelvic bowel loops. Average volume of stool noted within the colon. No dilated loops of small bowel identified. Right groin dialysis catheter is identified. The tip is within the cavoatrial junction. IMPRESSION: 1. Nonobstructive bowel  gas pattern. 2. The dialysis catheter tip is in the cavoatrial junction Electronically Signed   By: Kerby Moors M.D.   On: 11/15/2015 16:40   Dg Fluoro Guide Cv Line-no Report  Result Date: 11/15/2015 CLINICAL DATA:  FLOURO GUIDE CV LINE Fluoroscopy was utilized by the requesting physician.  No radiographic interpretation.    Scheduled Meds: . sodium chloride   Intravenous Once  . sodium chloride   Intravenous Once  . anastrozole  1 mg Oral QPC supper  . cinacalcet  60 mg Oral Q supper  . lanthanum  1,000 mg Oral Q supper  . metoprolol succinate  50 mg Oral QPC supper  . sodium chloride flush  3 mL Intravenous Q12H   Continuous Infusions:      Time spent: 25 min    [facility] Pager [phone]. If 7PM-7AM, please contact night-coverage at [email or web address], Office  [phone]  password [facility] 11/16/2015, 12:23  PM  LOS: 0 days

## 2015-11-16 NOTE — Progress Notes (Signed)
While assisting NT with bathing patient, this RN noted tan/pink foul smelling drainage coming from right, bottom of foot/heel. MD notified and orders for bilateral foot xray and wound consult received.

## 2015-11-16 NOTE — Progress Notes (Signed)
Subjective: Interval History: none.. Comfortable. Receiving packed red blood cells currently. Mild discomfort in her arm  Objective: Vital signs in last 24 hours: Temp:  [97 F (36.1 C)-98.8 F (37.1 C)] 98.1 F (36.7 C) (09/10 0505) Pulse Rate:  [58-88] 65 (09/10 0505) Resp:  [13-20] 18 (09/10 0505) BP: (109-157)/(23-92) 123/36 (09/10 0505) SpO2:  [92 %-100 %] 98 % (09/10 0505) Weight:  [103 lb (46.7 kg)-104 lb 0.9 oz (47.2 kg)] 104 lb 0.9 oz (47.2 kg) (09/09 2115)  Intake/Output from previous day: 09/09 0701 - 09/10 0700 In: 1050 [P.O.:300; I.V.:500; IV Piggyback:250] Out: 50 [Blood:50] Intake/Output this shift: Total I/O In: 300 [P.O.:300] Out: 0   Dressing removed from left arm. Antecubital and axillary incisions healing with Steri-Strips in place. Good bruit and easily palpable pulse in her graft. Open areas or graft without bleeding.  Lab Results:  Recent Labs  11/15/15 1918 11/16/15 0343  WBC 11.0* 13.1*  HGB 7.3* 6.5*  HCT 23.1* 20.1*  PLT 153 163   BMET  Recent Labs  11/15/15 1017 11/16/15 0343  NA 136 134*  K 3.6 3.9  CL 97* 100*  CO2 30 28  GLUCOSE 115* 143*  BUN 9 14  CREATININE 2.52* 3.06*  CALCIUM 9.8 9.5    Studies/Results: Dg Chest Port 1 View  Result Date: 11/15/2015 CLINICAL DATA:  Status post dialysis catheter insertion through the right groin. EXAM: PORTABLE CHEST 1 VIEW COMPARISON:  12/27/2013 FINDINGS: Large bore catheter is seen from inferior approach with tip overlying the expected location of the proximal right atrium. Vascular stent in the region of the left brachiocephalic vein is stable. Cardiomediastinal silhouette is normal. Mediastinal contours appear intact. There is no evidence of focal airspace consolidation, pleural effusion or pneumothorax. Osseous structures are without acute abnormality. Soft tissues are grossly normal. IMPRESSION: No active disease. Large bore catheter from the inferior approach terminates in the expected  location of proximal right atrium. Electronically Signed   By: Fidela Salisbury M.D.   On: 11/15/2015 16:52   Dg Abd Portable 1v  Result Date: 11/15/2015 CLINICAL DATA:  Change in bowel habits EXAM: PORTABLE ABDOMEN - 1 VIEW COMPARISON:  03/24/2012 FINDINGS: There is moderate gaseous distension of the lower abdominal and pelvic bowel loops. Average volume of stool noted within the colon. No dilated loops of small bowel identified. Right groin dialysis catheter is identified. The tip is within the cavoatrial junction. IMPRESSION: 1. Nonobstructive bowel gas pattern. 2. The dialysis catheter tip is in the cavoatrial junction Electronically Signed   By: Kerby Moors M.D.   On: 11/15/2015 16:40   Dg Fluoro Guide Cv Line-no Report  Result Date: 11/15/2015 CLINICAL DATA:  FLOURO GUIDE CV LINE Fluoroscopy was utilized by the requesting physician.  No radiographic interpretation.   Anti-infectives: Anti-infectives    None      Assessment/Plan: s/p Procedure(s): Thrombectomy and Revision of Left ARm AV Gortex Graft. (Left) INSERTION OF Right Femoral  DIALYSIS CATHETER. (Right) Stable overall. Can use her new left arm graft in 3 weeks. Has right femoral catheter for use in the interim. Severe anemia related to her preoperative bleeding. Receiving packed blood cells per primary service.   LOS: 0 days   Curt Jews 11/16/2015, 6:57 AM

## 2015-11-17 ENCOUNTER — Encounter (HOSPITAL_COMMUNITY): Payer: Self-pay | Admitting: Vascular Surgery

## 2015-11-17 DIAGNOSIS — T82838A Hemorrhage of vascular prosthetic devices, implants and grafts, initial encounter: Principal | ICD-10-CM

## 2015-11-17 DIAGNOSIS — M869 Osteomyelitis, unspecified: Secondary | ICD-10-CM

## 2015-11-17 DIAGNOSIS — D649 Anemia, unspecified: Secondary | ICD-10-CM

## 2015-11-17 LAB — CBC
HCT: 24.9 % — ABNORMAL LOW (ref 36.0–46.0)
Hemoglobin: 8.1 g/dL — ABNORMAL LOW (ref 12.0–15.0)
MCH: 29.7 pg (ref 26.0–34.0)
MCHC: 32.5 g/dL (ref 30.0–36.0)
MCV: 91.2 fL (ref 78.0–100.0)
Platelets: 149 10*3/uL — ABNORMAL LOW (ref 150–400)
RBC: 2.73 MIL/uL — ABNORMAL LOW (ref 3.87–5.11)
RDW: 14.6 % (ref 11.5–15.5)
WBC: 15.3 10*3/uL — ABNORMAL HIGH (ref 4.0–10.5)

## 2015-11-17 LAB — RENAL FUNCTION PANEL
Albumin: 2.2 g/dL — ABNORMAL LOW (ref 3.5–5.0)
Anion gap: 9 (ref 5–15)
BUN: 20 mg/dL (ref 6–20)
CO2: 29 mmol/L (ref 22–32)
Calcium: 9.9 mg/dL (ref 8.9–10.3)
Chloride: 95 mmol/L — ABNORMAL LOW (ref 101–111)
Creatinine, Ser: 4.1 mg/dL — ABNORMAL HIGH (ref 0.44–1.00)
GFR calc Af Amer: 11 mL/min — ABNORMAL LOW (ref >60)
GFR calc non Af Amer: 9 mL/min — ABNORMAL LOW (ref >60)
Glucose, Bld: 81 mg/dL (ref 65–99)
Phosphorus: 3.2 mg/dL (ref 2.5–4.6)
Potassium: 4.7 mmol/L (ref 3.5–5.1)
Sodium: 133 mmol/L — ABNORMAL LOW (ref 135–145)

## 2015-11-17 LAB — TYPE AND SCREEN
ABO/RH(D): B POS
ANTIBODY SCREEN: NEGATIVE
Unit division: 0

## 2015-11-17 MED ORDER — DARBEPOETIN ALFA 100 MCG/0.5ML IJ SOSY
100.0000 ug | PREFILLED_SYRINGE | INTRAMUSCULAR | Status: DC
  Administered 2015-11-17 – 2015-11-24 (×2): 100 ug via INTRAVENOUS
  Filled 2015-11-17 (×2): qty 0.5

## 2015-11-17 MED ORDER — HEPARIN SODIUM (PORCINE) 1000 UNIT/ML DIALYSIS: 1000.0000 [IU] | INTRAMUSCULAR | Status: DC | PRN

## 2015-11-17 MED ORDER — ALTEPLASE 2 MG IJ SOLR: 2.0000 mg | Freq: Once | INTRAMUSCULAR | Status: DC | PRN

## 2015-11-17 MED ORDER — SODIUM CHLORIDE 0.9 % IV SOLN: 100.0000 mL | INTRAVENOUS | Status: DC | PRN

## 2015-11-17 MED ORDER — OXYCODONE-ACETAMINOPHEN 5-325 MG PO TABS
1.0000 | ORAL_TABLET | Freq: Three times a day (TID) | ORAL | Status: DC | PRN
  Administered 2015-11-17 – 2015-11-21 (×7): 2 via ORAL
  Administered 2015-11-23: 1 via ORAL
  Administered 2015-11-23: 2 via ORAL
  Administered 2015-11-24: 1 via ORAL
  Filled 2015-11-17 (×2): qty 2
  Filled 2015-11-17: qty 1
  Filled 2015-11-17 (×3): qty 2
  Filled 2015-11-17: qty 1
  Filled 2015-11-17 (×3): qty 2

## 2015-11-17 MED ORDER — LIDOCAINE HCL (PF) 1 % IJ SOLN: 5.0000 mL | INTRAMUSCULAR | Status: DC | PRN

## 2015-11-17 MED ORDER — CALCITRIOL 0.5 MCG PO CAPS
ORAL_CAPSULE | ORAL | Status: AC
  Filled 2015-11-17: qty 1

## 2015-11-17 MED ORDER — PIPERACILLIN-TAZOBACTAM 3.375 G IVPB
3.3750 g | Freq: Two times a day (BID) | INTRAVENOUS | Status: DC
  Administered 2015-11-17 – 2015-11-22 (×11): 3.375 g via INTRAVENOUS
  Filled 2015-11-17 (×13): qty 50

## 2015-11-17 MED ORDER — ACETAMINOPHEN 325 MG PO TABS
ORAL_TABLET | ORAL | Status: AC
  Administered 2015-11-17: 650 mg via ORAL
  Filled 2015-11-17: qty 2

## 2015-11-17 MED ORDER — VANCOMYCIN HCL IN DEXTROSE 1-5 GM/200ML-% IV SOLN
1000.0000 mg | INTRAVENOUS | Status: AC
  Administered 2015-11-17: 1000 mg via INTRAVENOUS
  Filled 2015-11-17 (×2): qty 200

## 2015-11-17 MED ORDER — HEPARIN SODIUM (PORCINE) 1000 UNIT/ML DIALYSIS: 20.0000 [IU]/kg | INTRAMUSCULAR | Status: DC | PRN

## 2015-11-17 MED ORDER — DARBEPOETIN ALFA 100 MCG/0.5ML IJ SOSY
PREFILLED_SYRINGE | INTRAMUSCULAR | Status: AC
  Filled 2015-11-17: qty 0.5

## 2015-11-17 MED ORDER — RENA-VITE PO TABS
1.0000 | ORAL_TABLET | Freq: Every day | ORAL | Status: DC
  Administered 2015-11-17 – 2015-11-23 (×7): 1 via ORAL
  Filled 2015-11-17 (×7): qty 1

## 2015-11-17 MED ORDER — LIDOCAINE-PRILOCAINE 2.5-2.5 % EX CREA: 1.0000 "application " | TOPICAL_CREAM | CUTANEOUS | Status: DC | PRN

## 2015-11-17 MED ORDER — PENTAFLUOROPROP-TETRAFLUOROETH EX AERO: 1.0000 "application " | INHALATION_SPRAY | CUTANEOUS | Status: DC | PRN

## 2015-11-17 MED ORDER — NEPRO/CARBSTEADY PO LIQD
237.0000 mL | Freq: Two times a day (BID) | ORAL | Status: DC
  Administered 2015-11-18 – 2015-11-24 (×6): 237 mL via ORAL

## 2015-11-17 MED ORDER — VANCOMYCIN HCL 500 MG IV SOLR
500.0000 mg | INTRAVENOUS | Status: DC
  Administered 2015-11-19 – 2015-11-21 (×2): 500 mg via INTRAVENOUS
  Filled 2015-11-17 (×2): qty 500

## 2015-11-17 MED ORDER — CALCITRIOL 0.5 MCG PO CAPS
0.5000 ug | ORAL_CAPSULE | ORAL | Status: DC
  Administered 2015-11-17: 0.5 ug via ORAL

## 2015-11-17 NOTE — Progress Notes (Signed)
Pharmacy Antibiotic Note  [name] is a 82 y.o. female admitted on 11/15/2015 with osteomyelitis.  Pharmacy has been consulted for vancomycin and Zosyn dosing.  Plan: -Vancomycin 1g IV x1 today as loading dose (to be given after HD), then vancomycin 500mg  IV qHD-MWF starting 9/13. -Pre-HD vancomycin level goal 15-23mcg/mL -Zosyn 3.375g IV q12h EI -follow c/s, HD schedule/tolerance, surgical plans, LOT  Height: 5\' 3"  (160 cm) Weight: 104 lb 0.9 oz (47.2 kg) IBW/kg (Calculated) : 52.4  Temp (24hrs), Avg:97.8 F (36.6 C), Min:97.5 F (36.4 C), Max:98 F (36.7 C)   Recent Labs Lab 11/15/15 1017 11/15/15 1918 11/16/15 0343 11/16/15 1157  WBC 10.5 11.0* 13.1* 14.5*  CREATININE 2.52*  --  3.06*  --     Estimated Creatinine Clearance: 10.9 mL/min (by C-G formula based on SCr of 3.06 mg/dL).    Allergies  Allergen Reactions  . Aspirin Other (See Comments)    NO BLOOD THINNERS OF ANY KIND-bleeding events    Antimicrobials this admission: Vanc 9/11 >>  Zosyn 9/11 >>   Dose adjustments this admission: N/a  Microbiology results: 9/10 MRSA PCR: neg  Thank you for allowing pharmacy to be a part of this patient's care.  Demi Trieu D. Jalexa Pifer, PharmD, BCPS Clinical Pharmacist Pager: [phone] 11/17/2015 11:48 AM

## 2015-11-17 NOTE — Consult Note (Signed)
[facility] Nurse wound consult note Reason for Consult: Bilateral heel ulcers Wound type: Diabetic foot ulcers in presence of PVD, ESRD, and HTN. Patient states the right foot is very painful to touch.  Measurements:  Left lateral heel presents with 100% eschar 2 cm x 2.5 cm.  All toes on the left foot are blackened.  The great toe tip and the 4th toe tip are dried and necrotic.  Toes 4 & 5 are "fused" together with some substance that could be really thick dried drainage or old tissue, I cannot be sure what exactly it is.  I did not try to pull them apart.    Right lateral heel has eschar measuring 2 cm x 4 cm.   Right lateral mid-foot has eschar measuring 1 cm x 2 cm.   Right mid-foot plantar surface has an area measuring 5 cm x 2 cm that has an open fissure.  The great toe on the right foot and the second toe have been previously amputated and the area healed.  Toes 3 - 5 on the right foot are dried and blackened.  Both feet are very heavy with hyperkeratosis.  I could not palpate a pedal pulse to either foot.  I suspect the patient may have wet gangrene to the right foot and as far as I can tell this is the foot from which the foul odor is originating.  I suspect the left foot has dry gangrene of the toes.  Recommendation:  Consider vascular or orthopedic surgery consult for surgical intervention.  There isn't anything topically to be done to remedy the situation.  Continue to elevate the heels off of the mattress.  Consider applying betadine solution to all eschar areas.  Discussed POC with patient and bedside nurse.  Re consult if needed, will not follow at this time. Thanks Val Riles MSN, RN, CNS-BC, [employer]

## 2015-11-17 NOTE — Consult Note (Signed)
[facility] Renal Consultation Note    Indication for Consultation:  Management of ESRD/hemodialysis; anemia, hypertension/volume and secondary hyperparathyroidism  HPI: [name] is a 82 y.o. female with ESRD secondary to diabetes on hemodialysis for almost 12 years who presented 9/9 with bleeding from her left arm graft.  She had been see previously in the ED for a similar episode on 8/29. Doppler at that time showed no sig hemodynamic stenosis but there was an area of tortuosity near the outflow with increased velocities.  She was evaluated by Dr. Donnetta Hutching 9/9 who felt that her [place] was high risk for rupture due to a friable thinning area so she was taken for immediate new access and temporary tunneled dialysis catheter placement the same day . Hgb on admission was 7.3 down from 10 on 9/6 and 12 8/23.  She was transfused 1 unit PRBC with hgb of 8.5 today.  WBC was slightly  elevated upon admission and up to 14.5 today. She has been afebrile but tachycardic this am up to 130. SBP is stable in the 130 - 140 range which is a little lower than usual for her on a dialysis day.  She denies fever and chills.  Podiatry has been managing feet issues for a while.  She is nonambulatory. She has be reticent about allowing staff to do diabetic foot checks at her outpt unit. She has issues with stool and urine incontinence. She has not cough, SOB, CP. No recent problems with her dialysis treatments except for bleeding from her access.    Past Medical History:  Diagnosis Date  . Anemia   . Arthritis   . Bilateral breast cancer ([place])   . DM (diabetes mellitus) ([place])   . Dyslipidemia   . ESRD (end stage renal disease) on dialysis ([place])    a. [place]: MWF (07/10/2014)  . Hepatitis C   . Hyperparathyroidism   . Hypertension   . PVD (peripheral vascular disease) ([place])    a. s/p peripheral angiogram on 07/10/14 with no PCI and cont med Rx  . Vascular disease    Past Surgical History:  Procedure  Laterality Date  . ABDOMINAL HYSTERECTOMY    . APPENDECTOMY    . ARTERIOVENOUS GRAFT PLACEMENT Left 03/20/09   "had right arm previously but couldn't use it anymore"  . BACK SURGERY    . BREAST BIOPSY Bilateral   . BREAST BIOPSY Right 08/08/2013   Procedure: Evacuation Hematoma Right Chest;  Surgeon: [name], MD;  Location: [facility];  Service: General;  Laterality: Right;  . CATARACT EXTRACTION Right   . DG AV DIALYSIS GRAFT DECLOT OR Left 01/27/11, 02/15/11, 03/15/11, 10/13/11   lua  . EVACUATION BREAST HEMATOMA  2001; 2015   left; right  . MASTECTOMY Left 1970's?  Marland Kitchen MASTECTOMY COMPLETE / SIMPLE Right 08/07/2013  . MEDIAN NERVE REPAIR Left    "decompression"  . PERIPHERAL VASCULAR CATHETERIZATION N/A 07/10/2014   Procedure: Abdominal Aortogram;  Surgeon: Wellington Hampshire, MD;  Location: [facility];  Service: Cardiovascular;  Laterality: N/A;  . POSTERIOR FUSION CERVICAL SPINE     "have 6 screws"  . THROMBECTOMY AND REVISION OF ARTERIOVENTOUS (AV) GORETEX  GRAFT Left 04/18/2009  . TOE AMPUTATION Right    1,2nd toes  . TONSILLECTOMY    . TOTAL MASTECTOMY Right 08/07/2013   Procedure: RIGHT TOTAL MASTECTOMY;  Surgeon: [name], MD;  Location: [facility];  Service: General;  Laterality: Right;  . UTERINE FIBROID  SURGERY     Family History  Problem Relation Age of Onset  . Cancer Mother   . Hypertension Mother    Social History:  reports that she has quit smoking. Her smoking use included Cigarettes. She has never used smokeless tobacco. She reports that she drinks alcohol. She reports that she does not use drugs. Allergies  Allergen Reactions  . Aspirin Other (See Comments)    NO BLOOD THINNERS OF ANY KIND-bleeding events   Prior to Admission medications   Medication Sig Start Date End Date Taking? Authorizing Provider  anastrozole (ARIMIDEX) 1 MG tablet Take 1 tablet (1 mg total) by mouth daily. Patient taking differently: Take 1 mg by mouth daily after supper.   06/17/15  Yes Wyatt Portela, MD  B Complex-C-Folic Acid (DIALYVITE TABLET) TABS Take 1 tablet by mouth daily after supper. 10/31/15  Yes Historical Provider, MD  cinacalcet (SENSIPAR) 60 MG tablet Take 60 mg by mouth daily after supper.   Yes Historical Provider, MD  ibuprofen (ADVIL,MOTRIN) 200 MG tablet Take 400 mg by mouth every 6 (six) hours as needed for fever, headache or mild pain.    Yes Historical Provider, MD  lanthanum (FOSRENOL) 1000 MG chewable tablet Chew 1,000 mg by mouth daily after supper.    Yes Historical Provider, MD  metoprolol succinate (TOPROL-XL) 50 MG 24 hr tablet Take 50 mg by mouth daily after supper. Take with or immediately following a meal.    Yes Historical Provider, MD  traMADol (ULTRAM) 50 MG tablet Take 1 tablet (50 mg total) by mouth every 12 (twelve) hours as needed for moderate pain. 10/27/15  Yes Davonna Belling, MD   Current Facility-Administered Medications  Medication Dose Route Frequency Provider Last Rate Last Dose  . 0.9 %  sodium chloride infusion   Intravenous Once Willia Craze, NP      . 0.9 %  sodium chloride infusion   Intravenous Once Rosetta Posner, MD      . acetaminophen (TYLENOL) tablet 650 mg  650 mg Oral Q6H PRN Willia Craze, NP       Or  . acetaminophen (TYLENOL) suppository 650 mg  650 mg Rectal Q6H PRN Willia Craze, NP      . anastrozole (ARIMIDEX) tablet 1 mg  1 mg Oral QPC supper Willia Craze, NP   1 mg at 11/16/15 1848  . calcitRIOL (ROCALTROL) capsule 0.5 mcg  0.5 mcg Oral Q M,W,F-HD Alric Seton, PA-C      . cinacalcet (SENSIPAR) tablet 60 mg  60 mg Oral Q supper Willia Craze, NP   60 mg at 11/16/15 1753  . Darbepoetin Alfa (ARANESP) injection 100 mcg  100 mcg Intravenous Q Mon-HD Alric Seton, PA-C      . lanthanum [facility]) chewable tablet 1,000 mg  1,000 mg Oral Q supper Willia Craze, NP   1,000 mg at 11/16/15 1754  . metoprolol succinate (TOPROL-XL) 24 hr tablet 50 mg  50 mg Oral QPC supper Willia Craze, NP   50 mg at 11/16/15 1753  . ondansetron (ZOFRAN) tablet 4 mg  4 mg Oral Q6H PRN Willia Craze, NP       Or  . ondansetron [facility]) injection 4 mg  4 mg Intravenous Q6H PRN Willia Craze, NP      . sodium chloride flush (NS) 0.9 % injection 3 mL  3 mL Intravenous Q12H Willia Craze, NP   3 mL at 11/17/15 0821  .  traMADol (ULTRAM) tablet 50 mg  50 mg Oral Q8H PRN Kinnie Feil, MD   50 mg at 11/16/15 1753   Labs: Basic Metabolic Panel:  Recent Labs Lab 11/15/15 1017 11/16/15 0343  NA 136 134*  K 3.6 3.9  CL 97* 100*  CO2 30 28  GLUCOSE 115* 143*  BUN 9 14  CREATININE 2.52* 3.06*  CALCIUM 9.8 9.5   Liver Function Tests:  Recent Labs Lab 11/15/15 1017  AST 25  ALT 14  ALKPHOS 76  BILITOT 0.6  PROT 5.9*  ALBUMIN 2.3*   CBC:  Recent Labs Lab 11/15/15 1017 11/15/15 1918 11/16/15 0343 11/16/15 1157  WBC 10.5 11.0* 13.1* 14.5*  NEUTROABS 8.5*  --   --   --   HGB 8.9* 7.3* 6.5* 8.5*  HCT 28.3* 23.1* 20.1* 25.9*  MCV 93.1 93.9 92.6 91.8  PLT 202 153 163 142*  CBG:  Recent Labs Lab 11/15/15 1312 11/15/15 1717 11/15/15 1748  GLUCAP 71 58* 114*   Studies/Results: Dg Chest Port 1 View  Result Date: 11/15/2015 CLINICAL DATA:  Status post dialysis catheter insertion through the right groin. EXAM: PORTABLE CHEST 1 VIEW COMPARISON:  12/27/2013 FINDINGS: Large bore catheter is seen from inferior approach with tip overlying the expected location of the proximal right atrium. Vascular stent in the region of the left brachiocephalic vein is stable. Cardiomediastinal silhouette is normal. Mediastinal contours appear intact. There is no evidence of focal airspace consolidation, pleural effusion or pneumothorax. Osseous structures are without acute abnormality. Soft tissues are grossly normal. IMPRESSION: No active disease. Large bore catheter from the inferior approach terminates in the expected location of proximal right atrium. Electronically Signed   By:  Fidela Salisbury M.D.   On: 11/15/2015 16:52   Dg Abd Portable 1v  Result Date: 11/15/2015 CLINICAL DATA:  Change in bowel habits EXAM: PORTABLE ABDOMEN - 1 VIEW COMPARISON:  03/24/2012 FINDINGS: There is moderate gaseous distension of the lower abdominal and pelvic bowel loops. Average volume of stool noted within the colon. No dilated loops of small bowel identified. Right groin dialysis catheter is identified. The tip is within the cavoatrial junction. IMPRESSION: 1. Nonobstructive bowel gas pattern. 2. The dialysis catheter tip is in the cavoatrial junction Electronically Signed   By: Kerby Moors M.D.   On: 11/15/2015 16:40   Dg Foot Complete Left  Result Date: 11/16/2015 CLINICAL DATA:  Osteomyelitis.  Left foot pain. EXAM: LEFT FOOT - COMPLETE 3+ VIEW COMPARISON:  None. FINDINGS: There is irregularity of the distal left first toe soft tissues. There is absence of the distal portion of the distal phalanx in the left first toe with associated erosive change at the distal osseous margin. There are multiple cortical erosions in the middle and proximal phalanges of the left fourth toe, with potential postsurgical changes in the proximal phalanx of the left fourth toe. Diffuse osteopenia. No fracture or dislocation. No suspicious focal osseous lesion. Diffuse vascular calcifications. No appreciable soft tissue gas. No periosteal reaction. IMPRESSION: 1. Absence of the distal portion of the distal phalanx in the left first toe with associated erosive change at the distal osseous margin and overlying soft tissue irregularity, suggesting acute osteomyelitis, potentially at the site of prior amputation, correlate with surgical history. 2. Multiple cortical erosions in the left fourth toe suggestive of acute osteomyelitis with potential postsurgical changes in the proximal phalanx of the left fourth toe, correlate with surgical history. 3. Diffuse osteopenia. Electronically Signed   By: Ilona Sorrel  M.D.    On: 11/16/2015 18:00   Dg Foot Complete Right  Result Date: 11/16/2015 CLINICAL DATA:  Osteomyelitis EXAM: RIGHT FOOT COMPLETE - 3+ VIEW COMPARISON:  None. FINDINGS: Three views of the right foot submitted. There is diffuse osteopenia. The patient is status post amputation of first and second toes. There is diffuse osteopenia. No definite evidence of bone destruction to suggest osteomyelitis. Extensive atherosclerotic vascular calcifications are noted. No acute fracture or subluxation IMPRESSION: No acute fracture or subluxation. Diffuse osteopenia. Status post amputation of first and second toes. No definite evidence of bone destruction to suggest osteomyelitis. Electronically Signed   By: Lahoma Crocker M.D.   On: 11/16/2015 17:51   Dg Fluoro Guide Cv Line-no Report  Result Date: 11/15/2015 CLINICAL DATA:  FLOURO GUIDE CV LINE Fluoroscopy was utilized by the requesting physician.  No radiographic interpretation.    ROS: As per HPI otherwise negative.  Physical Exam: Vitals:   11/16/15 1756 11/16/15 1800 11/16/15 2146 11/17/15 0458  BP:  (!) 137/43 (!) 130/47 (!) 143/43  Pulse: 68 60 66 (!) 131  Resp:  18 16 16   Temp:  98 F (36.7 C) 97.7 F (36.5 C) 97.5 F (36.4 C)  TempSrc:  Oral Oral Oral  SpO2:  98% 100% 90%  Weight:      Height:         General: thin elderly AAF Head: NCAT sclera not icteric MMM Neck: Supple.  Lungs: CTA clear anteriorly no rales, or rhonchi. Breathing is unlabored. Heart: RRR  Abdomen:  soft NT + BS M-S:  muscle wasting Lower extremities:  1+ edema toes black, right heel ulcer Neuro: recognizes my face from outpatient dialysis center-  Psych:  Responds to questions appropriately with a usual affect. Dialysis Access: new left upper AVGG -wrapped with moderate LUE edema + bruit and right fem cath - dressing intact   Dialysis Orders: [facility] 3.75 hours EDW 47 - needs to be lowered a little at d/c due to LE edema- 3K 2.5 Ca heparin 1700 calcitriol 0.5 profile  4 - NEW right fem cath and left upper AVGG 9/9 Early Last Mircera 75 on 7/19 Recent labs: hgb 12 8/23 w 42% sat down to 10 9/6 - iPTH 324  Assessment/Plan: 1. s/p placement of new right tunneld fem cath and new left upper AVGG - due to recurrent bleeding with acute blood loss from old left upper AVGG 9/9- plan use fem cath today - has moderate left arm swelling -  2. ESRD -  MWF - HD today- check labs - pre - start on 3 K bath 3. Hypertension/volume  - BP ok - actually a little lower than baseline - titrate EDW/on MTP 50 4. Anemia  - transfused 1 unit PRBC 9/10 for hgb of 6.5 - up to 8.5 - resume Aranesp today - start weekly venofer 50 at discharge 5. Metabolic bone disease -  Continue VDRA/sensipar/fosrenol 6. Nutrition - poor- added vitamins and nepro - doesn't like to eat breakfast 7. PAD with acute left 1st and 4th toe osteo per xrays- chronic right heel ulcer and necrotic toes bilaterally- previously being followed by podiatry with conservative treatment- defer management to [facility]- has mild leukocytosis. I told her that I did not expect her toes to improve. 8. Hx breast cancer - s/p right mastectomy on arimidex 9. Disp - ultimately to return home - has no children - has two caregivers that share her care but no one actually spends the night - they  tuck her into bed with her phone and come back in the am early enough on dialysis days to get her ready for dialysis  Myriam Jacobson, PA-C [place] [phone] 11/17/2015, 9:09 AM   Pt seen, examined and agree w A/P as above.  Kelly Splinter MD [employer] pager [phone]    cell [phone] 11/17/2015, 12:32 PM

## 2015-11-17 NOTE — Progress Notes (Signed)
[facility]  PROGRESS NOTE  [name] [id number] DOB: Nov 27, 1932 DOA: 11/15/2015 PCP: [name], MD    Brief HPI:   82 y.o. female with a medical history significant for, but not  limited to, hypertension, diabetes, PAD, and end-stage renal disease on hemodialysis. Brought to ED by EMS this a.m. for evaluation of bleeding from her dialysis graft in left arm. Patient was able to complete full dialysis yesterday Seen in the ED a week and half ago for similar bleeding. At which time she also complained of right lower extremity pain. Doppler study negative for DVT. She has a follow-up appointment with vascular surgery in the next few days.    Assessment/Plan:    1. Bleeding from left AV graft- patient underwent thrombectomy and revision of AV graft, also had placement of right femoral dialysis catheter. Can use her left arm graft in 3 weeks as per vascular surgery. 2. Anemia due to blood loss- patient's hemoglobin dropped to 6.5, status post 1 unit PRBC. Repeat Hemoglobin is 8.1 3. Osteomyelitis of left first and fourth toe- consulted orthopedics Dr. Sharol Given to see the patient in a.m. Also called vascular surgery, patient not a candidate for revascularization therapy. Started on vancomycin and Zosyn for pharmacy consultation 4. ESRD- patient on hemodialysis Monday Wednesday Friday, last dialysis on Friday. If she requires more  blood transfusion, will consult nephrology for possible hemodialysis in a.m. 5. Constipation- KUB shows nonobstructive bowel gas pattern.  6. History of breast cancer- status post total mastectomy for right-sided invasive ductal carcinoma in April 2015. Also has remote history of left breast cancer. Continue Arimidex 7. Hypertension- blood pressures is well  controlled, continue Toprol-XL.   DVT prophylaxis: SCDs Code Status: Full code Family Communication: No family at bedside  Disposition Plan: Home when hemoglobin  stable   Consultants:  Vascular surgery  Procedures:  Thrombectomy and revision of AV graft , insertion of right femoral dialysis catheter  Antibiotics:  None  Subjective   Patient seen and examined, admitted with bleeding from AV graft. Patient was seen by vascular surgery underwent thrombectomy and underwent  revision of left AV graft.Xray of feet showed osteomyelitis of left first and fourth toe  Objective    Objective: Vitals:   11/17/15 1109 11/17/15 1300 11/17/15 1317 11/17/15 1322  BP: (!) 146/42 (!) 138/55 (!) 144/50 (!) 137/45  Pulse: 61 63 68 63  Resp: 18 15 17 18   Temp: 97.9 F (36.6 C) 97.6 F (36.4 C)    TempSrc: Oral Oral    SpO2: 95% 95%    Weight:  45.8 kg (100 lb 15.5 oz)    Height:        Intake/Output Summary (Last 24 hours) at 11/17/15 1420 Last data filed at 11/17/15 1228  Gross per 24 hour  Intake              180 ml  Output                0 ml  Net              180 ml   Filed Weights   11/15/15 1852 11/15/15 2115 11/17/15 1300  Weight: 47 kg (103 lb 9.9 oz) 47.2 kg (104 lb 0.9 oz) 45.8 kg (100 lb 15.5 oz)    Examination:  General exam: Appears calm and comfortable  Respiratory system: Clear to auscultation. Respiratory effort normal. Cardiovascular system: S1 & S2 heard, RRR. No JVD, murmurs, rubs, gallops or clicks. No pedal edema.  Gastrointestinal system: Abdomen is nondistended, soft and nontender. No organomegaly or masses felt. Normal bowel sounds heard. Central nervous system: Alert and oriented. No focal neurological deficits. Extremities: Symmetric 5 x 5 power. Skin: No rashes, lesions or ulcers Psychiatry: Judgement and insight appear normal. Mood & affect appropriate.    Data Reviewed: I have personally reviewed following labs and imaging studies Basic Metabolic Panel:  Recent Labs Lab 11/15/15 1017 11/16/15 0343 11/17/15 1300  NA 136 134* 133*  K 3.6 3.9 4.7  CL 97* 100* 95*  CO2 30 28 29   GLUCOSE 115* 143* 81   BUN 9 14 20   CREATININE 2.52* 3.06* 4.10*  CALCIUM 9.8 9.5 9.9  PHOS  --   --  3.2   Liver Function Tests:  Recent Labs Lab 11/15/15 1017 11/17/15 1300  AST 25  --   ALT 14  --   ALKPHOS 76  --   BILITOT 0.6  --   PROT 5.9*  --   ALBUMIN 2.3* 2.2*   No results for input(s): LIPASE, AMYLASE in the last 168 hours. No results for input(s): AMMONIA in the last 168 hours. CBC:  Recent Labs Lab 11/15/15 1017 11/15/15 1918 11/16/15 0343 11/16/15 1157 11/17/15 1300  WBC 10.5 11.0* 13.1* 14.5* 15.3*  NEUTROABS 8.5*  --   --   --   --   HGB 8.9* 7.3* 6.5* 8.5* 8.1*  HCT 28.3* 23.1* 20.1* 25.9* 24.9*  MCV 93.1 93.9 92.6 91.8 91.2  PLT 202 153 163 142* 149*   Cardiac Enzymes: No results for input(s): CKTOTAL, CKMB, CKMBINDEX, TROPONINI in the last 168 hours. BNP (last 3 results) No results for input(s): BNP in the last 8760 hours.  ProBNP (last 3 results) No results for input(s): PROBNP in the last 8760 hours.  CBG:  Recent Labs Lab 11/15/15 1312 11/15/15 1717 11/15/15 1748  GLUCAP 71 58* 114*    Recent Results (from the past 240 hour(s))  MRSA PCR Screening     Status: None   Collection Time: 11/16/15  9:46 AM  Result Value Ref Range Status   MRSA by PCR NEGATIVE NEGATIVE Final    Comment:        The GeneXpert MRSA Assay (FDA approved for NASAL specimens only), is one component of a comprehensive MRSA colonization surveillance program. It is not intended to diagnose MRSA infection nor to guide or monitor treatment for MRSA infections.      Studies: Dg Chest Port 1 View  Result Date: 11/15/2015 CLINICAL DATA:  Status post dialysis catheter insertion through the right groin. EXAM: PORTABLE CHEST 1 VIEW COMPARISON:  12/27/2013 FINDINGS: Large bore catheter is seen from inferior approach with tip overlying the expected location of the proximal right atrium. Vascular stent in the region of the left brachiocephalic vein is stable. Cardiomediastinal  silhouette is normal. Mediastinal contours appear intact. There is no evidence of focal airspace consolidation, pleural effusion or pneumothorax. Osseous structures are without acute abnormality. Soft tissues are grossly normal. IMPRESSION: No active disease. Large bore catheter from the inferior approach terminates in the expected location of proximal right atrium. Electronically Signed   By: Fidela Salisbury M.D.   On: 11/15/2015 16:52   Dg Abd Portable 1v  Result Date: 11/15/2015 CLINICAL DATA:  Change in bowel habits EXAM: PORTABLE ABDOMEN - 1 VIEW COMPARISON:  03/24/2012 FINDINGS: There is moderate gaseous distension of the lower abdominal and pelvic bowel loops. Average volume of stool noted within the colon. No dilated loops of small  bowel identified. Right groin dialysis catheter is identified. The tip is within the cavoatrial junction. IMPRESSION: 1. Nonobstructive bowel gas pattern. 2. The dialysis catheter tip is in the cavoatrial junction Electronically Signed   By: Kerby Moors M.D.   On: 11/15/2015 16:40   Dg Foot Complete Left  Result Date: 11/16/2015 CLINICAL DATA:  Osteomyelitis.  Left foot pain. EXAM: LEFT FOOT - COMPLETE 3+ VIEW COMPARISON:  None. FINDINGS: There is irregularity of the distal left first toe soft tissues. There is absence of the distal portion of the distal phalanx in the left first toe with associated erosive change at the distal osseous margin. There are multiple cortical erosions in the middle and proximal phalanges of the left fourth toe, with potential postsurgical changes in the proximal phalanx of the left fourth toe. Diffuse osteopenia. No fracture or dislocation. No suspicious focal osseous lesion. Diffuse vascular calcifications. No appreciable soft tissue gas. No periosteal reaction. IMPRESSION: 1. Absence of the distal portion of the distal phalanx in the left first toe with associated erosive change at the distal osseous margin and overlying soft tissue  irregularity, suggesting acute osteomyelitis, potentially at the site of prior amputation, correlate with surgical history. 2. Multiple cortical erosions in the left fourth toe suggestive of acute osteomyelitis with potential postsurgical changes in the proximal phalanx of the left fourth toe, correlate with surgical history. 3. Diffuse osteopenia. Electronically Signed   By: Ilona Sorrel M.D.   On: 11/16/2015 18:00   Dg Foot Complete Right  Result Date: 11/16/2015 CLINICAL DATA:  Osteomyelitis EXAM: RIGHT FOOT COMPLETE - 3+ VIEW COMPARISON:  None. FINDINGS: Three views of the right foot submitted. There is diffuse osteopenia. The patient is status post amputation of first and second toes. There is diffuse osteopenia. No definite evidence of bone destruction to suggest osteomyelitis. Extensive atherosclerotic vascular calcifications are noted. No acute fracture or subluxation IMPRESSION: No acute fracture or subluxation. Diffuse osteopenia. Status post amputation of first and second toes. No definite evidence of bone destruction to suggest osteomyelitis. Electronically Signed   By: Lahoma Crocker M.D.   On: 11/16/2015 17:51   Dg Fluoro Guide Cv Line-no Report  Result Date: 11/15/2015 CLINICAL DATA:  FLOURO GUIDE CV LINE Fluoroscopy was utilized by the requesting physician.  No radiographic interpretation.    Scheduled Meds: . sodium chloride   Intravenous Once  . sodium chloride   Intravenous Once  . anastrozole  1 mg Oral QPC supper  . calcitRIOL  0.5 mcg Oral Q M,W,F-HD  . cinacalcet  60 mg Oral Q supper  . darbepoetin (ARANESP) injection - DIALYSIS  100 mcg Intravenous Q Mon-HD  . feeding supplement (NEPRO CARB STEADY)  237 mL Oral BID BM  . lanthanum  1,000 mg Oral Q supper  . metoprolol succinate  50 mg Oral QPC supper  . multivitamin  1 tablet Oral QHS  . piperacillin-tazobactam (ZOSYN)  IV  3.375 g Intravenous Q12H  . sodium chloride flush  3 mL Intravenous Q12H  . [START ON 11/19/2015]  vancomycin  500 mg Intravenous Q M,W,F-HD  . vancomycin  1,000 mg Intravenous Q M,W,F-HD   Continuous Infusions:      Time spent: 25 min    [facility] Pager [phone]. If 7PM-7AM, please contact night-coverage at [email or web address], Office  [phone]  password [email or web address] 11/17/2015, 2:20 PM  LOS: 1 day

## 2015-11-17 NOTE — Progress Notes (Addendum)
  Postoperative hemodialysis access     Date of Surgery:  11/15/15 Surgeon: Zophia Marrone  Subjective:  C/o discomfort in her right groin  PHYSICAL EXAMINATION:  Vitals:   11/17/15 0458 11/17/15 1109  BP: (!) 143/43 (!) 146/42  Pulse: (!) 131 61  Resp: 16 18  Temp: 97.5 F (36.4 C) 97.9 F (36.6 C)    Sensation in digits is intact;  There is  Thrill  The graft/fistula is palpable    ASSESSMENT/PLAN:  [name] is a 82 y.o. year old female who is s/p new left upper arm AV graft and placement of right femoral HD catheter.  -graft/fistula is patent -pt does not have evidence of steal sx -pt with right heel wound-pt had arteriogram last year by Dr. Fletcher Anon, which revealed she has nonreconstructable disease.  She is non-ambulatory and needs help in transfers to wheelchair. She may need amputation in the future if intervention is needed. -will sign off-call as needed.  -use TDC for 3-4 weeks then may attempt to use graft.  Once graft has been used 2-3 x successfully, can remove tunneled dialysis catheter.   Leontine Locket, PA-C [facility] [phone]  I have examined the patient, reviewed and agree with above.No complication from her left arm access. She does have dry gangrenous changes on both feet. Severe contractures in both knees. Has had multiple toe amputations. Underwent lower extremity arteriogram in May 2016 showing non-reconstructable disease. Her only option would be amputation if she has either intolerable pain which is currently not case or progressive tissue loss.  Curt Jews, MD 11/17/2015 2:00 PM

## 2015-11-18 LAB — GLUCOSE, CAPILLARY
GLUCOSE-CAPILLARY: 33 mg/dL — AB (ref 65–99)
GLUCOSE-CAPILLARY: 40 mg/dL — AB (ref 65–99)
GLUCOSE-CAPILLARY: 38 mg/dL — AB (ref 65–99)
Glucose-Capillary: 27 mg/dL — CL (ref 65–99)
Glucose-Capillary: 89 mg/dL (ref 65–99)

## 2015-11-18 LAB — GLUCOSE, RANDOM: GLUCOSE: 83 mg/dL (ref 65–99)

## 2015-11-18 MED ORDER — INSULIN ASPART 100 UNIT/ML ~~LOC~~ SOLN: 0.0000 [IU] | Freq: Three times a day (TID) | SUBCUTANEOUS | Status: DC

## 2015-11-18 NOTE — Consult Note (Signed)
ORTHOPAEDIC CONSULTATION  REQUESTING PHYSICIAN: Oswald Hillock, MD  Chief Complaint: Painful dry gangrene both feet  HPI: [name] is a 81 y.o. female who presents with diabetic insensate neuropathy end-stage renal disease on dialysis with severe peripheral vascular disease with chronic gangrene of both feet.  Past Medical History:  Diagnosis Date  . Anemia   . Arthritis   . Bilateral breast cancer ([place])   . DM (diabetes mellitus) ([place])   . Dyslipidemia   . ESRD (end stage renal disease) on dialysis ([place])    a. [place]: MWF (07/10/2014)  . Hepatitis C   . Hyperparathyroidism   . Hypertension   . PVD (peripheral vascular disease) ([place])    a. s/p peripheral angiogram on 07/10/14 with no PCI and cont med Rx  . Vascular disease    Past Surgical History:  Procedure Laterality Date  . ABDOMINAL HYSTERECTOMY    . APPENDECTOMY    . ARTERIOVENOUS GRAFT PLACEMENT Left 03/20/09   "had right arm previously but couldn't use it anymore"  . BACK SURGERY    . BREAST BIOPSY Bilateral   . BREAST BIOPSY Right 08/08/2013   Procedure: Evacuation Hematoma Right Chest;  Surgeon: [name], MD;  Location: [place];  Service: General;  Laterality: Right;  . CATARACT EXTRACTION Right   . DG AV DIALYSIS GRAFT DECLOT OR Left 01/27/11, 02/15/11, 03/15/11, 10/13/11   lua  . EVACUATION BREAST HEMATOMA  2001; 2015   left; right  . INSERTION OF DIALYSIS CATHETER Right 11/15/2015   Procedure: INSERTION OF Right Femoral  DIALYSIS CATHETER.;  Surgeon: Rosetta Posner, MD;  Location: [place];  Service: Vascular;  Laterality: Right;  . MASTECTOMY Left 1970's?  Marland Kitchen MASTECTOMY COMPLETE / SIMPLE Right 08/07/2013  . MEDIAN NERVE REPAIR Left    "decompression"  . PERIPHERAL VASCULAR CATHETERIZATION N/A 07/10/2014   Procedure: Abdominal Aortogram;  Surgeon: Wellington Hampshire, MD;  Location: [facility] [facility]IVE [facility];  Service: Cardiovascular;  Laterality: N/A;  . POSTERIOR FUSION CERVICAL SPINE     "have 6 screws"  .  THROMBECTOMY AND REVISION OF ARTERIOVENTOUS (AV) GORETEX  GRAFT Left 04/18/2009  . THROMBECTOMY AND REVISION OF ARTERIOVENTOUS (AV) GORETEX  GRAFT Left 11/15/2015   Procedure: Thrombectomy and Revision of Left ARm AV Gortex Graft.;  Surgeon: Rosetta Posner, MD;  Location: [place];  Service: Vascular;  Laterality: Left;  . TOE AMPUTATION Right    1,2nd toes  . TONSILLECTOMY    . TOTAL MASTECTOMY Right 08/07/2013   Procedure: RIGHT TOTAL MASTECTOMY;  Surgeon: [name], MD;  Location: [place] [facility];  Service: General;  Laterality: Right;  . UTERINE FIBROID SURGERY     Social History   Social History  . Marital status: Widowed    Spouse name: N/A  . Number of children: N/A  . Years of education: N/A   Social History Main Topics  . Smoking status: Former Smoker    Types: Cigarettes  . Smokeless tobacco: Never Used     Comment: "stopped smoking in 1969"  . Alcohol use 0.0 oz/week     Comment: "no alcohol since 1969"  . Drug use: No  . Sexual activity: No   Other Topics Concern  . None   Social History Narrative  . None   Family History  Problem Relation Age of Onset  . Cancer Mother   . Hypertension Mother    - negative except otherwise stated in the family history section Allergies  Allergen Reactions  . Aspirin Other (See Comments)    NO BLOOD THINNERS OF ANY KIND-bleeding events   Prior to Admission medications   Medication Sig Start Date End Date Taking? Authorizing Provider  anastrozole (ARIMIDEX) 1 MG tablet Take 1 tablet (1 mg total) by mouth daily. Patient taking differently: Take 1 mg by mouth daily after supper.  06/17/15  Yes Wyatt Portela, MD  B Complex-C-Folic Acid (DIALYVITE TABLET) TABS Take 1 tablet by mouth daily after supper. 10/31/15  Yes Historical Provider, MD  cinacalcet (SENSIPAR) 60 MG tablet Take 60 mg by mouth daily after supper.   Yes Historical Provider, MD  ibuprofen (ADVIL,MOTRIN) 200 MG tablet Take 400 mg by mouth every 6 (six) hours as needed for  fever, headache or mild pain.    Yes Historical Provider, MD  lanthanum (FOSRENOL) 1000 MG chewable tablet Chew 1,000 mg by mouth daily after supper.    Yes Historical Provider, MD  metoprolol succinate (TOPROL-XL) 50 MG 24 hr tablet Take 50 mg by mouth daily after supper. Take with or immediately following a meal.    Yes Historical Provider, MD  traMADol (ULTRAM) 50 MG tablet Take 1 tablet (50 mg total) by mouth every 12 (twelve) hours as needed for moderate pain. 10/27/15  Yes Davonna Belling, MD   Dg Foot Complete Left  Result Date: 11/16/2015 CLINICAL DATA:  Osteomyelitis.  Left foot pain. EXAM: LEFT FOOT - COMPLETE 3+ VIEW COMPARISON:  None. FINDINGS: There is irregularity of the distal left first toe soft tissues. There is absence of the distal portion of the distal phalanx in the left first toe with associated erosive change at the distal osseous margin. There are multiple cortical erosions in the middle and proximal phalanges of the left fourth toe, with potential postsurgical changes in the proximal phalanx of the left fourth toe. Diffuse osteopenia. No fracture or dislocation. No suspicious focal osseous lesion. Diffuse vascular calcifications. No appreciable soft tissue gas. No periosteal reaction. IMPRESSION: 1. Absence of the distal portion of the distal phalanx in the left first toe with associated erosive change at the distal osseous margin and overlying soft tissue irregularity, suggesting acute osteomyelitis, potentially at the site of prior amputation, correlate with surgical history. 2. Multiple cortical erosions in the left fourth toe suggestive of acute osteomyelitis with potential postsurgical changes in the proximal phalanx of the left fourth toe, correlate with surgical history. 3. Diffuse osteopenia. Electronically Signed   By: Ilona Sorrel M.D.   On: 11/16/2015 18:00   Dg Foot Complete Right  Result Date: 11/16/2015 CLINICAL DATA:  Osteomyelitis EXAM: RIGHT FOOT COMPLETE - 3+  VIEW COMPARISON:  None. FINDINGS: Three views of the right foot submitted. There is diffuse osteopenia. The patient is status post amputation of first and second toes. There is diffuse osteopenia. No definite evidence of bone destruction to suggest osteomyelitis. Extensive atherosclerotic vascular calcifications are noted. No acute fracture or subluxation IMPRESSION: No acute fracture or subluxation. Diffuse osteopenia. Status post amputation of first and second toes. No definite evidence of bone destruction to suggest osteomyelitis. Electronically Signed   By: Lahoma Crocker M.D.   On: 11/16/2015 17:51   - pertinent xrays, CT, MRI studies were reviewed and independently interpreted  Positive ROS: All other systems have been reviewed and were otherwise negative with the exception of those mentioned in the HPI and as above.  Physical Exam: General: Alert, no acute distress Psychiatric: Patient is competent for consent with normal mood and affect Lymphatic: No  axillary or cervical lymphadenopathy Cardiovascular: No pedal edema Respiratory: No cyanosis, no use of accessory musculature GI: No organomegaly, abdomen is soft and non-tender  Skin: Patient's feet are cold to the touch she does not have a palpable dorsalis pedis pulse or feet are cold her toes are mummified. She has ischemic changes to the hindfoot and forefoot.   Neurologic: Patient does not have protective sensation bilateral lower extremities.   MUSCULOSKELETAL:  Examination patient's both lower extremities she has severe peripheral vascular disease with contractures of her knees she is nonambulatory she has cold lower extremities and arteriogram studies recently have shown no revascularization options.  Assessment: Assessment: Diabetic insensate neuropathy end-stage renal disease with severe peripheral vascular disease with dry gangrene of both feet which is currently not painful.  Plan: Plan: Discussed with the patient that her  only option would be an above-the-knee amputation and this may not heal. There would be no benefit of preserving her knees with knee flexion contractures with the patient's inability to ambulate. Patient is strongly opposed amputation at this time I will sign off, if patient does develop infection or develops increased pain would need to reevaluate for bilateral above-the-knee amputations.  Thank you for the consult and the opportunity to see Ms. Marlou Sa, [place] [phone] 7:04 AM

## 2015-11-18 NOTE — Progress Notes (Signed)
[facility]  PROGRESS NOTE  NACOLE FULMORE [id number] DOB: [birth year] DOA: 11/15/2015 PCP: [name], MD    Brief HPI:   82 y.o. female with a medical history significant for, but not  limited to, hypertension, diabetes, PAD, and end-stage renal disease on hemodialysis. Brought to ED by EMS this a.m. for evaluation of bleeding from her dialysis graft in left arm. Patient was able to complete full dialysis yesterday Seen in the ED a week and half ago for similar bleeding. At which time she also complained of right lower extremity pain. Doppler study negative for DVT. She has a follow-up appointment with vascular surgery in the next few days.    Assessment/Plan:    1. Bleeding from left AV graft- patient underwent thrombectomy and revision of AV graft, also had placement of right femoral dialysis catheter. Can use her left arm graft in 3 weeks as per vascular surgery. 2. Anemia due to blood loss- patient's hemoglobin dropped to 6.5, status post 1 unit PRBC. Repeat Hemoglobin is 8.1 3. Osteomyelitis of left first and fourth toe- consulted orthopedics Dr. Sharol Given, patient initially refused surgery, but now agrees for surgery. Patient's healthcare power of attorney to speak to Dr. Sharol Given. Patient was also seen by vascular surgery, patient not a candidate for revascularization therapy. Started on vancomycin and Zosyn for pharmacy consultation. 4. ESRD- patient on hemodialysis Monday Wednesday Friday, nephrology following. 5. Constipation- KUB shows nonobstructive bowel gas pattern.  6. History of breast cancer- status post total mastectomy for right-sided invasive ductal carcinoma in April 2015. Also has remote history of left breast cancer. Continue Arimidex 7. Hypertension- blood pressures is well  controlled, continue Toprol-XL. 8. Diabetes mellitus- started sliding scale insulin, CBG persistently showing less than 40, patient completely asymptomatic. Checked serum glucose which was  83. Will discontinue CBG every before meals and at bedtime and obtain serum glucose twice a day.   DVT prophylaxis: SCDs Code Status: Full code Family Communication: No family at bedside  Disposition Plan: Home when hemoglobin stable   Consultants:  Vascular surgery  Procedures:  Thrombectomy and revision of AV graft , insertion of right femoral dialysis catheter  Antibiotics:  None  Subjective   Patient seen and examined, admitted with bleeding from AV graft. Patient was seen by vascular surgery underwent thrombectomy and underwent  revision of left AV graft.Xray of feet showed osteomyelitis of left first and fourth toe.Patient continues to complain of pain in the foot  Objective    Objective: Vitals:   11/17/15 1754 11/17/15 2110 11/18/15 0543 11/18/15 1017  BP: (!) 99/58 (!) 119/35 (!) 127/56 (!) 123/52  Pulse: (!) 59 64 62 (!) 55  Resp: 16 18 19 18   Temp: 98.1 F (36.7 C) 97.6 F (36.4 C) 97.7 F (36.5 C) 97.8 F (36.6 C)  TempSrc: Oral   Oral  SpO2: 100% 100% 93% 96%  Weight:      Height:        Intake/Output Summary (Last 24 hours) at 11/18/15 1634 Last data filed at 11/18/15 1557  Gross per 24 hour  Intake              950 ml  Output                0 ml  Net              950 ml   Filed Weights   11/15/15 2115 11/17/15 1300 11/17/15 1711  Weight: 47.2 kg (104 lb 0.9 oz) 45.8  kg (100 lb 15.5 oz) 45.8 kg (100 lb 15.5 oz)    Examination:  General exam: Appears calm and comfortable  Respiratory system: Clear to auscultation. Respiratory effort normal. Cardiovascular system: S1 & S2 heard, RRR. No JVD, murmurs, rubs, gallops or clicks. No pedal edema. Gastrointestinal system: Abdomen is nondistended, soft and nontender. No organomegaly or masses felt. Normal bowel sounds heard. Central nervous system: Alert and oriented. No focal neurological deficits. Extremities: Symmetric 5 x 5 power. Skin: No rashes, lesions or ulcers Psychiatry: Judgement and  insight appear normal. Mood & affect appropriate.    Data Reviewed: I have personally reviewed following labs and imaging studies Basic Metabolic Panel:  Recent Labs Lab 11/15/15 1017 11/16/15 0343 11/17/15 1300 11/18/15 1542  NA 136 134* 133*  --   K 3.6 3.9 4.7  --   CL 97* 100* 95*  --   CO2 30 28 29   --   GLUCOSE 115* 143* 81 83  BUN 9 14 20   --   CREATININE 2.52* 3.06* 4.10*  --   CALCIUM 9.8 9.5 9.9  --   PHOS  --   --  3.2  --    Liver Function Tests:  Recent Labs Lab 11/15/15 1017 11/17/15 1300  AST 25  --   ALT 14  --   ALKPHOS 76  --   BILITOT 0.6  --   PROT 5.9*  --   ALBUMIN 2.3* 2.2*   No results for input(s): LIPASE, AMYLASE in the last 168 hours. No results for input(s): AMMONIA in the last 168 hours. CBC:  Recent Labs Lab 11/15/15 1017 11/15/15 1918 11/16/15 0343 11/16/15 1157 11/17/15 1300  WBC 10.5 11.0* 13.1* 14.5* 15.3*  NEUTROABS 8.5*  --   --   --   --   HGB 8.9* 7.3* 6.5* 8.5* 8.1*  HCT 28.3* 23.1* 20.1* 25.9* 24.9*  MCV 93.1 93.9 92.6 91.8 91.2  PLT 202 153 163 142* 149*   Cardiac Enzymes: No results for input(s): CKTOTAL, CKMB, CKMBINDEX, TROPONINI in the last 168 hours. BNP (last 3 results) No results for input(s): BNP in the last 8760 hours.  ProBNP (last 3 results) No results for input(s): PROBNP in the last 8760 hours.  CBG:  Recent Labs Lab 11/15/15 1748 11/18/15 1130 11/18/15 1154 11/18/15 1217 11/18/15 1556  GLUCAP 114* 40* 27* 33* 38*    Recent Results (from the past 240 hour(s))  MRSA PCR Screening     Status: None   Collection Time: 11/16/15  9:46 AM  Result Value Ref Range Status   MRSA by PCR NEGATIVE NEGATIVE Final    Comment:        The GeneXpert MRSA Assay (FDA approved for NASAL specimens only), is one component of a comprehensive MRSA colonization surveillance program. It is not intended to diagnose MRSA infection nor to guide or monitor treatment for MRSA infections.      Studies: Dg  Foot Complete Left  Result Date: 11/16/2015 CLINICAL DATA:  Osteomyelitis.  Left foot pain. EXAM: LEFT FOOT - COMPLETE 3+ VIEW COMPARISON:  None. FINDINGS: There is irregularity of the distal left first toe soft tissues. There is absence of the distal portion of the distal phalanx in the left first toe with associated erosive change at the distal osseous margin. There are multiple cortical erosions in the middle and proximal phalanges of the left fourth toe, with potential postsurgical changes in the proximal phalanx of the left fourth toe. Diffuse osteopenia. No fracture or dislocation.  No suspicious focal osseous lesion. Diffuse vascular calcifications. No appreciable soft tissue gas. No periosteal reaction. IMPRESSION: 1. Absence of the distal portion of the distal phalanx in the left first toe with associated erosive change at the distal osseous margin and overlying soft tissue irregularity, suggesting acute osteomyelitis, potentially at the site of prior amputation, correlate with surgical history. 2. Multiple cortical erosions in the left fourth toe suggestive of acute osteomyelitis with potential postsurgical changes in the proximal phalanx of the left fourth toe, correlate with surgical history. 3. Diffuse osteopenia. Electronically Signed   By: Ilona Sorrel M.D.   On: 11/16/2015 18:00   Dg Foot Complete Right  Result Date: 11/16/2015 CLINICAL DATA:  Osteomyelitis EXAM: RIGHT FOOT COMPLETE - 3+ VIEW COMPARISON:  None. FINDINGS: Three views of the right foot submitted. There is diffuse osteopenia. The patient is status post amputation of first and second toes. There is diffuse osteopenia. No definite evidence of bone destruction to suggest osteomyelitis. Extensive atherosclerotic vascular calcifications are noted. No acute fracture or subluxation IMPRESSION: No acute fracture or subluxation. Diffuse osteopenia. Status post amputation of first and second toes. No definite evidence of bone destruction to  suggest osteomyelitis. Electronically Signed   By: Lahoma Crocker M.D.   On: 11/16/2015 17:51    Scheduled Meds: . sodium chloride   Intravenous Once  . sodium chloride   Intravenous Once  . anastrozole  1 mg Oral QPC supper  . cinacalcet  60 mg Oral Q supper  . darbepoetin (ARANESP) injection - DIALYSIS  100 mcg Intravenous Q Mon-HD  . feeding supplement (NEPRO CARB STEADY)  237 mL Oral BID BM  . insulin aspart  0-9 Units Subcutaneous TID WC  . lanthanum  1,000 mg Oral Q supper  . metoprolol succinate  50 mg Oral QPC supper  . multivitamin  1 tablet Oral QHS  . piperacillin-tazobactam (ZOSYN)  IV  3.375 g Intravenous Q12H  . sodium chloride flush  3 mL Intravenous Q12H  . [START ON 11/19/2015] vancomycin  500 mg Intravenous Q M,W,F-HD   Continuous Infusions:      Time spent: 25 min    [facility] Pager [phone]. If 7PM-7AM, please contact night-coverage at [email or web address], Office  [phone]  password [email or web address] 11/18/2015, 4:34 PM  LOS: 2 days

## 2015-11-18 NOTE — Progress Notes (Signed)
[name] Progress Note   Subjective: "I don't have no pain anywhere in my body except that R. Foot". Being fed b'fast, appetite good. No pain at present.      Objective Vitals:   11/17/15 1711 11/17/15 1754 11/17/15 2110 11/18/15 0543  BP: (!) 130/44 (!) 99/58 (!) 119/35 (!) 127/56  Pulse: 67 (!) 59 64 62  Resp: 10 16 18 19   Temp: 97.8 F (36.6 C) 98.1 F (36.7 C) 97.6 F (36.4 C) 97.7 F (36.5 C)  TempSrc: Oral Oral    SpO2: 100% 100% 100% 93%  Weight: 45.8 kg (100 lb 15.5 oz)     Height:       Physical Exam General: [name] Heart: S1,S2, RRR  Lungs: BBS slight dec in bases, CTA.No WOB.  Abdomen: soft, non-tender Extremities: R. Heel ulcer, demarcation on sole of R foot extending from mid foot to toes. Appears to have foul smelling drainage between 4 & 5th toes R foot. No pedal pulses. No LE edema. Dialysis Access: R fem TDC drsg intact. Drsg intact LUA.   Dialysis Orders: [facility] M[facility] 3.75 hours EDW 47 - 3K 2.5 Ca heparin 1700 calcitriol 0.5 profile 4 - NEW right fem cath and left upper AVGG 9/9 Early Last Mircera 75 on 7/19 Recent labs: hgb 12 8/23 w 42% sat down to 10 9/6 - iPTH 324  Additional Objective Labs: Basic Metabolic Panel:  Recent Labs Lab 11/15/15 1017 11/16/15 0343 11/17/15 1300  NA 136 134* 133*  K 3.6 3.9 4.7  CL 97* 100* 95*  CO2 30 28 29   GLUCOSE 115* 143* 81  BUN 9 14 20   CREATININE 2.52* 3.06* 4.10*  CALCIUM 9.8 9.5 9.9  PHOS  --   --  3.2   Liver Function Tests:  Recent Labs Lab 11/15/15 1017 11/17/15 1300  AST 25  --   ALT 14  --   ALKPHOS 76  --   BILITOT 0.6  --   PROT 5.9*  --   ALBUMIN 2.3* 2.2*   No results for input(s): LIPASE, AMYLASE in the last 168 hours. CBC:  Recent Labs Lab 11/15/15 1017 11/15/15 1918 11/16/15 0343 11/16/15 1157 11/17/15 1300  WBC 10.5 11.0* 13.1* 14.5* 15.3*  NEUTROABS 8.5*  --   --   --   --   HGB 8.9* 7.3* 6.5* 8.5* 8.1*  HCT 28.3* 23.1* 20.1* 25.9*  24.9*  MCV 93.1 93.9 92.6 91.8 91.2  PLT 202 153 163 142* 149*   Blood Culture No results found for: SDES, SPECREQUEST, CULT, REPTSTATUS  Cardiac Enzymes: No results for input(s): CKTOTAL, CKMB, CKMBINDEX, TROPONINI in the last 168 hours. CBG:  Recent Labs Lab 11/15/15 1312 11/15/15 1717 11/15/15 1748  GLUCAP 71 58* 114*   Iron Studies: No results for input(s): IRON, TIBC, TRANSFERRIN, FERRITIN in the last 72 hours. @lablastinr3 @ Studies/Results: Dg Foot Complete Left  Result Date: 11/16/2015 CLINICAL DATA:  Osteomyelitis.  Left foot pain. EXAM: LEFT FOOT - COMPLETE 3+ VIEW COMPARISON:  None. FINDINGS: There is irregularity of the distal left first toe soft tissues. There is absence of the distal portion of the distal phalanx in the left first toe with associated erosive change at the distal osseous margin. There are multiple cortical erosions in the middle and proximal phalanges of the left fourth toe, with potential postsurgical changes in the proximal phalanx of the left fourth toe. Diffuse osteopenia. No fracture or dislocation. No suspicious focal osseous lesion. Diffuse vascular calcifications.  No appreciable soft tissue gas. No periosteal reaction. IMPRESSION: 1. Absence of the distal portion of the distal phalanx in the left first toe with associated erosive change at the distal osseous margin and overlying soft tissue irregularity, suggesting acute osteomyelitis, potentially at the site of prior amputation, correlate with surgical history. 2. Multiple cortical erosions in the left fourth toe suggestive of acute osteomyelitis with potential postsurgical changes in the proximal phalanx of the left fourth toe, correlate with surgical history. 3. Diffuse osteopenia. Electronically Signed   By: Ilona Sorrel M.D.   On: 11/16/2015 18:00   Dg Foot Complete Right  Result Date: 11/16/2015 CLINICAL DATA:  Osteomyelitis EXAM: RIGHT FOOT COMPLETE - 3+ VIEW COMPARISON:  None. FINDINGS: Three  views of the right foot submitted. There is diffuse osteopenia. The patient is status post amputation of first and second toes. There is diffuse osteopenia. No definite evidence of bone destruction to suggest osteomyelitis. Extensive atherosclerotic vascular calcifications are noted. No acute fracture or subluxation IMPRESSION: No acute fracture or subluxation. Diffuse osteopenia. Status post amputation of first and second toes. No definite evidence of bone destruction to suggest osteomyelitis. Electronically Signed   By: Lahoma Crocker M.D.   On: 11/16/2015 17:51   Medications:   . sodium chloride   Intravenous Once  . sodium chloride   Intravenous Once  . anastrozole  1 mg Oral QPC supper  . calcitRIOL  0.5 mcg Oral Q M,W,F-HD  . cinacalcet  60 mg Oral Q supper  . darbepoetin (ARANESP) injection - DIALYSIS  100 mcg Intravenous Q Mon-HD  . feeding supplement (NEPRO CARB STEADY)  237 mL Oral BID BM  . lanthanum  1,000 mg Oral Q supper  . metoprolol succinate  50 mg Oral QPC supper  . multivitamin  1 tablet Oral QHS  . piperacillin-tazobactam (ZOSYN)  IV  3.375 g Intravenous Q12H  . sodium chloride flush  3 mL Intravenous Q12H  . [START ON 11/19/2015] vancomycin  500 mg Intravenous Q M,W,F-HD     Assessment/Plan:  1. s/p placement of new right tunneld fem cath and new left upper AVGG - due to recurrent bleeding with acute blood loss from old left upper AVGG 9/9- new R fem TDC per [facility].  2. ESRD -  MWF - Next treatment tomorrow.  3. Hypertension/volume  - BP controlled. HD 11/17/15. Pre wt 45.8 kg Net UF 500 post wt 45.8 kg.  Lower EDW on DC. HD tomorrow 0.5-1 liters.  4. Anemia  - HGB 8.1 11/17/15.  start weekly venofer 50 at discharge.  Also started ESA 5. Metabolic bone disease -  Ca 9.9 C Ca 11.34 Phos 3.2. Hold Calcitriol. Change to 2.0 Ca bath. cont sensipar/fosrenol.  6. Nutrition - poor- added vitamins and nepro - doesn't like to eat breakfast 7. PAD with acute left 1st and 4th toe osteo  per xrays- chronic right heel ulcer and necrotic toes bilaterally- previously being followed by podiatry with conservative treatment- defer management to [facility]- has mild leukocytosis.  Dr. Sharol Given has been consulted. He recommended amputation which pt refused. Ortho has now signed off. Pt now saying she will do amputation.  She had me call the POA who is going to be by later today to speak with pt when final decision will be made.  Pt is NOT considering sign off of HD 8. Hx breast cancer - s/p right mastectomy on arimidex 9. Disp - ultimately to return home - has no children - has two caregivers that share  her care but no one actually spends the night - they tuck her into bed with her phone and come back in the am early enough on dialysis days to get her ready for dialysis.  I have told her that if she does not get amp she will continue to have problems, she is now considering amp   Rita H. Brown NP-C 11/18/2015, 9:53 AM  [name] [phone]  Patient seen and examined, agree with above note with above modifications. Major issue is ischemic leg- Duda rec amputation above knwee- pt initially refusing but now considering.  Will plan on routine HD tomorrow [name], MD 11/18/2015

## 2015-11-19 DIAGNOSIS — Z992 Dependence on renal dialysis: Secondary | ICD-10-CM

## 2015-11-19 DIAGNOSIS — N186 End stage renal disease: Secondary | ICD-10-CM

## 2015-11-19 DIAGNOSIS — T82838D Hemorrhage of vascular prosthetic devices, implants and grafts, subsequent encounter: Secondary | ICD-10-CM

## 2015-11-19 DIAGNOSIS — M86672 Other chronic osteomyelitis, left ankle and foot: Secondary | ICD-10-CM

## 2015-11-19 LAB — CBC
HCT: 23.4 % — ABNORMAL LOW (ref 36.0–46.0)
Hemoglobin: 7.6 g/dL — ABNORMAL LOW (ref 12.0–15.0)
MCH: 30.2 pg (ref 26.0–34.0)
MCHC: 32.5 g/dL (ref 30.0–36.0)
MCV: 92.9 fL (ref 78.0–100.0)
Platelets: 123 10*3/uL — ABNORMAL LOW (ref 150–400)
RBC: 2.52 MIL/uL — ABNORMAL LOW (ref 3.87–5.11)
RDW: 14.6 % (ref 11.5–15.5)
WBC: 14.4 K/uL — ABNORMAL HIGH (ref 4.0–10.5)

## 2015-11-19 LAB — RENAL FUNCTION PANEL
Albumin: 1.9 g/dL — ABNORMAL LOW (ref 3.5–5.0)
Anion gap: 7 (ref 5–15)
BUN: 14 mg/dL (ref 6–20)
CO2: 30 mmol/L (ref 22–32)
Calcium: 9.6 mg/dL (ref 8.9–10.3)
Chloride: 95 mmol/L — ABNORMAL LOW (ref 101–111)
Creatinine, Ser: 2.87 mg/dL — ABNORMAL HIGH (ref 0.44–1.00)
GFR calc Af Amer: 17 mL/min — ABNORMAL LOW (ref >60)
GFR calc non Af Amer: 14 mL/min — ABNORMAL LOW (ref >60)
Glucose, Bld: 67 mg/dL (ref 65–99)
Phosphorus: 2.4 mg/dL — ABNORMAL LOW (ref 2.5–4.6)
Potassium: 3.6 mmol/L (ref 3.5–5.1)
Sodium: 132 mmol/L — ABNORMAL LOW (ref 135–145)

## 2015-11-19 LAB — PREPARE RBC (CROSSMATCH)

## 2015-11-19 LAB — GLUCOSE, RANDOM
Glucose, Bld: 68 mg/dL (ref 65–99)
Glucose, Bld: 67 mg/dL (ref 65–99)

## 2015-11-19 LAB — HEMOGLOBIN A1C
Hgb A1c MFr Bld: 5.2 % (ref 4.8–5.6)
Mean Plasma Glucose: 103 mg/dL

## 2015-11-19 MED ORDER — SODIUM CHLORIDE 0.9 % IV SOLN: Freq: Once | INTRAVENOUS | Status: DC

## 2015-11-19 MED ORDER — SODIUM CHLORIDE 0.9 % IV SOLN: 100.0000 mL | INTRAVENOUS | Status: DC | PRN

## 2015-11-19 MED ORDER — ALTEPLASE 2 MG IJ SOLR: 2.0000 mg | Freq: Once | INTRAMUSCULAR | Status: DC | PRN

## 2015-11-19 MED ORDER — OXYCODONE-ACETAMINOPHEN 5-325 MG PO TABS
ORAL_TABLET | ORAL | Status: AC
  Filled 2015-11-19: qty 1

## 2015-11-19 MED ORDER — LIDOCAINE HCL (PF) 1 % IJ SOLN: 5.0000 mL | INTRAMUSCULAR | Status: DC | PRN

## 2015-11-19 MED ORDER — HEPARIN SODIUM (PORCINE) 1000 UNIT/ML DIALYSIS: 1000.0000 [IU] | INTRAMUSCULAR | Status: DC | PRN

## 2015-11-19 MED ORDER — LIDOCAINE-PRILOCAINE 2.5-2.5 % EX CREA: 1.0000 "application " | TOPICAL_CREAM | CUTANEOUS | Status: DC | PRN

## 2015-11-19 MED ORDER — OXYCODONE-ACETAMINOPHEN 5-325 MG PO TABS
1.0000 | ORAL_TABLET | Freq: Once | ORAL | Status: AC
  Administered 2015-11-19: 1 via ORAL

## 2015-11-19 MED ORDER — PENTAFLUOROPROP-TETRAFLUOROETH EX AERO: 1.0000 "application " | INHALATION_SPRAY | CUTANEOUS | Status: DC | PRN

## 2015-11-19 MED ORDER — HEPARIN SODIUM (PORCINE) 1000 UNIT/ML DIALYSIS: 1700.0000 [IU] | Freq: Once | INTRAMUSCULAR | Status: DC

## 2015-11-19 NOTE — Progress Notes (Signed)
PROGRESS NOTE                                                                                                                                                                                                             Patient Demographics:    [name], is a 82 y.o. female, DOB - 04-27-1932, [id number]  Admit date - 11/15/2015   Admitting Physician Kinnie Feil, MD  Outpatient Primary MD for the patient is [name], MD  LOS - 3   Chief Complaint  Patient presents with  . graft bleeding       Brief Narrative   82 y.o.femalewith a medical history significant for, but not limited to,hypertension, diabetes, PAD, and end-stage renal disease on hemodialysis. Admitted for bleeding from her dialysis graft in left arm. Doppler study negative for DVT, s/p placement of new right tunneld fem cath and new left upper AVGG , Shunt with chronic right heel ulcer and necrotic toes bilaterally, seen by Dr. Sharol Given, with recommendation for above-knee amputation, initially refusing, but currently considering surgery.   Subjective:    [name] today has, No headache, No chest pain, No abdominal pain , Complains of right leg pain   Assessment  & Plan :    Principal Problem:   Bleeding from dialysis shunt [facility]) Active Problems:   ESRD (end stage renal disease) on dialysis (HCC)   Hypertension   Acute post-hemorrhagic anemia   Incontinence of feces   Pressure ulcer   Absolute anemia   Osteomyelitis (HCC)   Bleeding from left AV graft - Vascular surgery consult greatly appreciated, status post thrombectomy and revision of AV graft, and placement of right femoral dialysis catheter, can use her left arm graft in 3 weeks per vascular surgery  Anemia of acute blood loss - Glycohemoglobin 6.5 in admission, received PRBC, hemoglobin remained 7.6 today, received another 2 units PRBC  Severe peripheral vascular disease with dry gangrene of  both feet - Up in approximately appreciated, recommendation is for above-knee amputation, patient initially refusing, but currently considering it, discussed with Dr. Sharol Given this morning, he will discuss with HCPOA .  History of breast cancer - status post total mastectomy for right-sided invasive ductal carcinoma in April 2015. Also has remote history of left breast cancer. Continue Arimidex  Hypertension - Continue with Toprol-XL  ESRD - HD  per renal.   Code Status : Patial  Family Communication  : D/W patient, None at bedside  Disposition Plan  : Pending further workup  Consults  :  Renal, vascular surgery, orthopedic  Procedures  :   Thrombectomy and revision of AV graft , insertion of right femoral dialysis catheter DVT Prophylaxis  :SCDs   Lab Results  Component Value Date   PLT 123 (L) 11/19/2015    Antibiotics  :   Anti-infectives    Start     Dose/Rate Route Frequency Ordered Stop   11/19/15 1200  vancomycin (VANCOCIN) 500 mg in sodium chloride 0.9 % 100 mL IVPB     500 mg 100 mL/hr over 60 Minutes Intravenous Every M-W-F (Hemodialysis) 11/17/15 1149     11/17/15 1215  piperacillin-tazobactam (ZOSYN) IVPB 3.375 g     3.375 g 12.5 mL/hr over 240 Minutes Intravenous Every 12 hours 11/17/15 1149     11/17/15 1200  vancomycin (VANCOCIN) IVPB 1000 mg/200 mL premix     1,000 mg 200 mL/hr over 60 Minutes Intravenous Every M-W-F (Hemodialysis) 11/17/15 1149 11/17/15 1725        Objective:   Vitals:   11/19/15 1025 11/19/15 1040 11/19/15 1054 11/19/15 1123  BP: (!) 124/46 (!) 123/48 (!) 119/51 (!) 130/50  Pulse: 64 63 61 63  Resp:    18  Temp: 98.3 F (36.8 C) 98.2 F (36.8 C) 98.2 F (36.8 C) 98.5 F (36.9 C)  TempSrc: Oral Oral Oral Oral  SpO2:      Weight:   47 kg (103 lb 9.9 oz)   Height:        Wt Readings from Last 3 Encounters:  11/19/15 47 kg (103 lb 9.9 oz)  11/04/15 46.7 kg (103 lb)  10/27/15 46.7 kg (103 lb)     Intake/Output Summary  (Last 24 hours) at 11/19/15 1630 Last data filed at 11/19/15 1054  Gross per 24 hour  Intake             1198 ml  Output             1300 ml  Net             -102 ml     Physical Exam  Awake Alert, Comfortable Supple Neck,No JVD, Symmetrical Chest wall movement, Good air movement bilaterally, CTAB RRR,No Gallops,Rubs or new Murmurs, No Parasternal Heave +ve B.Sounds, Abd Soft, No tenderness,No rebound - guarding or rigidity. No pedal pulses. No LE edema. Right heel ulcer, foul-smelling discharge from right foot    Data Review:    CBC  Recent Labs Lab 11/15/15 1017 11/15/15 1918 11/16/15 0343 11/16/15 1157 11/17/15 1300 11/19/15 0703  WBC 10.5 11.0* 13.1* 14.5* 15.3* 14.4*  HGB 8.9* 7.3* 6.5* 8.5* 8.1* 7.6*  HCT 28.3* 23.1* 20.1* 25.9* 24.9* 23.4*  PLT 202 153 163 142* 149* 123*  MCV 93.1 93.9 92.6 91.8 91.2 92.9  MCH 29.3 29.7 30.0 30.1 29.7 30.2  MCHC 31.4 31.6 32.3 32.8 32.5 32.5  RDW 15.2 15.0 15.0 14.6 14.6 14.6  LYMPHSABS 1.3  --   --   --   --   --   MONOABS 0.8  --   --   --   --   --   EOSABS 0.0  --   --   --   --   --   BASOSABS 0.0  --   --   --   --   --  Chemistries   Recent Labs Lab 11/15/15 1017 11/16/15 0343 11/17/15 1300 11/18/15 1542 11/19/15 0526 11/19/15 0702  NA 136 134* 133*  --   --  132*  K 3.6 3.9 4.7  --   --  3.6  CL 97* 100* 95*  --   --  95*  CO2 30 28 29   --   --  30  GLUCOSE 115* 143* 81 83 68 67  BUN 9 14 20   --   --  14  CREATININE 2.52* 3.06* 4.10*  --   --  2.87*  CALCIUM 9.8 9.5 9.9  --   --  9.6  AST 25  --   --   --   --   --   ALT 14  --   --   --   --   --   ALKPHOS 76  --   --   --   --   --   BILITOT 0.6  --   --   --   --   --    ------------------------------------------------------------------------------------------------------------------ No results for input(s): CHOL, HDL, LDLCALC, TRIG, CHOLHDL, LDLDIRECT in the last 72 hours.  No results found for:  HGBA1C ------------------------------------------------------------------------------------------------------------------ No results for input(s): TSH, T4TOTAL, T3FREE, THYROIDAB in the last 72 hours.  Invalid input(s): FREET3 ------------------------------------------------------------------------------------------------------------------ No results for input(s): VITAMINB12, FOLATE, FERRITIN, TIBC, IRON, RETICCTPCT in the last 72 hours.  Coagulation profile  Recent Labs Lab 11/15/15 1017  INR 1.04    No results for input(s): DDIMER in the last 72 hours.  Cardiac Enzymes No results for input(s): CKMB, TROPONINI, MYOGLOBIN in the last 168 hours.  Invalid input(s): CK ------------------------------------------------------------------------------------------------------------------ No results found for: BNP  Inpatient Medications  Scheduled Meds: . sodium chloride   Intravenous Once  . sodium chloride   Intravenous Once  . sodium chloride   Intravenous Once  . sodium chloride   Intravenous Once  . anastrozole  1 mg Oral QPC supper  . cinacalcet  60 mg Oral Q supper  . darbepoetin (ARANESP) injection - DIALYSIS  100 mcg Intravenous Q Mon-HD  . feeding supplement (NEPRO CARB STEADY)  237 mL Oral BID BM  . insulin aspart  0-9 Units Subcutaneous TID WC  . lanthanum  1,000 mg Oral Q supper  . metoprolol succinate  50 mg Oral QPC supper  . multivitamin  1 tablet Oral QHS  . piperacillin-tazobactam (ZOSYN)  IV  3.375 g Intravenous Q12H  . sodium chloride flush  3 mL Intravenous Q12H  . vancomycin  500 mg Intravenous Q M,W,F-HD   Continuous Infusions:  PRN Meds:.ondansetron **OR** ondansetron (ZOFRAN) IV, oxyCODONE-acetaminophen  Micro Results Recent Results (from the past 240 hour(s))  MRSA PCR Screening     Status: None   Collection Time: 11/16/15  9:46 AM  Result Value Ref Range Status   MRSA by PCR NEGATIVE NEGATIVE Final    Comment:        The GeneXpert MRSA Assay  (FDA approved for NASAL specimens only), is one component of a comprehensive MRSA colonization surveillance program. It is not intended to diagnose MRSA infection nor to guide or monitor treatment for MRSA infections.     Radiology Reports Dg Chest Port 1 View  Result Date: 11/15/2015 CLINICAL DATA:  Status post dialysis catheter insertion through the right groin. EXAM: PORTABLE CHEST 1 VIEW COMPARISON:  12/27/2013 FINDINGS: Large bore catheter is seen from inferior approach with tip overlying the expected location of the proximal right atrium. Vascular stent in  the region of the left brachiocephalic vein is stable. Cardiomediastinal silhouette is normal. Mediastinal contours appear intact. There is no evidence of focal airspace consolidation, pleural effusion or pneumothorax. Osseous structures are without acute abnormality. Soft tissues are grossly normal. IMPRESSION: No active disease. Large bore catheter from the inferior approach terminates in the expected location of proximal right atrium. Electronically Signed   By: Fidela Salisbury M.D.   On: 11/15/2015 16:52   Dg Abd Portable 1v  Result Date: 11/15/2015 CLINICAL DATA:  Change in bowel habits EXAM: PORTABLE ABDOMEN - 1 VIEW COMPARISON:  03/24/2012 FINDINGS: There is moderate gaseous distension of the lower abdominal and pelvic bowel loops. Average volume of stool noted within the colon. No dilated loops of small bowel identified. Right groin dialysis catheter is identified. The tip is within the cavoatrial junction. IMPRESSION: 1. Nonobstructive bowel gas pattern. 2. The dialysis catheter tip is in the cavoatrial junction Electronically Signed   By: Kerby Moors M.D.   On: 11/15/2015 16:40   Dg Foot Complete Left  Result Date: 11/16/2015 CLINICAL DATA:  Osteomyelitis.  Left foot pain. EXAM: LEFT FOOT - COMPLETE 3+ VIEW COMPARISON:  None. FINDINGS: There is irregularity of the distal left first toe soft tissues. There is absence of  the distal portion of the distal phalanx in the left first toe with associated erosive change at the distal osseous margin. There are multiple cortical erosions in the middle and proximal phalanges of the left fourth toe, with potential postsurgical changes in the proximal phalanx of the left fourth toe. Diffuse osteopenia. No fracture or dislocation. No suspicious focal osseous lesion. Diffuse vascular calcifications. No appreciable soft tissue gas. No periosteal reaction. IMPRESSION: 1. Absence of the distal portion of the distal phalanx in the left first toe with associated erosive change at the distal osseous margin and overlying soft tissue irregularity, suggesting acute osteomyelitis, potentially at the site of prior amputation, correlate with surgical history. 2. Multiple cortical erosions in the left fourth toe suggestive of acute osteomyelitis with potential postsurgical changes in the proximal phalanx of the left fourth toe, correlate with surgical history. 3. Diffuse osteopenia. Electronically Signed   By: Ilona Sorrel M.D.   On: 11/16/2015 18:00   Dg Foot Complete Right  Result Date: 11/16/2015 CLINICAL DATA:  Osteomyelitis EXAM: RIGHT FOOT COMPLETE - 3+ VIEW COMPARISON:  None. FINDINGS: Three views of the right foot submitted. There is diffuse osteopenia. The patient is status post amputation of first and second toes. There is diffuse osteopenia. No definite evidence of bone destruction to suggest osteomyelitis. Extensive atherosclerotic vascular calcifications are noted. No acute fracture or subluxation IMPRESSION: No acute fracture or subluxation. Diffuse osteopenia. Status post amputation of first and second toes. No definite evidence of bone destruction to suggest osteomyelitis. Electronically Signed   By: Lahoma Crocker M.D.   On: 11/16/2015 17:51   Dg Fluoro Guide Cv Line-no Report  Result Date: 11/15/2015 CLINICAL DATA:  FLOURO GUIDE CV LINE Fluoroscopy was utilized by the requesting  physician.  No radiographic interpretation.     [facility], Ephriam Turman M.D on 11/19/2015 at 4:30 PM  Between 7am to 7pm - Pager - [phone]  After 7pm go to [email or web address] - password [facility]  [facility] -  Office  [phone]

## 2015-11-19 NOTE — Progress Notes (Signed)
[facility] Progress Note   Subjective: Seen on HD- still willing to do amputation.  Getting blood with HD  Objective Vitals:   11/19/15 0700 11/19/15 0730 11/19/15 0800 11/19/15 0830  BP: (!) 136/50 (!) 119/45 (!) 123/43 (!) 119/42  Pulse: 64 61 60 (!) 55  Resp: 18 18 18 18   Temp: 98.7 F (37.1 C)     TempSrc: Oral     SpO2: 97%     Weight: 47.5 kg (104 lb 11.5 oz)     Height:       Physical Exam General: [name] Heart: S1,S2, RRR  Lungs: BBS slight dec in bases, CTA.No WOB.  Abdomen: soft, non-tender Extremities: R. Heel ulcer, demarcation on sole of R foot extending from mid foot to toes. Appears to have foul smelling drainage between 4 & 5th toes R foot. No pedal pulses. No LE edema. Dialysis Access: R fem TDC drsg intact. Drsg intact LUA.   Dialysis Orders: [facility] MWF 3.75 hours EDW 47 - 3K 2.5 Ca heparin 1700 calcitriol 0.5 profile 4 - NEW right fem cath and left upper AVGG 9/9 Early Last Mircera 75 on 7/19 Recent labs: hgb 12 8/23 w 42% sat- hgb  down to 10 on 9/6 - iPTH 324  Additional Objective Labs: Basic Metabolic Panel:  Recent Labs Lab 11/16/15 0343 11/17/15 1300 11/18/15 1542 11/19/15 0526 11/19/15 0702  NA 134* 133*  --   --  132*  K 3.9 4.7  --   --  3.6  CL 100* 95*  --   --  95*  CO2 28 29  --   --  30  GLUCOSE 143* 81 83 68 67  BUN 14 20  --   --  14  CREATININE 3.06* 4.10*  --   --  2.87*  CALCIUM 9.5 9.9  --   --  9.6  PHOS  --  3.2  --   --  2.4*   Liver Function Tests:  Recent Labs Lab 11/15/15 1017 11/17/15 1300 11/19/15 0702  AST 25  --   --   ALT 14  --   --   ALKPHOS 76  --   --   BILITOT 0.6  --   --   PROT 5.9*  --   --   ALBUMIN 2.3* 2.2* 1.9*   No results for input(s): LIPASE, AMYLASE in the last 168 hours. CBC:  Recent Labs Lab 11/15/15 1017 11/15/15 1918 11/16/15 0343 11/16/15 1157 11/17/15 1300 11/19/15 0703  WBC 10.5 11.0* 13.1* 14.5* 15.3* 14.4*  NEUTROABS 8.5*  --   --    --   --   --   HGB 8.9* 7.3* 6.5* 8.5* 8.1* 7.6*  HCT 28.3* 23.1* 20.1* 25.9* 24.9* 23.4*  MCV 93.1 93.9 92.6 91.8 91.2 92.9  PLT 202 153 163 142* 149* 123*   Blood Culture No results found for: SDES, SPECREQUEST, CULT, REPTSTATUS  Cardiac Enzymes: No results for input(s): CKTOTAL, CKMB, CKMBINDEX, TROPONINI in the last 168 hours. CBG:  Recent Labs Lab 11/18/15 1130 11/18/15 1154 11/18/15 1217 11/18/15 1556 11/18/15 2123  GLUCAP 40* 27* 33* 38* 89   Iron Studies: No results for input(s): IRON, TIBC, TRANSFERRIN, FERRITIN in the last 72 hours. @lablastinr3 @ Studies/Results: No results found. Medications:   . sodium chloride   Intravenous Once  . sodium chloride   Intravenous Once  . sodium chloride   Intravenous Once  . sodium chloride   Intravenous Once  .  anastrozole  1 mg Oral QPC supper  . cinacalcet  60 mg Oral Q supper  . darbepoetin (ARANESP) injection - DIALYSIS  100 mcg Intravenous Q Mon-HD  . feeding supplement (NEPRO CARB STEADY)  237 mL Oral BID BM  . heparin  1,700 Units Dialysis Once in dialysis  . insulin aspart  0-9 Units Subcutaneous TID WC  . lanthanum  1,000 mg Oral Q supper  . metoprolol succinate  50 mg Oral QPC supper  . multivitamin  1 tablet Oral QHS  . piperacillin-tazobactam (ZOSYN)  IV  3.375 g Intravenous Q12H  . sodium chloride flush  3 mL Intravenous Q12H  . vancomycin  500 mg Intravenous Q M,W,F-HD     Assessment/Plan:  1. s/p placement of new right tunneld fem cath and new left upper AVGG - due to recurrent bleeding with acute blood loss from old left upper AVGG 9/9- new R fem TDC per [facility]VS.  2. ESRD -  MWF - Next treatment Friday 3. Hypertension/volume  - BP controlled. HD 11/17/15. Pre wt 45.8 kg Net UF 500 post wt 45.8 kg.  Lower EDW on DC.  4. Anemia  - HGB 8.1 11/17/15.  start weekly venofer 50 at discharge.  On ESA- to get transfused today  5. Metabolic bone disease -  Ca 9.9 C Ca 11.34 Phos 3.2. Hold Calcitriol. Change to 2.0 Ca  bath. cont sensipar/fosrenol.  6. Nutrition - poor- added vitamins and nepro - doesn't like to eat breakfast 7. PAD with acute left 1st and 4th toe osteo per xrays- chronic right heel ulcer and necrotic toes bilaterally- previously being followed by podiatry with conservative treatment- defer management to [facility]- has mild leukocytosis.  Dr. Sharol Given has been consulted. He recommended amputation which pt initially refused.  now saying she will do amputation.   POA to speak with surgeon when final decision will be made.  Pt is NOT considering sign off of HD 8. Hx breast cancer - s/p right mastectomy on arimidex 9. Disp - ultimately to return home - has no children - has two caregivers that share her care but no one actually spends the night - they tuck her into bed with her phone and come back in the am early enough on dialysis days to get her ready for dialysis. She strongly desires return to this   [name], MD 11/19/2015

## 2015-11-19 NOTE — Progress Notes (Signed)
Pt's POA [name], was upset and concerned that patient was having surgery in the morning without her consent. Primary RN and Agricultural consultant informed her that pt had no orders for surgery and that she will be contacted by the MD if surgery was ordered. POA left unit angry and called legal services and spoke with Pete Pelt [phone]). Beth Cox then spoke with charge RN about her concerns and states she was available to assist in anyway. Dr. Waldron Labs made aware of the conflict and that pt's POA would like to be informed daily about the plan of care and of any surgeries and procedures.     Carole Civil, RN, [facility] [place] [address] [place] Phone [phone]

## 2015-11-19 NOTE — Procedures (Signed)
Patient was seen on dialysis and the procedure was supervised.  BFR 400  Via PC BP is  119/42.   Patient appears to be tolerating treatment well  [name] A 11/19/2015

## 2015-11-20 ENCOUNTER — Other Ambulatory Visit (HOSPITAL_COMMUNITY): Payer: Self-pay | Admitting: Family

## 2015-11-20 ENCOUNTER — Telehealth: Payer: Self-pay | Admitting: Cardiovascular Disease

## 2015-11-20 DIAGNOSIS — E43 Unspecified severe protein-calorie malnutrition: Secondary | ICD-10-CM

## 2015-11-20 LAB — GLUCOSE, CAPILLARY
GLUCOSE-CAPILLARY: 42 mg/dL — AB (ref 65–99)
GLUCOSE-CAPILLARY: 67 mg/dL (ref 65–99)
GLUCOSE-CAPILLARY: 103 mg/dL — AB (ref 65–99)
Glucose-Capillary: 105 mg/dL — ABNORMAL HIGH (ref 65–99)

## 2015-11-20 LAB — GLUCOSE, RANDOM
GLUCOSE: 31 mg/dL — AB (ref 65–99)
Glucose, Bld: 104 mg/dL — ABNORMAL HIGH (ref 65–99)

## 2015-11-20 LAB — TYPE AND SCREEN
ABO/RH(D): B POS
ANTIBODY SCREEN: NEGATIVE
UNIT DIVISION: 0
UNIT DIVISION: 0

## 2015-11-20 LAB — SURGICAL PCR SCREEN
MRSA, PCR: NEGATIVE
Staphylococcus aureus: NEGATIVE

## 2015-11-20 MED ORDER — CEFAZOLIN SODIUM-DEXTROSE 2-4 GM/100ML-% IV SOLN
2.0000 g | INTRAVENOUS | Status: AC
  Administered 2015-11-21: 2 g via INTRAVENOUS
  Filled 2015-11-20 (×2): qty 100

## 2015-11-20 MED ORDER — PRO-STAT SUGAR FREE PO LIQD
30.0000 mL | Freq: Two times a day (BID) | ORAL | Status: DC
  Administered 2015-11-21 – 2015-11-24 (×5): 30 mL via ORAL
  Filled 2015-11-20 (×7): qty 30

## 2015-11-20 NOTE — Progress Notes (Signed)
Pharmacy Antibiotic Note  [name] is a 82 y.o. female admitted on 11/15/2015 with osteomyelitis.  Pharmacy has been consulted for vancomycin and Zosyn dosing.  Plan is for AKA tomorrow 9/15 in the afternoon after patient goes to HD in the morning.  Plan: -Vancomycin 500mg  IV qHD-MWF -Pre-HD vancomycin level goal 15-54mcg/mL -Zosyn 3.375g IV q12h EI -follow c/s, HD schedule/tolerance, length of therapy after amputation  Height: 5\' 3"  (160 cm) Weight: 111 lb 8.8 oz (50.6 kg) IBW/kg (Calculated) : 52.4  Temp (24hrs), Avg:98.4 F (36.9 C), Min:97.6 F (36.4 C), Max:98.9 F (37.2 C)   Recent Labs Lab 11/15/15 1017 11/15/15 1918 11/16/15 0343 11/16/15 1157 11/17/15 1300 11/19/15 0702 11/19/15 0703  WBC 10.5 11.0* 13.1* 14.5* 15.3*  --  14.4*  CREATININE 2.52*  --  3.06*  --  4.10* 2.87*  --     Estimated Creatinine Clearance: 12.5 mL/min (by C-G formula based on SCr of 2.87 mg/dL (H)).    Allergies  Allergen Reactions  . Aspirin Other (See Comments)    NO BLOOD THINNERS OF ANY KIND-bleeding events    Antimicrobials this admission: Vanc 9/11 >>  Zosyn 9/11 >>   Dose adjustments this admission: N/a  Microbiology results: 9/10 MRSA PCR: neg  Thank you for allowing pharmacy to be a part of this patient's care.  Camilla Skeen D. Giancarlo Askren, PharmD, BCPS Clinical Pharmacist Pager: [phone] 11/20/2015 11:00 AM

## 2015-11-20 NOTE — Telephone Encounter (Signed)
Returned call-Wanda (ok per DPR) states patient is in the hospital and is having surgery tomorrow (BL AKA by MD Sharol Given) and wanted MD Fletcher Anon to be aware.  Advised I would route to MD to make aware.  Verbalized understanding.

## 2015-11-20 NOTE — Progress Notes (Signed)
[name] Progress Note   Subjective:  "Nothing hurts except my legs. I'm getting both of them done tomorrow!" Pleasant, seems confident about bilateral BKA tomorrow. NAD  Objective Vitals:   11/19/15 1755 11/19/15 2045 11/20/15 0524 11/20/15 0947  BP: (!) 138/47 131/62 (!) 151/45 (!) 148/43  Pulse: 74 74 69 68  Resp: 17 19 19 17   Temp: 98.9 F (37.2 C) 98.3 F (36.8 C) 98.5 F (36.9 C) 97.6 F (36.4 C)  TempSrc: Oral Oral Oral Oral  SpO2: 99% 99% 100% 100%  Weight:  50.6 kg (111 lb 8.8 oz)    Height:       Physical Exam General: [name] Heart: S1,S2, RRR  Lungs: BBS slight dec in bases, CTA.No WOB.  Abdomen: soft, non-tender Extremities: R. Heel ulcer, demarcation on sole of R foot extending from mid foot to toes. Appears to have foul smelling drainage between 4 & 5th toes R foot. No pedal pulses. No LE edema. Dialysis Access: R fem TDC drsg intact. Drsg intact LUA.   Dialysis Orders: [place] MWF 3.75 hours EDW 47 - 3K 2.5 Ca heparin 1700 calcitriol 0.5 profile 4 - NEW right fem cath and left upper AVGG 9/9 Early Last Mircera 75 on 7/19 Recent labs: hgb 12 8/23 w 42% sat- hgb  down to 10 on 9/6 - iPTH 324  Additional Objective Labs: Basic Metabolic Panel:  Recent Labs Lab 11/16/15 0343 11/17/15 1300  11/19/15 0702 11/19/15 1649 11/20/15 0833  NA 134* 133*  --  132*  --   --   K 3.9 4.7  --  3.6  --   --   CL 100* 95*  --  95*  --   --   CO2 28 29  --  30  --   --   GLUCOSE 143* 81  < > 67 67 31*  BUN 14 20  --  14  --   --   CREATININE 3.06* 4.10*  --  2.87*  --   --   CALCIUM 9.5 9.9  --  9.6  --   --   PHOS  --  3.2  --  2.4*  --   --   < > = values in this interval not displayed. Liver Function Tests:  Recent Labs Lab 11/15/15 1017 11/17/15 1300 11/19/15 0702  AST 25  --   --   ALT 14  --   --   ALKPHOS 76  --   --   BILITOT 0.6  --   --   PROT 5.9*  --   --   ALBUMIN 2.3* 2.2* 1.9*   No results for  input(s): LIPASE, AMYLASE in the last 168 hours. CBC:  Recent Labs Lab 11/15/15 1017 11/15/15 1918 11/16/15 0343 11/16/15 1157 11/17/15 1300 11/19/15 0703  WBC 10.5 11.0* 13.1* 14.5* 15.3* 14.4*  NEUTROABS 8.5*  --   --   --   --   --   HGB 8.9* 7.3* 6.5* 8.5* 8.1* 7.6*  HCT 28.3* 23.1* 20.1* 25.9* 24.9* 23.4*  MCV 93.1 93.9 92.6 91.8 91.2 92.9  PLT 202 153 163 142* 149* 123*   Blood Culture No results found for: SDES, SPECREQUEST, CULT, REPTSTATUS  Cardiac Enzymes: No results for input(s): CKTOTAL, CKMB, CKMBINDEX, TROPONINI in the last 168 hours. CBG:  Recent Labs Lab 11/18/15 1217 11/18/15 1556 11/18/15 2123 11/20/15 0954 11/20/15 1113  GLUCAP 33* 38* 89 42* 67   Iron Studies: No results for  input(s): IRON, TIBC, TRANSFERRIN, FERRITIN in the last 72 hours. @lablastinr3 @ Studies/Results: No results found. Medications:   . sodium chloride   Intravenous Once  . sodium chloride   Intravenous Once  . sodium chloride   Intravenous Once  . sodium chloride   Intravenous Once  . anastrozole  1 mg Oral QPC supper  . cinacalcet  60 mg Oral Q supper  . darbepoetin (ARANESP) injection - DIALYSIS  100 mcg Intravenous Q Mon-HD  . feeding supplement (NEPRO CARB STEADY)  237 mL Oral BID BM  . lanthanum  1,000 mg Oral Q supper  . metoprolol succinate  50 mg Oral QPC supper  . multivitamin  1 tablet Oral QHS  . piperacillin-tazobactam (ZOSYN)  IV  3.375 g Intravenous Q12H  . sodium chloride flush  3 mL Intravenous Q12H  . vancomycin  500 mg Intravenous Q M,W,F-HD     Assessment/Plan: 1. s/p placement of new right tunneld fem cath and new left upper AVGG - due to recurrent bleeding with acute blood loss from old left upper AVGG 9/9- new R fem TDC per VVS. No issues with HD yesterday.  2. ESRD- MWF - Next treatment Friday tomorrow 1st thing in AM. No heparin.  3. Hypertension/volume- BP controlled. HD 11/19/15 Pre wt 47.5 Net UF 1800 Post wt 47 kg. At OP EDW.Attempt UFG  1-1.5 kg tomorrow. Lower EDW on DC.  4. Anemia- HGB 7.6 , transfused 2 units PRBCs on HD yesterday. Recheck HGB with HD tomorrow. Start weekly venofer 50 at discharge.  On ESA- rec'd Aranesp 100 mcg IV [id number].   5. Metabolic bone disease- Ca 9.6 C Ca 11.28 Phos 2.4. Hold Calcitriol. Change to 2.0 Ca bath. cont sensipar/ DC fosrenol.  6. Nutrition- Albumin 1.9 poor PO intake. Liberalize diet to regular diet with fld restrictions, - added vitamins and prostat. 7. PAD with acute left 1st and 4th toe osteoper xrays- chronic right heel ulcer and necrotic toes bilaterally- previously being followed by podiatry with conservative treatment- defer management to VVS- has mild leukocytosis.  Dr. Sharol Given has been consulted. He recommended amputation which pt initially refused.  now saying she will do amputation.   POA to speak with surgeon when final decision will be made.  Pt is NOT considering sign off of HD. For bilateral  BKA tomorrow.  8. Hx breast cancer - s/p right mastectomy on arimidex 9. Disp - ultimately to return home - has no children - has two caregivers that share her care but no one actually spends the night - they tuck her into bed with her phone and come back in the am early enough on dialysis days to get her ready for dialysis. She strongly desires return to this   [employer] Shevaun Lovan NP-C 11/20/2015, 11:23 AM  [employer] [phone]

## 2015-11-20 NOTE — Care Management Important Message (Signed)
Important Message  Patient Details  Name: [name] MRN: [id number] Date of Birth: [birth year]   Medicare Important Message Given:       Nathen May 11/20/2015, 11:22 AM

## 2015-11-20 NOTE — Progress Notes (Addendum)
09:49am Patients blood glucose 31. Dr Waldron Labs aware. New orders, change diet to Renal Diet from CC.  Perform CBG times 1 now. Patient consumed 3 juices and peanut butter with crackers. Will continue to monitor.   10:26am Patients CBG 42. Dr. Johnette Abraham aware. Patient awake, alert and oriented. Eating and drinking. ACHS CBG's ordered. D/C Coverage. Will continue to monitor.   Sheliah Plane RN

## 2015-11-20 NOTE — Telephone Encounter (Signed)
New message     FYI    Pt is currently in the hosp and is scheduled for surgery to have amputation of her legs and feet. She is calling to see if the doctor wants to be involved in some way. Please call.

## 2015-11-20 NOTE — Progress Notes (Addendum)
PROGRESS NOTE                                                                                                                                                                                                             Patient Demographics:    [name], is a 82 y.o. female, DOB - Feb 22, 1933, [id number]  Admit date - 11/15/2015   Admitting Physician Kinnie Feil, MD  Outpatient Primary MD for the patient is [name], MD  LOS - 4   Chief Complaint  Patient presents with  . graft bleeding       Brief Narrative   82 y.o.femalewith a medical history significant for, but not limited to,hypertension, diabetes, PAD, and end-stage renal disease on hemodialysis. Admitted for bleeding from her dialysis graft in left arm. Doppler study negative for DVT, s/p placement of new right tunneld fem cath and new left upper AVGG , Shunt with chronic right heel ulcer and necrotic toes bilaterally, seen by Dr. Sharol Given, with recommendation for above-knee amputation, initially refusing, but currently Agreeable to surgery   Subjective:    [name] today has, No headache, No chest pain, No abdominal pain , Complains of right leg pain   Assessment  & Plan :    Principal Problem:   Bleeding from dialysis shunt [facility]) Active Problems:   ESRD (end stage renal disease) on dialysis (HCC)   Hypertension   Acute post-hemorrhagic anemia   Incontinence of feces   Pressure ulcer   Absolute anemia   Osteomyelitis ([place])   Protein-calorie malnutrition, severe   Bleeding from left AV graft - Vascular surgery consult greatly appreciated, status post thrombectomy and revision of AV graft, and placement of right femoral dialysis catheter, can use her left arm graft in 3 weeks per vascular surgery  Anemia of acute blood loss - Glycohemoglobin 6.5 in admission, Required 3 units PRBC transfusions so far, most recent 2 units on 9/13 giving significant anemia  with hemoglobin of 7.6 and anticipation for surgery.  Severe peripheral vascular disease with dry gangrene of both feet - Orthopedic consult greatly appreciated, recommendation is for above-knee amputation, patient initially refusing, but currently agreeable to it , discussed with patient HCPOA Ms [name],  she discussed with Dr. Sharol Given, agreeable to surgery, she is telling me surgery tomorrow afternoon . - With evidence of osteoarthritis on  foot x-ray, Continue with IV vancomycin and Zosyn  History of breast cancer - status post total mastectomy for right-sided invasive ductal carcinoma in April 2015. Also has remote history of left breast cancer. Continue Arimidex  Hypertension - Continue with Toprol-XL  ESRD - HD per renal.  Hypoglycemia Patient with significant hypoglycemia today, CBGs in the 30s and 40s, and she remains symptomatic, discontinued insulin sliding scale, and discontinued carb modified diet.   Severe protein calorie malnutrition - continue with supplements  Code Status : Patial  Family Communication  : D/W patient,D/W HCPOA via phone Disposition Plan  : Pending further workup  Consults  :  Renal, vascular surgery, orthopedic  Procedures  :   Thrombectomy and revision of AV graft , insertion of right femoral dialysis catheter DVT Prophylaxis  :SCDs   Lab Results  Component Value Date   PLT 123 (L) 11/19/2015    Antibiotics  :   Anti-infectives    Start     Dose/Rate Route Frequency Ordered Stop   11/21/15 1500  ceFAZolin (ANCEF) IVPB 2g/100 mL premix     2 g 200 mL/hr over 30 Minutes Intravenous To ShortStay Surgical 11/20/15 1202 11/22/15 1500   11/19/15 1200  vancomycin (VANCOCIN) 500 mg in sodium chloride 0.9 % 100 mL IVPB     500 mg 100 mL/hr over 60 Minutes Intravenous Every M-W-F (Hemodialysis) 11/17/15 1149     11/17/15 1215  piperacillin-tazobactam (ZOSYN) IVPB 3.375 g     3.375 g 12.5 mL/hr over 240 Minutes Intravenous Every 12 hours 11/17/15  1149     11/17/15 1200  vancomycin (VANCOCIN) IVPB 1000 mg/200 mL premix     1,000 mg 200 mL/hr over 60 Minutes Intravenous Every M-W-F (Hemodialysis) 11/17/15 1149 11/17/15 1725        Objective:   Vitals:   11/19/15 2045 11/20/15 0524 11/20/15 0947 11/20/15 1700  BP: 131/62 (!) 151/45 (!) 148/43 (!) 152/44  Pulse: 74 69 68   Resp: 19 19 17 17   Temp: 98.3 F (36.8 C) 98.5 F (36.9 C) 97.6 F (36.4 C) 99.3 F (37.4 C)  TempSrc: Oral Oral Oral Oral  SpO2: 99% 100% 100%   Weight: 50.6 kg (111 lb 8.8 oz)     Height:        Wt Readings from Last 3 Encounters:  11/19/15 50.6 kg (111 lb 8.8 oz)  11/04/15 46.7 kg (103 lb)  10/27/15 46.7 kg (103 lb)     Intake/Output Summary (Last 24 hours) at 11/20/15 1740 Last data filed at 11/20/15 1330  Gross per 24 hour  Intake              423 ml  Output                0 ml  Net              423 ml     Physical Exam  Awake Alert, Comfortable Supple Neck,No JVD, Symmetrical Chest wall movement, Good air movement bilaterally, CTAB RRR,No Gallops,Rubs or new Murmurs, No Parasternal Heave +ve B.Sounds, Abd Soft, No tenderness,No rebound - guarding or rigidity. No pedal pulses. No LE edema. Right heel ulcer, foul-smelling discharge from right foot    Data Review:    CBC  Recent Labs Lab 11/15/15 1017 11/15/15 1918 11/16/15 0343 11/16/15 1157 11/17/15 1300 11/19/15 0703  WBC 10.5 11.0* 13.1* 14.5* 15.3* 14.4*  HGB 8.9* 7.3* 6.5* 8.5* 8.1* 7.6*  HCT 28.3* 23.1* 20.1* 25.9* 24.9* 23.4*  PLT 202 153 163 142* 149* 123*  MCV 93.1 93.9 92.6 91.8 91.2 92.9  MCH 29.3 29.7 30.0 30.1 29.7 30.2  MCHC 31.4 31.6 32.3 32.8 32.5 32.5  RDW 15.2 15.0 15.0 14.6 14.6 14.6  LYMPHSABS 1.3  --   --   --   --   --   MONOABS 0.8  --   --   --   --   --   EOSABS 0.0  --   --   --   --   --   BASOSABS 0.0  --   --   --   --   --     Chemistries   Recent Labs Lab 11/15/15 1017 11/16/15 0343 11/17/15 1300  11/19/15 0526 11/19/15 0702  11/19/15 1649 11/20/15 0833 11/20/15 1644  NA 136 134* 133*  --   --  132*  --   --   --   K 3.6 3.9 4.7  --   --  3.6  --   --   --   CL 97* 100* 95*  --   --  95*  --   --   --   CO2 30 28 29   --   --  30  --   --   --   GLUCOSE 115* 143* 81  < > 68 67 67 31* 104*  BUN 9 14 20   --   --  14  --   --   --   CREATININE 2.52* 3.06* 4.10*  --   --  2.87*  --   --   --   CALCIUM 9.8 9.5 9.9  --   --  9.6  --   --   --   AST 25  --   --   --   --   --   --   --   --   ALT 14  --   --   --   --   --   --   --   --   ALKPHOS 76  --   --   --   --   --   --   --   --   BILITOT 0.6  --   --   --   --   --   --   --   --   < > = values in this interval not displayed. ------------------------------------------------------------------------------------------------------------------ No results for input(s): CHOL, HDL, LDLCALC, TRIG, CHOLHDL, LDLDIRECT in the last 72 hours.  Lab Results  Component Value Date   HGBA1C 5.2 11/17/2015   ------------------------------------------------------------------------------------------------------------------ No results for input(s): TSH, T4TOTAL, T3FREE, THYROIDAB in the last 72 hours.  Invalid input(s): FREET3 ------------------------------------------------------------------------------------------------------------------ No results for input(s): VITAMINB12, FOLATE, FERRITIN, TIBC, IRON, RETICCTPCT in the last 72 hours.  Coagulation profile  Recent Labs Lab 11/15/15 1017  INR 1.04    No results for input(s): DDIMER in the last 72 hours.  Cardiac Enzymes No results for input(s): CKMB, TROPONINI, MYOGLOBIN in the last 168 hours.  Invalid input(s): CK ------------------------------------------------------------------------------------------------------------------ No results found for: BNP  Inpatient Medications  Scheduled Meds: . sodium chloride   Intravenous Once  . sodium chloride   Intravenous Once  . sodium chloride   Intravenous Once    . sodium chloride   Intravenous Once  . anastrozole  1 mg Oral QPC supper  . [START ON 11/21/2015]  ceFAZolin (ANCEF) IV  2 g Intravenous To SS-Surg  . cinacalcet  60 mg Oral  Q supper  . darbepoetin (ARANESP) injection - DIALYSIS  100 mcg Intravenous Q Mon-HD  . feeding supplement (NEPRO CARB STEADY)  237 mL Oral BID BM  . feeding supplement (PRO-STAT SUGAR FREE 64)  30 mL Oral BID  . metoprolol succinate  50 mg Oral QPC supper  . multivitamin  1 tablet Oral QHS  . piperacillin-tazobactam (ZOSYN)  IV  3.375 g Intravenous Q12H  . sodium chloride flush  3 mL Intravenous Q12H  . vancomycin  500 mg Intravenous Q M,W,F-HD   Continuous Infusions:  PRN Meds:.ondansetron **OR** ondansetron (ZOFRAN) IV, oxyCODONE-acetaminophen  Micro Results Recent Results (from the past 240 hour(s))  MRSA PCR Screening     Status: None   Collection Time: 11/16/15  9:46 AM  Result Value Ref Range Status   MRSA by PCR NEGATIVE NEGATIVE Final    Comment:        The GeneXpert MRSA Assay (FDA approved for NASAL specimens only), is one component of a comprehensive MRSA colonization surveillance program. It is not intended to diagnose MRSA infection nor to guide or monitor treatment for MRSA infections.     Radiology Reports Dg Chest Port 1 View  Result Date: 11/15/2015 CLINICAL DATA:  Status post dialysis catheter insertion through the right groin. EXAM: PORTABLE CHEST 1 VIEW COMPARISON:  12/27/2013 FINDINGS: Large bore catheter is seen from inferior approach with tip overlying the expected location of the proximal right atrium. Vascular stent in the region of the left brachiocephalic vein is stable. Cardiomediastinal silhouette is normal. Mediastinal contours appear intact. There is no evidence of focal airspace consolidation, pleural effusion or pneumothorax. Osseous structures are without acute abnormality. Soft tissues are grossly normal. IMPRESSION: No active disease. Large bore catheter from the  inferior approach terminates in the expected location of proximal right atrium. Electronically Signed   By: Fidela Salisbury M.D.   On: 11/15/2015 16:52   Dg Abd Portable 1v  Result Date: 11/15/2015 CLINICAL DATA:  Change in bowel habits EXAM: PORTABLE ABDOMEN - 1 VIEW COMPARISON:  03/24/2012 FINDINGS: There is moderate gaseous distension of the lower abdominal and pelvic bowel loops. Average volume of stool noted within the colon. No dilated loops of small bowel identified. Right groin dialysis catheter is identified. The tip is within the cavoatrial junction. IMPRESSION: 1. Nonobstructive bowel gas pattern. 2. The dialysis catheter tip is in the cavoatrial junction Electronically Signed   By: Kerby Moors M.D.   On: 11/15/2015 16:40   Dg Foot Complete Left  Result Date: 11/16/2015 CLINICAL DATA:  Osteomyelitis.  Left foot pain. EXAM: LEFT FOOT - COMPLETE 3+ VIEW COMPARISON:  None. FINDINGS: There is irregularity of the distal left first toe soft tissues. There is absence of the distal portion of the distal phalanx in the left first toe with associated erosive change at the distal osseous margin. There are multiple cortical erosions in the middle and proximal phalanges of the left fourth toe, with potential postsurgical changes in the proximal phalanx of the left fourth toe. Diffuse osteopenia. No fracture or dislocation. No suspicious focal osseous lesion. Diffuse vascular calcifications. No appreciable soft tissue gas. No periosteal reaction. IMPRESSION: 1. Absence of the distal portion of the distal phalanx in the left first toe with associated erosive change at the distal osseous margin and overlying soft tissue irregularity, suggesting acute osteomyelitis, potentially at the site of prior amputation, correlate with surgical history. 2. Multiple cortical erosions in the left fourth toe suggestive of acute osteomyelitis with potential postsurgical changes in  the proximal phalanx of the left fourth  toe, correlate with surgical history. 3. Diffuse osteopenia. Electronically Signed   By: Ilona Sorrel M.D.   On: 11/16/2015 18:00   Dg Foot Complete Right  Result Date: 11/16/2015 CLINICAL DATA:  Osteomyelitis EXAM: RIGHT FOOT COMPLETE - 3+ VIEW COMPARISON:  None. FINDINGS: Three views of the right foot submitted. There is diffuse osteopenia. The patient is status post amputation of first and second toes. There is diffuse osteopenia. No definite evidence of bone destruction to suggest osteomyelitis. Extensive atherosclerotic vascular calcifications are noted. No acute fracture or subluxation IMPRESSION: No acute fracture or subluxation. Diffuse osteopenia. Status post amputation of first and second toes. No definite evidence of bone destruction to suggest osteomyelitis. Electronically Signed   By: Lahoma Crocker M.D.   On: 11/16/2015 17:51   Dg Fluoro Guide Cv Line-no Report  Result Date: 11/15/2015 CLINICAL DATA:  FLOURO GUIDE CV LINE Fluoroscopy was utilized by the requesting physician.  No radiographic interpretation.     Waldron Labs, Sujay Grundman M.D on 11/20/2015 at 5:40 PM  Between 7am to 7pm - Pager - [phone]  After 7pm go to [email or web address] - password [facility]  [facility] -  Office  [phone]

## 2015-11-20 NOTE — Telephone Encounter (Signed)
I called and discussed the case with [name].

## 2015-11-20 NOTE — Progress Notes (Signed)
Initial Nutrition Assessment  DOCUMENTATION CODES:   Severe malnutrition in context of chronic illness  INTERVENTION:  Continue Nepro Shake po BID, each supplement provides 425 kcal and 19 grams protein.  Continue 30 ml Prostat po BID, each supplement provides 100 kcal and 15 grams of protein.   Encourage adequate PO intake.   NUTRITION DIAGNOSIS:   Malnutrition related to chronic illness as evidenced by severe depletion of body fat, severe depletion of muscle mass.  GOAL:   Patient will meet greater than or equal to 90% of their needs  MONITOR:   PO intake, Supplement acceptance, Labs, Weight trends, Skin, I & O's  REASON FOR ASSESSMENT:   Low Braden    ASSESSMENT:   82 y.o. female with a medical history significant for, but not  limited to, hypertension, diabetes, PAD, and end-stage renal disease on hemodialysis. Admitted for bleeding from her dialysis graft in left arm. Doppler study negative for DVT, s/p placement of new right tunneld fem cath and new left upper AVGG , Shunt with chronic right heel ulcer and necrotic toes bilaterally, seen by Dr. Sharol Given, with recommendation for above-knee amputation, initially refusing, but currently considering surgery. Plans for bilateral BKA tomorrow.   Meal completion has been 10-25%. Pt reports appetite is fine however did not like her food provided on the renal/carb mod diet. Noted diet has just been advanced to a regular diet. Pt reports usual consumption of at least 2-3 meals a day PTA with a Nepro Shake at least once daily. Pt with no weight loss per weight records. Noted Nepro and Prostat has been ordered. RD to continue with current orders to aid in caloric and protein needs as well as in wound healing.   Nutrition-Focused physical exam completed. Findings are severe fat depletion, severe muscle depletion, and moderate edema.   Labs and medications reviewed.   Diet Order:  Diet regular Room service appropriate? Yes; Fluid  consistency: Thin; Fluid restriction: 1200 mL Fluid  Skin:  Wound (see comment) (stage II on sacrum, chronic gangrene on both feet)  Last BM:  9/9  Height:   Ht Readings from Last 1 Encounters:  11/15/15 5\' 3"  (1.6 m)    Weight:   Wt Readings from Last 1 Encounters:  11/19/15 111 lb 8.8 oz (50.6 kg)    Ideal Body Weight:  52.27 kg  BMI:  Body mass index is 19.76 kg/m.  Estimated Nutritional Needs:   Kcal:  1500-1700  Protein:  70-85 grams  Fluid:  1.2 L/day  EDUCATION NEEDS:   No education needs identified at this time  Corrin Parker, MS, RD, LDN Pager # [phone] After hours/ weekend pager # [phone]

## 2015-11-21 ENCOUNTER — Inpatient Hospital Stay (HOSPITAL_COMMUNITY): Payer: Medicare Other | Admitting: Anesthesiology

## 2015-11-21 ENCOUNTER — Encounter (HOSPITAL_COMMUNITY): Payer: Self-pay | Admitting: Surgery

## 2015-11-21 ENCOUNTER — Encounter (HOSPITAL_COMMUNITY)
Admission: EM | Disposition: A | Discharge status: Skilled Nursing Facility | Payer: Self-pay | Source: Home / Self Care | Attending: Internal Medicine

## 2015-11-21 HISTORY — PX: AMPUTATION: SHX166

## 2015-11-21 LAB — CBC
HCT: 39.3 % (ref 36.0–46.0)
Hemoglobin: 13.1 g/dL (ref 12.0–15.0)
MCH: 30.4 pg (ref 26.0–34.0)
MCHC: 33.3 g/dL (ref 30.0–36.0)
MCV: 91.2 fL (ref 78.0–100.0)
Platelets: 103 10*3/uL — ABNORMAL LOW (ref 150–400)
RBC: 4.31 MIL/uL (ref 3.87–5.11)
RDW: 15.9 % — ABNORMAL HIGH (ref 11.5–15.5)
WBC: 15.6 10*3/uL — ABNORMAL HIGH (ref 4.0–10.5)

## 2015-11-21 LAB — GLUCOSE, CAPILLARY
GLUCOSE-CAPILLARY: 55 mg/dL — AB (ref 65–99)
GLUCOSE-CAPILLARY: 85 mg/dL (ref 65–99)
GLUCOSE-CAPILLARY: 80 mg/dL (ref 65–99)
GLUCOSE-CAPILLARY: 64 mg/dL — AB (ref 65–99)
GLUCOSE-CAPILLARY: 72 mg/dL (ref 65–99)
Glucose-Capillary: 62 mg/dL — ABNORMAL LOW (ref 65–99)

## 2015-11-21 LAB — GLUCOSE, RANDOM: Glucose, Bld: 102 mg/dL — ABNORMAL HIGH (ref 65–99)

## 2015-11-21 LAB — BASIC METABOLIC PANEL
ANION GAP: 11 (ref 5–15)
BUN: 16 mg/dL (ref 6–20)
CALCIUM: 9.8 mg/dL (ref 8.9–10.3)
CO2: 27 mmol/L (ref 22–32)
CREATININE: 2.63 mg/dL — AB (ref 0.44–1.00)
Chloride: 95 mmol/L — ABNORMAL LOW (ref 101–111)
GFR calc Af Amer: 19 mL/min — ABNORMAL LOW (ref >60)
GFR calc non Af Amer: 16 mL/min — ABNORMAL LOW (ref >60)
GLUCOSE: 70 mg/dL (ref 65–99)
Potassium: 3.6 mmol/L (ref 3.5–5.1)
Sodium: 133 mmol/L — ABNORMAL LOW (ref 135–145)

## 2015-11-21 SURGERY — AMPUTATION, ABOVE KNEE
Anesthesia: General | Laterality: Bilateral

## 2015-11-21 MED ORDER — SODIUM CHLORIDE 0.9 % IV SOLN
INTRAVENOUS | Status: DC
  Administered 2015-11-21: 16:00:00 via INTRAVENOUS

## 2015-11-21 MED ORDER — HYDROMORPHONE HCL 1 MG/ML IJ SOLN: 1.0000 mg | INTRAMUSCULAR | Status: DC | PRN

## 2015-11-21 MED ORDER — ONDANSETRON HCL 4 MG/2ML IJ SOLN
INTRAMUSCULAR | Status: AC
  Filled 2015-11-21: qty 2

## 2015-11-21 MED ORDER — DEXTROSE 50 % IV SOLN
INTRAVENOUS | Status: AC
  Administered 2015-11-21: 50 mL
  Filled 2015-11-21: qty 50

## 2015-11-21 MED ORDER — FENTANYL CITRATE (PF) 100 MCG/2ML IJ SOLN
25.0000 ug | INTRAMUSCULAR | Status: DC | PRN
  Administered 2015-11-21: 50 ug via INTRAVENOUS

## 2015-11-21 MED ORDER — FENTANYL CITRATE (PF) 100 MCG/2ML IJ SOLN
INTRAMUSCULAR | Status: DC | PRN
  Administered 2015-11-21: 100 ug via INTRAVENOUS

## 2015-11-21 MED ORDER — FENTANYL CITRATE (PF) 100 MCG/2ML IJ SOLN
INTRAMUSCULAR | Status: AC
  Filled 2015-11-21: qty 2

## 2015-11-21 MED ORDER — ONDANSETRON HCL 4 MG/2ML IJ SOLN
INTRAMUSCULAR | Status: DC | PRN
  Administered 2015-11-21: 4 mg via INTRAVENOUS

## 2015-11-21 MED ORDER — DEXTROSE 50 % IV SOLN: 25.0000 mL | Freq: Once | INTRAVENOUS | Status: DC

## 2015-11-21 MED ORDER — ACETAMINOPHEN 325 MG PO TABS: 650.0000 mg | ORAL_TABLET | Freq: Four times a day (QID) | ORAL | Status: DC | PRN

## 2015-11-21 MED ORDER — VANCOMYCIN HCL IN DEXTROSE 500-5 MG/100ML-% IV SOLN
INTRAVENOUS | Status: AC
  Filled 2015-11-21: qty 100

## 2015-11-21 MED ORDER — DEXMEDETOMIDINE HCL IN NACL 200 MCG/50ML IV SOLN
INTRAVENOUS | Status: AC
  Filled 2015-11-21: qty 50

## 2015-11-21 MED ORDER — SUCCINYLCHOLINE CHLORIDE 20 MG/ML IJ SOLN
INTRAMUSCULAR | Status: DC | PRN
  Administered 2015-11-21: 50 mg via INTRAVENOUS

## 2015-11-21 MED ORDER — CHLORHEXIDINE GLUCONATE 4 % EX LIQD
60.0000 mL | Freq: Once | CUTANEOUS | Status: DC
  Filled 2015-11-21: qty 60

## 2015-11-21 MED ORDER — SODIUM CHLORIDE 0.9 % IV SOLN
INTRAVENOUS | Status: DC
  Administered 2015-11-21: 22:00:00 via INTRAVENOUS

## 2015-11-21 MED ORDER — METOCLOPRAMIDE HCL 5 MG PO TABS
5.0000 mg | ORAL_TABLET | Freq: Three times a day (TID) | ORAL | Status: DC | PRN
Start: 2015-11-21 — End: 2015-11-24

## 2015-11-21 MED ORDER — ONDANSETRON HCL 4 MG PO TABS: 4.0000 mg | ORAL_TABLET | Freq: Four times a day (QID) | ORAL | Status: DC | PRN

## 2015-11-21 MED ORDER — ACETAMINOPHEN 650 MG RE SUPP: 650.0000 mg | Freq: Four times a day (QID) | RECTAL | Status: DC | PRN

## 2015-11-21 MED ORDER — PROPOFOL 10 MG/ML IV BOLUS
INTRAVENOUS | Status: DC | PRN
  Administered 2015-11-21: 30 mg via INTRAVENOUS
  Administered 2015-11-21: 100 mg via INTRAVENOUS

## 2015-11-21 MED ORDER — METHOCARBAMOL 1000 MG/10ML IJ SOLN
500.0000 mg | Freq: Four times a day (QID) | INTRAMUSCULAR | Status: DC | PRN
  Filled 2015-11-21: qty 5

## 2015-11-21 MED ORDER — ONDANSETRON HCL 4 MG/2ML IJ SOLN: 4.0000 mg | Freq: Four times a day (QID) | INTRAMUSCULAR | Status: DC | PRN

## 2015-11-21 MED ORDER — PHENYLEPHRINE HCL 10 MG/ML IJ SOLN
INTRAVENOUS | Status: DC | PRN
  Administered 2015-11-21: 20 ug/min via INTRAVENOUS

## 2015-11-21 MED ORDER — METOCLOPRAMIDE HCL 5 MG/ML IJ SOLN: 5.0000 mg | Freq: Three times a day (TID) | INTRAMUSCULAR | Status: DC | PRN

## 2015-11-21 MED ORDER — METHOCARBAMOL 500 MG PO TABS: 500.0000 mg | ORAL_TABLET | Freq: Four times a day (QID) | ORAL | Status: DC | PRN

## 2015-11-21 MED ORDER — DEXTROSE 50 % IV SOLN
INTRAVENOUS | Status: AC
  Filled 2015-11-21: qty 50

## 2015-11-21 SURGICAL SUPPLY — 43 items
BLADE SAW RECIP 87.9 MT (BLADE) ×3 IMPLANT
BNDG COHESIVE 6X5 TAN STRL LF (GAUZE/BANDAGES/DRESSINGS) ×2 IMPLANT
BNDG GAUZE ELAST 4 BULKY (GAUZE/BANDAGES/DRESSINGS) ×1 IMPLANT
COVER SURGICAL LIGHT HANDLE (MISCELLANEOUS) ×4 IMPLANT
CUFF TOURNIQUET SINGLE 34IN LL (TOURNIQUET CUFF) IMPLANT
CUFF TOURNIQUET SINGLE 44IN (TOURNIQUET CUFF) IMPLANT
DRAIN PENROSE 1/2X12 LTX STRL (WOUND CARE) IMPLANT
DRAPE EXTREMITY T 121X128X90 (DRAPE) ×3 IMPLANT
DRAPE PROXIMA HALF (DRAPES) ×3 IMPLANT
DRAPE U-SHAPE 47X51 STRL (DRAPES) ×6 IMPLANT
DRSG ADAPTIC 3X8 NADH LF (GAUZE/BANDAGES/DRESSINGS) ×3 IMPLANT
DRSG PAD ABDOMINAL 8X10 ST (GAUZE/BANDAGES/DRESSINGS) ×2 IMPLANT
DURAPREP 26ML APPLICATOR (WOUND CARE) ×3 IMPLANT
ELECT CAUTERY BLADE 6.4 (BLADE) IMPLANT
ELECT REM PT RETURN 9FT ADLT (ELECTROSURGICAL) ×3
ELECTRODE REM PT RTRN 9FT ADLT (ELECTROSURGICAL) ×1 IMPLANT
EVACUATOR 1/8 PVC DRAIN (DRAIN) IMPLANT
GAUZE SPONGE 4X4 12PLY STRL (GAUZE/BANDAGES/DRESSINGS) ×1 IMPLANT
GLOVE BIOGEL PI IND STRL 9 (GLOVE) ×1 IMPLANT
GLOVE BIOGEL PI INDICATOR 9 (GLOVE) ×2
GLOVE SURG ORTHO 9.0 STRL STRW (GLOVE) ×3 IMPLANT
GOWN STRL REUS W/ TWL XL LVL3 (GOWN DISPOSABLE) ×2 IMPLANT
GOWN STRL REUS W/TWL XL LVL3 (GOWN DISPOSABLE) ×6
KIT BASIN OR (CUSTOM PROCEDURE TRAY) ×3 IMPLANT
KIT PREVENA INCISION MGT 13 (CANNISTER) ×4 IMPLANT
KIT ROOM TURNOVER OR (KITS) ×3 IMPLANT
MANIFOLD NEPTUNE II (INSTRUMENTS) ×3 IMPLANT
NS IRRIG 1000ML POUR BTL (IV SOLUTION) ×3 IMPLANT
PACK GENERAL/GYN (CUSTOM PROCEDURE TRAY) ×3 IMPLANT
PAD ARMBOARD 7.5X6 YLW CONV (MISCELLANEOUS) ×3 IMPLANT
STAPLER VISISTAT 35W (STAPLE) IMPLANT
STOCKINETTE IMPERVIOUS LG (DRAPES) ×4 IMPLANT
SUT ETHILON 2 0 PSLX (SUTURE) ×3 IMPLANT
SUT PDS AB 1 CT  36 (SUTURE)
SUT PDS AB 1 CT 36 (SUTURE) IMPLANT
SUT SILK 2 0 (SUTURE) ×3
SUT SILK 2-0 18XBRD TIE 12 (SUTURE) ×1 IMPLANT
SUT VIC AB 1 CTX 27 (SUTURE) ×3 IMPLANT
SWAB COLLECTION DEVICE MRSA (MISCELLANEOUS) IMPLANT
TOWEL OR 17X24 6PK STRL BLUE (TOWEL DISPOSABLE) ×3 IMPLANT
TOWEL OR 17X26 10 PK STRL BLUE (TOWEL DISPOSABLE) ×3 IMPLANT
TUBE ANAEROBIC SPECIMEN COL (MISCELLANEOUS) IMPLANT
WATER STERILE IRR 1000ML POUR (IV SOLUTION) ×1 IMPLANT

## 2015-11-21 NOTE — Op Note (Signed)
11/15/2015 - 11/21/2015  5:32 PM  PATIENT:  [name]    PRE-OPERATIVE DIAGNOSIS:  Bilateral Leg Gangrene  POST-OPERATIVE DIAGNOSIS:  Same  PROCEDURE:  AMPUTATION ABOVE KNEE bilateral Application of Prevena wound VAC 2  SURGEON:  Newt Minion, MD  PHYSICIAN ASSISTANT:None ANESTHESIA:   General  PREOPERATIVE INDICATIONS:  [name] is a  82 y.o. female with a diagnosis of Bilateral Leg Gangrene who failed conservative measures and elected for surgical management.    The risks benefits and alternatives were discussed with the patient preoperatively including but not limited to the risks of infection, bleeding, nerve injury, cardiopulmonary complications, the need for revision surgery, among others, and the patient was willing to proceed.  OPERATIVE IMPLANTS: Prevena wound VAC 2  OPERATIVE FINDINGS: Calcified femoral vessels  OPERATIVE PROCEDURE: Patient was brought to the operating room and underwent a general anesthetic. After adequate levels anesthesia obtained patient's both lower extremities were prepped using DuraPrep draped into a sterile field the legs were wrapped out of the sterile field with impervious stockinette. A timeout was called. Attention was first focused on the left side. A fishmouth incision was made proximal to the patella. This was carried sharply down to bone the bone was resected. The medial femoral vessels were suture ligated with 2-0 silk the sciatic nerve was pulled cut and allowed to retract and the amputation was completed. Electrocautery was used for hemostasis. The fascial layers and skin was closed using 2-0 nylon. A Prevena wound VAC was applied this had a good suction fit. Attention was then focused on the right lower extremity a fishmouth incision was made just proximal to the patella this was carried sharply down to the medial vascular bundles these were clamped the femur was resected with a reciprocating saw the remainder of the flap was  developed. The vascular bundles were suture ligated with 2-0 silk the sciatic nerve was pulled cut and allowed to retract. The deep and superficial fascial layers and skin was closed using 2-0 nylon. A Prevena wound VAC was applied this had a good suction fit patient was extubated taken to the PACU in stable condition.

## 2015-11-21 NOTE — Transfer of Care (Signed)
Immediate Anesthesia Transfer of Care Note  Patient: [name]  Procedure(s) Performed: Procedure(s): AMPUTATION ABOVE KNEE (Bilateral)  Patient Location: PACU  Anesthesia Type:General  Level of Consciousness: lethargic and responds to stimulation  Airway & Oxygen Therapy: Patient Spontanous Breathing and Patient connected to nasal cannula oxygen  Post-op Assessment: Report given to RN  Post vital signs: Reviewed and stable  Last Vitals:  Vitals:   11/21/15 1043 11/21/15 1123  BP: (!) 138/59 (!) 140/43  Pulse: 62 60  Resp: 16 18  Temp: 36.7 C 37.1 C    Last Pain:  Vitals:   11/21/15 1123  TempSrc: Oral  PainSc:       Patients Stated Pain Goal: 1 ([id number])  Complications: No apparent anesthesia complications

## 2015-11-21 NOTE — Anesthesia Postprocedure Evaluation (Signed)
Anesthesia Post Note  Patient: [name]  Procedure(s) Performed: Procedure(s) (LRB): AMPUTATION ABOVE KNEE (Bilateral)  Patient location during evaluation: PACU Anesthesia Type: General Level of consciousness: awake Pain management: pain level controlled Vital Signs Assessment: post-procedure vital signs reviewed and stable Respiratory status: spontaneous breathing Cardiovascular status: stable Postop Assessment: no signs of nausea or vomiting Anesthetic complications: no    Last Vitals:  Vitals:   11/21/15 1123 11/21/15 1745  BP: (!) 140/43 (!) 134/49  Pulse: 60 (!) 58  Resp: 18 18  Temp: 37.1 C 36.6 C    Last Pain:  Vitals:   11/21/15 1123  TempSrc: Oral  PainSc:                  [name]

## 2015-11-21 NOTE — Interval H&P Note (Signed)
History and Physical Interval Note:  11/21/2015 6:58 AM  [name]  has presented today for surgery, with the diagnosis of Bilateral Leg Gangrene  The various methods of treatment have been discussed with the patient and family. After consideration of risks, benefits and other options for treatment, the patient has consented to  Procedure(s): AMPUTATION ABOVE KNEE (Bilateral) as a surgical intervention .  The patient's history has been reviewed, patient examined, no change in status, stable for surgery.  I have reviewed the patient's chart and labs.  Questions were answered to the patient's satisfaction.     Newt Minion

## 2015-11-21 NOTE — Progress Notes (Signed)
PROGRESS NOTE                                                                                                                                                                                                             Patient Demographics:    [name], is a 82 y.o. female, DOB - [birth year], [id number]  Admit date - 11/15/2015   Admitting Physician Kinnie Feil, MD  Outpatient Primary MD for the patient is [name], MD  LOS - 5   Chief Complaint  Patient presents with  . graft bleeding       Brief Narrative   82 y.o.femalewith a medical history significant for, but not limited to,hypertension, diabetes, PAD, and end-stage renal disease on hemodialysis. Admitted for bleeding from her dialysis graft in left arm. Doppler study negative for DVT, s/p placement of new right tunneld fem cath and new left upper AVGG , Shunt with chronic right heel ulcer and necrotic toes bilaterally, seen by Dr. Sharol Given, with recommendation for above-knee amputation, initially refusing, but currently Agreeable to surgery   Subjective:    [name] today has, No headache, No chest pain, No abdominal pain , Complains of right leg pain   Assessment  & Plan :    Principal Problem:   Bleeding from dialysis shunt [facility]) Active Problems:   ESRD (end stage renal disease) on dialysis (HCC)   Hypertension   Acute post-hemorrhagic anemia   Incontinence of feces   Pressure ulcer   Absolute anemia   Osteomyelitis ([place])   Protein-calorie malnutrition, severe   Bleeding from left AV graft - Vascular surgery consult greatly appreciated, status post thrombectomy and revision of AV graft, and placement of right femoral dialysis catheter, can use her left arm graft in 3 weeks per vascular surgery  Anemia of acute blood loss - Glycohemoglobin 6.5 in admission, Required 3 units PRBC transfusions so far, most recent 2 units on 9/13 given significant anemia  with hemoglobin of 7.6 and anticipation for surgery.Albumin is 13.1 today.  Severe peripheral vascular disease with dry gangrene of both feet - Orthopedic consult greatly appreciated, recommendation is for above-knee amputation, patient initially refusing, but currently agreeable to it , discussed with patient HCPOA Ms [name] on 9/14, she discussed with Dr. Sharol Given, agreeable to surgery, land for surgery this afternoon. - With evidence of osteoarthritis  on foot x-ray, Continue with IV vancomycin and Zosyn  History of breast cancer - status post total mastectomy for right-sided invasive ductal carcinoma in April 2015. Also has remote history of left breast cancer. Continue Arimidex  Hypertension - Continue with Toprol-XL  ESRD - HD per renal.  Hypoglycemia Patient with significant hypoglycemia today, CBGs in the 30s and 40s, and she remains symptomatic, discontinued insulin sliding scale, and discontinued carb modified diet.   Severe protein calorie malnutrition - continue with supplements  Code Status : Patial  Family Communication  : D/W patient,D/W HCPOA via phone Disposition Plan  : Pending further workup  Consults  :  Renal, vascular surgery, orthopedic  Procedures  :   Thrombectomy and revision of AV graft , insertion of right femoral dialysis catheter DVT Prophylaxis  :SCDs   Lab Results  Component Value Date   PLT 103 (L) 11/21/2015    Antibiotics  :   Anti-infectives    Start     Dose/Rate Route Frequency Ordered Stop   11/21/15 1500  ceFAZolin (ANCEF) IVPB 2g/100 mL premix     2 g 200 mL/hr over 30 Minutes Intravenous To ShortStay Surgical 11/20/15 1202 11/22/15 1500   11/21/15 0749  vancomycin (VANCOCIN) 500-5 MG/100ML-% IVPB  Status:  Discontinued    Comments:  Serrano, Salvador   : cabinet override      11/21/15 0749 11/21/15 1012   11/19/15 1200  vancomycin (VANCOCIN) 500 mg in sodium chloride 0.9 % 100 mL IVPB     500 mg 100 mL/hr over 60 Minutes  Intravenous Every M-W-F (Hemodialysis) 11/17/15 1149     11/17/15 1215  piperacillin-tazobactam (ZOSYN) IVPB 3.375 g     3.375 g 12.5 mL/hr over 240 Minutes Intravenous Every 12 hours 11/17/15 1149     11/17/15 1200  vancomycin (VANCOCIN) IVPB 1000 mg/200 mL premix     1,000 mg 200 mL/hr over 60 Minutes Intravenous Every M-W-F (Hemodialysis) 11/17/15 1149 11/17/15 1725        Objective:   Vitals:   11/21/15 0957 11/21/15 1030 11/21/15 1043 11/21/15 1123  BP: (!) 114/54 (!) 135/47 (!) 138/59 (!) 140/43  Pulse: 61 65 62 60  Resp:   16 18  Temp:   98.1 F (36.7 C) 98.7 F (37.1 C)  TempSrc:   Oral Oral  SpO2:    94%  Weight:   48.4 kg (106 lb 11.2 oz)   Height:        Wt Readings from Last 3 Encounters:  11/21/15 48.4 kg (106 lb 11.2 oz)  11/04/15 46.7 kg (103 lb)  10/27/15 46.7 kg (103 lb)     Intake/Output Summary (Last 24 hours) at 11/21/15 1318 Last data filed at 11/21/15 1125  Gross per 24 hour  Intake              240 ml  Output             1000 ml  Net             -760 ml     Physical Exam  Awake Alert, Comfortable Supple Neck,No JVD, Symmetrical Chest wall movement, Good air movement bilaterally, CTAB RRR,No Gallops,Rubs or new Murmurs, No Parasternal Heave +ve B.Sounds, Abd Soft, No tenderness,No rebound - guarding or rigidity. No pedal pulses. No LE edema. Right heel ulcer, foul-smelling discharge from right foot    Data Review:    CBC  Recent Labs Lab 11/15/15 1017  11/16/15 0343 11/16/15 1157 11/17/15 1300 11/19/15  0703 11/21/15 0542  WBC 10.5  < > 13.1* 14.5* 15.3* 14.4* 15.6*  HGB 8.9*  < > 6.5* 8.5* 8.1* 7.6* 13.1  HCT 28.3*  < > 20.1* 25.9* 24.9* 23.4* 39.3  PLT 202  < > 163 142* 149* 123* 103*  MCV 93.1  < > 92.6 91.8 91.2 92.9 91.2  MCH 29.3  < > 30.0 30.1 29.7 30.2 30.4  MCHC 31.4  < > 32.3 32.8 32.5 32.5 33.3  RDW 15.2  < > 15.0 14.6 14.6 14.6 15.9*  LYMPHSABS 1.3  --   --   --   --   --   --   MONOABS 0.8  --   --   --   --    --   --   EOSABS 0.0  --   --   --   --   --   --   BASOSABS 0.0  --   --   --   --   --   --   < > = values in this interval not displayed.  Chemistries   Recent Labs Lab 11/15/15 1017 11/16/15 0343 11/17/15 1300  11/19/15 0702 11/19/15 1649 11/20/15 0833 11/20/15 1644 11/21/15 0542  NA 136 134* 133*  --  132*  --   --   --  133*  K 3.6 3.9 4.7  --  3.6  --   --   --  3.6  CL 97* 100* 95*  --  95*  --   --   --  95*  CO2 30 28 29   --  30  --   --   --  27  GLUCOSE 115* 143* 81  < > 67 67 31* 104* 70  BUN 9 14 20   --  14  --   --   --  16  CREATININE 2.52* 3.06* 4.10*  --  2.87*  --   --   --  2.63*  CALCIUM 9.8 9.5 9.9  --  9.6  --   --   --  9.8  AST 25  --   --   --   --   --   --   --   --   ALT 14  --   --   --   --   --   --   --   --   ALKPHOS 76  --   --   --   --   --   --   --   --   BILITOT 0.6  --   --   --   --   --   --   --   --   < > = values in this interval not displayed. ------------------------------------------------------------------------------------------------------------------ No results for input(s): CHOL, HDL, LDLCALC, TRIG, CHOLHDL, LDLDIRECT in the last 72 hours.  Lab Results  Component Value Date   HGBA1C 5.2 11/17/2015   ------------------------------------------------------------------------------------------------------------------ No results for input(s): TSH, T4TOTAL, T3FREE, THYROIDAB in the last 72 hours.  Invalid input(s): FREET3 ------------------------------------------------------------------------------------------------------------------ No results for input(s): VITAMINB12, FOLATE, FERRITIN, TIBC, IRON, RETICCTPCT in the last 72 hours.  Coagulation profile  Recent Labs Lab 11/15/15 1017  INR 1.04    No results for input(s): DDIMER in the last 72 hours.  Cardiac Enzymes No results for input(s): CKMB, TROPONINI, MYOGLOBIN in the last 168 hours.  Invalid input(s):  CK ------------------------------------------------------------------------------------------------------------------ No results found for: BNP  Inpatient Medications  Scheduled Meds: . sodium chloride  Intravenous Once  . sodium chloride   Intravenous Once  . sodium chloride   Intravenous Once  . sodium chloride   Intravenous Once  . anastrozole  1 mg Oral QPC supper  .  ceFAZolin (ANCEF) IV  2 g Intravenous To SS-Surg  . chlorhexidine  60 mL Topical Once  . cinacalcet  60 mg Oral Q supper  . darbepoetin (ARANESP) injection - DIALYSIS  100 mcg Intravenous Q Mon-HD  . feeding supplement (NEPRO CARB STEADY)  237 mL Oral BID BM  . feeding supplement (PRO-STAT SUGAR FREE 64)  30 mL Oral BID  . metoprolol succinate  50 mg Oral QPC supper  . multivitamin  1 tablet Oral QHS  . piperacillin-tazobactam (ZOSYN)  IV  3.375 g Intravenous Q12H  . sodium chloride flush  3 mL Intravenous Q12H  . vancomycin  500 mg Intravenous Q M,W,F-HD   Continuous Infusions:  PRN Meds:.ondansetron **OR** ondansetron (ZOFRAN) IV, oxyCODONE-acetaminophen  Micro Results Recent Results (from the past 240 hour(s))  MRSA PCR Screening     Status: None   Collection Time: 11/16/15  9:46 AM  Result Value Ref Range Status   MRSA by PCR NEGATIVE NEGATIVE Final    Comment:        The GeneXpert MRSA Assay (FDA approved for NASAL specimens only), is one component of a comprehensive MRSA colonization surveillance program. It is not intended to diagnose MRSA infection nor to guide or monitor treatment for MRSA infections.   Surgical PCR screen     Status: None   Collection Time: 11/20/15  5:04 PM  Result Value Ref Range Status   MRSA, PCR NEGATIVE NEGATIVE Final   Staphylococcus aureus NEGATIVE NEGATIVE Final    Comment:        The Xpert SA Assay (FDA approved for NASAL specimens in patients over 10 years of age), is one component of a comprehensive surveillance program.  Test performance has been  validated by [facility] for patients greater than or equal to 52 year old. It is not intended to diagnose infection nor to guide or monitor treatment.     Radiology Reports Dg Chest Port 1 View  Result Date: 11/15/2015 CLINICAL DATA:  Status post dialysis catheter insertion through the right groin. EXAM: PORTABLE CHEST 1 VIEW COMPARISON:  12/27/2013 FINDINGS: Large bore catheter is seen from inferior approach with tip overlying the expected location of the proximal right atrium. Vascular stent in the region of the left brachiocephalic vein is stable. Cardiomediastinal silhouette is normal. Mediastinal contours appear intact. There is no evidence of focal airspace consolidation, pleural effusion or pneumothorax. Osseous structures are without acute abnormality. Soft tissues are grossly normal. IMPRESSION: No active disease. Large bore catheter from the inferior approach terminates in the expected location of proximal right atrium. Electronically Signed   By: Fidela Salisbury M.D.   On: 11/15/2015 16:52   Dg Abd Portable 1v  Result Date: 11/15/2015 CLINICAL DATA:  Change in bowel habits EXAM: PORTABLE ABDOMEN - 1 VIEW COMPARISON:  03/24/2012 FINDINGS: There is moderate gaseous distension of the lower abdominal and pelvic bowel loops. Average volume of stool noted within the colon. No dilated loops of small bowel identified. Right groin dialysis catheter is identified. The tip is within the cavoatrial junction. IMPRESSION: 1. Nonobstructive bowel gas pattern. 2. The dialysis catheter tip is in the cavoatrial junction Electronically Signed   By: Kerby Moors M.D.   On: 11/15/2015 16:40   Dg Foot Complete Left  Result Date: 11/16/2015  CLINICAL DATA:  Osteomyelitis.  Left foot pain. EXAM: LEFT FOOT - COMPLETE 3+ VIEW COMPARISON:  None. FINDINGS: There is irregularity of the distal left first toe soft tissues. There is absence of the distal portion of the distal phalanx in the left first toe with  associated erosive change at the distal osseous margin. There are multiple cortical erosions in the middle and proximal phalanges of the left fourth toe, with potential postsurgical changes in the proximal phalanx of the left fourth toe. Diffuse osteopenia. No fracture or dislocation. No suspicious focal osseous lesion. Diffuse vascular calcifications. No appreciable soft tissue gas. No periosteal reaction. IMPRESSION: 1. Absence of the distal portion of the distal phalanx in the left first toe with associated erosive change at the distal osseous margin and overlying soft tissue irregularity, suggesting acute osteomyelitis, potentially at the site of prior amputation, correlate with surgical history. 2. Multiple cortical erosions in the left fourth toe suggestive of acute osteomyelitis with potential postsurgical changes in the proximal phalanx of the left fourth toe, correlate with surgical history. 3. Diffuse osteopenia. Electronically Signed   By: Ilona Sorrel M.D.   On: 11/16/2015 18:00   Dg Foot Complete Right  Result Date: 11/16/2015 CLINICAL DATA:  Osteomyelitis EXAM: RIGHT FOOT COMPLETE - 3+ VIEW COMPARISON:  None. FINDINGS: Three views of the right foot submitted. There is diffuse osteopenia. The patient is status post amputation of first and second toes. There is diffuse osteopenia. No definite evidence of bone destruction to suggest osteomyelitis. Extensive atherosclerotic vascular calcifications are noted. No acute fracture or subluxation IMPRESSION: No acute fracture or subluxation. Diffuse osteopenia. Status post amputation of first and second toes. No definite evidence of bone destruction to suggest osteomyelitis. Electronically Signed   By: Lahoma Crocker M.D.   On: 11/16/2015 17:51   Dg Fluoro Guide Cv Line-no Report  Result Date: 11/15/2015 CLINICAL DATA:  FLOURO GUIDE CV LINE Fluoroscopy was utilized by the requesting physician.  No radiographic interpretation.     [facility], Zaim Nitta M.D on  11/21/2015 at 1:18 PM  Between 7am to 7pm - Pager - [phone]  After 7pm go to [email or web address] - password [facility]  [facility] -  Office  [phone]

## 2015-11-21 NOTE — Procedures (Signed)
I have seen and examined this patient and agree with the plan of care .  Appears to be doing well on dialysis and prepared for amputation.  [name] W 11/21/2015, 12:15 PM

## 2015-11-21 NOTE — Anesthesia Preprocedure Evaluation (Addendum)
Anesthesia Evaluation  Patient identified by MRN, date of birth, ID band Patient awake    Reviewed: Allergy & Precautions, NPO status , Patient's Chart, lab work & pertinent test results  History of Anesthesia Complications Negative for: history of anesthetic complications  Airway Mallampati: I  TM Distance: >3 FB Neck ROM: Full    Dental  (+) Edentulous Upper, Edentulous Lower, Dental Advisory Given   Pulmonary former smoker,    breath sounds clear to auscultation       Cardiovascular hypertension, Pt. on medications + Peripheral Vascular Disease   Rhythm:Regular Rate:Normal     Neuro/Psych    GI/Hepatic (+) Hepatitis -, C  Endo/Other  diabetes  Renal/GU DialysisRenal disease     Musculoskeletal  (+) Arthritis ,   Abdominal   Peds  Hematology  (+) anemia ,   Anesthesia Other Findings   Reproductive/Obstetrics                            Anesthesia Physical Anesthesia Plan  ASA: III  Anesthesia Plan: General   Post-op Pain Management:    Induction: Intravenous  Airway Management Planned: Oral ETT  Additional Equipment: None  Intra-op Plan:   Post-operative Plan: Extubation in OR  Informed Consent: I have reviewed the patients History and Physical, chart, labs and discussed the procedure including the risks, benefits and alternatives for the proposed anesthesia with the patient or authorized representative who has indicated his/her understanding and acceptance.   Dental advisory given  Plan Discussed with: CRNA and Surgeon  Anesthesia Plan Comments:         Anesthesia Quick Evaluation

## 2015-11-21 NOTE — H&P (View-Only) (Signed)
ORTHOPAEDIC CONSULTATION  REQUESTING PHYSICIAN: Oswald Hillock, MD  Chief Complaint: Painful dry gangrene both feet  HPI: Heather ATEHORTUA is a 82 y.o. female who presents with diabetic insensate neuropathy end-stage renal disease on dialysis with severe peripheral vascular disease with chronic gangrene of both feet.  Past Medical History:  Diagnosis Date  . Anemia   . Arthritis   . Bilateral breast cancer ([place])   . DM (diabetes mellitus) ([place])   . Dyslipidemia   . ESRD (end stage renal disease) on dialysis ([place])    a. [place]: MWF (07/10/2014)  . Hepatitis C   . Hyperparathyroidism   . Hypertension   . PVD (peripheral vascular disease) ([place])    a. s/p peripheral angiogram on 07/10/14 with no PCI and cont med Rx  . Vascular disease    Past Surgical History:  Procedure Laterality Date  . ABDOMINAL HYSTERECTOMY    . APPENDECTOMY    . ARTERIOVENOUS GRAFT PLACEMENT Left 03/20/09   "had right arm previously but couldn't use it anymore"  . BACK SURGERY    . BREAST BIOPSY Bilateral   . BREAST BIOPSY Right 08/08/2013   Procedure: Evacuation Hematoma Right Chest;  Surgeon: [name], MD;  Location: [facility];  Service: General;  Laterality: Right;  . CATARACT EXTRACTION Right   . DG AV DIALYSIS GRAFT DECLOT OR Left 01/27/11, 02/15/11, 03/15/11, 10/13/11   lua  . EVACUATION BREAST HEMATOMA  2001; 2015   left; right  . INSERTION OF DIALYSIS CATHETER Right 11/15/2015   Procedure: INSERTION OF Right Femoral  DIALYSIS CATHETER.;  Surgeon: Rosetta Posner, MD;  Location: [facility];  Service: Vascular;  Laterality: Right;  . MASTECTOMY Left 1970's?  Marland Kitchen MASTECTOMY COMPLETE / SIMPLE Right 08/07/2013  . MEDIAN NERVE REPAIR Left    "decompression"  . PERIPHERAL VASCULAR CATHETERIZATION N/A 07/10/2014   Procedure: Abdominal Aortogram;  Surgeon: Wellington Hampshire, MD;  Location: [facility] INVASIVE CV LAB CUPID;  Service: Cardiovascular;  Laterality: N/A;  . POSTERIOR FUSION CERVICAL SPINE     "have 6 screws"  .  THROMBECTOMY AND REVISION OF ARTERIOVENTOUS (AV) GORETEX  GRAFT Left 04/18/2009  . THROMBECTOMY AND REVISION OF ARTERIOVENTOUS (AV) GORETEX  GRAFT Left 11/15/2015   Procedure: Thrombectomy and Revision of Left ARm AV Gortex Graft.;  Surgeon: Rosetta Posner, MD;  Location: [facility];  Service: Vascular;  Laterality: Left;  . TOE AMPUTATION Right    1,2nd toes  . TONSILLECTOMY    . TOTAL MASTECTOMY Right 08/07/2013   Procedure: RIGHT TOTAL MASTECTOMY;  Surgeon: [name], MD;  Location: [facility];  Service: General;  Laterality: Right;  . UTERINE FIBROID SURGERY     Social History   Social History  . Marital status: Widowed    Spouse name: N/A  . Number of children: N/A  . Years of education: N/A   Social History Main Topics  . Smoking status: Former Smoker    Types: Cigarettes  . Smokeless tobacco: Never Used     Comment: "stopped smoking in 1969"  . Alcohol use 0.0 oz/week     Comment: "no alcohol since 1969"  . Drug use: No  . Sexual activity: No   Other Topics Concern  . None   Social History Narrative  . None   Family History  Problem Relation Age of Onset  . Cancer Mother   . Hypertension Mother    - negative except otherwise stated in the family history section Allergies  Allergen Reactions  . Aspirin Other (See Comments)    NO BLOOD THINNERS OF ANY KIND-bleeding events   Prior to Admission medications   Medication Sig Start Date End Date Taking? Authorizing Provider  anastrozole (ARIMIDEX) 1 MG tablet Take 1 tablet (1 mg total) by mouth daily. Patient taking differently: Take 1 mg by mouth daily after supper.  06/17/15  Yes Wyatt Portela, MD  B Complex-C-Folic Acid (DIALYVITE TABLET) TABS Take 1 tablet by mouth daily after supper. 10/31/15  Yes Historical Provider, MD  cinacalcet (SENSIPAR) 60 MG tablet Take 60 mg by mouth daily after supper.   Yes Historical Provider, MD  ibuprofen (ADVIL,MOTRIN) 200 MG tablet Take 400 mg by mouth every 6 (six) hours as needed for  fever, headache or mild pain.    Yes Historical Provider, MD  lanthanum (FOSRENOL) 1000 MG chewable tablet Chew 1,000 mg by mouth daily after supper.    Yes Historical Provider, MD  metoprolol succinate (TOPROL-XL) 50 MG 24 hr tablet Take 50 mg by mouth daily after supper. Take with or immediately following a meal.    Yes Historical Provider, MD  traMADol (ULTRAM) 50 MG tablet Take 1 tablet (50 mg total) by mouth every 12 (twelve) hours as needed for moderate pain. 10/27/15  Yes Davonna Belling, MD   Dg Foot Complete Left  Result Date: 11/16/2015 CLINICAL DATA:  Osteomyelitis.  Left foot pain. EXAM: LEFT FOOT - COMPLETE 3+ VIEW COMPARISON:  None. FINDINGS: There is irregularity of the distal left first toe soft tissues. There is absence of the distal portion of the distal phalanx in the left first toe with associated erosive change at the distal osseous margin. There are multiple cortical erosions in the middle and proximal phalanges of the left fourth toe, with potential postsurgical changes in the proximal phalanx of the left fourth toe. Diffuse osteopenia. No fracture or dislocation. No suspicious focal osseous lesion. Diffuse vascular calcifications. No appreciable soft tissue gas. No periosteal reaction. IMPRESSION: 1. Absence of the distal portion of the distal phalanx in the left first toe with associated erosive change at the distal osseous margin and overlying soft tissue irregularity, suggesting acute osteomyelitis, potentially at the site of prior amputation, correlate with surgical history. 2. Multiple cortical erosions in the left fourth toe suggestive of acute osteomyelitis with potential postsurgical changes in the proximal phalanx of the left fourth toe, correlate with surgical history. 3. Diffuse osteopenia. Electronically Signed   By: Ilona Sorrel M.D.   On: 11/16/2015 18:00   Dg Foot Complete Right  Result Date: 11/16/2015 CLINICAL DATA:  Osteomyelitis EXAM: RIGHT FOOT COMPLETE - 3+  VIEW COMPARISON:  None. FINDINGS: Three views of the right foot submitted. There is diffuse osteopenia. The patient is status post amputation of first and second toes. There is diffuse osteopenia. No definite evidence of bone destruction to suggest osteomyelitis. Extensive atherosclerotic vascular calcifications are noted. No acute fracture or subluxation IMPRESSION: No acute fracture or subluxation. Diffuse osteopenia. Status post amputation of first and second toes. No definite evidence of bone destruction to suggest osteomyelitis. Electronically Signed   By: Lahoma Crocker M.D.   On: 11/16/2015 17:51   - pertinent xrays, CT, MRI studies were reviewed and independently interpreted  Positive ROS: All other systems have been reviewed and were otherwise negative with the exception of those mentioned in the HPI and as above.  Physical Exam: General: Alert, no acute distress Psychiatric: Patient is competent for consent with normal mood and affect Lymphatic: No  axillary or cervical lymphadenopathy Cardiovascular: No pedal edema Respiratory: No cyanosis, no use of accessory musculature GI: No organomegaly, abdomen is soft and non-tender  Skin: Patient's feet are cold to the touch she does not have a palpable dorsalis pedis pulse or feet are cold her toes are mummified. She has ischemic changes to the hindfoot and forefoot.   Neurologic: Patient does not have protective sensation bilateral lower extremities.   MUSCULOSKELETAL:  Examination patient's both lower extremities she has severe peripheral vascular disease with contractures of her knees she is nonambulatory she has cold lower extremities and arteriogram studies recently have shown no revascularization options.  Assessment: Assessment: Diabetic insensate neuropathy end-stage renal disease with severe peripheral vascular disease with dry gangrene of both feet which is currently not painful.  Plan: Plan: Discussed with the patient that her  only option would be an above-the-knee amputation and this may not heal. There would be no benefit of preserving her knees with knee flexion contractures with the patient's inability to ambulate. Patient is strongly opposed amputation at this time I will sign off, if patient does develop infection or develops increased pain would need to reevaluate for bilateral above-the-knee amputations.  Thank you for the consult and the opportunity to see Ms. Marlou Sa, [place] [phone] 7:04 AM

## 2015-11-21 NOTE — Anesthesia Procedure Notes (Signed)
Procedure Name: Intubation Date/Time: 11/21/2015 4:45 PM Performed by: Sampson Si E Pre-anesthesia Checklist: Patient identified, Emergency Drugs available, Suction available and Patient being monitored Patient Re-evaluated:Patient Re-evaluated prior to inductionOxygen Delivery Method: Circle System Utilized Preoxygenation: Pre-oxygenation with 100% oxygen Intubation Type: IV induction Ventilation: Mask ventilation without difficulty Laryngoscope Size: Mac and 3 Grade View: Grade I Tube type: Oral Tube size: 7.0 mm Number of attempts: 1 Airway Equipment and Method: Stylet and Oral airway Placement Confirmation: ETT inserted through vocal cords under direct vision,  positive ETCO2 and breath sounds checked- equal and bilateral Secured at: 21 cm Tube secured with: Tape Dental Injury: Teeth and Oropharynx as per pre-operative assessment

## 2015-11-22 LAB — GLUCOSE, CAPILLARY
GLUCOSE-CAPILLARY: 74 mg/dL (ref 65–99)
GLUCOSE-CAPILLARY: 111 mg/dL — AB (ref 65–99)
GLUCOSE-CAPILLARY: 92 mg/dL (ref 65–99)
Glucose-Capillary: 94 mg/dL (ref 65–99)
Glucose-Capillary: 118 mg/dL — ABNORMAL HIGH (ref 65–99)

## 2015-11-22 MED ORDER — HEPARIN SODIUM (PORCINE) 5000 UNIT/ML IJ SOLN
5000.0000 [IU] | Freq: Two times a day (BID) | INTRAMUSCULAR | Status: DC
  Administered 2015-11-22 – 2015-11-24 (×5): 5000 [IU] via SUBCUTANEOUS
  Filled 2015-11-22 (×5): qty 1

## 2015-11-22 NOTE — Progress Notes (Signed)
Patient ID: [name], female   DOB: 06-06-1932, 82 y.o.   MRN: [id number] Postoperative day 1 bilateral above-knee amputations. Patient is comfortable this morning she states she feels much better than she did yesterday. Wound vacs are functioning well canister to be changed this morning. Patient is safe for discharge from an orthopedic standpoint she may be discharged with the wound vacs.

## 2015-11-22 NOTE — Progress Notes (Signed)
Pt R AKA Prevana 125 noted to have two beeps and an indicator light that is staying on. It is noted that the unit canister is full. Page to On-call for Dr. Sharol Given for notification that patient is not receiving therapy at this time due to full canister. No canisters are present in patients room to change device. Awaiting response. Meryl Crutch, RN

## 2015-11-22 NOTE — Progress Notes (Signed)
PT Cancellation Note  Patient Details Name: [name] MRN: [id number] DOB: [birth year]   Cancelled Treatment:    Reason Eval/Treat Not Completed: Medical issues which prohibited therapy.  Two day history of extremely low diastolic BP's and will not be able to mobilize until stable.  Will try later as time and pt allow.   Ramond Dial 11/22/2015, 9:51 AM    Mee Hives, PT MS Acute Rehab Dept. Number: [place] [facility] [place]

## 2015-11-22 NOTE — Progress Notes (Signed)
PROGRESS NOTE                                                                                                                                                                                                             Patient Demographics:    [name], is a 82 y.o. female, DOB - Jul 20, 1932, [id number]  Admit date - 11/15/2015   Admitting Physician Kinnie Feil, MD  Outpatient Primary MD for the patient is [name], MD  LOS - 6   Chief Complaint  Patient presents with  . graft bleeding       Brief Narrative   82 y.o.femalewith a medical history significant for, but not limited to,hypertension, diabetes, PAD, and end-stage renal disease on hemodialysis. Admitted for bleeding from her dialysis graft in left arm. Doppler study negative for DVT, s/p placement of new right tunneld fem cath and new left upper AVGG , Shunt with chronic right heel ulcer and necrotic toes bilaterally, seen by Dr. Sharol Given, She underwent bilateral above-knee amputation on 9/15.   Subjective:    [name] today has, No headache, No chest pain, No abdominal pain , Complains of Lower back pain   Assessment  & Plan :    Principal Problem:   Bleeding from dialysis shunt [facility]) Active Problems:   ESRD (end stage renal disease) on dialysis (HCC)   Hypertension   Acute post-hemorrhagic anemia   Incontinence of feces   Pressure ulcer   Absolute anemia   Osteomyelitis (HCC)   Protein-calorie malnutrition, severe   Bleeding from left AV graft - Vascular surgery consult greatly appreciated, status post thrombectomy and revision of AV graft, and placement of right femoral dialysis catheter, can use her left arm graft in 3 weeks per vascular surgery  Anemia of acute blood loss - Glycohemoglobin 6.5 in admission, Required 3 units PRBC transfusions so far, most recent 2 units on 9/13 given significant anemia with hemoglobin of 7.6 and anticipation for  surgery.  Severe peripheral vascular disease with dry gangrene of both feet/With osteomyelitis - Orthopedic consult greatly appreciated, recommendation is for above-knee amputation, patient initially refusing, but currently agreeable to it , discussed with patient HCPOA Ms [name] on 9/14, she discussed with Dr. Sharol Given, agreeable to surgery, patient went for bilateral above-knee amputation on 9/15. - With evidence of osteomyelitis on foot x-ray, he  treated with IV vancomycin and Zosyn, currently she had bilateral above-knee amputation, so no indication for further antibiotic, will discontinue today .  History of breast cancer - status post total mastectomy for right-sided invasive ductal carcinoma in April 2015. Also has remote history of left breast cancer. Continue Arimidex  Hypertension - Continue with Toprol-XL  ESRD - HD per renal.  Hypoglycemia -  monitor CBGs frequently   Severe protein calorie malnutrition - continue with supplements  Code Status : Patial  Family Communication  : D/W patient, none at bedside  Disposition Plan  : Pending further workup  Consults  :  Renal, vascular surgery, orthopedic  Procedures  :   Thrombectomy and revision of AV graft , insertion of right femoral dialysis catheter  Amputation above-knee bilateral, with application of prevena wound VAC x2 on 9/15 DVT Prophylaxis  :SCDs   Lab Results  Component Value Date   PLT 103 (L) 11/21/2015    Antibiotics  :   Anti-infectives    Start     Dose/Rate Route Frequency Ordered Stop   11/21/15 1500  ceFAZolin (ANCEF) IVPB 2g/100 mL premix     2 g 200 mL/hr over 30 Minutes Intravenous To [facility]Stay [facility] 11/20/15 1202 11/21/15 1646   11/21/15 0749  vancomycin (VANCOCIN) 500-5 MG/100ML-% IVPB  Status:  Discontinued    Comments:  Serrano, [place]   : cabinet override      11/21/15 0749 11/21/15 1012   11/19/15 1200  vancomycin (VANCOCIN) 500 mg in sodium chloride 0.9 % 100 mL IVPB  Status:   Discontinued     500 mg 100 mL/hr over 60 Minutes Intravenous Every M-W-F (Hemodialysis) 11/17/15 1149 11/22/15 1348   11/17/15 1215  piperacillin-tazobactam (ZOSYN) IVPB 3.375 g  Status:  Discontinued     3.375 g 12.5 mL/hr over 240 Minutes Intravenous Every 12 hours 11/17/15 1149 11/22/15 1348   11/17/15 1200  vancomycin (VANCOCIN) IVPB 1000 mg/200 mL premix     1,000 mg 200 mL/hr over 60 Minutes Intravenous Every M-W-F (Hemodialysis) 11/17/15 1149 11/17/15 1725        Objective:   Vitals:   11/21/15 1900 11/21/15 2039 11/22/15 0418 11/22/15 0900  BP: (!) 137/40 (!) 110/34 (!) 91/34 (!) 97/39  Pulse:  (!) 57 (!) 55 (!) 59  Resp:  20 19 16   Temp:  98 F (36.7 C) 98.5 F (36.9 C) 98 F (36.7 C)  TempSrc:  Oral Oral Oral  SpO2: 98% 100% 100% 100%  Weight:  47.2 kg (104 lb 0.9 oz)    Height:        Wt Readings from Last 3 Encounters:  11/21/15 47.2 kg (104 lb 0.9 oz)  11/04/15 46.7 kg (103 lb)  10/27/15 46.7 kg (103 lb)     Intake/Output Summary (Last 24 hours) at 11/22/15 1348 Last data filed at 11/22/15 1100  Gross per 24 hour  Intake           739.67 ml  Output              200 ml  Net           539.67 ml     Physical Exam  Awake Alert, Comfortable Supple Neck,No JVD, Symmetrical Chest wall movement, Good air movement bilaterally, CTAB RRR,No Gallops,Rubs or new Murmurs, No Parasternal Heave +ve B.Sounds, Abd Soft, No tenderness,No rebound - guarding or rigidity. Bilateral BKA, with bilateral wound VAC    Data Review:    CBC  Recent Labs Lab  11/16/15 0343 11/16/15 1157 11/17/15 1300 11/19/15 0703 11/21/15 0542  WBC 13.1* 14.5* 15.3* 14.4* 15.6*  HGB 6.5* 8.5* 8.1* 7.6* 13.1  HCT 20.1* 25.9* 24.9* 23.4* 39.3  PLT 163 142* 149* 123* 103*  MCV 92.6 91.8 91.2 92.9 91.2  MCH 30.0 30.1 29.7 30.2 30.4  MCHC 32.3 32.8 32.5 32.5 33.3  RDW 15.0 14.6 14.6 14.6 15.9*    Chemistries   Recent Labs Lab 11/16/15 0343 11/17/15 1300  11/19/15 0702  11/19/15 1649 11/20/15 0833 11/20/15 1644 11/21/15 0542 11/21/15 1755  NA 134* 133*  --  132*  --   --   --  133*  --   K 3.9 4.7  --  3.6  --   --   --  3.6  --   CL 100* 95*  --  95*  --   --   --  95*  --   CO2 28 29  --  30  --   --   --  27  --   GLUCOSE 143* 81  < > 67 67 31* 104* 70 102*  BUN 14 20  --  14  --   --   --  16  --   CREATININE 3.06* 4.10*  --  2.87*  --   --   --  2.63*  --   CALCIUM 9.5 9.9  --  9.6  --   --   --  9.8  --   < > = values in this interval not displayed. ------------------------------------------------------------------------------------------------------------------ No results for input(s): CHOL, HDL, LDLCALC, TRIG, CHOLHDL, LDLDIRECT in the last 72 hours.  Lab Results  Component Value Date   HGBA1C 5.2 11/17/2015   ------------------------------------------------------------------------------------------------------------------ No results for input(s): TSH, T4TOTAL, T3FREE, THYROIDAB in the last 72 hours.  Invalid input(s): FREET3 ------------------------------------------------------------------------------------------------------------------ No results for input(s): VITAMINB12, FOLATE, FERRITIN, TIBC, IRON, RETICCTPCT in the last 72 hours.  Coagulation profile No results for input(s): INR, PROTIME in the last 168 hours.  No results for input(s): DDIMER in the last 72 hours.  Cardiac Enzymes No results for input(s): CKMB, TROPONINI, MYOGLOBIN in the last 168 hours.  Invalid input(s): CK ------------------------------------------------------------------------------------------------------------------ No results found for: BNP  Inpatient Medications  Scheduled Meds: . sodium chloride   Intravenous Once  . sodium chloride   Intravenous Once  . sodium chloride   Intravenous Once  . sodium chloride   Intravenous Once  . anastrozole  1 mg Oral QPC supper  . cinacalcet  60 mg Oral Q supper  . darbepoetin (ARANESP) injection - DIALYSIS   100 mcg Intravenous Q Mon-HD  . feeding supplement (NEPRO CARB STEADY)  237 mL Oral BID BM  . feeding supplement (PRO-STAT SUGAR FREE 64)  30 mL Oral BID  . metoprolol succinate  50 mg Oral QPC supper  . multivitamin  1 tablet Oral QHS  . sodium chloride flush  3 mL Intravenous Q12H   Continuous Infusions: . sodium chloride 10 mL/hr at 11/21/15 1545  . sodium chloride 10 mL/hr at 11/22/15 0521   PRN Meds:.acetaminophen **OR** acetaminophen, HYDROmorphone (DILAUDID) injection, methocarbamol **OR** methocarbamol (ROBAXIN)  IV, metoCLOPramide **OR** metoCLOPramide (REGLAN) injection, ondansetron **OR** ondansetron (ZOFRAN) IV, oxyCODONE-acetaminophen  Micro Results Recent Results (from the past 240 hour(s))  MRSA PCR Screening     Status: None   Collection Time: 11/16/15  9:46 AM  Result Value Ref Range Status   MRSA by PCR NEGATIVE NEGATIVE Final    Comment:  The GeneXpert MRSA Assay (FDA approved for NASAL specimens only), is one component of a comprehensive MRSA colonization surveillance program. It is not intended to diagnose MRSA infection nor to guide or monitor treatment for MRSA infections.   Surgical PCR screen     Status: None   Collection Time: 11/20/15  5:04 PM  Result Value Ref Range Status   MRSA, PCR NEGATIVE NEGATIVE Final   Staphylococcus aureus NEGATIVE NEGATIVE Final    Comment:        The Xpert SA Assay (FDA approved for NASAL specimens in patients over 15 years of age), is one component of a comprehensive surveillance program.  Test performance has been validated by [facility] for patients greater than or equal to 80 year old. It is not intended to diagnose infection nor to guide or monitor treatment.     Radiology Reports Dg Chest Port 1 View  Result Date: 11/15/2015 CLINICAL DATA:  Status post dialysis catheter insertion through the right groin. EXAM: PORTABLE CHEST 1 VIEW COMPARISON:  12/27/2013 FINDINGS: Large bore catheter is seen from  inferior approach with tip overlying the expected location of the proximal right atrium. Vascular stent in the region of the left brachiocephalic vein is stable. Cardiomediastinal silhouette is normal. Mediastinal contours appear intact. There is no evidence of focal airspace consolidation, pleural effusion or pneumothorax. Osseous structures are without acute abnormality. Soft tissues are grossly normal. IMPRESSION: No active disease. Large bore catheter from the inferior approach terminates in the expected location of proximal right atrium. Electronically Signed   By: Fidela Salisbury M.D.   On: 11/15/2015 16:52   Dg Abd Portable 1v  Result Date: 11/15/2015 CLINICAL DATA:  Change in bowel habits EXAM: PORTABLE ABDOMEN - 1 VIEW COMPARISON:  03/24/2012 FINDINGS: There is moderate gaseous distension of the lower abdominal and pelvic bowel loops. Average volume of stool noted within the colon. No dilated loops of small bowel identified. Right groin dialysis catheter is identified. The tip is within the cavoatrial junction. IMPRESSION: 1. Nonobstructive bowel gas pattern. 2. The dialysis catheter tip is in the cavoatrial junction Electronically Signed   By: Kerby Moors M.D.   On: 11/15/2015 16:40   Dg Foot Complete Left  Result Date: 11/16/2015 CLINICAL DATA:  Osteomyelitis.  Left foot pain. EXAM: LEFT FOOT - COMPLETE 3+ VIEW COMPARISON:  None. FINDINGS: There is irregularity of the distal left first toe soft tissues. There is absence of the distal portion of the distal phalanx in the left first toe with associated erosive change at the distal osseous margin. There are multiple cortical erosions in the middle and proximal phalanges of the left fourth toe, with potential postsurgical changes in the proximal phalanx of the left fourth toe. Diffuse osteopenia. No fracture or dislocation. No suspicious focal osseous lesion. Diffuse vascular calcifications. No appreciable soft tissue gas. No periosteal  reaction. IMPRESSION: 1. Absence of the distal portion of the distal phalanx in the left first toe with associated erosive change at the distal osseous margin and overlying soft tissue irregularity, suggesting acute osteomyelitis, potentially at the site of prior amputation, correlate with surgical history. 2. Multiple cortical erosions in the left fourth toe suggestive of acute osteomyelitis with potential postsurgical changes in the proximal phalanx of the left fourth toe, correlate with surgical history. 3. Diffuse osteopenia. Electronically Signed   By: Ilona Sorrel M.D.   On: 11/16/2015 18:00   Dg Foot Complete Right  Result Date: 11/16/2015 CLINICAL DATA:  Osteomyelitis EXAM: RIGHT FOOT COMPLETE -  3+ VIEW COMPARISON:  None. FINDINGS: Three views of the right foot submitted. There is diffuse osteopenia. The patient is status post amputation of first and second toes. There is diffuse osteopenia. No definite evidence of bone destruction to suggest osteomyelitis. Extensive atherosclerotic vascular calcifications are noted. No acute fracture or subluxation IMPRESSION: No acute fracture or subluxation. Diffuse osteopenia. Status post amputation of first and second toes. No definite evidence of bone destruction to suggest osteomyelitis. Electronically Signed   By: Lahoma Crocker M.D.   On: 11/16/2015 17:51   Dg Fluoro Guide Cv Line-no Report  Result Date: 11/15/2015 CLINICAL DATA:  FLOURO GUIDE CV LINE Fluoroscopy was utilized by the requesting physician.  No radiographic interpretation.     [facility], Eula Mazzola M.D on 11/22/2015 at 1:48 PM  Between 7am to 7pm - Pager - [phone]  After 7pm go to [email or web address] - password [email or web address]  [facility] -  Office  [phone]

## 2015-11-22 NOTE — Progress Notes (Signed)
[facility] Progress Note   Subjective:  "My rump roast is hurting! I'm lying on it! But you know, I feel so much better after they cut those legs off." IV therapy at bedside, pt positioned on back for IV start. Very positive attitude about amputations, NAD.   Objective Vitals:   11/21/15 1815 11/21/15 1900 11/21/15 2039 11/22/15 0418  BP: (!) 134/40 (!) 137/40 (!) 110/34 (!) 91/34  Pulse: (!) 58  (!) 57 (!) 55  Resp:   20 19  Temp:   98 F (36.7 C) 98.5 F (36.9 C)  TempSrc:   Oral Oral  SpO2: 98% 98% 100% 100%  Weight:   47.2 kg (104 lb 0.9 oz)   Height:       Physical Exam General: [name] Heart: S1,S2, RRR  Lungs: BBS slight dec in bases, CTA.No WOB.  Abdomen: soft, non-tender Extremities: Bilateral BKA with bilateral wound vacs-serous drainage from vacs.  Dialysis Access: R fem TDC drsg intact. Drsg intact LUA.   Dialysis Orders: [place] MWF 3.75 hours EDW 47 - 3K 2.5 Ca heparin 1700 calcitriol 0.5 profile 4 - NEW right fem cath and left upper AVGG 9/9 Early Last Mircera 75 on 7/19 Recent labs: hgb 12 8/23 w 42% sat- hgb down to 10 on 9/6 - iPTH 324 Additional Objective Labs: Basic Metabolic Panel:  Recent Labs Lab 11/17/15 1300  11/19/15 0702  11/20/15 1644 11/21/15 0542 11/21/15 1755  NA 133*  --  132*  --   --  133*  --   K 4.7  --  3.6  --   --  3.6  --   CL 95*  --  95*  --   --  95*  --   CO2 29  --  30  --   --  27  --   GLUCOSE 81  < > 67  < > 104* 70 102*  BUN 20  --  14  --   --  16  --   CREATININE 4.10*  --  2.87*  --   --  2.63*  --   CALCIUM 9.9  --  9.6  --   --  9.8  --   PHOS 3.2  --  2.4*  --   --   --   --   < > = values in this interval not displayed. Liver Function Tests:  Recent Labs Lab 11/15/15 1017 11/17/15 1300 11/19/15 0702  AST 25  --   --   ALT 14  --   --   ALKPHOS 76  --   --   BILITOT 0.6  --   --   PROT 5.9*  --   --   ALBUMIN 2.3* 2.2* 1.9*   No results for input(s): LIPASE,  AMYLASE in the last 168 hours. CBC:  Recent Labs Lab 11/15/15 1017  11/16/15 0343 11/16/15 1157 11/17/15 1300 11/19/15 0703 11/21/15 0542  WBC 10.5  < > 13.1* 14.5* 15.3* 14.4* 15.6*  NEUTROABS 8.5*  --   --   --   --   --   --   HGB 8.9*  < > 6.5* 8.5* 8.1* 7.6* 13.1  HCT 28.3*  < > 20.1* 25.9* 24.9* 23.4* 39.3  MCV 93.1  < > 92.6 91.8 91.2 92.9 91.2  PLT 202  < > 163 142* 149* 123* 103*  < > = values in this interval not displayed. Blood Culture No results found for: SDES,  SPECREQUEST, CULT, REPTSTATUS  Cardiac Enzymes: No results for input(s): CKTOTAL, CKMB, CKMBINDEX, TROPONINI in the last 168 hours. CBG:  Recent Labs Lab 11/21/15 1824 11/21/15 1911 11/21/15 2034 11/22/15 0128 11/22/15 0736  GLUCAP 80 64* 72 94 74   Iron Studies: No results for input(s): IRON, TIBC, TRANSFERRIN, FERRITIN in the last 72 hours. @lablastinr3 @ Studies/Results: No results found. Medications: . sodium chloride 10 mL/hr at 11/21/15 1545  . sodium chloride 10 mL/hr at 11/22/15 0521   . sodium chloride   Intravenous Once  . sodium chloride   Intravenous Once  . sodium chloride   Intravenous Once  . sodium chloride   Intravenous Once  . anastrozole  1 mg Oral QPC supper  . cinacalcet  60 mg Oral Q supper  . darbepoetin (ARANESP) injection - DIALYSIS  100 mcg Intravenous Q Mon-HD  . feeding supplement (NEPRO CARB STEADY)  237 mL Oral BID BM  . feeding supplement (PRO-STAT SUGAR FREE 64)  30 mL Oral BID  . metoprolol succinate  50 mg Oral QPC supper  . multivitamin  1 tablet Oral QHS  . piperacillin-tazobactam (ZOSYN)  IV  3.375 g Intravenous Q12H  . sodium chloride flush  3 mL Intravenous Q12H  . vancomycin  500 mg Intravenous Q M,W,F-HD     Assessment/Plan: 1. s/p placement of new right tunneld fem cath and new left upper AVGG - due to recurrent bleeding with acute blood loss from old left upper AVGG 9/9- new R fem TDC per VVS. No issues with HD yesterday.  2. ESRD- MWF - Next  Tx Monday. K+ 3.6 3. Hypertension/volume- BP controlled. HD 11/21/15 Pre wt 49.4 Net UF 1500 Post wt 48.4kg. Lower EDW on DC.  4. Anemia- HGB 13.1 transfused 2 units PRBC 11/19/15 OnESA- rec'd Aranesp 100 mcg IV [id number].  5. Metabolic bone disease- Ca 9.8 C Ca 11.48 Phos 2.4. Hold Calcitriol. Change to 2.0 Ca bath. cont sensipar/ DC fosrenol.  6. Nutrition- Albumin 1.9 poor PO intake. Liberalize diet to regular diet with fld restrictions, - added vitamins and prostat. 7. PAD with acute left 1st and 4th toe osteoper xrays- Had bilateral BKA Friday. ADJUST EDW ON DC!  8. Hx breast cancer - s/p right mastectomy on arimidex 9. Disp - ultimately to return home - has no children - has two caregivers that share her care but no one actually spends the night - they tuck her into bed with her phone and come back in the am early enough on dialysis days to get her ready for dialysis. She strongly desires return to this  [employer] Brown NP-C 11/22/2015, 9:19 AM  [employer] [phone]

## 2015-11-22 NOTE — Progress Notes (Signed)
Return call from Dr. Sharol Given who states that we should have replacement canisters on [facility]. Call to [facility] who states that they do not have any in stock. Re-Page to Dr. Sharol Given for notification that we are unable to obtain canister at this time. Return call from [facility] to clarify appropriate size and now to send appropriate canister. Seraya Jobst, Thea Gist, RN

## 2015-11-23 LAB — GLUCOSE, CAPILLARY
GLUCOSE-CAPILLARY: 110 mg/dL — AB (ref 65–99)
GLUCOSE-CAPILLARY: 137 mg/dL — AB (ref 65–99)
GLUCOSE-CAPILLARY: 139 mg/dL — AB (ref 65–99)
Glucose-Capillary: 156 mg/dL — ABNORMAL HIGH (ref 65–99)

## 2015-11-23 NOTE — Progress Notes (Signed)
PROGRESS NOTE                                                                                                                                                                                                             Patient Demographics:    [name], is a 82 y.o. female, DOB - 01-Nov-1932, [id number]  Admit date - 11/15/2015   Admitting Physician Kinnie Feil, MD  Outpatient Primary MD for the patient is [name], MD  LOS - 7   Chief Complaint  Patient presents with  . graft bleeding       Brief Narrative   82 y.o.femalewith a medical history significant for, but not limited to,hypertension, diabetes, PAD, and end-stage renal disease on hemodialysis. Admitted for bleeding from her dialysis graft in left arm. Doppler study negative for DVT, s/p placement of new right tunneld fem cath and new left upper AVGG , Shunt with chronic right heel ulcer and necrotic toes bilaterally, seen by Dr. Sharol Given, She underwent bilateral above-knee amputation on 9/15.   Subjective:    [name] today has, No headache, No chest pain, No abdominal pain , Complains of Lower back pain   Assessment  & Plan :    Principal Problem:   Bleeding from dialysis shunt [facility]) Active Problems:   ESRD (end stage renal disease) on dialysis (HCC)   Hypertension   Acute post-hemorrhagic anemia   Incontinence of feces   Pressure ulcer   Absolute anemia   Osteomyelitis (HCC)   Protein-calorie malnutrition, severe   Bleeding from left AV graft - Vascular surgery consult greatly appreciated, status post thrombectomy and revision of AV graft, and placement of right femoral dialysis catheter, can use her left arm graft in 3 weeks per vascular surgery  Anemia of acute blood loss - Glycohemoglobin 6.5 in admission, Required 3 units PRBC transfusions so far, most recent 2 units on 9/13 given significant anemia with hemoglobin of 7.6 and anticipation for  surgery.  Severe peripheral vascular disease with dry gangrene of both feet/With osteomyelitis - Orthopedic consult greatly appreciated, recommendation is for above-knee amputation, patient initially refusing, but currently agreeable to it , discussed with patient HCPOA Ms [name] on 9/14, she discussed with Dr. Sharol Given, agreeable to surgery, patient went for bilateral above-knee amputation on 9/15. - With evidence of osteomyelitis on foot x-ray, he  treated with IV vancomycin and Zosyn, currently she had bilateral above-knee amputation, so no indication for further antibiotic, ABX stopped 9/16.  History of breast cancer - status post total mastectomy for right-sided invasive ductal carcinoma in April 2015. Also has remote history of left breast cancer. Continue Arimidex  Hypertension - Continue with Toprol-XL  ESRD - HD per renal.  Hypoglycemia -  monitor CBGs frequently   Severe protein calorie malnutrition - continue with supplements  Code Status : Patial  Family Communication  : D/W patient, none at bedside, left Ms [name] voice message  Disposition Plan  : Pending further workup  Consults  :  Renal, vascular surgery, orthopedic  Procedures  :   Thrombectomy and revision of AV graft , insertion of right femoral dialysis catheter  Amputation above-knee bilateral, with application of prevena wound VAC x2 on 9/15 DVT Prophylaxis  :SCDs , [place] heparin  Lab Results  Component Value Date   PLT 103 (L) 11/21/2015    Antibiotics  :   Anti-infectives    Start     Dose/Rate Route Frequency Ordered Stop   11/21/15 1500  ceFAZolin (ANCEF) IVPB 2g/100 mL premix     2 g 200 mL/hr over 30 Minutes Intravenous To [facility] 11/20/15 1202 11/21/15 1646   11/21/15 0749  vancomycin (VANCOCIN) 500-5 MG/100ML-% IVPB  Status:  Discontinued    Comments:  Serrano, [place]   : cabinet override      11/21/15 0749 11/21/15 1012   11/19/15 1200  vancomycin (VANCOCIN) 500 mg in sodium  chloride 0.9 % 100 mL IVPB  Status:  Discontinued     500 mg 100 mL/hr over 60 Minutes Intravenous Every M-W-F (Hemodialysis) 11/17/15 1149 11/22/15 1348   11/17/15 1215  piperacillin-tazobactam (ZOSYN) IVPB 3.375 g  Status:  Discontinued     3.375 g 12.5 mL/hr over 240 Minutes Intravenous Every 12 hours 11/17/15 1149 11/22/15 1348   11/17/15 1200  vancomycin (VANCOCIN) IVPB 1000 mg/200 mL premix     1,000 mg 200 mL/hr over 60 Minutes Intravenous Every M-W-F (Hemodialysis) 11/17/15 1149 11/17/15 1725        Objective:   Vitals:   11/22/15 1720 11/22/15 2051 11/23/15 0431 11/23/15 0900  BP: (!) 110/38 (!) 105/26 (!) 129/28 (!) 128/42  Pulse: 66 64 (!) 51 67  Resp: 16 16 17 16   Temp: 98.4 F (36.9 C) 98.5 F (36.9 C) 98 F (36.7 C) 98.3 F (36.8 C)  TempSrc: Oral Oral Oral Oral  SpO2:  100% 94% 100%  Weight:  47.3 kg (104 lb 4.4 oz)    Height:        Wt Readings from Last 3 Encounters:  11/22/15 47.3 kg (104 lb 4.4 oz)  11/04/15 46.7 kg (103 lb)  10/27/15 46.7 kg (103 lb)     Intake/Output Summary (Last 24 hours) at 11/23/15 1201 Last data filed at 11/23/15 1015  Gross per 24 hour  Intake              360 ml  Output                0 ml  Net              360 ml     Physical Exam  Awake Alert, Comfortable Supple Neck,No JVD, Symmetrical Chest wall movement, Good air movement bilaterally, CTAB RRR,No Gallops,Rubs or new Murmurs, No Parasternal Heave +ve B.Sounds, Abd Soft, No tenderness,No rebound - guarding or rigidity. Bilateral BKA, with bilateral  wound VAC    Data Review:    CBC  Recent Labs Lab 11/17/15 1300 11/19/15 0703 11/21/15 0542  WBC 15.3* 14.4* 15.6*  HGB 8.1* 7.6* 13.1  HCT 24.9* 23.4* 39.3  PLT 149* 123* 103*  MCV 91.2 92.9 91.2  MCH 29.7 30.2 30.4  MCHC 32.5 32.5 33.3  RDW 14.6 14.6 15.9*    Chemistries   Recent Labs Lab 11/17/15 1300  11/19/15 0702 11/19/15 1649 11/20/15 0833 11/20/15 1644 11/21/15 0542 11/21/15 1755    NA 133*  --  132*  --   --   --  133*  --   K 4.7  --  3.6  --   --   --  3.6  --   CL 95*  --  95*  --   --   --  95*  --   CO2 29  --  30  --   --   --  27  --   GLUCOSE 81  < > 67 67 31* 104* 70 102*  BUN 20  --  14  --   --   --  16  --   CREATININE 4.10*  --  2.87*  --   --   --  2.63*  --   CALCIUM 9.9  --  9.6  --   --   --  9.8  --   < > = values in this interval not displayed. ------------------------------------------------------------------------------------------------------------------ No results for input(s): CHOL, HDL, LDLCALC, TRIG, CHOLHDL, LDLDIRECT in the last 72 hours.  Lab Results  Component Value Date   HGBA1C 5.2 11/17/2015   ------------------------------------------------------------------------------------------------------------------ No results for input(s): TSH, T4TOTAL, T3FREE, THYROIDAB in the last 72 hours.  Invalid input(s): FREET3 ------------------------------------------------------------------------------------------------------------------ No results for input(s): VITAMINB12, FOLATE, FERRITIN, TIBC, IRON, RETICCTPCT in the last 72 hours.  Coagulation profile No results for input(s): INR, PROTIME in the last 168 hours.  No results for input(s): DDIMER in the last 72 hours.  Cardiac Enzymes No results for input(s): CKMB, TROPONINI, MYOGLOBIN in the last 168 hours.  Invalid input(s): CK ------------------------------------------------------------------------------------------------------------------ No results found for: BNP  Inpatient Medications  Scheduled Meds: . sodium chloride   Intravenous Once  . sodium chloride   Intravenous Once  . sodium chloride   Intravenous Once  . sodium chloride   Intravenous Once  . anastrozole  1 mg Oral QPC supper  . cinacalcet  60 mg Oral Q supper  . darbepoetin (ARANESP) injection - DIALYSIS  100 mcg Intravenous Q Mon-HD  . feeding supplement (NEPRO CARB STEADY)  237 mL Oral BID BM  . feeding  supplement (PRO-STAT SUGAR FREE 64)  30 mL Oral BID  . heparin subcutaneous  5,000 Units Subcutaneous Q12H  . metoprolol succinate  50 mg Oral QPC supper  . multivitamin  1 tablet Oral QHS  . sodium chloride flush  3 mL Intravenous Q12H   Continuous Infusions: . sodium chloride 10 mL/hr at 11/21/15 1545  . sodium chloride 10 mL/hr at 11/22/15 0521   PRN Meds:.acetaminophen **OR** acetaminophen, HYDROmorphone (DILAUDID) injection, methocarbamol **OR** methocarbamol (ROBAXIN)  IV, metoCLOPramide **OR** metoCLOPramide (REGLAN) injection, ondansetron **OR** ondansetron (ZOFRAN) IV, oxyCODONE-acetaminophen  Micro Results Recent Results (from the past 240 hour(s))  MRSA PCR Screening     Status: None   Collection Time: 11/16/15  9:46 AM  Result Value Ref Range Status   MRSA by PCR NEGATIVE NEGATIVE Final    Comment:        The GeneXpert MRSA Assay (FDA  approved for NASAL specimens only), is one component of a comprehensive MRSA colonization surveillance program. It is not intended to diagnose MRSA infection nor to guide or monitor treatment for MRSA infections.   Surgical PCR screen     Status: None   Collection Time: 11/20/15  5:04 PM  Result Value Ref Range Status   MRSA, PCR NEGATIVE NEGATIVE Final   Staphylococcus aureus NEGATIVE NEGATIVE Final    Comment:        The Xpert SA Assay (FDA approved for NASAL specimens in patients over 80 years of age), is one component of a comprehensive surveillance program.  Test performance has been validated by [facility] for patients greater than or equal to 80 year old. It is not intended to diagnose infection nor to guide or monitor treatment.     Radiology Reports Dg Chest Port 1 View  Result Date: 11/15/2015 CLINICAL DATA:  Status post dialysis catheter insertion through the right groin. EXAM: PORTABLE CHEST 1 VIEW COMPARISON:  12/27/2013 FINDINGS: Large bore catheter is seen from inferior approach with tip overlying the  expected location of the proximal right atrium. Vascular stent in the region of the left brachiocephalic vein is stable. Cardiomediastinal silhouette is normal. Mediastinal contours appear intact. There is no evidence of focal airspace consolidation, pleural effusion or pneumothorax. Osseous structures are without acute abnormality. Soft tissues are grossly normal. IMPRESSION: No active disease. Large bore catheter from the inferior approach terminates in the expected location of proximal right atrium. Electronically Signed   By: Fidela Salisbury M.D.   On: 11/15/2015 16:52   Dg Abd Portable 1v  Result Date: 11/15/2015 CLINICAL DATA:  Change in bowel habits EXAM: PORTABLE ABDOMEN - 1 VIEW COMPARISON:  03/24/2012 FINDINGS: There is moderate gaseous distension of the lower abdominal and pelvic bowel loops. Average volume of stool noted within the colon. No dilated loops of small bowel identified. Right groin dialysis catheter is identified. The tip is within the cavoatrial junction. IMPRESSION: 1. Nonobstructive bowel gas pattern. 2. The dialysis catheter tip is in the cavoatrial junction Electronically Signed   By: Kerby Moors M.D.   On: 11/15/2015 16:40   Dg Foot Complete Left  Result Date: 11/16/2015 CLINICAL DATA:  Osteomyelitis.  Left foot pain. EXAM: LEFT FOOT - COMPLETE 3+ VIEW COMPARISON:  None. FINDINGS: There is irregularity of the distal left first toe soft tissues. There is absence of the distal portion of the distal phalanx in the left first toe with associated erosive change at the distal osseous margin. There are multiple cortical erosions in the middle and proximal phalanges of the left fourth toe, with potential postsurgical changes in the proximal phalanx of the left fourth toe. Diffuse osteopenia. No fracture or dislocation. No suspicious focal osseous lesion. Diffuse vascular calcifications. No appreciable soft tissue gas. No periosteal reaction. IMPRESSION: 1. Absence of the distal  portion of the distal phalanx in the left first toe with associated erosive change at the distal osseous margin and overlying soft tissue irregularity, suggesting acute osteomyelitis, potentially at the site of prior amputation, correlate with surgical history. 2. Multiple cortical erosions in the left fourth toe suggestive of acute osteomyelitis with potential postsurgical changes in the proximal phalanx of the left fourth toe, correlate with surgical history. 3. Diffuse osteopenia. Electronically Signed   By: Ilona Sorrel M.D.   On: 11/16/2015 18:00   Dg Foot Complete Right  Result Date: 11/16/2015 CLINICAL DATA:  Osteomyelitis EXAM: RIGHT FOOT COMPLETE - 3+ VIEW COMPARISON:  None. FINDINGS: Three views of the right foot submitted. There is diffuse osteopenia. The patient is status post amputation of first and second toes. There is diffuse osteopenia. No definite evidence of bone destruction to suggest osteomyelitis. Extensive atherosclerotic vascular calcifications are noted. No acute fracture or subluxation IMPRESSION: No acute fracture or subluxation. Diffuse osteopenia. Status post amputation of first and second toes. No definite evidence of bone destruction to suggest osteomyelitis. Electronically Signed   By: Lahoma Crocker M.D.   On: 11/16/2015 17:51   Dg Fluoro Guide Cv Line-no Report  Result Date: 11/15/2015 CLINICAL DATA:  FLOURO GUIDE CV LINE Fluoroscopy was utilized by the requesting physician.  No radiographic interpretation.     [facility], Ernestina Joe M.D on 11/23/2015 at 12:01 PM  Between 7am to 7pm - Pager - [phone]  After 7pm go to [email or web address] - password [facility]  [facility] -  Office  [phone]

## 2015-11-23 NOTE — Evaluation (Signed)
Physical Therapy Evaluation Patient Details Name: [name] MRN: [id number] DOB: [birth year] Today's Date: 11/23/2015   History of Present Illness  [name] is a 82 y.o. female who presents with diabetic insensate neuropathy end-stage renal disease on dialysis with severe peripheral vascular disease with chronic gangrene of both feet. Now s/p Bil AKAs, VAC placement;  has a past medical history of Anemia; Arthritis; Bilateral breast cancer ([place]); DM (diabetes mellitus) ([place]); Dyslipidemia; ESRD (end stage renal disease) on dialysis ([place]); Hepatitis C; Hyperparathyroidism; Hypertension; PVD (peripheral vascular disease) ([place]); and Vascular disease.  Clinical Impression   Patient is s/p above surgery resulting in functional limitations due to the deficits listed below (see PT Problem List).  Patient will benefit from skilled PT to increase their independence and safety with mobility to allow discharge to the venue listed below.       Follow Up Recommendations SNF    Equipment Recommendations  Wheelchair (measurements PT);Wheelchair cushion (measurements PT)    Recommendations for Other Services       Precautions / Restrictions Precautions Precautions: Fall Restrictions Weight Bearing Restrictions: Yes      Mobility  Bed Mobility Overal bed mobility: Needs Assistance Bed Mobility: Supine to Sit;Sit to Supine     Supine to sit: +2 for physical assistance;Max assist Sit to supine: +2 for physical assistance;Mod assist   General bed mobility comments: Cues for initiation and to use UEs on rails to push up; used bed pad to cradle and support residual limbs during transition; heavy posterior support provided  Transfers                    Ambulation/Gait                Stairs            Wheelchair Mobility    Modified Rankin (Stroke Patients Only)       Balance Overall balance assessment: Needs assistance Sitting-balance support: Single  extremity supported Sitting balance-Leahy Scale: Zero Sitting balance - Comments: Needing cosntant posterior support for sitting balance in relative unsupported sitting at EOB Postural control: Posterior lean                                   Pertinent Vitals/Pain Pain Assessment: Faces Pain Score: 8  Faces Pain Scale: Hurts whole lot Pain Location: Bil AKAs, yelled in pain with movement; tolerates better when both residual limbs are supported on bed pads Pain Descriptors / Indicators: Grimacing;Guarding;Crying Pain Intervention(s): Monitored during session;Repositioned    Home Living Family/patient expects to be discharged to:: Skilled nursing facility Living Arrangements: Alone                    Prior Function Level of Independence: Independent with assistive device(s)         Comments: Not very forthcoming with PLOF     Hand Dominance        Extremity/Trunk Assessment   Upper Extremity Assessment: LUE deficits/detail       LUE Deficits / Details: Decr ROM throughout, resembling hemiperesis; noted some active shoulder elevation, elbow flexion, and hand opening; edema, with stretched skin noted as well   Lower Extremity Assessment: RLE deficits/detail;LLE deficits/detail RLE Deficits / Details: Hesitant to move residual limbs at hips at all, due to pain LLE Deficits / Details: Hesitant to move residual limbs at hips at all, due to pain  Communication   Communication: No difficulties  Cognition Arousal/Alertness: Awake/alert Behavior During Therapy: Flat affect (not very communicative) Overall Cognitive Status: Within Functional Limits for tasks assessed (for simple mobility)                      General Comments General comments (skin integrity, edema, etc.): RN notified pt was soiled with stool     Exercises     Assessment/Plan    PT Assessment Patient needs continued PT services  PT Problem List Decreased  strength;Decreased range of motion;Decreased activity tolerance;Decreased balance;Decreased mobility;Decreased knowledge of use of DME;Decreased knowledge of precautions;Impaired sensation;Pain          PT Treatment Interventions Functional mobility training;Therapeutic activities;Therapeutic exercise;Balance training;Cognitive remediation;Patient/family education    PT Goals (Current goals can be found in the Care Plan section)  Acute Rehab PT Goals Patient Stated Goal: did nto state PT Goal Formulation: With patient Time For Goal Achievement: 12/07/15 Potential to Achieve Goals: Good    Frequency Min 2X/week   Barriers to discharge        Co-evaluation               End of Session Equipment Utilized During Treatment:  (bed pad) Activity Tolerance: Patient limited by pain Patient left: in bed;with call bell/phone within reach (bed in semi-chair position) Nurse Communication: Mobility status         Time: [id number] PT Time Calculation (min) (ACUTE ONLY): 13 min   Charges:   PT Evaluation $PT Eval Moderate Complexity: 1 Procedure     PT G CodesQuin Hoop 11/23/2015, 6:26 PM  Roney Marion, [place] Pager [phone] Office [phone]

## 2015-11-23 NOTE — Progress Notes (Signed)
[facility] Progress Note   Subjective: "I'm doing well. I'm going to have to go to a nursing home though...." Denies pain/discomfort.     Objective Vitals:   11/22/15 0900 11/22/15 1720 11/22/15 2051 11/23/15 0431  BP: (!) 97/39 (!) 110/38 (!) 105/26 (!) 129/28  Pulse: (!) 59 66 64 (!) 51  Resp: 16 16 16 17   Temp: 98 F (36.7 C) 98.4 F (36.9 C) 98.5 F (36.9 C) 98 F (36.7 C)  TempSrc: Oral Oral Oral Oral  SpO2: 100%  100% 94%  Weight:   47.3 kg (104 lb 4.4 oz)   Height:       Physical Exam General: [name] Heart: S1,S2, RRR  Lungs: BBS slight dec in bases, CTA.No WOB.  Abdomen: soft, non-tender Extremities: Bilateral BKA with bilateral wound vacs-serous drainage from vacs.  Dialysis Access: R fem TDC drsg intact. Drsg intact LUA.   Dialysis Orders: [facility] M[facility] 3.75 hours EDW 47 - 3K 2.5 Ca heparin 1700 calcitriol 0.5 profile 4 - NEW right fem cath and left upper AVGG 9/9 Early Last Mircera 75 on 7/19 Recent labs: hgb 12 8/23 w 42% sat- hgb down to 10 on 9/6 - iPTH 324  Additional Objective Labs: Basic Metabolic Panel:  Recent Labs Lab 11/17/15 1300  11/19/15 0702  11/20/15 1644 11/21/15 0542 11/21/15 1755  NA 133*  --  132*  --   --  133*  --   K 4.7  --  3.6  --   --  3.6  --   CL 95*  --  95*  --   --  95*  --   CO2 29  --  30  --   --  27  --   GLUCOSE 81  < > 67  < > 104* 70 102*  BUN 20  --  14  --   --  16  --   CREATININE 4.10*  --  2.87*  --   --  2.63*  --   CALCIUM 9.9  --  9.6  --   --  9.8  --   PHOS 3.2  --  2.4*  --   --   --   --   < > = values in this interval not displayed. Liver Function Tests:  Recent Labs Lab 11/17/15 1300 11/19/15 0702  ALBUMIN 2.2* 1.9*   CBC:  Recent Labs Lab 11/16/15 1157 11/17/15 1300 11/19/15 0703 11/21/15 0542  WBC 14.5* 15.3* 14.4* 15.6*  HGB 8.5* 8.1* 7.6* 13.1  HCT 25.9* 24.9* 23.4* 39.3  MCV 91.8 91.2 92.9 91.2  PLT 142* 149* 123* 103*    CBG:  Recent  Labs Lab 11/22/15 0736 11/22/15 1157 11/22/15 1712 11/22/15 2050 11/23/15 0754  GLUCAP 74 111* 92 118* 110*   Studies/Results: No results found. Medications: . sodium chloride 10 mL/hr at 11/21/15 1545  . sodium chloride 10 mL/hr at 11/22/15 0521   . sodium chloride   Intravenous Once  . sodium chloride   Intravenous Once  . sodium chloride   Intravenous Once  . sodium chloride   Intravenous Once  . anastrozole  1 mg Oral QPC supper  . cinacalcet  60 mg Oral Q supper  . darbepoetin (ARANESP) injection - DIALYSIS  100 mcg Intravenous Q Mon-HD  . feeding supplement (NEPRO CARB STEADY)  237 mL Oral BID BM  . feeding supplement (PRO-STAT SUGAR FREE 64)  30 mL Oral BID  . heparin subcutaneous  5,000 Units Subcutaneous Q12H  . metoprolol succinate  50 mg Oral QPC supper  . multivitamin  1 tablet Oral QHS  . sodium chloride flush  3 mL Intravenous Q12H     Assessment/Plan: 1. s/p placement of new right tunneld fem cath and new left upper AVGG - due to recurrent bleeding with acute blood loss from old left upper AVGG 9/9- new R fem TDC per VVS. . 2. ESRD- MWF - Next Tx Monday. K+ 3.6 3. Hypertension/volume- BP controlled. HD 11/21/15 Pre wt 49.4 Net UF 1500 Post wt 48.4kg. Lower EDW on DC.  4. Anemia- HGB 13.1 transfused 2 units PRBC 11/19/15 OnESA- rec'd Aranesp 100 mcg IV [id number]. 5. Metabolic bone disease- Ca 9.8 C Ca 11.48Phos 2.4. Hold Calcitriol. Change to 2.0 Ca bath. cont sensipar/ DC fosrenol.  6.Nutrition- Albumin 1.9 poor PO intake. Liberalize diet to regular diet with fld restrictions, - added vitamins and prostat. 7.PAD with acute left 1st and 4th toe osteoper xrays- Had bilateral BKA Friday. ADJUST EDW ON DC!  8.Hx breast cancer - s/p right mastectomy on arimidex 9.Disp - Says she will go to SNF-specifically [facility] for rehab. Hopefully DC tomorrow after HD.   [name] H. Heather Cerrito NP-C 11/23/2015, 10:26 AM  [employer] [phone]

## 2015-11-23 NOTE — Care Management Note (Signed)
Case Management Note  Patient Details  Name: [name] MRN: [id number] Date of Birth: [birth year]  Subjective/Objective:      S/p AMPUTATION ABOVE KNEE (Bilateral) as a surgical intervention on 9/16.              Action/Plan: CM met with patient at bedside  to discuss care transition planning. Patient has Medicare and  Discussed care goals patient states,  she plans to go to [facility] for rehab.  Consult for CSW placed  Expected Discharge Date:    11/23/2015              Expected Discharge Plan:  [place]  In-House Referral:  Clinical Social Work  Discharge planning Services  CM Consult  Post Acute Care Choice:    Choice offered to:  Patient  DME Arranged:    DME Agency:     HH Arranged:    [place] Agency:     Status of Service:  In process, will continue to follow  If discussed at Long Length of Stay Meetings, dates discussed:    Additional CommentsLaurena Slimmer, RN 11/23/2015, 2:47 PM

## 2015-11-23 NOTE — Progress Notes (Signed)
Patient ID: [name], female   DOB: [birth year], 82 y.o.   MRN: [id number] Patient resting comfortably at this time. The wound VAC dressing to remain in place for 1 week at which time dressing may be removed and dry dressing changes started.

## 2015-11-24 ENCOUNTER — Encounter (HOSPITAL_COMMUNITY): Payer: Self-pay | Admitting: Orthopedic Surgery

## 2015-11-24 DIAGNOSIS — M7989 Other specified soft tissue disorders: Secondary | ICD-10-CM | POA: Diagnosis not present

## 2015-11-24 DIAGNOSIS — E43 Unspecified severe protein-calorie malnutrition: Secondary | ICD-10-CM | POA: Diagnosis not present

## 2015-11-24 DIAGNOSIS — I129 Hypertensive chronic kidney disease with stage 1 through stage 4 chronic kidney disease, or unspecified chronic kidney disease: Secondary | ICD-10-CM | POA: Diagnosis not present

## 2015-11-24 DIAGNOSIS — E785 Hyperlipidemia, unspecified: Secondary | ICD-10-CM | POA: Diagnosis not present

## 2015-11-24 DIAGNOSIS — Z853 Personal history of malignant neoplasm of breast: Secondary | ICD-10-CM | POA: Diagnosis not present

## 2015-11-24 DIAGNOSIS — Z992 Dependence on renal dialysis: Secondary | ICD-10-CM | POA: Diagnosis not present

## 2015-11-24 DIAGNOSIS — Z89619 Acquired absence of unspecified leg above knee: Secondary | ICD-10-CM | POA: Diagnosis not present

## 2015-11-24 DIAGNOSIS — M6281 Muscle weakness (generalized): Secondary | ICD-10-CM | POA: Diagnosis not present

## 2015-11-24 DIAGNOSIS — S81002A Unspecified open wound, left knee, initial encounter: Secondary | ICD-10-CM | POA: Diagnosis not present

## 2015-11-24 DIAGNOSIS — R58 Hemorrhage, not elsewhere classified: Secondary | ICD-10-CM | POA: Diagnosis not present

## 2015-11-24 DIAGNOSIS — Y828 Other medical devices associated with adverse incidents: Secondary | ICD-10-CM | POA: Diagnosis not present

## 2015-11-24 DIAGNOSIS — D509 Iron deficiency anemia, unspecified: Secondary | ICD-10-CM | POA: Diagnosis not present

## 2015-11-24 DIAGNOSIS — E119 Type 2 diabetes mellitus without complications: Secondary | ICD-10-CM | POA: Diagnosis not present

## 2015-11-24 DIAGNOSIS — R159 Full incontinence of feces: Secondary | ICD-10-CM | POA: Diagnosis not present

## 2015-11-24 DIAGNOSIS — D649 Anemia, unspecified: Secondary | ICD-10-CM | POA: Diagnosis not present

## 2015-11-24 DIAGNOSIS — B1920 Unspecified viral hepatitis C without hepatic coma: Secondary | ICD-10-CM | POA: Diagnosis not present

## 2015-11-24 DIAGNOSIS — E139 Other specified diabetes mellitus without complications: Secondary | ICD-10-CM | POA: Diagnosis not present

## 2015-11-24 DIAGNOSIS — S81001A Unspecified open wound, right knee, initial encounter: Secondary | ICD-10-CM | POA: Diagnosis not present

## 2015-11-24 DIAGNOSIS — E1122 Type 2 diabetes mellitus with diabetic chronic kidney disease: Secondary | ICD-10-CM | POA: Diagnosis not present

## 2015-11-24 DIAGNOSIS — R278 Other lack of coordination: Secondary | ICD-10-CM | POA: Diagnosis not present

## 2015-11-24 DIAGNOSIS — Z79899 Other long term (current) drug therapy: Secondary | ICD-10-CM | POA: Diagnosis not present

## 2015-11-24 DIAGNOSIS — T814XXA Infection following a procedure, initial encounter: Secondary | ICD-10-CM | POA: Diagnosis not present

## 2015-11-24 DIAGNOSIS — E213 Hyperparathyroidism, unspecified: Secondary | ICD-10-CM | POA: Diagnosis not present

## 2015-11-24 DIAGNOSIS — T8189XA Other complications of procedures, not elsewhere classified, initial encounter: Secondary | ICD-10-CM | POA: Diagnosis not present

## 2015-11-24 DIAGNOSIS — T82838D Hemorrhage of vascular prosthetic devices, implants and grafts, subsequent encounter: Secondary | ICD-10-CM | POA: Diagnosis not present

## 2015-11-24 DIAGNOSIS — T82838A Hemorrhage of vascular prosthetic devices, implants and grafts, initial encounter: Secondary | ICD-10-CM | POA: Diagnosis not present

## 2015-11-24 DIAGNOSIS — D631 Anemia in chronic kidney disease: Secondary | ICD-10-CM | POA: Diagnosis not present

## 2015-11-24 DIAGNOSIS — I10 Essential (primary) hypertension: Secondary | ICD-10-CM | POA: Diagnosis not present

## 2015-11-24 DIAGNOSIS — S88019A Complete traumatic amputation at knee level, unspecified lower leg, initial encounter: Secondary | ICD-10-CM | POA: Diagnosis not present

## 2015-11-24 DIAGNOSIS — Z452 Encounter for adjustment and management of vascular access device: Secondary | ICD-10-CM | POA: Diagnosis not present

## 2015-11-24 DIAGNOSIS — I959 Hypotension, unspecified: Secondary | ICD-10-CM | POA: Diagnosis not present

## 2015-11-24 DIAGNOSIS — Z87891 Personal history of nicotine dependence: Secondary | ICD-10-CM | POA: Diagnosis not present

## 2015-11-24 DIAGNOSIS — Z89612 Acquired absence of left leg above knee: Secondary | ICD-10-CM | POA: Diagnosis not present

## 2015-11-24 DIAGNOSIS — M86672 Other chronic osteomyelitis, left ankle and foot: Secondary | ICD-10-CM | POA: Diagnosis not present

## 2015-11-24 DIAGNOSIS — N186 End stage renal disease: Secondary | ICD-10-CM | POA: Diagnosis not present

## 2015-11-24 DIAGNOSIS — D62 Acute posthemorrhagic anemia: Secondary | ICD-10-CM | POA: Diagnosis not present

## 2015-11-24 DIAGNOSIS — M869 Osteomyelitis, unspecified: Secondary | ICD-10-CM | POA: Diagnosis not present

## 2015-11-24 DIAGNOSIS — Z8739 Personal history of other diseases of the musculoskeletal system and connective tissue: Secondary | ICD-10-CM | POA: Diagnosis not present

## 2015-11-24 DIAGNOSIS — I120 Hypertensive chronic kidney disease with stage 5 chronic kidney disease or end stage renal disease: Secondary | ICD-10-CM | POA: Diagnosis not present

## 2015-11-24 DIAGNOSIS — R7309 Other abnormal glucose: Secondary | ICD-10-CM | POA: Diagnosis not present

## 2015-11-24 DIAGNOSIS — E1129 Type 2 diabetes mellitus with other diabetic kidney complication: Secondary | ICD-10-CM | POA: Diagnosis not present

## 2015-11-24 DIAGNOSIS — N2581 Secondary hyperparathyroidism of renal origin: Secondary | ICD-10-CM | POA: Diagnosis not present

## 2015-11-24 DIAGNOSIS — Z89611 Acquired absence of right leg above knee: Secondary | ICD-10-CM | POA: Diagnosis not present

## 2015-11-24 DIAGNOSIS — R630 Anorexia: Secondary | ICD-10-CM | POA: Diagnosis not present

## 2015-11-24 DIAGNOSIS — I739 Peripheral vascular disease, unspecified: Secondary | ICD-10-CM | POA: Diagnosis not present

## 2015-11-24 DIAGNOSIS — M868X8 Other osteomyelitis, other site: Secondary | ICD-10-CM | POA: Diagnosis not present

## 2015-11-24 DIAGNOSIS — R5381 Other malaise: Secondary | ICD-10-CM | POA: Diagnosis not present

## 2015-11-24 DIAGNOSIS — R41841 Cognitive communication deficit: Secondary | ICD-10-CM | POA: Diagnosis not present

## 2015-11-24 DIAGNOSIS — C50919 Malignant neoplasm of unspecified site of unspecified female breast: Secondary | ICD-10-CM | POA: Diagnosis not present

## 2015-11-24 LAB — CBC
HCT: 29.2 % — ABNORMAL LOW (ref 36.0–46.0)
Hemoglobin: 9.3 g/dL — ABNORMAL LOW (ref 12.0–15.0)
MCH: 30 pg (ref 26.0–34.0)
MCHC: 31.8 g/dL (ref 30.0–36.0)
MCV: 94.2 fL (ref 78.0–100.0)
PLATELETS: 169 10*3/uL (ref 150–400)
RBC: 3.1 MIL/uL — AB (ref 3.87–5.11)
RDW: 17 % — ABNORMAL HIGH (ref 11.5–15.5)
WBC: 11.6 10*3/uL — AB (ref 4.0–10.5)

## 2015-11-24 LAB — RENAL FUNCTION PANEL
Albumin: 1.8 g/dL — ABNORMAL LOW (ref 3.5–5.0)
Anion gap: 11 (ref 5–15)
BUN: 37 mg/dL — ABNORMAL HIGH (ref 6–20)
CALCIUM: 9.5 mg/dL (ref 8.9–10.3)
CO2: 23 mmol/L (ref 22–32)
CREATININE: 4.3 mg/dL — AB (ref 0.44–1.00)
Chloride: 100 mmol/L — ABNORMAL LOW (ref 101–111)
GFR calc non Af Amer: 9 mL/min — ABNORMAL LOW (ref >60)
GFR, EST AFRICAN AMERICAN: 10 mL/min — AB (ref >60)
Glucose, Bld: 91 mg/dL (ref 65–99)
Phosphorus: 4.9 mg/dL — ABNORMAL HIGH (ref 2.5–4.6)
Potassium: 3.6 mmol/L (ref 3.5–5.1)
SODIUM: 134 mmol/L — AB (ref 135–145)

## 2015-11-24 LAB — GLUCOSE, CAPILLARY
GLUCOSE-CAPILLARY: 47 mg/dL — AB (ref 65–99)
GLUCOSE-CAPILLARY: 80 mg/dL (ref 65–99)
Glucose-Capillary: 89 mg/dL (ref 65–99)

## 2015-11-24 MED ORDER — TRAMADOL HCL 50 MG PO TABS: 50.0000 mg | ORAL_TABLET | Freq: Two times a day (BID) | ORAL | 0 refills | Status: DC | PRN

## 2015-11-24 MED ORDER — ACETAMINOPHEN 325 MG PO TABS: 650.0000 mg | ORAL_TABLET | Freq: Four times a day (QID) | ORAL | Status: DC | PRN

## 2015-11-24 MED ORDER — DEXTROSE 50 % IV SOLN
INTRAVENOUS | Status: AC
  Administered 2015-11-24: 50 mL
  Filled 2015-11-24: qty 50

## 2015-11-24 MED ORDER — DARBEPOETIN ALFA 100 MCG/0.5ML IJ SOSY
PREFILLED_SYRINGE | INTRAMUSCULAR | Status: AC
  Filled 2015-11-24: qty 0.5

## 2015-11-24 MED ORDER — METHOCARBAMOL 500 MG PO TABS: 500.0000 mg | ORAL_TABLET | Freq: Four times a day (QID) | ORAL | 0 refills | Status: DC | PRN

## 2015-11-24 MED ORDER — PRO-STAT SUGAR FREE PO LIQD: 30.0000 mL | Freq: Two times a day (BID) | ORAL | 0 refills | Status: DC

## 2015-11-24 MED ORDER — NEPRO/CARBSTEADY PO LIQD: 237.0000 mL | Freq: Two times a day (BID) | ORAL | 0 refills | Status: AC

## 2015-11-24 MED ORDER — METOPROLOL SUCCINATE ER 25 MG PO TB24: 25.0000 mg | ORAL_TABLET | Freq: Every day | ORAL | Status: DC

## 2015-11-24 MED ORDER — OXYCODONE-ACETAMINOPHEN 5-325 MG PO TABS: 1.0000 | ORAL_TABLET | ORAL | 0 refills | Status: DC | PRN

## 2015-11-24 NOTE — Progress Notes (Signed)
Ambulance transfer to SNF [facility]) arranged with [facility].  Jillyn Ledger, MBA, BSN, RN

## 2015-11-24 NOTE — Clinical Social Work Placement (Signed)
   CLINICAL SOCIAL WORK PLACEMENT  NOTE 11/24/15 - DISCHARGED TO [facility] REH[facility]  Date:  11/24/2015  Patient Details  Name: [name] MRN: [id number] Date of Birth: [birth year]  Clinical Social Work is seeking post-discharge placement for this patient at the [place] level of care (*CSW will initial, date and re-position this form in  chart as items are completed):  Yes (Niece provided with list)   Patient/family provided with [place] Work Department's list of facilities offering this level of care within the geographic area requested by the patient (or if unable, by the patient's family).  Yes   Patient/family informed of their freedom to choose among providers that offer the needed level of care, that participate in Medicare, Medicaid or managed care program needed by the patient, have an available bed and are willing to accept the patient.  No   Patient/family informed of 's ownership interest in [facility] and [facility], as well as of the fact that they are under no obligation to receive care at these facilities.  PASRR submitted to EDS on       PASRR number received on       Existing PASRR number confirmed on 11/24/15     FL2 transmitted to all facilities in geographic area requested by pt/family on 11/24/15     FL2 transmitted to all facilities within larger geographic area on       Patient informed that his/her managed care company has contracts with or will negotiate with certain facilities, including the following:        Yes   Patient/family informed of bed offers received.  Patient chooses bed at [facility]     Physician recommends and patient chooses bed at      Patient to be transferred to [facility] on 11/24/15.  Patient to be transferred to facility by ambulance     Patient family notified on 11/24/15 of transfer.  Name of family member notified:  [name], niece (while at hospital)      PHYSICIAN       Additional Comment:    _______________________________________________ Sable Feil, LCSW 11/24/2015, 6:06 PM

## 2015-11-24 NOTE — Progress Notes (Signed)
All fax numbers provided by SNF rang busy.  Gave report to April @ [facility].  Pt prepared for d/c to SNF. IV d/c'd. Skin intact except as charted in most recent assessments. Vitals are stable. Pt to be transported by ambulance service.  Jillyn Ledger, MBA, BSN, RN

## 2015-11-24 NOTE — Progress Notes (Signed)
Patient transported by [facility] to [facility]. Dredyn Gubbels, Wonda Cheng, Therapist, sports

## 2015-11-24 NOTE — Progress Notes (Signed)
[facility] Progress Note   Subjective: no complaints, on HD  Objective Vitals:   11/23/15 1900 11/23/15 2013 11/24/15 0643 11/24/15 0900  BP: (!) 114/39 (!) 123/33 (!) 130/44 (!) 126/55  Pulse: 61 63 62 62  Resp: 16 17 16    Temp: 99 F (37.2 C) 98.4 F (36.9 C) 97.9 F (36.6 C) 97.8 F (36.6 C)  TempSrc: Oral Oral Oral Oral  SpO2: 100% 99% 100%   Weight:  47.5 kg (104 lb 11.5 oz)    Height:       Physical Exam General: [name] Heart: S1,S2, RRR  Lungs: BBS slight dec in bases, CTA.No WOB.  Abdomen: soft, non-tender Extremities: Bilateral AKA with bilateral wound vacs-serous drainage from vacs.  Dialysis Access: R fem TDC drsg intact. Drsg intact LUA.   Dialysis Orders: [facility] MWF 3h 66min   47kg   3K/2.5 bath  Hep 1700   Calcitriol 0.5 profile 4 - NEW right fem cath and left upper AVGG 9/9 Early Last Mircera 75 on 7/19 Recent labs: hgb 12 8/23 w 42% sat- hgb down to 10 on 9/6 - iPTH 324  Assessment: 1. Bilat AKA/ gangrene - w wound VAC x 2 in place 2. Bleeding ulcer L AVG, sp new LUA AVG and R fem TDC - on 11/15/15 per [facility]VS 3. ESRD- MWF HD 4. Volume - is at prior dry but lost both legs, needs lower EDW 5. Anemia- HGB 13.1 transfused 2 units PRBC 11/19/15 OnESA- rec'd Aranesp 100 mcg IV 11/17/15. 6. Metabolic bone disease- Ca 9.8 C Ca 11.48Phos 2.4. Hold Calcitriol. Change to 2.0 Ca bath. cont sensipar/ DC fosrenol.  7. Nutrition- Albumin 1.9 poor PO intake. Liberalize diet to regular diet with fld restrictions, - added vitamins and prostat. 8. Hx breast cancer - s/p right mastectomy on arimidex 9. Disp - Says she will go to SNF-specifically [facility] for rehab, dispo per primary   Plan - HD today, max UF as tolerated.    Kelly Splinter MD [facility] pager [phone]    cell [phone] 11/24/2015, 10:46 AM   Additional Objective Labs: Basic Metabolic Panel:  Recent Labs Lab 11/17/15 1300   11/19/15 0702  11/21/15 0542 11/21/15 1755 11/24/15 0433  NA 133*  --  132*  --  133*  --  134*  K 4.7  --  3.6  --  3.6  --  3.6  CL 95*  --  95*  --  95*  --  100*  CO2 29  --  30  --  27  --  23  GLUCOSE 81  < > 67  < > 70 102* 91  BUN 20  --  14  --  16  --  37*  CREATININE 4.10*  --  2.87*  --  2.63*  --  4.30*  CALCIUM 9.9  --  9.6  --  9.8  --  9.5  PHOS 3.2  --  2.4*  --   --   --  4.9*  < > = values in this interval not displayed. Liver Function Tests:  Recent Labs Lab 11/17/15 1300 11/19/15 0702 11/24/15 0433  ALBUMIN 2.2* 1.9* 1.8*   CBC:  Recent Labs Lab 11/17/15 1300 11/19/15 0703 11/21/15 0542 11/24/15 0433  WBC 15.3* 14.4* 15.6* 11.6*  HGB 8.1* 7.6* 13.1 9.3*  HCT 24.9* 23.4* 39.3 29.2*  MCV 91.2 92.9 91.2 94.2  PLT 149* 123* 103* 169    CBG:  Recent Labs  Lab 11/23/15 0754 11/23/15 1213 11/23/15 1617 11/23/15 2011 11/24/15 0738  GLUCAP 110* 156* 137* 139* 89   Studies/Results: No results found. Medications: . sodium chloride 10 mL/hr at 11/21/15 1545  . sodium chloride 10 mL/hr at 11/22/15 0521   . sodium chloride   Intravenous Once  . sodium chloride   Intravenous Once  . sodium chloride   Intravenous Once  . sodium chloride   Intravenous Once  . anastrozole  1 mg Oral QPC supper  . cinacalcet  60 mg Oral Q supper  . darbepoetin (ARANESP) injection - DIALYSIS  100 mcg Intravenous Q Mon-HD  . feeding supplement (NEPRO CARB STEADY)  237 mL Oral BID BM  . feeding supplement (PRO-STAT SUGAR FREE 64)  30 mL Oral BID  . heparin subcutaneous  5,000 Units Subcutaneous Q12H  . metoprolol succinate  50 mg Oral QPC supper  . multivitamin  1 tablet Oral QHS  . sodium chloride flush  3 mL Intravenous Q12H

## 2015-11-24 NOTE — Discharge Summary (Signed)
[name], is a 82 y.o. female  DOB 1932-06-06  MRN [id number].  Admission date:  11/15/2015  Admitting Physician  Heather Feil, MD  Discharge Date:  11/24/2015   Primary MD  Heather Meckel, MD  Recommendations for primary care physician for things to follow:  - Patient to follow with Dr. Sharol [name] in 2 weeks. - Continue with current wound VAC dressing in for 1 week, then can be changed to dry dressing daily - patient to continue HD on MWF schedule  Admission Diagnosis  Bleeding from dialysis shunt, initial encounter ([place]) [T82.838A] Anemia, unspecified anemia type [D64.9]   Discharge Diagnosis  Bleeding from dialysis shunt, initial encounter ([place]) [T82.838A] Anemia, unspecified anemia type [D64.9]   Principal Problem:   Bleeding from dialysis shunt [facility] Active Problems:   ESRD (end stage renal disease) on dialysis ([place])   Hypertension   Acute post-hemorrhagic anemia   Incontinence of feces   Pressure ulcer   Absolute anemia   Osteomyelitis ([place])   Protein-calorie malnutrition, severe      Past Medical History:  Diagnosis Date  . Anemia   . Arthritis   . Bilateral breast cancer ([place])   . DM (diabetes mellitus) ([place])   . Dyslipidemia   . ESRD (end stage renal disease) on dialysis ([place])    a. [place]: MWF (07/10/2014)  . Hepatitis C   . Hyperparathyroidism   . Hypertension   . PVD (peripheral vascular disease) ([place])    a. s/p peripheral angiogram on 07/10/14 with no PCI and cont med Rx  . Vascular disease     Past Surgical History:  Procedure Laterality Date  . ABDOMINAL HYSTERECTOMY    . APPENDECTOMY    . ARTERIOVENOUS GRAFT PLACEMENT Left 03/20/09   "had right arm previously but couldn't use it anymore"  . BACK SURGERY    . BREAST BIOPSY Bilateral   . BREAST BIOPSY Right 08/08/2013   Procedure: Evacuation Hematoma Right Chest;  Surgeon: Heather Hector, MD;  Location:  [place];  Service: General;  Laterality: Right;  . CATARACT EXTRACTION Right   . DG AV DIALYSIS GRAFT DECLOT OR Left 01/27/11, 02/15/11, 03/15/11, 10/13/11   lua  . EVACUATION BREAST HEMATOMA  2001; 2015   left; right  . INSERTION OF DIALYSIS CATHETER Right 11/15/2015   Procedure: INSERTION OF Right Femoral  DIALYSIS CATHETER.;  Surgeon: Heather Posner, MD;  Location: [place];  Service: Vascular;  Laterality: Right;  . MASTECTOMY Left 1970's?  Marland Kitchen MASTECTOMY COMPLETE / SIMPLE Right 08/07/2013  . MEDIAN NERVE REPAIR Left    "decompression"  . PERIPHERAL VASCULAR CATHETERIZATION N/A 07/10/2014   Procedure: Abdominal Aortogram;  Surgeon: Heather Hampshire, MD;  Location: [place] INVASIVE CV LAB CUPID;  Service: Cardiovascular;  Laterality: N/A;  . POSTERIOR FUSION CERVICAL SPINE     "have 6 screws"  . THROMBECTOMY AND REVISION OF ARTERIOVENTOUS (AV) GORETEX  GRAFT Left 04/18/2009  . THROMBECTOMY AND REVISION OF ARTERIOVENTOUS (AV) GORETEX  GRAFT Left 11/15/2015   Procedure: Thrombectomy  and Revision of Left ARm AV Gortex Graft.;  Surgeon: Heather Posner, MD;  Location: [place];  Service: Vascular;  Laterality: Left;  . TOE AMPUTATION Right    1,2nd toes  . TONSILLECTOMY    . TOTAL MASTECTOMY Right 08/07/2013   Procedure: RIGHT TOTAL MASTECTOMY;  Surgeon: Heather Hector, MD;  Location: [place];  Service: General;  Laterality: Right;  . UTERINE FIBROID SURGERY         History of present illness and  Hospital Course:     Kindly see H&P for history of present illness and admission details, please review complete Labs, Consult reports and Test reports for all details in brief  HPI  from the history and physical done on the day of admission 11/15/2015 HPI: [name] is a 82 y.o. female with a medical history significant for, but not  limited to, hypertension, diabetes, PAD, and end-stage renal disease on hemodialysis. Brought to ED by EMS this a.m. for evaluation of bleeding from her dialysis graft in left arm. Patient  was able to complete full dialysis yesterday Seen in the ED a week and half ago for similar bleeding. At which time she also complained of right lower extremity pain. Doppler study negative for DVT. She has a follow-up appointment with vascular surgery in the next few days.  Patient complains of shortness of breath and lightheadedness, hemoglobin has dropped from 13 to 9. Per patient, bleeding started when home nursing assistant did dressing change. Patient's POA apparently voiced some concerns to nurse that patient may have initiated the bleeding herself . Per EMS blood was squirting from graft on arrival.     ED Course:  Afebrile, normal heart rate, BP 149/40, O2 saturation 100% on room air Hemoglobin 8.9, it was 14 on 11/04/2015 EDP applied pressure dressing, bleeding has ceased  EDP consulted Vascular - Dr. Donnetta [name] to see.    Hospital Course  82 y.o.femalewith a medical history significant for, but not limited to,hypertension, diabetes, PAD, and end-stage renal disease on hemodialysis. Admitted for bleeding from her dialysis graft in left arm. Doppler study negative for DVT, s/p placement of new right tunneld fem cath and new left upper AVGG , Shunt with chronic right heel ulcer and necrotic toes bilaterally, seen by Dr. Sharol [name], She underwent bilateral above-knee amputation on 9/15.  Bleeding from left AV graft - Vascular surgery consult greatly appreciated, status post thrombectomy and revision of AV graft, and placement of right femoral dialysis catheter, can use her left arm graft in 3 weeks per vascular surgery  Anemia of acute blood loss - Glycohemoglobin 6.5 in admission, Required 3 units PRBC transfusions so far, most recent 2 units on 9/13 [name] significant anemia with hemoglobin of 7.6 and anticipation for surgery, glycohemoglobin is 9.3 at day of discharge  Severe peripheral vascular disease with dry gangrene of both feet/With osteomyelitis - Orthopedic consult greatly  appreciated, recommendation is for above-knee amputation, patient initially refusing, but currently agreeable to it , discussed with patient HCPOA Ms [name] on 9/14, she discussed with Dr. Sharol [name], agreeable to surgery, patient went for bilateral above-knee amputation on 9/15. - With evidence of osteomyelitis on foot x-ray, he treated with IV vancomycin and Zosyn, currently she had bilateral above-knee amputation, so no indication for further antibiotic, ABX stopped 9/16.  History of breast cancer - status post total mastectomy for right-sided invasive ductal carcinoma in April 2015. Also has remote history of left breast cancer. Continue Arimidex  Hypertension - Continue with Toprol-XL  ESRD - HD per renal.  Hypoglycemia -  monitor CBGs frequently   Severe protein calorie malnutrition - continue with supplements  Discharge Condition:  stable   Follow UP  Follow-up Information    [facility].   Specialty:  Vascular Surgery Why:  As needed Contact information: [address] [place], MD Follow up in 2 week(s).   Specialty:  Orthopedic Surgery Contact information: [place] [address] [phone]             Discharge Instructions  and  Discharge Medications     Discharge Instructions    Discharge instructions    Complete by:  As directed    Follow with Primary MD [name] A, MD after discharge from SNF  Get CBC, CMP,  checked  by Primary MD next visit.      Disposition Home **   Diet: Normal modified with 1200 mL fluid restriction , with feeding assistance and aspiration precautions.  For Heart failure patients - Check your Weight same time everyday, if you gain over 2 pounds, or you develop in leg swelling, experience more shortness of breath or chest pain, call your Primary MD immediately. Follow Cardiac Low Salt Diet and 1.5  lit/day fluid restriction.   On your next visit with your primary care physician please Get Medicines reviewed and adjusted.   Please request your Prim.MD to go over all Hospital Tests and Procedure/Radiological results at the follow up, please get all Hospital records sent to your Prim MD by signing hospital release before you go home.   If you experience worsening of your admission symptoms, develop shortness of breath, life threatening emergency, suicidal or homicidal thoughts you must seek medical attention immediately by calling 911 or calling your MD immediately  if symptoms less severe.  You Must read complete instructions/literature along with all the possible adverse reactions/side effects for all the Medicines you take and that have been prescribed to you. Take any new Medicines after you have completely understood and accpet all the possible adverse reactions/side effects.   Do not drive, operating heavy machinery, perform activities at heights, swimming or participation in water activities or provide baby sitting services if your were admitted for syncope or siezures until you have seen by Primary MD or a Neurologist and advised to do so again.  Do not drive when taking Pain medications.    Do not take more than prescribed Pain, Sleep and Anxiety Medications  Special Instructions: If you have smoked or chewed Tobacco  in the last 2 yrs please stop smoking, stop any regular Alcohol  and or any Recreational drug use.  Wear Seat belts while driving.   Please note  You were cared for by a hospitalist during your hospital stay. If you have any questions about your discharge medications or the care you received while you were in the hospital after you are discharged, you can call the unit and asked to speak with the hospitalist on call if the hospitalist that took care of you is not available. Once you are discharged, your primary care physician will handle any further medical  issues. Please note that NO REFILLS for any discharge medications will be authorized once you are discharged, as it is imperative that you return to your primary care physician (or establish a relationship with a primary care physician if you do not have one) for your aftercare needs  so that they can reassess your need for medications and monitor your lab values.   Discharge wound care:    Complete by:  As directed    - The wound VAC dressing to remain in place for 1 week, after that  dressing may be removed , and patient to start a dry dressing changes daily. - Please keep left arm elevated with 1 beneath at all time   Increase activity slowly    Complete by:  As directed    Neg Press Wound Therapy / Incisional    Complete by:  As directed    Continue the incisional wound vacs until the pump alarms. At this time remove the dressing and pump and apply dry dressing. Patient may begin washing the incisions with soap and water after the [facility] is removed.       Medication List    STOP taking these medications   ibuprofen 200 MG tablet Commonly known as:  ADVIL,MOTRIN     TAKE these medications   acetaminophen 325 MG tablet Commonly known as:  TYLENOL Take 2 tablets (650 mg total) by mouth every 6 (six) hours as needed for mild pain (or Fever >/= 101).   anastrozole 1 MG tablet Commonly known as:  ARIMIDEX Take 1 tablet (1 mg total) by mouth daily. What changed:  when to take this   cinacalcet 60 MG tablet Commonly known as:  SENSIPAR Take 60 mg by mouth daily after supper.   DIALYVITE TABLET Tabs Take 1 tablet by mouth daily after supper.   feeding supplement (NEPRO CARB STEADY) Liqd Take 237 mLs by mouth 2 (two) times daily between meals.   feeding supplement (PRO-STAT SUGAR FREE 64) Liqd Take 30 mLs by mouth 2 (two) times daily.   lanthanum 1000 MG chewable tablet Commonly known as:  FOSRENOL Chew 1,000 mg by mouth daily after supper.   methocarbamol 500 MG tablet Commonly  known as:  ROBAXIN Take 1 tablet (500 mg total) by mouth every 6 (six) hours as needed for muscle spasms.   metoprolol succinate 25 MG 24 hr tablet Commonly known as:  TOPROL XL Take 1 tablet (25 mg total) by mouth daily. What changed:  medication strength  how much to take  when to take this  additional instructions   oxyCODONE-acetaminophen 5-325 MG tablet Commonly known as:  PERCOCET/ROXICET Take 1 tablet by mouth every 4 (four) hours as needed for severe pain.   traMADol 50 MG tablet Commonly known as:  ULTRAM Take 1 tablet (50 mg total) by mouth every 12 (twelve) hours as needed for moderate pain.         Diet and Activity recommendation: See Discharge Instructions above   Consults obtained -  Renal, vascular surgery, orthopedic  Major procedures and Radiology Reports - PLEASE review detailed and final reports for all details, in brief -    Thrombectomy and revision of AV graft , insertion of right femoral dialysis catheter  Amputation above-knee bilateral, with application of prevena wound VAC x2 on 9/15   Dg Chest Port 1 View  Result Date: 11/15/2015 CLINICAL DATA:  Status post dialysis catheter insertion through the right groin. EXAM: PORTABLE CHEST 1 VIEW COMPARISON:  12/27/2013 FINDINGS: Large bore catheter is seen from inferior approach with tip overlying the expected location of the proximal right atrium. Vascular stent in the region of the left brachiocephalic vein is stable. Cardiomediastinal silhouette is normal. Mediastinal contours appear intact. There is no evidence of focal airspace consolidation, pleural effusion  or pneumothorax. Osseous structures are without acute abnormality. Soft tissues are grossly normal. IMPRESSION: No active disease. Large bore catheter from the inferior approach terminates in the expected location of proximal right atrium. Electronically Signed   By: Fidela Salisbury M.D.   On: 11/15/2015 16:52   Dg Abd Portable  1v  Result Date: 11/15/2015 CLINICAL DATA:  Change in bowel habits EXAM: PORTABLE ABDOMEN - 1 VIEW COMPARISON:  03/24/2012 FINDINGS: There is moderate gaseous distension of the lower abdominal and pelvic bowel loops. Average volume of stool noted within the colon. No dilated loops of small bowel identified. Right groin dialysis catheter is identified. The tip is within the cavoatrial junction. IMPRESSION: 1. Nonobstructive bowel gas pattern. 2. The dialysis catheter tip is in the cavoatrial junction Electronically Signed   By: Kerby Moors M.D.   On: 11/15/2015 16:40   Dg Foot Complete Left  Result Date: 11/16/2015 CLINICAL DATA:  Osteomyelitis.  Left foot pain. EXAM: LEFT FOOT - COMPLETE 3+ VIEW COMPARISON:  None. FINDINGS: There is irregularity of the distal left first toe soft tissues. There is absence of the distal portion of the distal phalanx in the left first toe with associated erosive change at the distal osseous margin. There are multiple cortical erosions in the middle and proximal phalanges of the left fourth toe, with potential postsurgical changes in the proximal phalanx of the left fourth toe. Diffuse osteopenia. No fracture or dislocation. No suspicious focal osseous lesion. Diffuse vascular calcifications. No appreciable soft tissue gas. No periosteal reaction. IMPRESSION: 1. Absence of the distal portion of the distal phalanx in the left first toe with associated erosive change at the distal osseous margin and overlying soft tissue irregularity, suggesting acute osteomyelitis, potentially at the site of prior amputation, correlate with surgical history. 2. Multiple cortical erosions in the left fourth toe suggestive of acute osteomyelitis with potential postsurgical changes in the proximal phalanx of the left fourth toe, correlate with surgical history. 3. Diffuse osteopenia. Electronically Signed   By: Ilona Sorrel M.D.   On: 11/16/2015 18:00   Dg Foot Complete Right  Result Date:  11/16/2015 CLINICAL DATA:  Osteomyelitis EXAM: RIGHT FOOT COMPLETE - 3+ VIEW COMPARISON:  None. FINDINGS: Three views of the right foot submitted. There is diffuse osteopenia. The patient is status post amputation of first and second toes. There is diffuse osteopenia. No definite evidence of bone destruction to suggest osteomyelitis. Extensive atherosclerotic vascular calcifications are noted. No acute fracture or subluxation IMPRESSION: No acute fracture or subluxation. Diffuse osteopenia. Status post amputation of first and second toes. No definite evidence of bone destruction to suggest osteomyelitis. Electronically Signed   By: Lahoma Crocker M.D.   On: 11/16/2015 17:51   Dg Fluoro Guide Cv Line-no Report  Result Date: 11/15/2015 CLINICAL DATA:  FLOURO GUIDE CV LINE Fluoroscopy was utilized by the requesting physician.  No radiographic interpretation.    Micro Results    Recent Results (from the past 240 hour(s))  MRSA PCR Screening     Status: None   Collection Time: 11/16/15  9:46 AM  Result Value Ref Range Status   MRSA by PCR NEGATIVE NEGATIVE Final    Comment:        The GeneXpert MRSA Assay (FDA approved for NASAL specimens only), is one component of a comprehensive MRSA colonization surveillance program. It is not intended to diagnose MRSA infection nor to guide or monitor treatment for MRSA infections.   Surgical PCR screen     Status: None  Collection Time: 11/20/15  5:04 PM  Result Value Ref Range Status   MRSA, PCR NEGATIVE NEGATIVE Final   Staphylococcus aureus NEGATIVE NEGATIVE Final    Comment:        The Xpert SA Assay (FDA approved for NASAL specimens in patients over 28 years of age), is one component of a comprehensive surveillance program.  Test performance has been validated by [facility] for patients greater than or equal to 69 year old. It is not intended to diagnose infection nor to guide or monitor treatment.        Today   Subjective:    [name] today has no headache,no chest or abdominal pain, reports she is feeling good today.  Objective:   Blood pressure (!) 120/37, pulse (!) 56, temperature 98.1 F (36.7 C), temperature source Oral, resp. rate 18, height 5\' 3"  (1.6 m), weight 45.7 kg (100 lb 12 oz), SpO2 98 %.   Intake/Output Summary (Last 24 hours) at 11/24/15 1424 Last data filed at 11/24/15 0900  Gross per 24 hour  Intake              600 ml  Output                0 ml  Net              600 ml    Exam  Awake Alert, Comfortable Supple Neck,No JVD, Symmetrical Chest wall movement, Good air movement bilaterally, CTAB RRR,No Gallops,Rubs or new Murmurs, No Parasternal Heave +ve B.Sounds, Abd Soft, No tenderness,No rebound - guarding or rigidity. Bilateral BKA, with bilateral wound VAC  Data Review   CBC w Diff:  Lab Results  Component Value Date   WBC 11.6 (H) 11/24/2015   HGB 9.3 (L) 11/24/2015   HCT 29.2 (L) 11/24/2015   PLT 169 11/24/2015   LYMPHOPCT 12 11/15/2015   MONOPCT 7 11/15/2015   EOSPCT 0 11/15/2015   BASOPCT 0 11/15/2015    CMP:  Lab Results  Component Value Date   NA 134 (L) 11/24/2015   NA 139 09/04/2013   K 3.6 11/24/2015   K 4.0 09/04/2013   CL 100 (L) 11/24/2015   CO2 23 11/24/2015   CO2 31 (H) 09/04/2013   BUN 37 (H) 11/24/2015   BUN 49.9 (H) 09/04/2013   CREATININE 4.30 (H) 11/24/2015   CREATININE 6.2 (HH) 09/04/2013   PROT 5.9 (L) 11/15/2015   PROT 6.3 (L) 09/04/2013   ALBUMIN 1.8 (L) 11/24/2015   ALBUMIN 2.7 (L) 09/04/2013   BILITOT 0.6 11/15/2015   BILITOT 0.54 09/04/2013   ALKPHOS 76 11/15/2015   ALKPHOS 152 (H) 09/04/2013   AST 25 11/15/2015   AST 43 (H) 09/04/2013   ALT 14 11/15/2015   ALT 21 09/04/2013  .   Total Time in preparing paper work, data evaluation and todays exam - 35 minutes  Christion Leonhard M.D on 11/24/2015 at 2:24 PM  [facility]   Office  [phone]

## 2015-11-24 NOTE — Care Management Important Message (Signed)
Important Message  Patient Details  Name: [name] MRN: [id number] Date of Birth: 15-Jul-1932   Medicare Important Message Given:  Yes    Vianney Kopecky Montine Circle 11/24/2015, 11:38 AM

## 2015-11-24 NOTE — Progress Notes (Signed)
Attempted to give report to [facility], [phone]).  Call to nurses station went unanswered.  Jillyn Ledger, MBA, BSN, RN

## 2015-11-24 NOTE — Progress Notes (Signed)
Call [facility]) to inform them that the fax number they gave me rang busy twice.  April asked that I try to fax the D/C summary to [phone] or [phone].  Neither number worked, both rang busy.  Jillyn Ledger, MBA, BSN, RN

## 2015-11-24 NOTE — Progress Notes (Addendum)
[facility], to give report for patient transfer.  April (Nurse) stated that they had not received a discharge summary.  Facility requested that I fax discharge summary to [phone].  Attempted to fax D/C summary to facility (fax did not go through twice, due to the line being busy).  Jillyn Ledger, MBA, BSN, RN

## 2015-11-24 NOTE — Care Management Important Message (Signed)
Important Message  Patient Details  Name: [name] MRN: [id number] Date of Birth: [birth year]   Medicare Important Message Given:  Yes    [name] Montine Circle 11/24/2015, 11:42 AM

## 2015-11-24 NOTE — NC FL2 (Signed)
[place] LEVEL OF CARE SCREENING TOOL     IDENTIFICATION  Patient Name: [name] Birthdate: 18-Jun-1932 Sex: female Admission Date (Current Location): 11/15/2015  [facility] and [place] Number:  Herbalist and Address:  [facility], [place] [address], [place], [place] [address]      Provider Number: [id number]  Attending Physician Name and Address:  Albertine Patricia, MD  Relative Name and Phone Number:       Current Level of Care: Hospital Recommended Level of Care: [place] Prior Approval Number:    Date Approved/Denied:   PASRR Number: [id number] A Eff. 04/25/06    Discharge Plan: SNF    Current Diagnoses: Patient Active Problem List   Diagnosis Date Noted  . Protein-calorie malnutrition, severe 11/20/2015  . Osteomyelitis ([place]) 11/17/2015  . Pressure ulcer 11/16/2015  . Absolute anemia 11/16/2015  . Acute post-hemorrhagic anemia 11/15/2015  . Bleeding from dialysis shunt ([place]) 11/15/2015  . Incontinence of feces 11/15/2015  . ESRD (end stage renal disease) on dialysis ([place])   . Hepatitis C   . Hypertension   . Dyslipidemia   . PVD (peripheral vascular disease) ([place]) 07/02/2014  . End stage renal disease ([place]) 08/08/2013  . Postoperative wound hematoma 08/08/2013  . Breast cancer of lower-outer quadrant of right female breast ([place]) 07/19/2013    Orientation RESPIRATION BLADDER Height & Weight     Self, Time  Normal Continent Weight: 104 lb 11.5 oz (47.5 kg) Height:  5\' 3"  (160 cm)  BEHAVIORAL SYMPTOMS/MOOD NEUROLOGICAL BOWEL NUTRITION STATUS      Incontinent Diet (Renal with fluid restrictions)  AMBULATORY STATUS COMMUNICATION OF NEEDS Skin   Total Care Verbally Wound Vac (Bilateral amputation sites, Stage 2- sacrum, moisture associated skin damange)                       Personal Care Assistance Level of Assistance  Bathing, Dressing Bathing Assistance: Maximum assistance   Dressing  Assistance: Maximum assistance     Functional Limitations Info             SPECIAL CARE FACTORS FREQUENCY  PT (By licensed PT), OT (By licensed OT)     PT Frequency: 5 OT Frequency: 5            Contractures Contractures Info: Not present    Additional Factors Info  (S) Code Status, Allergies Code Status Info: Partial Code Allergies Info: Aspirin           Current Medications (11/24/2015):  This is the current hospital active medication list Current Facility-Administered Medications  Medication Dose Route Frequency Provider Last Rate Last Dose  . 0.9 %  sodium chloride infusion   Intravenous Once Willia Craze, NP      . 0.9 %  sodium chloride infusion   Intravenous Once Rosetta Posner, MD      . 0.9 %  sodium chloride infusion   Intravenous Once Albertine Patricia, MD      . 0.9 %  sodium chloride infusion   Intravenous Once Oswald Hillock, MD      . 0.9 %  sodium chloride infusion   Intravenous Continuous Oleta Mouse, MD 10 mL/hr at 11/21/15 1545    . 0.9 %  sodium chloride infusion   Intravenous Continuous Newt Minion, MD 10 mL/hr at 11/22/15 [phone]    . acetaminophen (TYLENOL) tablet 650 mg  650 mg Oral Q6H PRN Illene Regulus  Sharol Given, MD       Or  . acetaminophen (TYLENOL) suppository 650 mg  650 mg Rectal Q6H PRN Newt Minion, MD      . anastrozole (ARIMIDEX) tablet 1 mg  1 mg Oral QPC supper Willia Craze, NP   1 mg at 11/23/15 1739  . cinacalcet (SENSIPAR) tablet 60 mg  60 mg Oral Q supper Willia Craze, NP   60 mg at 11/23/15 1738  . Darbepoetin Alfa (ARANESP) injection 100 mcg  100 mcg Intravenous Q Mon-HD Alric Seton, PA-C   100 mcg at 11/17/15 1406  . feeding supplement (NEPRO CARB STEADY) liquid 237 mL  237 mL Oral BID BM Alric Seton, PA-C   237 mL at 11/23/15 1400  . feeding supplement (PRO-STAT SUGAR FREE 64) liquid 30 mL  30 mL Oral BID Valentina Gu, NP   30 mL at 11/23/15 2327  . heparin injection 5,000 Units  5,000 Units Subcutaneous  Q12H Albertine Patricia, MD   5,000 Units at 11/23/15 2327  . HYDROmorphone (DILAUDID) injection 1 mg  1 mg Intravenous Q2H PRN Newt Minion, MD      . methocarbamol (ROBAXIN) tablet 500 mg  500 mg Oral Q6H PRN Newt Minion, MD       Or  . methocarbamol (ROBAXIN) 500 mg in dextrose 5 % 50 mL IVPB  500 mg Intravenous Q6H PRN Newt Minion, MD      . metoCLOPramide (REGLAN) tablet 5-10 mg  5-10 mg Oral Q8H PRN Newt Minion, MD       Or  . metoCLOPramide (REGLAN) injection 5-10 mg  5-10 mg Intravenous Q8H PRN Newt Minion, MD      . metoprolol succinate (TOPROL-XL) 24 hr tablet 50 mg  50 mg Oral QPC supper Willia Craze, NP   50 mg at 11/20/15 1750  . multivitamin (RENA-VIT) tablet 1 tablet  1 tablet Oral QHS Alric Seton, PA-C   1 tablet at 11/23/15 2327  . ondansetron (ZOFRAN) tablet 4 mg  4 mg Oral Q6H PRN Newt Minion, MD       Or  . ondansetron [facility]) injection 4 mg  4 mg Intravenous Q6H PRN Newt Minion, MD      . oxyCODONE-acetaminophen (PERCOCET/ROXICET) 5-325 MG per tablet 1-2 tablet  1-2 tablet Oral Q8H PRN Oswald Hillock, MD   1 tablet at 11/23/15 2327  . sodium chloride flush (NS) 0.9 % injection 3 mL  3 mL Intravenous Q12H Willia Craze, NP   3 mL at 11/23/15 2328     Discharge Medications: Please see discharge summary for a list of discharge medications.  Relevant Imaging Results:  Relevant Lab Results:   Additional Information SSN:    Williemae Area, LCSW

## 2015-11-24 NOTE — Clinical Social Work Note (Signed)
Late Entry:  CSW received call yesterday from Dr. Waldron Labs that patient will require SNF level of care per PT recommendation. CSW will notify weekday SW of this as I was unable to assess patient yesterday for SNF work up. Active bed search will be initiated if patient agrees to SNF placement.  Lorie Phenix. Pauline Good, [place]

## 2015-11-24 NOTE — Clinical Social Work Note (Signed)
Clinical Social Work Assessment  Patient Details  Name: [name] MRN: [id number] Date of Birth: 1933-02-11  Date of referral:  11/21/15               Reason for consult:  Facility Placement                Permission sought to share information with:  Family Supports Permission granted to share information::  Yes, Verbal Permission Granted  Name::     [name]  Agency::     Relationship::  Daughter  Contact Information:  [phone]  Housing/Transportation Living arrangements for the past 2 months:  Single Family Home Source of Information:  Patient Patient Interpreter Needed:  None Criminal Activity/Legal Involvement Pertinent to Current Situation/Hospitalization:  No - Comment as needed Significant Relationships:  Other Family Members (Patient's niece [name] and nephew Hilaria Ota are the family members that primarily look after patient.) Lives with:  Self Do you feel safe going back to the place where you live?  Yes (Patient's feel safe at home, but understands and agrees that rehab will help her be stronger and safer upon her return home) Need for family participation in patient care:  Yes (Comment)  Care giving concerns: Patient's niece, [name] concerned about patient discharging home because she lives alone.  Social Worker assessment / plan:  CSW talked with patient's niece, [name] and patient regarding discharge planning and recommendation of ST rehab. Niece, [name] was proactively seeking to speak with CSW about SNF placement for ST rehab and provided  a facility preference. [name] reported that she and her brother [name] look after patient and she is her aunts POA.    CSW also spoke with patient regarding SNF for ST rehab. Ms. White was lying in bed and was alert, oriented and very pleasant and outspoken. She was aware that her niece was assisting her with SNF placement and was ok with going to the facility chosen by [name] [facility]). Mrs. Bokelman  informed CSW that her niece and nephew help with her care and have been doing so for a long time and especially after her husband died about 5 years ago.  Mrs. Tamala Julian mentioned wanting [facility] for rehab and CSW explained to patient why this facility is not available.     Employment status:  Retired Health visitor, Managed Care PT Recommendations:  [place] / Referral to community resources:  [place] (Niece, [name] provided with a list of skilled facilities for [place])  Patient/Family's Response to care:  No concerns expressed by patient or niece regarding care received during hospitalization.  Patient/Family's Understanding of and Emotional Response to Diagnosis, Current Treatment, and Prognosis:  Niece, [name] is concerned about how long patient will be able to remain at home alone and is thinking ahead to probable long-term placement for patient.  Emotional Assessment Appearance:  Appears stated age Attitude/Demeanor/Rapport:  Other (Appropriate) Affect (typically observed):  Accepting, Appropriate, Pleasant Orientation:  Oriented to Self, Oriented to Place, Oriented to  Time, Oriented to Situation Alcohol / Substance use:  Tobacco Use, Alcohol Use, Illicit Drugs (Patient reported that she stopped smoking and drinking in 1969.  Patient reports no illicit drug use) Psych involvement (Current and /or in the community):  No (Comment)  Discharge Needs  Concerns to be addressed:  Discharge Planning Concerns Readmission within the last 30 days:  No Current discharge risk:  None Barriers to Discharge:  No Barriers Identified   Sharlet Salina,  Mila Homer, LCSW 11/24/2015, 5:50 PM

## 2015-11-25 ENCOUNTER — Ambulatory Visit: Payer: Medicare Other | Admitting: Cardiovascular Disease

## 2015-11-26 DIAGNOSIS — N186 End stage renal disease: Secondary | ICD-10-CM | POA: Diagnosis not present

## 2015-11-26 DIAGNOSIS — R630 Anorexia: Secondary | ICD-10-CM | POA: Diagnosis not present

## 2015-11-26 DIAGNOSIS — M868X8 Other osteomyelitis, other site: Secondary | ICD-10-CM | POA: Diagnosis not present

## 2015-11-26 DIAGNOSIS — Z992 Dependence on renal dialysis: Secondary | ICD-10-CM | POA: Diagnosis not present

## 2015-11-26 DIAGNOSIS — D509 Iron deficiency anemia, unspecified: Secondary | ICD-10-CM | POA: Diagnosis not present

## 2015-11-26 DIAGNOSIS — N2581 Secondary hyperparathyroidism of renal origin: Secondary | ICD-10-CM | POA: Diagnosis not present

## 2015-11-26 DIAGNOSIS — D631 Anemia in chronic kidney disease: Secondary | ICD-10-CM | POA: Diagnosis not present

## 2015-11-26 DIAGNOSIS — E119 Type 2 diabetes mellitus without complications: Secondary | ICD-10-CM | POA: Diagnosis not present

## 2015-11-26 DIAGNOSIS — R7309 Other abnormal glucose: Secondary | ICD-10-CM | POA: Diagnosis not present

## 2015-11-27 LAB — GLUCOSE, CAPILLARY: GLUCOSE-CAPILLARY: 42 mg/dL — AB (ref 65–99)

## 2015-11-28 ENCOUNTER — Emergency Department (HOSPITAL_COMMUNITY)
Admission: EM | Admit: 2015-11-28 | Discharge: 2015-11-28 | Disposition: A | Discharge status: Home / Self Care | Payer: Medicare Other | Attending: Emergency Medicine | Admitting: Emergency Medicine

## 2015-11-28 ENCOUNTER — Encounter (HOSPITAL_COMMUNITY): Payer: Self-pay

## 2015-11-28 DIAGNOSIS — Z853 Personal history of malignant neoplasm of breast: Secondary | ICD-10-CM | POA: Diagnosis not present

## 2015-11-28 DIAGNOSIS — N186 End stage renal disease: Secondary | ICD-10-CM | POA: Insufficient documentation

## 2015-11-28 DIAGNOSIS — D509 Iron deficiency anemia, unspecified: Secondary | ICD-10-CM | POA: Diagnosis not present

## 2015-11-28 DIAGNOSIS — R5381 Other malaise: Secondary | ICD-10-CM | POA: Diagnosis not present

## 2015-11-28 DIAGNOSIS — T814XXA Infection following a procedure, initial encounter: Secondary | ICD-10-CM | POA: Diagnosis not present

## 2015-11-28 DIAGNOSIS — E1122 Type 2 diabetes mellitus with diabetic chronic kidney disease: Secondary | ICD-10-CM | POA: Insufficient documentation

## 2015-11-28 DIAGNOSIS — Y828 Other medical devices associated with adverse incidents: Secondary | ICD-10-CM | POA: Insufficient documentation

## 2015-11-28 DIAGNOSIS — I120 Hypertensive chronic kidney disease with stage 5 chronic kidney disease or end stage renal disease: Secondary | ICD-10-CM | POA: Insufficient documentation

## 2015-11-28 DIAGNOSIS — E119 Type 2 diabetes mellitus without complications: Secondary | ICD-10-CM | POA: Diagnosis not present

## 2015-11-28 DIAGNOSIS — M868X8 Other osteomyelitis, other site: Secondary | ICD-10-CM | POA: Diagnosis not present

## 2015-11-28 DIAGNOSIS — S81802A Unspecified open wound, left lower leg, initial encounter: Secondary | ICD-10-CM

## 2015-11-28 DIAGNOSIS — S81801A Unspecified open wound, right lower leg, initial encounter: Secondary | ICD-10-CM

## 2015-11-28 DIAGNOSIS — S81002A Unspecified open wound, left knee, initial encounter: Secondary | ICD-10-CM | POA: Diagnosis not present

## 2015-11-28 DIAGNOSIS — D631 Anemia in chronic kidney disease: Secondary | ICD-10-CM | POA: Diagnosis not present

## 2015-11-28 DIAGNOSIS — Z79899 Other long term (current) drug therapy: Secondary | ICD-10-CM | POA: Insufficient documentation

## 2015-11-28 DIAGNOSIS — Z89612 Acquired absence of left leg above knee: Secondary | ICD-10-CM | POA: Insufficient documentation

## 2015-11-28 DIAGNOSIS — Z87891 Personal history of nicotine dependence: Secondary | ICD-10-CM | POA: Insufficient documentation

## 2015-11-28 DIAGNOSIS — R630 Anorexia: Secondary | ICD-10-CM | POA: Diagnosis not present

## 2015-11-28 DIAGNOSIS — Z89611 Acquired absence of right leg above knee: Secondary | ICD-10-CM | POA: Insufficient documentation

## 2015-11-28 DIAGNOSIS — T8189XA Other complications of procedures, not elsewhere classified, initial encounter: Secondary | ICD-10-CM | POA: Diagnosis not present

## 2015-11-28 DIAGNOSIS — Z992 Dependence on renal dialysis: Secondary | ICD-10-CM | POA: Insufficient documentation

## 2015-11-28 DIAGNOSIS — S81001A Unspecified open wound, right knee, initial encounter: Secondary | ICD-10-CM | POA: Diagnosis not present

## 2015-11-28 DIAGNOSIS — N2581 Secondary hyperparathyroidism of renal origin: Secondary | ICD-10-CM | POA: Diagnosis not present

## 2015-11-28 LAB — I-STAT CHEM 8, ED
BUN: 5 mg/dL — ABNORMAL LOW (ref 6–20)
CALCIUM ION: 1.26 mmol/L (ref 1.15–1.40)
CREATININE: 1.9 mg/dL — AB (ref 0.44–1.00)
Chloride: 98 mmol/L — ABNORMAL LOW (ref 101–111)
Glucose, Bld: 74 mg/dL (ref 65–99)
HEMATOCRIT: 27 % — AB (ref 36.0–46.0)
HEMOGLOBIN: 9.2 g/dL — AB (ref 12.0–15.0)
Potassium: 2.8 mmol/L — ABNORMAL LOW (ref 3.5–5.1)
SODIUM: 136 mmol/L (ref 135–145)
TCO2: 29 mmol/L (ref 0–100)

## 2015-11-28 MED ORDER — POTASSIUM CHLORIDE CRYS ER 20 MEQ PO TBCR
40.0000 meq | EXTENDED_RELEASE_TABLET | Freq: Once | ORAL | Status: AC
  Administered 2015-11-28: 40 meq via ORAL
  Filled 2015-11-28: qty 2

## 2015-11-28 MED ORDER — LIDOCAINE HCL (PF) 1 % IJ SOLN
5.0000 mL | Freq: Once | INTRAMUSCULAR | Status: AC
  Administered 2015-11-28: 5 mL via INTRADERMAL
  Filled 2015-11-28: qty 5

## 2015-11-28 NOTE — ED Notes (Signed)
PTAR contacted to tx patient back to [facility]

## 2015-11-28 NOTE — ED Provider Notes (Signed)
[facility] DEPT Provider Note   CSN: [id number] Arrival date & time: 11/28/15  1831     History   Chief Complaint Chief Complaint  Patient presents with  . Wound Bleeding/drainage    HPI [name] is a 82 y.o. female.  Patient with, complicated medical history presents with bleeding from recent bilateral above-knee amputations. Patient's dialysis patient attending regularly. Patient denies other symptoms at this time area bleeding has improved since arrival. No syncope weakness or other symptoms. Patient has follow-up with surgeon next week.   The history is provided by the patient.    Past Medical History:  Diagnosis Date  . Anemia   . Arthritis   . Bilateral breast cancer ([place])   . DM (diabetes mellitus) ([place])   . Dyslipidemia   . ESRD (end stage renal disease) on dialysis ([place])    a. [place]: MWF (07/10/2014)  . Hepatitis C   . Hyperparathyroidism   . Hypertension   . PVD (peripheral vascular disease) ([place])    a. s/p peripheral angiogram on 07/10/14 with no PCI and cont med Rx  . Vascular disease     Patient Active Problem List   Diagnosis Date Noted  . Protein-calorie malnutrition, severe 11/20/2015  . Osteomyelitis ([place]) 11/17/2015  . Pressure ulcer 11/16/2015  . Absolute anemia 11/16/2015  . Acute post-hemorrhagic anemia 11/15/2015  . Bleeding from dialysis shunt ([place]) 11/15/2015  . Incontinence of feces 11/15/2015  . ESRD (end stage renal disease) on dialysis ([place])   . Hepatitis C   . Hypertension   . Dyslipidemia   . PVD (peripheral vascular disease) ([place]) 07/02/2014  . End stage renal disease ([place]) 08/08/2013  . Postoperative wound hematoma 08/08/2013  . Breast cancer of lower-outer quadrant of right female breast ([place]) 07/19/2013    Past Surgical History:  Procedure Laterality Date  . ABDOMINAL HYSTERECTOMY    . AMPUTATION Bilateral 11/21/2015   Procedure: AMPUTATION ABOVE KNEE;  Surgeon: Newt Minion, MD;  Location: [facility];  Service:  Orthopedics;  Laterality: Bilateral;  . APPENDECTOMY    . ARTERIOVENOUS GRAFT PLACEMENT Left 03/20/09   "had right arm previously but couldn't use it anymore"  . BACK SURGERY    . BREAST BIOPSY Bilateral   . BREAST BIOPSY Right 08/08/2013   Procedure: Evacuation Hematoma Right Chest;  Surgeon: [name], MD;  Location: [facility];  Service: General;  Laterality: Right;  . CATARACT EXTRACTION Right   . DG AV DIALYSIS GRAFT DECLOT OR Left 01/27/11, 02/15/11, 03/15/11, 10/13/11   lua  . EVACUATION BREAST HEMATOMA  2001; 2015   left; right  . INSERTION OF DIALYSIS CATHETER Right 11/15/2015   Procedure: INSERTION OF Right Femoral  DIALYSIS CATHETER.;  Surgeon: Rosetta Posner, MD;  Location: [facility];  Service: Vascular;  Laterality: Right;  . MASTECTOMY Left 1970's?  Marland Kitchen MASTECTOMY COMPLETE / SIMPLE Right 08/07/2013  . MEDIAN NERVE REPAIR Left    "decompression"  . PERIPHERAL VASCULAR CATHETERIZATION N/A 07/10/2014   Procedure: Abdominal Aortogram;  Surgeon: Wellington Hampshire, MD;  Location: [facility]UP[facility];  Service: Cardiovascular;  Laterality: N/A;  . POSTERIOR FUSION CERVICAL SPINE     "have 6 screws"  . THROMBECTOMY AND REVISION OF ARTERIOVENTOUS (AV) GORETEX  GRAFT Left 04/18/2009  . THROMBECTOMY AND REVISION OF ARTERIOVENTOUS (AV) GORETEX  GRAFT Left 11/15/2015   Procedure: Thrombectomy and Revision of Left ARm AV Gortex Graft.;  Surgeon: Rosetta Posner, MD;  Location: [facility];  Service: Vascular;  Laterality: Left;  . TOE AMPUTATION Right    1,2nd toes  . TONSILLECTOMY    . TOTAL MASTECTOMY Right 08/07/2013   Procedure: RIGHT TOTAL MASTECTOMY;  Surgeon: [name], MD;  Location: [facility];  Service: General;  Laterality: Right;  . UTERINE FIBROID SURGERY      OB History    No data available       Home Medications    Prior to Admission medications   Medication Sig Start Date End Date Taking? Authorizing Provider  acetaminophen (TYLENOL) 325 MG tablet Take 2 tablets (650 mg total) by  mouth every 6 (six) hours as needed for mild pain (or Fever >/= 101). 11/24/15   Silver Huguenin Elgergawy, MD  Amino Acids-Protein Hydrolys (FEEDING SUPPLEMENT, PRO-STAT SUGAR FREE 64,) LIQD Take 30 mLs by mouth 2 (two) times daily. 11/24/15   Silver Huguenin Elgergawy, MD  anastrozole (ARIMIDEX) 1 MG tablet Take 1 tablet (1 mg total) by mouth daily. Patient taking differently: Take 1 mg by mouth daily after supper.  06/17/15   Wyatt Portela, MD  B Complex-C-Folic Acid (DIALYVITE TABLET) TABS Take 1 tablet by mouth daily after supper. 10/31/15   Historical Provider, MD  cinacalcet (SENSIPAR) 60 MG tablet Take 60 mg by mouth daily after supper.    Historical Provider, MD  lanthanum (FOSRENOL) 1000 MG chewable tablet Chew 1,000 mg by mouth daily after supper.     Historical Provider, MD  methocarbamol (ROBAXIN) 500 MG tablet Take 1 tablet (500 mg total) by mouth every 6 (six) hours as needed for muscle spasms. 11/24/15   Silver Huguenin Elgergawy, MD  metoprolol succinate (TOPROL XL) 25 MG 24 hr tablet Take 1 tablet (25 mg total) by mouth daily. 11/24/15   Silver Huguenin Elgergawy, MD  Nutritional Supplements (FEEDING SUPPLEMENT, NEPRO CARB STEADY,) LIQD Take 237 mLs by mouth 2 (two) times daily between meals. 11/24/15   Silver Huguenin Elgergawy, MD  oxyCODONE-acetaminophen (PERCOCET/ROXICET) 5-325 MG tablet Take 1 tablet by mouth every 4 (four) hours as needed for severe pain. 11/24/15   Silver Huguenin Elgergawy, MD  traMADol (ULTRAM) 50 MG tablet Take 1 tablet (50 mg total) by mouth every 12 (twelve) hours as needed for moderate pain. 11/24/15   Albertine Patricia, MD    Family History Family History  Problem Relation Age of Onset  . Cancer Mother   . Hypertension Mother     Social History Social History  Substance Use Topics  . Smoking status: Former Smoker    Types: Cigarettes  . Smokeless tobacco: Never Used     Comment: "stopped smoking in 1969"  . Alcohol use 0.0 oz/week     Comment: "no alcohol since 1969"     Allergies     Aspirin   Review of Systems Review of Systems  Constitutional: Negative for chills and fever.  HENT: Negative for congestion.   Eyes: Negative for visual disturbance.  Respiratory: Negative for shortness of breath.   Cardiovascular: Negative for chest pain.  Gastrointestinal: Negative for abdominal pain and vomiting.  Genitourinary: Negative for dysuria and flank pain.  Musculoskeletal: Negative for back pain, neck pain and neck stiffness.  Skin: Positive for wound. Negative for rash.  Neurological: Negative for light-headedness and headaches.     Physical Exam Updated Vital Signs BP (!) 112/52 (BP Location: Right Arm)   Pulse 96   Temp 98 F (36.7 C) (Oral)   Resp 16   SpO2 99%   Physical Exam  Constitutional: She is  oriented to person, place, and time. She appears well-developed and well-nourished.  HENT:  Head: Normocephalic and atraumatic.  Eyes: Right eye exhibits no discharge. Left eye exhibits no discharge.  Neck: Normal range of motion. Neck supple.  Cardiovascular: Normal rate.   Pulmonary/Chest: Effort normal and breath sounds normal.  Abdominal: Soft.  Musculoskeletal: She exhibits no edema.  Neurological: She is alert and oriented to person, place, and time.  Skin: Skin is warm. No rash noted.  Patient has well-appearing and well healing postop wounds and sutures bilateral distal 5. No evidence of infection no drainage. No active bleeding. Patient does have 2 areas of mild gaping the distal right thigh.  Psychiatric: She has a normal mood and affect.  Nursing note and vitals reviewed.    ED Treatments / Results  Labs (all labs ordered are listed, but only abnormal results are displayed) Labs Reviewed  I-STAT CHEM 8, ED - Abnormal; Notable for the following:       Result Value   Potassium 2.8 (*)    Chloride 98 (*)    BUN 5 (*)    Creatinine, Ser 1.90 (*)    Hemoglobin 9.2 (*)    HCT 27.0 (*)    All other components within normal limits     EKG  EKG Interpretation None       Radiology No results found.  Procedures Procedures (including critical care time)  Medications Ordered in ED Medications  potassium chloride SA (K-DUR,KLOR-CON) CR tablet 40 mEq (not administered)  lidocaine (PF) (XYLOCAINE) 1 % injection 5 mL (5 mLs Intradermal Given 11/28/15 2004)     Initial Impression / Assessment and Plan / ED Course  I have reviewed the triage vital signs and the nursing notes.  Pertinent labs & imaging results that were available during my care of the patient were reviewed by me and considered in my medical decision making (see chart for details).  Clinical Course   REPAIR/ Wound repair Performed by: Mariea Clonts Authorized by: Mariea Clonts Consent: Verbal consent obtained. Risks and benefits: risks, benefits and alternatives were discussed Consent given by: patient Patient identity confirmed: provided demographic data Prepped and Draped in normal sterile fashion Wound explored  No Foreign Bodies seen or palpated Anesthesia: local infiltration Local anesthetic: lidocaine 1% w epinephrine Anesthetic total: 5 ml Amount of cleaning: standard  Skin closure: approximated Number of sutures: 2  Technique: interupted  Patient tolerance: Patient tolerated the procedure well with no immediate complications.  Patient presents after transient bleeding from postop wounds. Bleeding controlled in ER. 2 sutures placed. Patient observed hemoglobin at baseline. Mild low potassium patient has follow-up for her dialysis. Oral potassium given. Final Clinical Impressions(s) / ED Diagnoses   Final diagnoses:  Wound of left leg, initial encounter  Wound of right leg, initial encounter    New Prescriptions New Prescriptions   No medications on file     Elnora Morrison, MD 11/28/15 2123

## 2015-11-28 NOTE — ED Notes (Signed)
Pt verbalized understanding of d/c instructions and has no further questions. Pt d/c home with ptar back to [facility].

## 2015-11-28 NOTE — ED Notes (Signed)
Unable to obtain a spO2 with dinomap. Will reattempt when placed in room.

## 2015-11-28 NOTE — Discharge Instructions (Signed)
Call your surgeon on Monday. Return if unable to control bleeding despite pressure or if you feel like passing out.

## 2015-11-28 NOTE — ED Triage Notes (Signed)
Per EMS, pt from snf for excessive drainage from incisions on both legs after amputation surgeries to each leg last Friday. Facility reports increased bleeding through dressings over the last couple days. Bleeding appears controlled at this time. 12 lead showed Left BBB and PVC's. HR variable between 30 and 120 in route but EMS reported that when pulse palpated it was normal. Pt is Mon-Wed-Fri Dialysis and dialyzed today. Facility got 99.7 oral temp. BP 114/56, spo2 97% on RA. Pt alert and oriented x4.

## 2015-11-28 NOTE — ED Notes (Signed)
Suture cart placed at bedside for MD

## 2015-12-01 DIAGNOSIS — N2581 Secondary hyperparathyroidism of renal origin: Secondary | ICD-10-CM | POA: Diagnosis not present

## 2015-12-01 DIAGNOSIS — E119 Type 2 diabetes mellitus without complications: Secondary | ICD-10-CM | POA: Diagnosis not present

## 2015-12-01 DIAGNOSIS — D509 Iron deficiency anemia, unspecified: Secondary | ICD-10-CM | POA: Diagnosis not present

## 2015-12-01 DIAGNOSIS — D631 Anemia in chronic kidney disease: Secondary | ICD-10-CM | POA: Diagnosis not present

## 2015-12-01 DIAGNOSIS — Z992 Dependence on renal dialysis: Secondary | ICD-10-CM | POA: Diagnosis not present

## 2015-12-01 DIAGNOSIS — N186 End stage renal disease: Secondary | ICD-10-CM | POA: Diagnosis not present

## 2015-12-02 ENCOUNTER — Telehealth: Payer: Self-pay | Admitting: Vascular Surgery

## 2015-12-02 NOTE — Telephone Encounter (Signed)
[name] called and left voice msg regarding patient.  Pt had a procedure to transfer fistula site temporarily to R leg on recently.  Says she will need her dressings changed but wants to talk to Dr. Donnetta Hutching about next steps regarding care.  She will coordinate a time to come in to the office and arrange transportation from [facility] and call us back to make appt.  [name] says she has Power of Atty and is patient's relative.) [phone].   Ashleigh E. LPN.

## 2015-12-03 DIAGNOSIS — N2581 Secondary hyperparathyroidism of renal origin: Secondary | ICD-10-CM | POA: Diagnosis not present

## 2015-12-03 DIAGNOSIS — Z992 Dependence on renal dialysis: Secondary | ICD-10-CM | POA: Diagnosis not present

## 2015-12-03 DIAGNOSIS — D631 Anemia in chronic kidney disease: Secondary | ICD-10-CM | POA: Diagnosis not present

## 2015-12-03 DIAGNOSIS — N186 End stage renal disease: Secondary | ICD-10-CM | POA: Diagnosis not present

## 2015-12-03 DIAGNOSIS — E119 Type 2 diabetes mellitus without complications: Secondary | ICD-10-CM | POA: Diagnosis not present

## 2015-12-03 DIAGNOSIS — D509 Iron deficiency anemia, unspecified: Secondary | ICD-10-CM | POA: Diagnosis not present

## 2015-12-05 DIAGNOSIS — N186 End stage renal disease: Secondary | ICD-10-CM | POA: Diagnosis not present

## 2015-12-05 DIAGNOSIS — E119 Type 2 diabetes mellitus without complications: Secondary | ICD-10-CM | POA: Diagnosis not present

## 2015-12-05 DIAGNOSIS — Z992 Dependence on renal dialysis: Secondary | ICD-10-CM | POA: Diagnosis not present

## 2015-12-05 DIAGNOSIS — D509 Iron deficiency anemia, unspecified: Secondary | ICD-10-CM | POA: Diagnosis not present

## 2015-12-05 DIAGNOSIS — D631 Anemia in chronic kidney disease: Secondary | ICD-10-CM | POA: Diagnosis not present

## 2015-12-05 DIAGNOSIS — N2581 Secondary hyperparathyroidism of renal origin: Secondary | ICD-10-CM | POA: Diagnosis not present

## 2015-12-06 DIAGNOSIS — Z992 Dependence on renal dialysis: Secondary | ICD-10-CM | POA: Diagnosis not present

## 2015-12-06 DIAGNOSIS — N186 End stage renal disease: Secondary | ICD-10-CM | POA: Diagnosis not present

## 2015-12-06 DIAGNOSIS — E1129 Type 2 diabetes mellitus with other diabetic kidney complication: Secondary | ICD-10-CM | POA: Diagnosis not present

## 2015-12-08 ENCOUNTER — Inpatient Hospital Stay (INDEPENDENT_AMBULATORY_CARE_PROVIDER_SITE_OTHER): Payer: Medicare Other | Admitting: Orthopedic Surgery

## 2015-12-08 DIAGNOSIS — N186 End stage renal disease: Secondary | ICD-10-CM | POA: Diagnosis not present

## 2015-12-08 DIAGNOSIS — D631 Anemia in chronic kidney disease: Secondary | ICD-10-CM | POA: Diagnosis not present

## 2015-12-09 ENCOUNTER — Ambulatory Visit: Payer: Medicare Other | Admitting: Cardiovascular Disease

## 2015-12-10 DIAGNOSIS — D631 Anemia in chronic kidney disease: Secondary | ICD-10-CM | POA: Diagnosis not present

## 2015-12-10 DIAGNOSIS — N186 End stage renal disease: Secondary | ICD-10-CM | POA: Diagnosis not present

## 2015-12-12 DIAGNOSIS — M868X8 Other osteomyelitis, other site: Secondary | ICD-10-CM | POA: Diagnosis not present

## 2015-12-12 DIAGNOSIS — D631 Anemia in chronic kidney disease: Secondary | ICD-10-CM | POA: Diagnosis not present

## 2015-12-12 DIAGNOSIS — N186 End stage renal disease: Secondary | ICD-10-CM | POA: Diagnosis not present

## 2015-12-15 DIAGNOSIS — D631 Anemia in chronic kidney disease: Secondary | ICD-10-CM | POA: Diagnosis not present

## 2015-12-15 DIAGNOSIS — N186 End stage renal disease: Secondary | ICD-10-CM | POA: Diagnosis not present

## 2015-12-17 DIAGNOSIS — D631 Anemia in chronic kidney disease: Secondary | ICD-10-CM | POA: Diagnosis not present

## 2015-12-17 DIAGNOSIS — N186 End stage renal disease: Secondary | ICD-10-CM | POA: Diagnosis not present

## 2015-12-18 ENCOUNTER — Inpatient Hospital Stay (INDEPENDENT_AMBULATORY_CARE_PROVIDER_SITE_OTHER): Payer: Medicare Other | Admitting: Orthopedic Surgery

## 2015-12-19 DIAGNOSIS — D631 Anemia in chronic kidney disease: Secondary | ICD-10-CM | POA: Diagnosis not present

## 2015-12-19 DIAGNOSIS — N186 End stage renal disease: Secondary | ICD-10-CM | POA: Diagnosis not present

## 2015-12-22 ENCOUNTER — Inpatient Hospital Stay (INDEPENDENT_AMBULATORY_CARE_PROVIDER_SITE_OTHER): Payer: Medicare Other | Admitting: Orthopedic Surgery

## 2015-12-22 DIAGNOSIS — N186 End stage renal disease: Secondary | ICD-10-CM | POA: Diagnosis not present

## 2015-12-22 DIAGNOSIS — D631 Anemia in chronic kidney disease: Secondary | ICD-10-CM | POA: Diagnosis not present

## 2015-12-22 DIAGNOSIS — I70263 Atherosclerosis of native arteries of extremities with gangrene, bilateral legs: Secondary | ICD-10-CM

## 2015-12-24 ENCOUNTER — Telehealth: Payer: Self-pay

## 2015-12-24 DIAGNOSIS — N186 End stage renal disease: Secondary | ICD-10-CM | POA: Diagnosis not present

## 2015-12-24 DIAGNOSIS — M7989 Other specified soft tissue disorders: Secondary | ICD-10-CM

## 2015-12-24 DIAGNOSIS — D631 Anemia in chronic kidney disease: Secondary | ICD-10-CM | POA: Diagnosis not present

## 2015-12-24 NOTE — Telephone Encounter (Signed)
Phone call from pt's niece.  Reported the swelling of the left arm remains unchanged since her procedure 1 mo. ago. Reported the swelling starts in the hand and goes up to the shoulder.  The pt. denies pain, numbness, tingling, fever/ chills.  Reported "the arm feels heavy."   The niece stated the MD at the dialysis center is recommending to be re-evaluated.  Discussed with Dr. Donnetta Hutching.  Recommended a left UE venous duplex and office visit.  Will notify the niece of appt. Inform.

## 2015-12-25 ENCOUNTER — Encounter: Payer: Self-pay | Admitting: Vascular Surgery

## 2015-12-25 ENCOUNTER — Ambulatory Visit (INDEPENDENT_AMBULATORY_CARE_PROVIDER_SITE_OTHER): Payer: Medicare Other | Admitting: Vascular Surgery

## 2015-12-25 ENCOUNTER — Ambulatory Visit (HOSPITAL_COMMUNITY)
Admission: RE | Admit: 2015-12-25 | Discharge: 2015-12-25 | Disposition: A | Discharge status: Home / Self Care | Payer: No Typology Code available for payment source | Source: Ambulatory Visit | Attending: Vascular Surgery | Admitting: Vascular Surgery

## 2015-12-25 VITALS — BP 137/56 | HR 73 | Temp 99.3°F | Resp 14 | Ht 62.0 in | Wt 103.0 lb

## 2015-12-25 DIAGNOSIS — M7989 Other specified soft tissue disorders: Secondary | ICD-10-CM | POA: Insufficient documentation

## 2015-12-25 DIAGNOSIS — Z992 Dependence on renal dialysis: Secondary | ICD-10-CM

## 2015-12-25 DIAGNOSIS — N186 End stage renal disease: Secondary | ICD-10-CM

## 2015-12-25 NOTE — Progress Notes (Signed)
She is an 82 year old female who presented today with left upper extremity swelling after recent replacement of her left arm AV graft. At the time of her hospitalization she also had bilateral above-knee amputations by Dr. Sharol Given. The patient is in her wheelchair today and her left upper extremity is in a completely dependent position due to contracture of her left shoulder and significant spinal scoliosis which keeps her slouched over to the left. The graft was placed approximate 5 weeks ago. She denies any numbness or tingling in her hand. Swelling is primarily in the hand and forearm. Prior to having this graft placed she had a pre-existing left arm AV graft and did not really have any symptoms of central venous stenosis.  Physical exam:  Vitals:   12/25/15 1624  BP: (!) 137/56  Pulse: 73  Resp: 14  Temp: 99.3 F (37.4 C)  SpO2: 100%  Weight: 103 lb (46.7 kg)  Height: 5\' 2"  (1.575 m)    Extremities: Left upper extremity audible bruit and graft no significant upper extremity edema, left forearm and hand 2+ edema, patient has difficulty abducting her left shoulder and elevating her left hand and forearm. Her incisions are well-healed.  Assessment: Patent left upper extremity AV graft with forearm and hand edema most likely secondary to dependency rather than central vein stenosis.  Plan: I discussed with the patient and her daughter today elevation of her left arm to keep it above the level of her heart as much as practical and as long as practical. If the swelling is still persistent in 2 weeks we will consider a shuntogram. However, most likely should this does not represent central vein stenosis since she did not have this prior to her previous graft. At this point I believe it is okay to stick her left arm AV graft if the patient is okay with this from a pain standpoint. The patient will follow-up with [place] in 2 weeks to recheck the edema in her left arm.  Ruta Hinds, MD [facility] [place] Office: [phone] Pager: [phone]

## 2015-12-26 DIAGNOSIS — D631 Anemia in chronic kidney disease: Secondary | ICD-10-CM | POA: Diagnosis not present

## 2015-12-26 DIAGNOSIS — N186 End stage renal disease: Secondary | ICD-10-CM | POA: Diagnosis not present

## 2015-12-29 DIAGNOSIS — N186 End stage renal disease: Secondary | ICD-10-CM | POA: Diagnosis not present

## 2015-12-29 DIAGNOSIS — D631 Anemia in chronic kidney disease: Secondary | ICD-10-CM | POA: Diagnosis not present

## 2015-12-31 DIAGNOSIS — N186 End stage renal disease: Secondary | ICD-10-CM | POA: Diagnosis not present

## 2015-12-31 DIAGNOSIS — D631 Anemia in chronic kidney disease: Secondary | ICD-10-CM | POA: Diagnosis not present

## 2016-01-02 DIAGNOSIS — N186 End stage renal disease: Secondary | ICD-10-CM | POA: Diagnosis not present

## 2016-01-02 DIAGNOSIS — D631 Anemia in chronic kidney disease: Secondary | ICD-10-CM | POA: Diagnosis not present

## 2016-01-05 DIAGNOSIS — N186 End stage renal disease: Secondary | ICD-10-CM | POA: Diagnosis not present

## 2016-01-05 DIAGNOSIS — D631 Anemia in chronic kidney disease: Secondary | ICD-10-CM | POA: Diagnosis not present

## 2016-01-06 DIAGNOSIS — N186 End stage renal disease: Secondary | ICD-10-CM | POA: Diagnosis not present

## 2016-01-06 DIAGNOSIS — E1129 Type 2 diabetes mellitus with other diabetic kidney complication: Secondary | ICD-10-CM | POA: Diagnosis not present

## 2016-01-06 DIAGNOSIS — Z992 Dependence on renal dialysis: Secondary | ICD-10-CM | POA: Diagnosis not present

## 2016-01-07 ENCOUNTER — Encounter: Payer: Self-pay | Admitting: Vascular Surgery

## 2016-01-07 DIAGNOSIS — N186 End stage renal disease: Secondary | ICD-10-CM | POA: Diagnosis not present

## 2016-01-07 DIAGNOSIS — D631 Anemia in chronic kidney disease: Secondary | ICD-10-CM | POA: Diagnosis not present

## 2016-01-07 DIAGNOSIS — E119 Type 2 diabetes mellitus without complications: Secondary | ICD-10-CM | POA: Diagnosis not present

## 2016-01-07 DIAGNOSIS — D509 Iron deficiency anemia, unspecified: Secondary | ICD-10-CM | POA: Diagnosis not present

## 2016-01-08 ENCOUNTER — Ambulatory Visit (INDEPENDENT_AMBULATORY_CARE_PROVIDER_SITE_OTHER): Payer: Medicare Other | Admitting: Vascular Surgery

## 2016-01-08 ENCOUNTER — Encounter: Payer: Self-pay | Admitting: Vascular Surgery

## 2016-01-08 VITALS — BP 140/62 | HR 80 | Temp 97.3°F | Resp 18

## 2016-01-08 DIAGNOSIS — N186 End stage renal disease: Secondary | ICD-10-CM

## 2016-01-08 DIAGNOSIS — Z992 Dependence on renal dialysis: Secondary | ICD-10-CM

## 2016-01-08 NOTE — Progress Notes (Signed)
Pt is an 82 year old female who presented today for follow-up with left upper extremity swelling after recent replacement of her left arm AV graft. At the time of her hospitalization she also had bilateral above-knee amputations by Dr. Sharol Given. The patient is in her wheelchair today and her left upper extremity is in a completely dependent position due to contracture of her left shoulder and significant spinal scoliosis which keeps her slouched over to the left. The graft was placed approximate 7 weeks ago. She denies any numbness or tingling in her hand. Swelling is primarily in the hand and forearm. Prior to having this graft placed she had a pre-existing left arm AV graft and did not really have any symptoms of central venous stenosis.   Physical exam:    Vitals:   01/08/16 1520  BP: 140/62  Pulse: 80  Resp: 18  Temp: 97.3 F (36.3 C)  TempSrc: Oral  SpO2: 100%     Extremities: Left upper extremity audible bruit and graft no significant upper extremity edema, left forearm and hand 2+ edema, patient has difficulty abducting her left shoulder and elevating her left hand and forearm. Her incisions are well-healed.   Assessment: Patent left upper extremity AV graft with forearm and hand edema most likely secondary to dependency rather than central vein stenosis.   Plan: I again emphasized and discussed with the patient and her daughter today elevation of her left arm to keep it above the level of her heart as much as practical and as long as practical. If the swelling is persistent will consider a shuntogram. However, most likely should this does not represent central vein stenosis since she did not have this prior to her previous graft. At this point I believe it is okay to stick her left arm AV graft.  She will follow-up on an as-needed basis.   Ruta Hinds, MD [facility] Office: [phone] Pager: [phone]

## 2016-01-09 DIAGNOSIS — D509 Iron deficiency anemia, unspecified: Secondary | ICD-10-CM | POA: Diagnosis not present

## 2016-01-09 DIAGNOSIS — N186 End stage renal disease: Secondary | ICD-10-CM | POA: Diagnosis not present

## 2016-01-09 DIAGNOSIS — D631 Anemia in chronic kidney disease: Secondary | ICD-10-CM | POA: Diagnosis not present

## 2016-01-09 DIAGNOSIS — E119 Type 2 diabetes mellitus without complications: Secondary | ICD-10-CM | POA: Diagnosis not present

## 2016-01-12 DIAGNOSIS — N186 End stage renal disease: Secondary | ICD-10-CM | POA: Diagnosis not present

## 2016-01-12 DIAGNOSIS — D509 Iron deficiency anemia, unspecified: Secondary | ICD-10-CM | POA: Diagnosis not present

## 2016-01-12 DIAGNOSIS — D631 Anemia in chronic kidney disease: Secondary | ICD-10-CM | POA: Diagnosis not present

## 2016-01-12 DIAGNOSIS — E119 Type 2 diabetes mellitus without complications: Secondary | ICD-10-CM | POA: Diagnosis not present

## 2016-01-14 DIAGNOSIS — N186 End stage renal disease: Secondary | ICD-10-CM | POA: Diagnosis not present

## 2016-01-14 DIAGNOSIS — E119 Type 2 diabetes mellitus without complications: Secondary | ICD-10-CM | POA: Diagnosis not present

## 2016-01-14 DIAGNOSIS — D509 Iron deficiency anemia, unspecified: Secondary | ICD-10-CM | POA: Diagnosis not present

## 2016-01-14 DIAGNOSIS — D631 Anemia in chronic kidney disease: Secondary | ICD-10-CM | POA: Diagnosis not present

## 2016-01-16 DIAGNOSIS — N186 End stage renal disease: Secondary | ICD-10-CM | POA: Diagnosis not present

## 2016-01-16 DIAGNOSIS — D509 Iron deficiency anemia, unspecified: Secondary | ICD-10-CM | POA: Diagnosis not present

## 2016-01-16 DIAGNOSIS — D631 Anemia in chronic kidney disease: Secondary | ICD-10-CM | POA: Diagnosis not present

## 2016-01-16 DIAGNOSIS — E119 Type 2 diabetes mellitus without complications: Secondary | ICD-10-CM | POA: Diagnosis not present

## 2016-01-19 DIAGNOSIS — D631 Anemia in chronic kidney disease: Secondary | ICD-10-CM | POA: Diagnosis not present

## 2016-01-19 DIAGNOSIS — N186 End stage renal disease: Secondary | ICD-10-CM | POA: Diagnosis not present

## 2016-01-19 DIAGNOSIS — E119 Type 2 diabetes mellitus without complications: Secondary | ICD-10-CM | POA: Diagnosis not present

## 2016-01-19 DIAGNOSIS — D509 Iron deficiency anemia, unspecified: Secondary | ICD-10-CM | POA: Diagnosis not present

## 2016-01-21 DIAGNOSIS — E119 Type 2 diabetes mellitus without complications: Secondary | ICD-10-CM | POA: Diagnosis not present

## 2016-01-21 DIAGNOSIS — N186 End stage renal disease: Secondary | ICD-10-CM | POA: Diagnosis not present

## 2016-01-21 DIAGNOSIS — D509 Iron deficiency anemia, unspecified: Secondary | ICD-10-CM | POA: Diagnosis not present

## 2016-01-21 DIAGNOSIS — D631 Anemia in chronic kidney disease: Secondary | ICD-10-CM | POA: Diagnosis not present

## 2016-01-22 ENCOUNTER — Ambulatory Visit (INDEPENDENT_AMBULATORY_CARE_PROVIDER_SITE_OTHER): Payer: Medicare Other | Admitting: Orthopedic Surgery

## 2016-01-22 ENCOUNTER — Encounter (INDEPENDENT_AMBULATORY_CARE_PROVIDER_SITE_OTHER): Payer: Self-pay | Admitting: Orthopedic Surgery

## 2016-01-22 DIAGNOSIS — Z89612 Acquired absence of left leg above knee: Secondary | ICD-10-CM

## 2016-01-22 DIAGNOSIS — Z89611 Acquired absence of right leg above knee: Secondary | ICD-10-CM

## 2016-01-22 NOTE — Progress Notes (Signed)
Office Visit Note   Patient: [name]           Date of Birth: [birth year]           MRN: [id number] Visit Date: 01/22/2016              Requested by: [name], MD [address], [place] [address] PCP: [name], MD   Assessment & Plan: Visit Diagnoses:  1. S/P AKA (above knee amputation) bilateral (HCC)     Plan: Follow-up as needed. Patient will continue to follow up with [employer] for her stump shrinker's. Patient has one for the left she is not wearing it and she has a dialysis access line in her right thigh and this will need to be removed prior to wearing the shrinker on the right. Patient has just been cleared for dialysis through her left arm. Patient requested discharge to home she will continue working with therapy at skilled nursing for upper extremity strengthening transferred training and may discharge to home once she is safe with transfer training.  Follow-Up Instructions: Return if symptoms worsen or fail to improve.   Orders:  No orders of the defined types were placed in this encounter.  No orders of the defined types were placed in this encounter.     Procedures: No procedures performed   Clinical Data: No additional findings.   Subjective: Chief Complaint  Patient presents with  . Right Leg - Follow-up    11/21/15 Right above knee amputation  . Left Leg - Follow-up    Left Above knee amputation 11/21/15    Patient is here for bilateral above knee amputation 11/21/15. She is two months post op. She is still having problems with left above knee amputation. She is having left thigh pain. She has decreased strength in left leg and swelling. She has been measured by [employer] for her right prosthetic. She is wearing dry dressing bilaterally. She is wanting to get out of [facility] and wanting to see if they can transition her therapy to in the home.     Review of Systems   Objective: Vital Signs: There were no vitals taken for  this visit.  Physical Exam examination patient's wounds on both legs of healed well there is no swelling no cellulitis no drainage no signs of infection.  Ortho Exam  Specialty Comments:  No specialty comments available.  Imaging: No results found.   PMFS History: Patient Active Problem List   Diagnosis Date Noted  . Protein-calorie malnutrition, severe 11/20/2015  . Osteomyelitis ([place]) 11/17/2015  . Pressure ulcer 11/16/2015  . Absolute anemia 11/16/2015  . Acute post-hemorrhagic anemia 11/15/2015  . Bleeding from dialysis shunt ([place]) 11/15/2015  . Incontinence of feces 11/15/2015  . ESRD (end stage renal disease) on dialysis ([place])   . Hepatitis C   . Hypertension   . Dyslipidemia   . PVD (peripheral vascular disease) ([name]) 07/02/2014  . End stage renal disease ([place]) 08/08/2013  . Postoperative wound hematoma 08/08/2013  . Breast cancer of lower-outer quadrant of right female breast ([place]) 07/19/2013   Past Medical History:  Diagnosis Date  . Anemia   . Arthritis   . Bilateral breast cancer ([place])   . DM (diabetes mellitus) ([place])   . Dyslipidemia   . ESRD (end stage renal disease) on dialysis ([place])    a. [place]: MWF (07/10/2014)  . Hepatitis C   . Hyperparathyroidism   . Hypertension   . PVD (peripheral vascular disease) ([place])  a. s/p peripheral angiogram on 07/10/14 with no PCI and cont med Rx  . Vascular disease     Family History  Problem Relation Age of Onset  . Cancer Mother   . Hypertension Mother     Past Surgical History:  Procedure Laterality Date  . ABDOMINAL HYSTERECTOMY    . AMPUTATION Bilateral 11/21/2015   Procedure: AMPUTATION ABOVE KNEE;  Surgeon: Newt Minion, MD;  Location: [facility];  Service: Orthopedics;  Laterality: Bilateral;  . APPENDECTOMY    . ARTERIOVENOUS GRAFT PLACEMENT Left 03/20/09   "had right arm previously but couldn't use it anymore"  . BACK SURGERY    . BREAST BIOPSY Bilateral   . BREAST BIOPSY Right 08/08/2013   Procedure:  Evacuation Hematoma Right Chest;  Surgeon: [name], MD;  Location: [facility];  Service: General;  Laterality: Right;  . CATARACT EXTRACTION Right   . DG AV DIALYSIS GRAFT DECLOT OR Left 01/27/11, 02/15/11, 03/15/11, 10/13/11   lua  . EVACUATION BREAST HEMATOMA  2001; 2015   left; right  . INSERTION OF DIALYSIS CATHETER Right 11/15/2015   Procedure: INSERTION OF Right Femoral  DIALYSIS CATHETER.;  Surgeon: Rosetta Posner, MD;  Location: [facility];  Service: Vascular;  Laterality: Right;  . MASTECTOMY Left 1970's?  Marland Kitchen MASTECTOMY COMPLETE / SIMPLE Right 08/07/2013  . MEDIAN NERVE REPAIR Left    "decompression"  . PERIPHERAL VASCULAR CATHETERIZATION N/A 07/10/2014   Procedure: Abdominal Aortogram;  Surgeon: Wellington Hampshire, MD;  Location: [facility] INVASIVE CV LAB CUPID;  Service: Cardiovascular;  Laterality: N/A;  . POSTERIOR FUSION CERVICAL SPINE     "have 6 screws"  . THROMBECTOMY AND REVISION OF ARTERIOVENTOUS (AV) GORETEX  GRAFT Left 04/18/2009  . THROMBECTOMY AND REVISION OF ARTERIOVENTOUS (AV) GORETEX  GRAFT Left 11/15/2015   Procedure: Thrombectomy and Revision of Left ARm AV Gortex Graft.;  Surgeon: Rosetta Posner, MD;  Location: [facility];  Service: Vascular;  Laterality: Left;  . TOE AMPUTATION Right    1,2nd toes  . TONSILLECTOMY    . TOTAL MASTECTOMY Right 08/07/2013   Procedure: RIGHT TOTAL MASTECTOMY;  Surgeon: [name], MD;  Location: [facility];  Service: General;  Laterality: Right;  . UTERINE FIBROID SURGERY     Social History   Occupational History  . Not on file.   Social History Main Topics  . Smoking status: Former Smoker    Types: Cigarettes  . Smokeless tobacco: Never Used     Comment: "stopped smoking in 1969"  . Alcohol use 0.0 oz/week     Comment: "no alcohol since 1969"  . Drug use: No  . Sexual activity: No

## 2016-01-23 DIAGNOSIS — N186 End stage renal disease: Secondary | ICD-10-CM | POA: Diagnosis not present

## 2016-01-23 DIAGNOSIS — E119 Type 2 diabetes mellitus without complications: Secondary | ICD-10-CM | POA: Diagnosis not present

## 2016-01-23 DIAGNOSIS — D509 Iron deficiency anemia, unspecified: Secondary | ICD-10-CM | POA: Diagnosis not present

## 2016-01-23 DIAGNOSIS — D631 Anemia in chronic kidney disease: Secondary | ICD-10-CM | POA: Diagnosis not present

## 2016-01-25 DIAGNOSIS — D509 Iron deficiency anemia, unspecified: Secondary | ICD-10-CM | POA: Diagnosis not present

## 2016-01-25 DIAGNOSIS — D631 Anemia in chronic kidney disease: Secondary | ICD-10-CM | POA: Diagnosis not present

## 2016-01-25 DIAGNOSIS — E119 Type 2 diabetes mellitus without complications: Secondary | ICD-10-CM | POA: Diagnosis not present

## 2016-01-25 DIAGNOSIS — N186 End stage renal disease: Secondary | ICD-10-CM | POA: Diagnosis not present

## 2016-01-27 DIAGNOSIS — D509 Iron deficiency anemia, unspecified: Secondary | ICD-10-CM | POA: Diagnosis not present

## 2016-01-27 DIAGNOSIS — D631 Anemia in chronic kidney disease: Secondary | ICD-10-CM | POA: Diagnosis not present

## 2016-01-27 DIAGNOSIS — N186 End stage renal disease: Secondary | ICD-10-CM | POA: Diagnosis not present

## 2016-01-27 DIAGNOSIS — E119 Type 2 diabetes mellitus without complications: Secondary | ICD-10-CM | POA: Diagnosis not present

## 2016-01-28 ENCOUNTER — Telehealth (INDEPENDENT_AMBULATORY_CARE_PROVIDER_SITE_OTHER): Payer: Self-pay | Admitting: *Deleted

## 2016-01-28 NOTE — Telephone Encounter (Signed)
[facility] called stating pt needs medical referral for in home, home health assistance and therapy. [name] can pick this up when ready

## 2016-01-30 DIAGNOSIS — D631 Anemia in chronic kidney disease: Secondary | ICD-10-CM | POA: Diagnosis not present

## 2016-01-30 DIAGNOSIS — E119 Type 2 diabetes mellitus without complications: Secondary | ICD-10-CM | POA: Diagnosis not present

## 2016-01-30 DIAGNOSIS — D509 Iron deficiency anemia, unspecified: Secondary | ICD-10-CM | POA: Diagnosis not present

## 2016-01-30 DIAGNOSIS — N186 End stage renal disease: Secondary | ICD-10-CM | POA: Diagnosis not present

## 2016-02-02 DIAGNOSIS — E119 Type 2 diabetes mellitus without complications: Secondary | ICD-10-CM | POA: Diagnosis not present

## 2016-02-02 DIAGNOSIS — N186 End stage renal disease: Secondary | ICD-10-CM | POA: Diagnosis not present

## 2016-02-02 DIAGNOSIS — D509 Iron deficiency anemia, unspecified: Secondary | ICD-10-CM | POA: Diagnosis not present

## 2016-02-02 DIAGNOSIS — D631 Anemia in chronic kidney disease: Secondary | ICD-10-CM | POA: Diagnosis not present

## 2016-02-04 DIAGNOSIS — D631 Anemia in chronic kidney disease: Secondary | ICD-10-CM | POA: Diagnosis not present

## 2016-02-04 DIAGNOSIS — D509 Iron deficiency anemia, unspecified: Secondary | ICD-10-CM | POA: Diagnosis not present

## 2016-02-04 DIAGNOSIS — N186 End stage renal disease: Secondary | ICD-10-CM | POA: Diagnosis not present

## 2016-02-04 DIAGNOSIS — E119 Type 2 diabetes mellitus without complications: Secondary | ICD-10-CM | POA: Diagnosis not present

## 2016-02-05 DIAGNOSIS — E1129 Type 2 diabetes mellitus with other diabetic kidney complication: Secondary | ICD-10-CM | POA: Diagnosis not present

## 2016-02-05 DIAGNOSIS — N186 End stage renal disease: Secondary | ICD-10-CM | POA: Diagnosis not present

## 2016-02-05 DIAGNOSIS — Z992 Dependence on renal dialysis: Secondary | ICD-10-CM | POA: Diagnosis not present

## 2016-02-05 NOTE — Telephone Encounter (Signed)
Called and spoke with [name] and advised her I would start filling the form out and should have today or tomorrow but I would call her once completed.

## 2016-02-06 ENCOUNTER — Encounter (INDEPENDENT_AMBULATORY_CARE_PROVIDER_SITE_OTHER): Payer: Self-pay | Admitting: Radiology

## 2016-02-06 DIAGNOSIS — D631 Anemia in chronic kidney disease: Secondary | ICD-10-CM | POA: Diagnosis not present

## 2016-02-06 DIAGNOSIS — N2581 Secondary hyperparathyroidism of renal origin: Secondary | ICD-10-CM | POA: Diagnosis not present

## 2016-02-06 DIAGNOSIS — E119 Type 2 diabetes mellitus without complications: Secondary | ICD-10-CM | POA: Diagnosis not present

## 2016-02-06 DIAGNOSIS — D509 Iron deficiency anemia, unspecified: Secondary | ICD-10-CM | POA: Diagnosis not present

## 2016-02-06 DIAGNOSIS — N186 End stage renal disease: Secondary | ICD-10-CM | POA: Diagnosis not present

## 2016-02-09 ENCOUNTER — Encounter (INDEPENDENT_AMBULATORY_CARE_PROVIDER_SITE_OTHER): Payer: Self-pay | Admitting: Radiology

## 2016-02-09 DIAGNOSIS — D509 Iron deficiency anemia, unspecified: Secondary | ICD-10-CM | POA: Diagnosis not present

## 2016-02-09 DIAGNOSIS — N2581 Secondary hyperparathyroidism of renal origin: Secondary | ICD-10-CM | POA: Diagnosis not present

## 2016-02-09 DIAGNOSIS — E119 Type 2 diabetes mellitus without complications: Secondary | ICD-10-CM | POA: Diagnosis not present

## 2016-02-09 DIAGNOSIS — D631 Anemia in chronic kidney disease: Secondary | ICD-10-CM | POA: Diagnosis not present

## 2016-02-09 DIAGNOSIS — N186 End stage renal disease: Secondary | ICD-10-CM | POA: Diagnosis not present

## 2016-02-11 DIAGNOSIS — N2581 Secondary hyperparathyroidism of renal origin: Secondary | ICD-10-CM | POA: Diagnosis not present

## 2016-02-11 DIAGNOSIS — E119 Type 2 diabetes mellitus without complications: Secondary | ICD-10-CM | POA: Diagnosis not present

## 2016-02-11 DIAGNOSIS — N186 End stage renal disease: Secondary | ICD-10-CM | POA: Diagnosis not present

## 2016-02-11 DIAGNOSIS — D631 Anemia in chronic kidney disease: Secondary | ICD-10-CM | POA: Diagnosis not present

## 2016-02-11 DIAGNOSIS — D509 Iron deficiency anemia, unspecified: Secondary | ICD-10-CM | POA: Diagnosis not present

## 2016-02-12 ENCOUNTER — Telehealth: Payer: Self-pay | Admitting: *Deleted

## 2016-02-12 ENCOUNTER — Ambulatory Visit (INDEPENDENT_AMBULATORY_CARE_PROVIDER_SITE_OTHER): Payer: Medicare Other | Admitting: Orthopedic Surgery

## 2016-02-12 DIAGNOSIS — Z452 Encounter for adjustment and management of vascular access device: Secondary | ICD-10-CM | POA: Diagnosis not present

## 2016-02-12 NOTE — Telephone Encounter (Signed)
LMTCB-  Patient's POA, [name], called to request that Ms. Spangle's Right femoral cath be removed to facilitate the use of prosthetics [employer]). Also patient's left arm AVG was cleared for use by Dr. Oneida Alar on 01-08-16. Will wait to hear back from POA.  Right femoral catheter was placed at the same time she had Left arm AVG by Dr. Donnetta Hutching on 11-15-15.

## 2016-02-12 NOTE — Telephone Encounter (Signed)
Spoke to [place], [name] is currently having HD on M,W,F at [facility]. She said that they are using the Left arm AVG but today patient had to see Dr. Augustin Coupe for procedure because of clotting. I told her that I would contact the kidney center to see if they thought she was ready to have the thigh cath removed. I will call her back after I speak to the kidney center.

## 2016-02-13 DIAGNOSIS — D631 Anemia in chronic kidney disease: Secondary | ICD-10-CM | POA: Diagnosis not present

## 2016-02-13 DIAGNOSIS — N2581 Secondary hyperparathyroidism of renal origin: Secondary | ICD-10-CM | POA: Diagnosis not present

## 2016-02-13 DIAGNOSIS — N186 End stage renal disease: Secondary | ICD-10-CM | POA: Diagnosis not present

## 2016-02-13 DIAGNOSIS — D509 Iron deficiency anemia, unspecified: Secondary | ICD-10-CM | POA: Diagnosis not present

## 2016-02-13 DIAGNOSIS — E119 Type 2 diabetes mellitus without complications: Secondary | ICD-10-CM | POA: Diagnosis not present

## 2016-02-16 DIAGNOSIS — N2581 Secondary hyperparathyroidism of renal origin: Secondary | ICD-10-CM | POA: Diagnosis not present

## 2016-02-16 DIAGNOSIS — N186 End stage renal disease: Secondary | ICD-10-CM | POA: Diagnosis not present

## 2016-02-16 DIAGNOSIS — D509 Iron deficiency anemia, unspecified: Secondary | ICD-10-CM | POA: Diagnosis not present

## 2016-02-16 DIAGNOSIS — E119 Type 2 diabetes mellitus without complications: Secondary | ICD-10-CM | POA: Diagnosis not present

## 2016-02-16 DIAGNOSIS — D631 Anemia in chronic kidney disease: Secondary | ICD-10-CM | POA: Diagnosis not present

## 2016-02-18 DIAGNOSIS — E119 Type 2 diabetes mellitus without complications: Secondary | ICD-10-CM | POA: Diagnosis not present

## 2016-02-18 DIAGNOSIS — D631 Anemia in chronic kidney disease: Secondary | ICD-10-CM | POA: Diagnosis not present

## 2016-02-18 DIAGNOSIS — N186 End stage renal disease: Secondary | ICD-10-CM | POA: Diagnosis not present

## 2016-02-18 DIAGNOSIS — D509 Iron deficiency anemia, unspecified: Secondary | ICD-10-CM | POA: Diagnosis not present

## 2016-02-18 DIAGNOSIS — N2581 Secondary hyperparathyroidism of renal origin: Secondary | ICD-10-CM | POA: Diagnosis not present

## 2016-02-19 ENCOUNTER — Ambulatory Visit (INDEPENDENT_AMBULATORY_CARE_PROVIDER_SITE_OTHER): Payer: Medicare Other | Admitting: Orthopedic Surgery

## 2016-02-20 DIAGNOSIS — D509 Iron deficiency anemia, unspecified: Secondary | ICD-10-CM | POA: Diagnosis not present

## 2016-02-20 DIAGNOSIS — D631 Anemia in chronic kidney disease: Secondary | ICD-10-CM | POA: Diagnosis not present

## 2016-02-20 DIAGNOSIS — N186 End stage renal disease: Secondary | ICD-10-CM | POA: Diagnosis not present

## 2016-02-20 DIAGNOSIS — N2581 Secondary hyperparathyroidism of renal origin: Secondary | ICD-10-CM | POA: Diagnosis not present

## 2016-02-20 DIAGNOSIS — E119 Type 2 diabetes mellitus without complications: Secondary | ICD-10-CM | POA: Diagnosis not present

## 2016-02-23 DIAGNOSIS — E119 Type 2 diabetes mellitus without complications: Secondary | ICD-10-CM | POA: Diagnosis not present

## 2016-02-23 DIAGNOSIS — D631 Anemia in chronic kidney disease: Secondary | ICD-10-CM | POA: Diagnosis not present

## 2016-02-23 DIAGNOSIS — D509 Iron deficiency anemia, unspecified: Secondary | ICD-10-CM | POA: Diagnosis not present

## 2016-02-23 DIAGNOSIS — N2581 Secondary hyperparathyroidism of renal origin: Secondary | ICD-10-CM | POA: Diagnosis not present

## 2016-02-23 DIAGNOSIS — N186 End stage renal disease: Secondary | ICD-10-CM | POA: Diagnosis not present

## 2016-02-25 DIAGNOSIS — N2581 Secondary hyperparathyroidism of renal origin: Secondary | ICD-10-CM | POA: Diagnosis not present

## 2016-02-25 DIAGNOSIS — E119 Type 2 diabetes mellitus without complications: Secondary | ICD-10-CM | POA: Diagnosis not present

## 2016-02-25 DIAGNOSIS — D509 Iron deficiency anemia, unspecified: Secondary | ICD-10-CM | POA: Diagnosis not present

## 2016-02-25 DIAGNOSIS — N186 End stage renal disease: Secondary | ICD-10-CM | POA: Diagnosis not present

## 2016-02-25 DIAGNOSIS — D631 Anemia in chronic kidney disease: Secondary | ICD-10-CM | POA: Diagnosis not present

## 2016-02-27 DIAGNOSIS — E119 Type 2 diabetes mellitus without complications: Secondary | ICD-10-CM | POA: Diagnosis not present

## 2016-02-27 DIAGNOSIS — R5381 Other malaise: Secondary | ICD-10-CM | POA: Diagnosis not present

## 2016-02-27 DIAGNOSIS — D631 Anemia in chronic kidney disease: Secondary | ICD-10-CM | POA: Diagnosis not present

## 2016-02-27 DIAGNOSIS — N186 End stage renal disease: Secondary | ICD-10-CM | POA: Diagnosis not present

## 2016-02-27 DIAGNOSIS — N2581 Secondary hyperparathyroidism of renal origin: Secondary | ICD-10-CM | POA: Diagnosis not present

## 2016-02-27 DIAGNOSIS — I129 Hypertensive chronic kidney disease with stage 1 through stage 4 chronic kidney disease, or unspecified chronic kidney disease: Secondary | ICD-10-CM | POA: Diagnosis not present

## 2016-02-27 DIAGNOSIS — D509 Iron deficiency anemia, unspecified: Secondary | ICD-10-CM | POA: Diagnosis not present

## 2016-02-29 DIAGNOSIS — D509 Iron deficiency anemia, unspecified: Secondary | ICD-10-CM | POA: Diagnosis not present

## 2016-02-29 DIAGNOSIS — E119 Type 2 diabetes mellitus without complications: Secondary | ICD-10-CM | POA: Diagnosis not present

## 2016-02-29 DIAGNOSIS — N186 End stage renal disease: Secondary | ICD-10-CM | POA: Diagnosis not present

## 2016-02-29 DIAGNOSIS — N2581 Secondary hyperparathyroidism of renal origin: Secondary | ICD-10-CM | POA: Diagnosis not present

## 2016-02-29 DIAGNOSIS — D631 Anemia in chronic kidney disease: Secondary | ICD-10-CM | POA: Diagnosis not present

## 2016-03-03 ENCOUNTER — Telehealth (INDEPENDENT_AMBULATORY_CARE_PROVIDER_SITE_OTHER): Payer: Self-pay | Admitting: Orthopedic Surgery

## 2016-03-03 DIAGNOSIS — D509 Iron deficiency anemia, unspecified: Secondary | ICD-10-CM | POA: Diagnosis not present

## 2016-03-03 DIAGNOSIS — N186 End stage renal disease: Secondary | ICD-10-CM | POA: Diagnosis not present

## 2016-03-03 DIAGNOSIS — D631 Anemia in chronic kidney disease: Secondary | ICD-10-CM | POA: Diagnosis not present

## 2016-03-03 DIAGNOSIS — N2581 Secondary hyperparathyroidism of renal origin: Secondary | ICD-10-CM | POA: Diagnosis not present

## 2016-03-03 DIAGNOSIS — E119 Type 2 diabetes mellitus without complications: Secondary | ICD-10-CM | POA: Diagnosis not present

## 2016-03-03 NOTE — Telephone Encounter (Signed)
Patients daughter called in to request an order for a 16x16  wheel chair cushion, patient keeps sliding out of wheelchair [facility] [phone]  And update daughter [phone]

## 2016-03-04 DIAGNOSIS — M6281 Muscle weakness (generalized): Secondary | ICD-10-CM | POA: Diagnosis not present

## 2016-03-04 DIAGNOSIS — L89322 Pressure ulcer of left buttock, stage 2: Secondary | ICD-10-CM | POA: Diagnosis not present

## 2016-03-04 DIAGNOSIS — L89152 Pressure ulcer of sacral region, stage 2: Secondary | ICD-10-CM | POA: Diagnosis not present

## 2016-03-04 DIAGNOSIS — N186 End stage renal disease: Secondary | ICD-10-CM | POA: Diagnosis not present

## 2016-03-04 DIAGNOSIS — E1122 Type 2 diabetes mellitus with diabetic chronic kidney disease: Secondary | ICD-10-CM | POA: Diagnosis not present

## 2016-03-04 DIAGNOSIS — I120 Hypertensive chronic kidney disease with stage 5 chronic kidney disease or end stage renal disease: Secondary | ICD-10-CM | POA: Diagnosis not present

## 2016-03-04 NOTE — Telephone Encounter (Signed)
I called yesterday and advised this was faxed, she advised rx for cushion 16x16 only needed, this was done and refaxed to [facility].

## 2016-03-05 DIAGNOSIS — N2581 Secondary hyperparathyroidism of renal origin: Secondary | ICD-10-CM | POA: Diagnosis not present

## 2016-03-05 DIAGNOSIS — E119 Type 2 diabetes mellitus without complications: Secondary | ICD-10-CM | POA: Diagnosis not present

## 2016-03-05 DIAGNOSIS — E1122 Type 2 diabetes mellitus with diabetic chronic kidney disease: Secondary | ICD-10-CM | POA: Diagnosis not present

## 2016-03-05 DIAGNOSIS — L89322 Pressure ulcer of left buttock, stage 2: Secondary | ICD-10-CM | POA: Diagnosis not present

## 2016-03-05 DIAGNOSIS — D509 Iron deficiency anemia, unspecified: Secondary | ICD-10-CM | POA: Diagnosis not present

## 2016-03-05 DIAGNOSIS — M6281 Muscle weakness (generalized): Secondary | ICD-10-CM | POA: Diagnosis not present

## 2016-03-05 DIAGNOSIS — I120 Hypertensive chronic kidney disease with stage 5 chronic kidney disease or end stage renal disease: Secondary | ICD-10-CM | POA: Diagnosis not present

## 2016-03-05 DIAGNOSIS — D631 Anemia in chronic kidney disease: Secondary | ICD-10-CM | POA: Diagnosis not present

## 2016-03-05 DIAGNOSIS — N186 End stage renal disease: Secondary | ICD-10-CM | POA: Diagnosis not present

## 2016-03-05 DIAGNOSIS — L89152 Pressure ulcer of sacral region, stage 2: Secondary | ICD-10-CM | POA: Diagnosis not present

## 2016-03-07 DIAGNOSIS — Z992 Dependence on renal dialysis: Secondary | ICD-10-CM | POA: Diagnosis not present

## 2016-03-07 DIAGNOSIS — E1129 Type 2 diabetes mellitus with other diabetic kidney complication: Secondary | ICD-10-CM | POA: Diagnosis not present

## 2016-03-07 DIAGNOSIS — D631 Anemia in chronic kidney disease: Secondary | ICD-10-CM | POA: Diagnosis not present

## 2016-03-07 DIAGNOSIS — N2581 Secondary hyperparathyroidism of renal origin: Secondary | ICD-10-CM | POA: Diagnosis not present

## 2016-03-07 DIAGNOSIS — N186 End stage renal disease: Secondary | ICD-10-CM | POA: Diagnosis not present

## 2016-03-07 DIAGNOSIS — D509 Iron deficiency anemia, unspecified: Secondary | ICD-10-CM | POA: Diagnosis not present

## 2016-03-07 DIAGNOSIS — E119 Type 2 diabetes mellitus without complications: Secondary | ICD-10-CM | POA: Diagnosis not present

## 2016-03-09 DIAGNOSIS — E1122 Type 2 diabetes mellitus with diabetic chronic kidney disease: Secondary | ICD-10-CM | POA: Diagnosis not present

## 2016-03-09 DIAGNOSIS — N186 End stage renal disease: Secondary | ICD-10-CM | POA: Diagnosis not present

## 2016-03-09 DIAGNOSIS — M6281 Muscle weakness (generalized): Secondary | ICD-10-CM | POA: Diagnosis not present

## 2016-03-09 DIAGNOSIS — L89152 Pressure ulcer of sacral region, stage 2: Secondary | ICD-10-CM | POA: Diagnosis not present

## 2016-03-09 DIAGNOSIS — L89322 Pressure ulcer of left buttock, stage 2: Secondary | ICD-10-CM | POA: Diagnosis not present

## 2016-03-09 DIAGNOSIS — I120 Hypertensive chronic kidney disease with stage 5 chronic kidney disease or end stage renal disease: Secondary | ICD-10-CM | POA: Diagnosis not present

## 2016-03-10 DIAGNOSIS — D631 Anemia in chronic kidney disease: Secondary | ICD-10-CM | POA: Diagnosis not present

## 2016-03-10 DIAGNOSIS — N2581 Secondary hyperparathyroidism of renal origin: Secondary | ICD-10-CM | POA: Diagnosis not present

## 2016-03-10 DIAGNOSIS — D509 Iron deficiency anemia, unspecified: Secondary | ICD-10-CM | POA: Diagnosis not present

## 2016-03-10 DIAGNOSIS — N186 End stage renal disease: Secondary | ICD-10-CM | POA: Diagnosis not present

## 2016-03-10 DIAGNOSIS — E119 Type 2 diabetes mellitus without complications: Secondary | ICD-10-CM | POA: Diagnosis not present

## 2016-03-11 DIAGNOSIS — L89152 Pressure ulcer of sacral region, stage 2: Secondary | ICD-10-CM | POA: Diagnosis not present

## 2016-03-11 DIAGNOSIS — I120 Hypertensive chronic kidney disease with stage 5 chronic kidney disease or end stage renal disease: Secondary | ICD-10-CM | POA: Diagnosis not present

## 2016-03-11 DIAGNOSIS — E1122 Type 2 diabetes mellitus with diabetic chronic kidney disease: Secondary | ICD-10-CM | POA: Diagnosis not present

## 2016-03-11 DIAGNOSIS — M6281 Muscle weakness (generalized): Secondary | ICD-10-CM | POA: Diagnosis not present

## 2016-03-11 DIAGNOSIS — N186 End stage renal disease: Secondary | ICD-10-CM | POA: Diagnosis not present

## 2016-03-11 DIAGNOSIS — L89322 Pressure ulcer of left buttock, stage 2: Secondary | ICD-10-CM | POA: Diagnosis not present

## 2016-03-12 DIAGNOSIS — D631 Anemia in chronic kidney disease: Secondary | ICD-10-CM | POA: Diagnosis not present

## 2016-03-12 DIAGNOSIS — N186 End stage renal disease: Secondary | ICD-10-CM | POA: Diagnosis not present

## 2016-03-12 DIAGNOSIS — N2581 Secondary hyperparathyroidism of renal origin: Secondary | ICD-10-CM | POA: Diagnosis not present

## 2016-03-12 DIAGNOSIS — D509 Iron deficiency anemia, unspecified: Secondary | ICD-10-CM | POA: Diagnosis not present

## 2016-03-12 DIAGNOSIS — E119 Type 2 diabetes mellitus without complications: Secondary | ICD-10-CM | POA: Diagnosis not present

## 2016-03-15 DIAGNOSIS — E119 Type 2 diabetes mellitus without complications: Secondary | ICD-10-CM | POA: Diagnosis not present

## 2016-03-15 DIAGNOSIS — D509 Iron deficiency anemia, unspecified: Secondary | ICD-10-CM | POA: Diagnosis not present

## 2016-03-15 DIAGNOSIS — D631 Anemia in chronic kidney disease: Secondary | ICD-10-CM | POA: Diagnosis not present

## 2016-03-15 DIAGNOSIS — N2581 Secondary hyperparathyroidism of renal origin: Secondary | ICD-10-CM | POA: Diagnosis not present

## 2016-03-15 DIAGNOSIS — N186 End stage renal disease: Secondary | ICD-10-CM | POA: Diagnosis not present

## 2016-03-16 DIAGNOSIS — E1122 Type 2 diabetes mellitus with diabetic chronic kidney disease: Secondary | ICD-10-CM | POA: Diagnosis not present

## 2016-03-16 DIAGNOSIS — L89152 Pressure ulcer of sacral region, stage 2: Secondary | ICD-10-CM | POA: Diagnosis not present

## 2016-03-16 DIAGNOSIS — I120 Hypertensive chronic kidney disease with stage 5 chronic kidney disease or end stage renal disease: Secondary | ICD-10-CM | POA: Diagnosis not present

## 2016-03-16 DIAGNOSIS — L89322 Pressure ulcer of left buttock, stage 2: Secondary | ICD-10-CM | POA: Diagnosis not present

## 2016-03-16 DIAGNOSIS — M6281 Muscle weakness (generalized): Secondary | ICD-10-CM | POA: Diagnosis not present

## 2016-03-16 DIAGNOSIS — N186 End stage renal disease: Secondary | ICD-10-CM | POA: Diagnosis not present

## 2016-03-17 DIAGNOSIS — E119 Type 2 diabetes mellitus without complications: Secondary | ICD-10-CM | POA: Diagnosis not present

## 2016-03-17 DIAGNOSIS — D631 Anemia in chronic kidney disease: Secondary | ICD-10-CM | POA: Diagnosis not present

## 2016-03-17 DIAGNOSIS — N186 End stage renal disease: Secondary | ICD-10-CM | POA: Diagnosis not present

## 2016-03-17 DIAGNOSIS — N2581 Secondary hyperparathyroidism of renal origin: Secondary | ICD-10-CM | POA: Diagnosis not present

## 2016-03-17 DIAGNOSIS — D509 Iron deficiency anemia, unspecified: Secondary | ICD-10-CM | POA: Diagnosis not present

## 2016-03-18 DIAGNOSIS — L89322 Pressure ulcer of left buttock, stage 2: Secondary | ICD-10-CM | POA: Diagnosis not present

## 2016-03-18 DIAGNOSIS — N186 End stage renal disease: Secondary | ICD-10-CM | POA: Diagnosis not present

## 2016-03-18 DIAGNOSIS — I120 Hypertensive chronic kidney disease with stage 5 chronic kidney disease or end stage renal disease: Secondary | ICD-10-CM | POA: Diagnosis not present

## 2016-03-18 DIAGNOSIS — L89152 Pressure ulcer of sacral region, stage 2: Secondary | ICD-10-CM | POA: Diagnosis not present

## 2016-03-18 DIAGNOSIS — M6281 Muscle weakness (generalized): Secondary | ICD-10-CM | POA: Diagnosis not present

## 2016-03-18 DIAGNOSIS — E1122 Type 2 diabetes mellitus with diabetic chronic kidney disease: Secondary | ICD-10-CM | POA: Diagnosis not present

## 2016-03-20 DIAGNOSIS — N2581 Secondary hyperparathyroidism of renal origin: Secondary | ICD-10-CM | POA: Diagnosis not present

## 2016-03-20 DIAGNOSIS — D631 Anemia in chronic kidney disease: Secondary | ICD-10-CM | POA: Diagnosis not present

## 2016-03-20 DIAGNOSIS — N186 End stage renal disease: Secondary | ICD-10-CM | POA: Diagnosis not present

## 2016-03-20 DIAGNOSIS — E119 Type 2 diabetes mellitus without complications: Secondary | ICD-10-CM | POA: Diagnosis not present

## 2016-03-20 DIAGNOSIS — D509 Iron deficiency anemia, unspecified: Secondary | ICD-10-CM | POA: Diagnosis not present

## 2016-03-22 DIAGNOSIS — N186 End stage renal disease: Secondary | ICD-10-CM | POA: Diagnosis not present

## 2016-03-22 DIAGNOSIS — D509 Iron deficiency anemia, unspecified: Secondary | ICD-10-CM | POA: Diagnosis not present

## 2016-03-22 DIAGNOSIS — E119 Type 2 diabetes mellitus without complications: Secondary | ICD-10-CM | POA: Diagnosis not present

## 2016-03-22 DIAGNOSIS — D631 Anemia in chronic kidney disease: Secondary | ICD-10-CM | POA: Diagnosis not present

## 2016-03-22 DIAGNOSIS — N2581 Secondary hyperparathyroidism of renal origin: Secondary | ICD-10-CM | POA: Diagnosis not present

## 2016-03-23 DIAGNOSIS — M6281 Muscle weakness (generalized): Secondary | ICD-10-CM | POA: Diagnosis not present

## 2016-03-23 DIAGNOSIS — N186 End stage renal disease: Secondary | ICD-10-CM | POA: Diagnosis not present

## 2016-03-23 DIAGNOSIS — E1122 Type 2 diabetes mellitus with diabetic chronic kidney disease: Secondary | ICD-10-CM | POA: Diagnosis not present

## 2016-03-23 DIAGNOSIS — I120 Hypertensive chronic kidney disease with stage 5 chronic kidney disease or end stage renal disease: Secondary | ICD-10-CM | POA: Diagnosis not present

## 2016-03-23 DIAGNOSIS — L89152 Pressure ulcer of sacral region, stage 2: Secondary | ICD-10-CM | POA: Diagnosis not present

## 2016-03-23 DIAGNOSIS — L89322 Pressure ulcer of left buttock, stage 2: Secondary | ICD-10-CM | POA: Diagnosis not present

## 2016-03-24 DIAGNOSIS — N2581 Secondary hyperparathyroidism of renal origin: Secondary | ICD-10-CM | POA: Diagnosis not present

## 2016-03-24 DIAGNOSIS — D509 Iron deficiency anemia, unspecified: Secondary | ICD-10-CM | POA: Diagnosis not present

## 2016-03-24 DIAGNOSIS — D631 Anemia in chronic kidney disease: Secondary | ICD-10-CM | POA: Diagnosis not present

## 2016-03-24 DIAGNOSIS — E119 Type 2 diabetes mellitus without complications: Secondary | ICD-10-CM | POA: Diagnosis not present

## 2016-03-24 DIAGNOSIS — N186 End stage renal disease: Secondary | ICD-10-CM | POA: Diagnosis not present

## 2016-03-27 DIAGNOSIS — E119 Type 2 diabetes mellitus without complications: Secondary | ICD-10-CM | POA: Diagnosis not present

## 2016-03-27 DIAGNOSIS — D509 Iron deficiency anemia, unspecified: Secondary | ICD-10-CM | POA: Diagnosis not present

## 2016-03-27 DIAGNOSIS — D631 Anemia in chronic kidney disease: Secondary | ICD-10-CM | POA: Diagnosis not present

## 2016-03-27 DIAGNOSIS — N2581 Secondary hyperparathyroidism of renal origin: Secondary | ICD-10-CM | POA: Diagnosis not present

## 2016-03-27 DIAGNOSIS — N186 End stage renal disease: Secondary | ICD-10-CM | POA: Diagnosis not present

## 2016-03-29 DIAGNOSIS — E119 Type 2 diabetes mellitus without complications: Secondary | ICD-10-CM | POA: Diagnosis not present

## 2016-03-29 DIAGNOSIS — D631 Anemia in chronic kidney disease: Secondary | ICD-10-CM | POA: Diagnosis not present

## 2016-03-29 DIAGNOSIS — N186 End stage renal disease: Secondary | ICD-10-CM | POA: Diagnosis not present

## 2016-03-29 DIAGNOSIS — D509 Iron deficiency anemia, unspecified: Secondary | ICD-10-CM | POA: Diagnosis not present

## 2016-03-29 DIAGNOSIS — N2581 Secondary hyperparathyroidism of renal origin: Secondary | ICD-10-CM | POA: Diagnosis not present

## 2016-03-30 DIAGNOSIS — N186 End stage renal disease: Secondary | ICD-10-CM | POA: Diagnosis not present

## 2016-03-30 DIAGNOSIS — L89152 Pressure ulcer of sacral region, stage 2: Secondary | ICD-10-CM | POA: Diagnosis not present

## 2016-03-30 DIAGNOSIS — L89322 Pressure ulcer of left buttock, stage 2: Secondary | ICD-10-CM | POA: Diagnosis not present

## 2016-03-30 DIAGNOSIS — I120 Hypertensive chronic kidney disease with stage 5 chronic kidney disease or end stage renal disease: Secondary | ICD-10-CM | POA: Diagnosis not present

## 2016-03-30 DIAGNOSIS — E1122 Type 2 diabetes mellitus with diabetic chronic kidney disease: Secondary | ICD-10-CM | POA: Diagnosis not present

## 2016-03-30 DIAGNOSIS — M6281 Muscle weakness (generalized): Secondary | ICD-10-CM | POA: Diagnosis not present

## 2016-03-31 DIAGNOSIS — E1129 Type 2 diabetes mellitus with other diabetic kidney complication: Secondary | ICD-10-CM | POA: Diagnosis not present

## 2016-03-31 DIAGNOSIS — N186 End stage renal disease: Secondary | ICD-10-CM | POA: Diagnosis not present

## 2016-03-31 DIAGNOSIS — D631 Anemia in chronic kidney disease: Secondary | ICD-10-CM | POA: Diagnosis not present

## 2016-03-31 DIAGNOSIS — E119 Type 2 diabetes mellitus without complications: Secondary | ICD-10-CM | POA: Diagnosis not present

## 2016-03-31 DIAGNOSIS — N2581 Secondary hyperparathyroidism of renal origin: Secondary | ICD-10-CM | POA: Diagnosis not present

## 2016-03-31 DIAGNOSIS — D509 Iron deficiency anemia, unspecified: Secondary | ICD-10-CM | POA: Diagnosis not present

## 2016-04-01 DIAGNOSIS — M6281 Muscle weakness (generalized): Secondary | ICD-10-CM | POA: Diagnosis not present

## 2016-04-01 DIAGNOSIS — I120 Hypertensive chronic kidney disease with stage 5 chronic kidney disease or end stage renal disease: Secondary | ICD-10-CM | POA: Diagnosis not present

## 2016-04-01 DIAGNOSIS — L89152 Pressure ulcer of sacral region, stage 2: Secondary | ICD-10-CM | POA: Diagnosis not present

## 2016-04-01 DIAGNOSIS — N186 End stage renal disease: Secondary | ICD-10-CM | POA: Diagnosis not present

## 2016-04-01 DIAGNOSIS — E1122 Type 2 diabetes mellitus with diabetic chronic kidney disease: Secondary | ICD-10-CM | POA: Diagnosis not present

## 2016-04-01 DIAGNOSIS — L89322 Pressure ulcer of left buttock, stage 2: Secondary | ICD-10-CM | POA: Diagnosis not present

## 2016-04-02 DIAGNOSIS — D631 Anemia in chronic kidney disease: Secondary | ICD-10-CM | POA: Diagnosis not present

## 2016-04-02 DIAGNOSIS — N2581 Secondary hyperparathyroidism of renal origin: Secondary | ICD-10-CM | POA: Diagnosis not present

## 2016-04-02 DIAGNOSIS — N186 End stage renal disease: Secondary | ICD-10-CM | POA: Diagnosis not present

## 2016-04-02 DIAGNOSIS — D509 Iron deficiency anemia, unspecified: Secondary | ICD-10-CM | POA: Diagnosis not present

## 2016-04-02 DIAGNOSIS — E119 Type 2 diabetes mellitus without complications: Secondary | ICD-10-CM | POA: Diagnosis not present

## 2016-04-05 DIAGNOSIS — D509 Iron deficiency anemia, unspecified: Secondary | ICD-10-CM | POA: Diagnosis not present

## 2016-04-05 DIAGNOSIS — E119 Type 2 diabetes mellitus without complications: Secondary | ICD-10-CM | POA: Diagnosis not present

## 2016-04-05 DIAGNOSIS — D631 Anemia in chronic kidney disease: Secondary | ICD-10-CM | POA: Diagnosis not present

## 2016-04-05 DIAGNOSIS — N2581 Secondary hyperparathyroidism of renal origin: Secondary | ICD-10-CM | POA: Diagnosis not present

## 2016-04-05 DIAGNOSIS — N186 End stage renal disease: Secondary | ICD-10-CM | POA: Diagnosis not present

## 2016-04-06 DIAGNOSIS — L89322 Pressure ulcer of left buttock, stage 2: Secondary | ICD-10-CM | POA: Diagnosis not present

## 2016-04-06 DIAGNOSIS — N186 End stage renal disease: Secondary | ICD-10-CM | POA: Diagnosis not present

## 2016-04-06 DIAGNOSIS — E1122 Type 2 diabetes mellitus with diabetic chronic kidney disease: Secondary | ICD-10-CM | POA: Diagnosis not present

## 2016-04-06 DIAGNOSIS — M6281 Muscle weakness (generalized): Secondary | ICD-10-CM | POA: Diagnosis not present

## 2016-04-06 DIAGNOSIS — L89152 Pressure ulcer of sacral region, stage 2: Secondary | ICD-10-CM | POA: Diagnosis not present

## 2016-04-06 DIAGNOSIS — I120 Hypertensive chronic kidney disease with stage 5 chronic kidney disease or end stage renal disease: Secondary | ICD-10-CM | POA: Diagnosis not present

## 2016-04-07 DIAGNOSIS — N186 End stage renal disease: Secondary | ICD-10-CM | POA: Diagnosis not present

## 2016-04-07 DIAGNOSIS — D631 Anemia in chronic kidney disease: Secondary | ICD-10-CM | POA: Diagnosis not present

## 2016-04-07 DIAGNOSIS — Z992 Dependence on renal dialysis: Secondary | ICD-10-CM | POA: Diagnosis not present

## 2016-04-07 DIAGNOSIS — E1129 Type 2 diabetes mellitus with other diabetic kidney complication: Secondary | ICD-10-CM | POA: Diagnosis not present

## 2016-04-07 DIAGNOSIS — N2581 Secondary hyperparathyroidism of renal origin: Secondary | ICD-10-CM | POA: Diagnosis not present

## 2016-04-07 DIAGNOSIS — D509 Iron deficiency anemia, unspecified: Secondary | ICD-10-CM | POA: Diagnosis not present

## 2016-04-07 DIAGNOSIS — E119 Type 2 diabetes mellitus without complications: Secondary | ICD-10-CM | POA: Diagnosis not present

## 2016-04-08 DIAGNOSIS — I120 Hypertensive chronic kidney disease with stage 5 chronic kidney disease or end stage renal disease: Secondary | ICD-10-CM | POA: Diagnosis not present

## 2016-04-08 DIAGNOSIS — E1122 Type 2 diabetes mellitus with diabetic chronic kidney disease: Secondary | ICD-10-CM | POA: Diagnosis not present

## 2016-04-08 DIAGNOSIS — M6281 Muscle weakness (generalized): Secondary | ICD-10-CM | POA: Diagnosis not present

## 2016-04-08 DIAGNOSIS — L89322 Pressure ulcer of left buttock, stage 2: Secondary | ICD-10-CM | POA: Diagnosis not present

## 2016-04-08 DIAGNOSIS — N186 End stage renal disease: Secondary | ICD-10-CM | POA: Diagnosis not present

## 2016-04-08 DIAGNOSIS — L89152 Pressure ulcer of sacral region, stage 2: Secondary | ICD-10-CM | POA: Diagnosis not present

## 2016-04-09 DIAGNOSIS — E119 Type 2 diabetes mellitus without complications: Secondary | ICD-10-CM | POA: Diagnosis not present

## 2016-04-09 DIAGNOSIS — N2581 Secondary hyperparathyroidism of renal origin: Secondary | ICD-10-CM | POA: Diagnosis not present

## 2016-04-09 DIAGNOSIS — D631 Anemia in chronic kidney disease: Secondary | ICD-10-CM | POA: Diagnosis not present

## 2016-04-09 DIAGNOSIS — D509 Iron deficiency anemia, unspecified: Secondary | ICD-10-CM | POA: Diagnosis not present

## 2016-04-09 DIAGNOSIS — N186 End stage renal disease: Secondary | ICD-10-CM | POA: Diagnosis not present

## 2016-04-12 DIAGNOSIS — N186 End stage renal disease: Secondary | ICD-10-CM | POA: Diagnosis not present

## 2016-04-12 DIAGNOSIS — D631 Anemia in chronic kidney disease: Secondary | ICD-10-CM | POA: Diagnosis not present

## 2016-04-12 DIAGNOSIS — D509 Iron deficiency anemia, unspecified: Secondary | ICD-10-CM | POA: Diagnosis not present

## 2016-04-12 DIAGNOSIS — E119 Type 2 diabetes mellitus without complications: Secondary | ICD-10-CM | POA: Diagnosis not present

## 2016-04-12 DIAGNOSIS — N2581 Secondary hyperparathyroidism of renal origin: Secondary | ICD-10-CM | POA: Diagnosis not present

## 2016-04-14 DIAGNOSIS — N186 End stage renal disease: Secondary | ICD-10-CM | POA: Diagnosis not present

## 2016-04-14 DIAGNOSIS — E119 Type 2 diabetes mellitus without complications: Secondary | ICD-10-CM | POA: Diagnosis not present

## 2016-04-14 DIAGNOSIS — D631 Anemia in chronic kidney disease: Secondary | ICD-10-CM | POA: Diagnosis not present

## 2016-04-14 DIAGNOSIS — N2581 Secondary hyperparathyroidism of renal origin: Secondary | ICD-10-CM | POA: Diagnosis not present

## 2016-04-14 DIAGNOSIS — D509 Iron deficiency anemia, unspecified: Secondary | ICD-10-CM | POA: Diagnosis not present

## 2016-04-15 DIAGNOSIS — M6281 Muscle weakness (generalized): Secondary | ICD-10-CM | POA: Diagnosis not present

## 2016-04-15 DIAGNOSIS — N186 End stage renal disease: Secondary | ICD-10-CM | POA: Diagnosis not present

## 2016-04-15 DIAGNOSIS — E1122 Type 2 diabetes mellitus with diabetic chronic kidney disease: Secondary | ICD-10-CM | POA: Diagnosis not present

## 2016-04-15 DIAGNOSIS — L89322 Pressure ulcer of left buttock, stage 2: Secondary | ICD-10-CM | POA: Diagnosis not present

## 2016-04-15 DIAGNOSIS — L89152 Pressure ulcer of sacral region, stage 2: Secondary | ICD-10-CM | POA: Diagnosis not present

## 2016-04-15 DIAGNOSIS — I120 Hypertensive chronic kidney disease with stage 5 chronic kidney disease or end stage renal disease: Secondary | ICD-10-CM | POA: Diagnosis not present

## 2016-04-16 DIAGNOSIS — D631 Anemia in chronic kidney disease: Secondary | ICD-10-CM | POA: Diagnosis not present

## 2016-04-16 DIAGNOSIS — D509 Iron deficiency anemia, unspecified: Secondary | ICD-10-CM | POA: Diagnosis not present

## 2016-04-16 DIAGNOSIS — E119 Type 2 diabetes mellitus without complications: Secondary | ICD-10-CM | POA: Diagnosis not present

## 2016-04-16 DIAGNOSIS — N2581 Secondary hyperparathyroidism of renal origin: Secondary | ICD-10-CM | POA: Diagnosis not present

## 2016-04-16 DIAGNOSIS — N186 End stage renal disease: Secondary | ICD-10-CM | POA: Diagnosis not present

## 2016-04-19 DIAGNOSIS — N2581 Secondary hyperparathyroidism of renal origin: Secondary | ICD-10-CM | POA: Diagnosis not present

## 2016-04-19 DIAGNOSIS — N186 End stage renal disease: Secondary | ICD-10-CM | POA: Diagnosis not present

## 2016-04-19 DIAGNOSIS — E119 Type 2 diabetes mellitus without complications: Secondary | ICD-10-CM | POA: Diagnosis not present

## 2016-04-19 DIAGNOSIS — D509 Iron deficiency anemia, unspecified: Secondary | ICD-10-CM | POA: Diagnosis not present

## 2016-04-19 DIAGNOSIS — D631 Anemia in chronic kidney disease: Secondary | ICD-10-CM | POA: Diagnosis not present

## 2016-04-20 ENCOUNTER — Telehealth: Payer: Self-pay | Admitting: Oncology

## 2016-04-20 ENCOUNTER — Ambulatory Visit (HOSPITAL_BASED_OUTPATIENT_CLINIC_OR_DEPARTMENT_OTHER): Payer: Medicare Other | Admitting: Oncology

## 2016-04-20 ENCOUNTER — Other Ambulatory Visit: Payer: Medicare Other

## 2016-04-20 VITALS — BP 128/69 | HR 76 | Temp 98.2°F | Resp 17 | Ht 62.0 in | Wt 117.5 lb

## 2016-04-20 DIAGNOSIS — C50511 Malignant neoplasm of lower-outer quadrant of right female breast: Secondary | ICD-10-CM | POA: Diagnosis not present

## 2016-04-20 DIAGNOSIS — D649 Anemia, unspecified: Secondary | ICD-10-CM

## 2016-04-20 DIAGNOSIS — N186 End stage renal disease: Secondary | ICD-10-CM | POA: Diagnosis not present

## 2016-04-20 DIAGNOSIS — L89152 Pressure ulcer of sacral region, stage 2: Secondary | ICD-10-CM | POA: Diagnosis not present

## 2016-04-20 DIAGNOSIS — L89322 Pressure ulcer of left buttock, stage 2: Secondary | ICD-10-CM | POA: Diagnosis not present

## 2016-04-20 DIAGNOSIS — N289 Disorder of kidney and ureter, unspecified: Secondary | ICD-10-CM | POA: Diagnosis not present

## 2016-04-20 DIAGNOSIS — I120 Hypertensive chronic kidney disease with stage 5 chronic kidney disease or end stage renal disease: Secondary | ICD-10-CM | POA: Diagnosis not present

## 2016-04-20 DIAGNOSIS — M6281 Muscle weakness (generalized): Secondary | ICD-10-CM | POA: Diagnosis not present

## 2016-04-20 DIAGNOSIS — E1122 Type 2 diabetes mellitus with diabetic chronic kidney disease: Secondary | ICD-10-CM | POA: Diagnosis not present

## 2016-04-20 NOTE — Progress Notes (Signed)
Hematology and Oncology Follow Up Visit  [name] [id number] 1932/11/10 83 y.o. 04/20/2016 11:27 AM [name], MDGoldsborough, Heather Keto, MD   Principle Diagnosis: 83 year old woman with the diagnosis of invasive ductal carcinoma of the right breast diagnosed in April 2015. She presented with a 3.1 cm mass in the right lower breast. Tumor was found to be ER positive, PR negative HER-2 negative. She also has a remote history of left sided breast cancer.  Prior Therapy: She underwent a right total mastectomy under the care of [name] on 08/07/2013. The pathology confirmed the presence of 3.5 cm invasive ductal carcinoma grade I/III with negative surgical margins. She was ER positive PR negative. With Ki-67 of 9%. The pathological staging was pT2 NX.  She is also status post left mastectomy done in 1988.  Current therapy: Arimidex 1 mg daily started in July 2015.  Interim History:  [name] presents today for a follow-up visit with her daughter. Since the last visit, she underwent amputation above the knee bilaterally in September 2017 because of recurrent cellulitis and gangrene. She recovered well since that operation and I have not had any hospitalization or illnesses.  She remains on dialysis dependent and continues to tolerate that without any issues. She remains independent and continues to manage without any issues.  She continues to take Arimidex without any complications. She denied arthralgias, myalgias or GI toxicities. Her appetite remains reasonable although she lost a few pounds.  She does not report any fevers or chills or sweats. Has not reported any weight loss or appetite changes. She does not report any chest pain shortness of breath cough or hemoptysis. Does not report any palpitation orthopnea or PND. Does not report any nausea or vomiting or abdominal pain. Does not report any frequency urgency or hesitancy. Does not report any worsening skeletal complaints.  Does not report any lymphadenopathy or petechiae. Has not reported any other neurological symptoms of headaches or blurry vision. Rest of the review of system is unremarkable.  Medications: I have reviewed the patient's current medications.  Current Outpatient Prescriptions  Medication Sig Dispense Refill  . acetaminophen (TYLENOL) 325 MG tablet Take 2 tablets (650 mg total) by mouth every 6 (six) hours as needed for mild pain (or Fever >/= 101).    . Amino Acids-Protein Hydrolys (FEEDING SUPPLEMENT, PRO-STAT SUGAR FREE 64,) LIQD Take 30 mLs by mouth 2 (two) times daily. 900 mL 0  . anastrozole (ARIMIDEX) 1 MG tablet Take 1 tablet (1 mg total) by mouth daily. (Patient taking differently: Take 1 mg by mouth daily after supper. ) 30 tablet 6  . B Complex-C-Folic Acid (DIALYVITE TABLET) TABS Take 1 tablet by mouth daily after supper.  1  . cinacalcet (SENSIPAR) 60 MG tablet Take 60 mg by mouth daily after supper.    Marland Kitchen lanthanum (FOSRENOL) 1000 MG chewable tablet Chew 1,000 mg by mouth daily after supper.     . methocarbamol (ROBAXIN) 500 MG tablet Take 1 tablet (500 mg total) by mouth every 6 (six) hours as needed for muscle spasms. 15 tablet 0  . metoprolol succinate (TOPROL XL) 25 MG 24 hr tablet Take 1 tablet (25 mg total) by mouth daily.    . Nutritional Supplements (FEEDING SUPPLEMENT, NEPRO CARB STEADY,) LIQD Take 237 mLs by mouth 2 (two) times daily between meals.  0  . oxyCODONE-acetaminophen (PERCOCET/ROXICET) 5-325 MG tablet Take 1 tablet by mouth every 4 (four) hours as needed for severe pain. 15 tablet 0  . traMADol (ULTRAM)  50 MG tablet Take 1 tablet (50 mg total) by mouth every 12 (twelve) hours as needed for moderate pain. 15 tablet 0   No current facility-administered medications for this visit.      Allergies:  Allergies  Allergen Reactions  . Aspirin Other (See Comments)    NO BLOOD THINNERS OF ANY KIND-bleeding events    Past Medical History, Surgical history, Social  history, and Family History were reviewed and updated.  Physical Exam: Blood pressure 128/69, pulse 76, temperature 98.2 F (36.8 C), temperature source Oral, resp. rate 17, height 5' 2" (1.575 m), weight 117 lb 8 oz (53.3 kg), SpO2 96 %. ECOG: 2 General appearance: Chronically ill-appearing woman appeared without distress. Head: Normocephalic, without obvious abnormality no oral thrush or ulcers. Neck: no adenopathy Lymph nodes: Cervical, supraclavicular, and axillary nodes normal. Heart:regular rate and rhythm, S1, S2 normal, no murmur, click, rub or gallop Lung:chest clear, no wheezing, rales, normal symmetric air entry Chest wall examination revealed no tenderness or masses. Abdomin: soft, non-tender, without masses or organomegaly no shifting dullness or ascites. EXT: Bilateral amputation noted.   Lab Results: Lab Results  Component Value Date   WBC 11.6 (H) 11/24/2015   HGB 9.2 (L) 11/28/2015   HCT 27.0 (L) 11/28/2015   MCV 94.2 11/24/2015   PLT 169 11/24/2015     Chemistry      Component Value Date/Time   NA 136 11/28/2015 2108   NA 139 09/04/2013 1025   K 2.8 (L) 11/28/2015 2108   K 4.0 09/04/2013 1025   CL 98 (L) 11/28/2015 2108   CO2 23 11/24/2015 0433   CO2 31 (H) 09/04/2013 1025   BUN 5 (L) 11/28/2015 2108   BUN 49.9 (H) 09/04/2013 1025   CREATININE 1.90 (H) 11/28/2015 2108   CREATININE 6.2 (HH) 09/04/2013 1025      Component Value Date/Time   CALCIUM 9.5 11/24/2015 0433   CALCIUM 10.4 09/04/2013 1025   ALKPHOS 76 11/15/2015 1017   ALKPHOS 152 (H) 09/04/2013 1025   AST 25 11/15/2015 1017   AST 43 (H) 09/04/2013 1025   ALT 14 11/15/2015 1017   ALT 21 09/04/2013 1025   BILITOT 0.6 11/15/2015 1017   BILITOT 0.54 09/04/2013 1025       Impression and Plan:   83 year old woman with the following issues:  1. An invasive ductal carcinoma of the right breast after presenting with 3.5 cm mass. She is status post right mastectomy with the pathological  staging of T2 Nx completed in June 2015. Her clinical staging is T2 N0 with clinically negative axilla. Her tumor is ER positive HER-2 negative. With low grade features. She is status post left mastectomy for a breast cancer in 1988 and that time she was treated with tamoxifen.  She is currently on Arimidex and tolerated it well since July 2015. She reports no complications related to this medication and the plan is to continue for a total of 5 years.  2. Renal insufficiency: Is currently hemodialysis-dependent. Recent complications related to that.  3. Anemia: Related to her renal insufficiency. Hemoglobin appeared stable.  4. Follow-up: Will be in 6 months.   [facility], MD 2/13/201811:27 AM

## 2016-04-20 NOTE — Telephone Encounter (Signed)
Appointments scheduled per 2/13 LOS. Patient given AVS report and calendars with future scheduled appointments. °

## 2016-04-20 NOTE — Addendum Note (Signed)
Addended by: Randolm Idol on: 04/20/2016 12:02 PM   Modules accepted: Orders

## 2016-04-21 DIAGNOSIS — E119 Type 2 diabetes mellitus without complications: Secondary | ICD-10-CM | POA: Diagnosis not present

## 2016-04-21 DIAGNOSIS — D631 Anemia in chronic kidney disease: Secondary | ICD-10-CM | POA: Diagnosis not present

## 2016-04-21 DIAGNOSIS — N2581 Secondary hyperparathyroidism of renal origin: Secondary | ICD-10-CM | POA: Diagnosis not present

## 2016-04-21 DIAGNOSIS — N186 End stage renal disease: Secondary | ICD-10-CM | POA: Diagnosis not present

## 2016-04-21 DIAGNOSIS — D509 Iron deficiency anemia, unspecified: Secondary | ICD-10-CM | POA: Diagnosis not present

## 2016-04-22 ENCOUNTER — Ambulatory Visit (INDEPENDENT_AMBULATORY_CARE_PROVIDER_SITE_OTHER): Payer: Medicare Other | Admitting: Family

## 2016-04-22 DIAGNOSIS — E1122 Type 2 diabetes mellitus with diabetic chronic kidney disease: Secondary | ICD-10-CM | POA: Diagnosis not present

## 2016-04-22 DIAGNOSIS — Z89611 Acquired absence of right leg above knee: Secondary | ICD-10-CM | POA: Diagnosis not present

## 2016-04-22 DIAGNOSIS — L89152 Pressure ulcer of sacral region, stage 2: Secondary | ICD-10-CM | POA: Diagnosis not present

## 2016-04-22 DIAGNOSIS — Z89612 Acquired absence of left leg above knee: Secondary | ICD-10-CM

## 2016-04-22 DIAGNOSIS — N186 End stage renal disease: Secondary | ICD-10-CM | POA: Diagnosis not present

## 2016-04-22 DIAGNOSIS — M6281 Muscle weakness (generalized): Secondary | ICD-10-CM | POA: Diagnosis not present

## 2016-04-22 DIAGNOSIS — I120 Hypertensive chronic kidney disease with stage 5 chronic kidney disease or end stage renal disease: Secondary | ICD-10-CM | POA: Diagnosis not present

## 2016-04-22 DIAGNOSIS — L89322 Pressure ulcer of left buttock, stage 2: Secondary | ICD-10-CM | POA: Diagnosis not present
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
## 2016-04-23 DIAGNOSIS — D631 Anemia in chronic kidney disease: Secondary | ICD-10-CM | POA: Diagnosis not present

## 2016-04-23 DIAGNOSIS — N186 End stage renal disease: Secondary | ICD-10-CM | POA: Diagnosis not present

## 2016-04-23 DIAGNOSIS — N2581 Secondary hyperparathyroidism of renal origin: Secondary | ICD-10-CM | POA: Diagnosis not present

## 2016-04-23 DIAGNOSIS — E119 Type 2 diabetes mellitus without complications: Secondary | ICD-10-CM | POA: Diagnosis not present

## 2016-04-23 DIAGNOSIS — D509 Iron deficiency anemia, unspecified: Secondary | ICD-10-CM | POA: Diagnosis not present

## 2016-04-24 DIAGNOSIS — L89152 Pressure ulcer of sacral region, stage 2: Secondary | ICD-10-CM | POA: Diagnosis not present

## 2016-04-24 DIAGNOSIS — M6281 Muscle weakness (generalized): Secondary | ICD-10-CM | POA: Diagnosis not present

## 2016-04-24 DIAGNOSIS — I120 Hypertensive chronic kidney disease with stage 5 chronic kidney disease or end stage renal disease: Secondary | ICD-10-CM | POA: Diagnosis not present

## 2016-04-24 DIAGNOSIS — E1122 Type 2 diabetes mellitus with diabetic chronic kidney disease: Secondary | ICD-10-CM | POA: Diagnosis not present

## 2016-04-24 DIAGNOSIS — L89322 Pressure ulcer of left buttock, stage 2: Secondary | ICD-10-CM | POA: Diagnosis not present

## 2016-04-24 DIAGNOSIS — N186 End stage renal disease: Secondary | ICD-10-CM | POA: Diagnosis not present

## 2016-04-26 DIAGNOSIS — D631 Anemia in chronic kidney disease: Secondary | ICD-10-CM | POA: Diagnosis not present

## 2016-04-26 DIAGNOSIS — E119 Type 2 diabetes mellitus without complications: Secondary | ICD-10-CM | POA: Diagnosis not present

## 2016-04-26 DIAGNOSIS — N2581 Secondary hyperparathyroidism of renal origin: Secondary | ICD-10-CM | POA: Diagnosis not present

## 2016-04-26 DIAGNOSIS — D509 Iron deficiency anemia, unspecified: Secondary | ICD-10-CM | POA: Diagnosis not present

## 2016-04-26 DIAGNOSIS — N186 End stage renal disease: Secondary | ICD-10-CM | POA: Diagnosis not present

## 2016-04-27 DIAGNOSIS — N186 End stage renal disease: Secondary | ICD-10-CM | POA: Diagnosis not present

## 2016-04-27 DIAGNOSIS — I120 Hypertensive chronic kidney disease with stage 5 chronic kidney disease or end stage renal disease: Secondary | ICD-10-CM | POA: Diagnosis not present

## 2016-04-27 DIAGNOSIS — L89322 Pressure ulcer of left buttock, stage 2: Secondary | ICD-10-CM | POA: Diagnosis not present

## 2016-04-27 DIAGNOSIS — E1122 Type 2 diabetes mellitus with diabetic chronic kidney disease: Secondary | ICD-10-CM | POA: Diagnosis not present

## 2016-04-27 DIAGNOSIS — L89152 Pressure ulcer of sacral region, stage 2: Secondary | ICD-10-CM | POA: Diagnosis not present

## 2016-04-27 DIAGNOSIS — M6281 Muscle weakness (generalized): Secondary | ICD-10-CM | POA: Diagnosis not present

## 2016-04-28 DIAGNOSIS — N2581 Secondary hyperparathyroidism of renal origin: Secondary | ICD-10-CM | POA: Diagnosis not present

## 2016-04-28 DIAGNOSIS — D509 Iron deficiency anemia, unspecified: Secondary | ICD-10-CM | POA: Diagnosis not present

## 2016-04-28 DIAGNOSIS — N186 End stage renal disease: Secondary | ICD-10-CM | POA: Diagnosis not present

## 2016-04-28 DIAGNOSIS — E119 Type 2 diabetes mellitus without complications: Secondary | ICD-10-CM | POA: Diagnosis not present

## 2016-04-28 DIAGNOSIS — D631 Anemia in chronic kidney disease: Secondary | ICD-10-CM | POA: Diagnosis not present

## 2016-04-29 DIAGNOSIS — R531 Weakness: Secondary | ICD-10-CM | POA: Diagnosis not present

## 2016-04-30 DIAGNOSIS — D631 Anemia in chronic kidney disease: Secondary | ICD-10-CM | POA: Diagnosis not present

## 2016-04-30 DIAGNOSIS — N2581 Secondary hyperparathyroidism of renal origin: Secondary | ICD-10-CM | POA: Diagnosis not present

## 2016-04-30 DIAGNOSIS — D509 Iron deficiency anemia, unspecified: Secondary | ICD-10-CM | POA: Diagnosis not present

## 2016-04-30 DIAGNOSIS — E119 Type 2 diabetes mellitus without complications: Secondary | ICD-10-CM | POA: Diagnosis not present

## 2016-04-30 DIAGNOSIS — N186 End stage renal disease: Secondary | ICD-10-CM | POA: Diagnosis not present

## 2016-05-03 DIAGNOSIS — D509 Iron deficiency anemia, unspecified: Secondary | ICD-10-CM | POA: Diagnosis not present

## 2016-05-03 DIAGNOSIS — N186 End stage renal disease: Secondary | ICD-10-CM | POA: Diagnosis not present

## 2016-05-03 DIAGNOSIS — E119 Type 2 diabetes mellitus without complications: Secondary | ICD-10-CM | POA: Diagnosis not present

## 2016-05-03 DIAGNOSIS — D631 Anemia in chronic kidney disease: Secondary | ICD-10-CM | POA: Diagnosis not present

## 2016-05-03 DIAGNOSIS — N2581 Secondary hyperparathyroidism of renal origin: Secondary | ICD-10-CM | POA: Diagnosis not present

## 2016-05-05 DIAGNOSIS — N2581 Secondary hyperparathyroidism of renal origin: Secondary | ICD-10-CM | POA: Diagnosis not present

## 2016-05-05 DIAGNOSIS — D509 Iron deficiency anemia, unspecified: Secondary | ICD-10-CM | POA: Diagnosis not present

## 2016-05-05 DIAGNOSIS — E1129 Type 2 diabetes mellitus with other diabetic kidney complication: Secondary | ICD-10-CM | POA: Diagnosis not present

## 2016-05-05 DIAGNOSIS — Z992 Dependence on renal dialysis: Secondary | ICD-10-CM | POA: Diagnosis not present

## 2016-05-05 DIAGNOSIS — E119 Type 2 diabetes mellitus without complications: Secondary | ICD-10-CM | POA: Diagnosis not present

## 2016-05-05 DIAGNOSIS — N186 End stage renal disease: Secondary | ICD-10-CM | POA: Diagnosis not present

## 2016-05-05 DIAGNOSIS — D631 Anemia in chronic kidney disease: Secondary | ICD-10-CM | POA: Diagnosis not present

## 2016-05-07 DIAGNOSIS — N186 End stage renal disease: Secondary | ICD-10-CM | POA: Diagnosis not present

## 2016-05-07 DIAGNOSIS — N2581 Secondary hyperparathyroidism of renal origin: Secondary | ICD-10-CM | POA: Diagnosis not present

## 2016-05-07 DIAGNOSIS — D631 Anemia in chronic kidney disease: Secondary | ICD-10-CM | POA: Diagnosis not present

## 2016-05-07 DIAGNOSIS — D509 Iron deficiency anemia, unspecified: Secondary | ICD-10-CM | POA: Diagnosis not present

## 2016-05-07 DIAGNOSIS — E119 Type 2 diabetes mellitus without complications: Secondary | ICD-10-CM | POA: Diagnosis not present

## 2016-05-10 DIAGNOSIS — E119 Type 2 diabetes mellitus without complications: Secondary | ICD-10-CM | POA: Diagnosis not present

## 2016-05-10 DIAGNOSIS — N186 End stage renal disease: Secondary | ICD-10-CM | POA: Diagnosis not present

## 2016-05-10 DIAGNOSIS — D509 Iron deficiency anemia, unspecified: Secondary | ICD-10-CM | POA: Diagnosis not present

## 2016-05-10 DIAGNOSIS — D631 Anemia in chronic kidney disease: Secondary | ICD-10-CM | POA: Diagnosis not present

## 2016-05-10 DIAGNOSIS — N2581 Secondary hyperparathyroidism of renal origin: Secondary | ICD-10-CM | POA: Diagnosis not present

## 2016-05-11 DIAGNOSIS — I871 Compression of vein: Secondary | ICD-10-CM | POA: Diagnosis not present

## 2016-05-11 DIAGNOSIS — N186 End stage renal disease: Secondary | ICD-10-CM | POA: Diagnosis not present

## 2016-05-11 DIAGNOSIS — Z992 Dependence on renal dialysis: Secondary | ICD-10-CM | POA: Diagnosis not present

## 2016-05-12 DIAGNOSIS — D509 Iron deficiency anemia, unspecified: Secondary | ICD-10-CM | POA: Diagnosis not present

## 2016-05-12 DIAGNOSIS — E119 Type 2 diabetes mellitus without complications: Secondary | ICD-10-CM | POA: Diagnosis not present

## 2016-05-12 DIAGNOSIS — D631 Anemia in chronic kidney disease: Secondary | ICD-10-CM | POA: Diagnosis not present

## 2016-05-12 DIAGNOSIS — N2581 Secondary hyperparathyroidism of renal origin: Secondary | ICD-10-CM | POA: Diagnosis not present

## 2016-05-12 DIAGNOSIS — N186 End stage renal disease: Secondary | ICD-10-CM | POA: Diagnosis not present

## 2016-05-14 DIAGNOSIS — N186 End stage renal disease: Secondary | ICD-10-CM | POA: Diagnosis not present

## 2016-05-14 DIAGNOSIS — N2581 Secondary hyperparathyroidism of renal origin: Secondary | ICD-10-CM | POA: Diagnosis not present

## 2016-05-14 DIAGNOSIS — D631 Anemia in chronic kidney disease: Secondary | ICD-10-CM | POA: Diagnosis not present

## 2016-05-14 DIAGNOSIS — D509 Iron deficiency anemia, unspecified: Secondary | ICD-10-CM | POA: Diagnosis not present

## 2016-05-14 DIAGNOSIS — E119 Type 2 diabetes mellitus without complications: Secondary | ICD-10-CM | POA: Diagnosis not present

## 2016-05-17 DIAGNOSIS — D509 Iron deficiency anemia, unspecified: Secondary | ICD-10-CM | POA: Diagnosis not present

## 2016-05-17 DIAGNOSIS — E119 Type 2 diabetes mellitus without complications: Secondary | ICD-10-CM | POA: Diagnosis not present

## 2016-05-17 DIAGNOSIS — D631 Anemia in chronic kidney disease: Secondary | ICD-10-CM | POA: Diagnosis not present

## 2016-05-17 DIAGNOSIS — N186 End stage renal disease: Secondary | ICD-10-CM | POA: Diagnosis not present

## 2016-05-17 DIAGNOSIS — N2581 Secondary hyperparathyroidism of renal origin: Secondary | ICD-10-CM | POA: Diagnosis not present

## 2016-05-19 DIAGNOSIS — N2581 Secondary hyperparathyroidism of renal origin: Secondary | ICD-10-CM | POA: Diagnosis not present

## 2016-05-19 DIAGNOSIS — N186 End stage renal disease: Secondary | ICD-10-CM | POA: Diagnosis not present

## 2016-05-19 DIAGNOSIS — E119 Type 2 diabetes mellitus without complications: Secondary | ICD-10-CM | POA: Diagnosis not present

## 2016-05-19 DIAGNOSIS — D631 Anemia in chronic kidney disease: Secondary | ICD-10-CM | POA: Diagnosis not present

## 2016-05-19 DIAGNOSIS — D509 Iron deficiency anemia, unspecified: Secondary | ICD-10-CM | POA: Diagnosis not present

## 2016-05-21 DIAGNOSIS — D509 Iron deficiency anemia, unspecified: Secondary | ICD-10-CM | POA: Diagnosis not present

## 2016-05-21 DIAGNOSIS — D631 Anemia in chronic kidney disease: Secondary | ICD-10-CM | POA: Diagnosis not present

## 2016-05-21 DIAGNOSIS — N186 End stage renal disease: Secondary | ICD-10-CM | POA: Diagnosis not present

## 2016-05-21 DIAGNOSIS — E119 Type 2 diabetes mellitus without complications: Secondary | ICD-10-CM | POA: Diagnosis not present

## 2016-05-21 DIAGNOSIS — N2581 Secondary hyperparathyroidism of renal origin: Secondary | ICD-10-CM | POA: Diagnosis not present

## 2016-05-24 DIAGNOSIS — N186 End stage renal disease: Secondary | ICD-10-CM | POA: Diagnosis not present

## 2016-05-24 DIAGNOSIS — N2581 Secondary hyperparathyroidism of renal origin: Secondary | ICD-10-CM | POA: Diagnosis not present

## 2016-05-24 DIAGNOSIS — E119 Type 2 diabetes mellitus without complications: Secondary | ICD-10-CM | POA: Diagnosis not present

## 2016-05-24 DIAGNOSIS — D631 Anemia in chronic kidney disease: Secondary | ICD-10-CM | POA: Diagnosis not present

## 2016-05-24 DIAGNOSIS — D509 Iron deficiency anemia, unspecified: Secondary | ICD-10-CM | POA: Diagnosis not present

## 2016-05-26 DIAGNOSIS — D509 Iron deficiency anemia, unspecified: Secondary | ICD-10-CM | POA: Diagnosis not present

## 2016-05-26 DIAGNOSIS — N2581 Secondary hyperparathyroidism of renal origin: Secondary | ICD-10-CM | POA: Diagnosis not present

## 2016-05-26 DIAGNOSIS — E119 Type 2 diabetes mellitus without complications: Secondary | ICD-10-CM | POA: Diagnosis not present

## 2016-05-26 DIAGNOSIS — N186 End stage renal disease: Secondary | ICD-10-CM | POA: Diagnosis not present

## 2016-05-26 DIAGNOSIS — D631 Anemia in chronic kidney disease: Secondary | ICD-10-CM | POA: Diagnosis not present

## 2016-05-28 DIAGNOSIS — N186 End stage renal disease: Secondary | ICD-10-CM | POA: Diagnosis not present

## 2016-05-28 DIAGNOSIS — E119 Type 2 diabetes mellitus without complications: Secondary | ICD-10-CM | POA: Diagnosis not present

## 2016-05-28 DIAGNOSIS — D631 Anemia in chronic kidney disease: Secondary | ICD-10-CM | POA: Diagnosis not present

## 2016-05-28 DIAGNOSIS — D509 Iron deficiency anemia, unspecified: Secondary | ICD-10-CM | POA: Diagnosis not present

## 2016-05-28 DIAGNOSIS — N2581 Secondary hyperparathyroidism of renal origin: Secondary | ICD-10-CM | POA: Diagnosis not present

## 2016-05-31 DIAGNOSIS — D509 Iron deficiency anemia, unspecified: Secondary | ICD-10-CM | POA: Diagnosis not present

## 2016-05-31 DIAGNOSIS — D631 Anemia in chronic kidney disease: Secondary | ICD-10-CM | POA: Diagnosis not present

## 2016-05-31 DIAGNOSIS — N186 End stage renal disease: Secondary | ICD-10-CM | POA: Diagnosis not present

## 2016-05-31 DIAGNOSIS — E119 Type 2 diabetes mellitus without complications: Secondary | ICD-10-CM | POA: Diagnosis not present

## 2016-05-31 DIAGNOSIS — N2581 Secondary hyperparathyroidism of renal origin: Secondary | ICD-10-CM | POA: Diagnosis not present

## 2016-06-02 DIAGNOSIS — D631 Anemia in chronic kidney disease: Secondary | ICD-10-CM | POA: Diagnosis not present

## 2016-06-02 DIAGNOSIS — E119 Type 2 diabetes mellitus without complications: Secondary | ICD-10-CM | POA: Diagnosis not present

## 2016-06-02 DIAGNOSIS — N2581 Secondary hyperparathyroidism of renal origin: Secondary | ICD-10-CM | POA: Diagnosis not present

## 2016-06-02 DIAGNOSIS — N186 End stage renal disease: Secondary | ICD-10-CM | POA: Diagnosis not present

## 2016-06-02 DIAGNOSIS — D509 Iron deficiency anemia, unspecified: Secondary | ICD-10-CM | POA: Diagnosis not present

## 2016-06-04 DIAGNOSIS — N2581 Secondary hyperparathyroidism of renal origin: Secondary | ICD-10-CM | POA: Diagnosis not present

## 2016-06-04 DIAGNOSIS — D631 Anemia in chronic kidney disease: Secondary | ICD-10-CM | POA: Diagnosis not present

## 2016-06-04 DIAGNOSIS — E119 Type 2 diabetes mellitus without complications: Secondary | ICD-10-CM | POA: Diagnosis not present

## 2016-06-04 DIAGNOSIS — D509 Iron deficiency anemia, unspecified: Secondary | ICD-10-CM | POA: Diagnosis not present

## 2016-06-04 DIAGNOSIS — N186 End stage renal disease: Secondary | ICD-10-CM | POA: Diagnosis not present

## 2016-06-05 DIAGNOSIS — Z992 Dependence on renal dialysis: Secondary | ICD-10-CM | POA: Diagnosis not present

## 2016-06-05 DIAGNOSIS — N186 End stage renal disease: Secondary | ICD-10-CM | POA: Diagnosis not present

## 2016-06-05 DIAGNOSIS — E1129 Type 2 diabetes mellitus with other diabetic kidney complication: Secondary | ICD-10-CM | POA: Diagnosis not present

## 2016-06-07 DIAGNOSIS — D631 Anemia in chronic kidney disease: Secondary | ICD-10-CM | POA: Diagnosis not present

## 2016-06-07 DIAGNOSIS — N2581 Secondary hyperparathyroidism of renal origin: Secondary | ICD-10-CM | POA: Diagnosis not present

## 2016-06-07 DIAGNOSIS — E119 Type 2 diabetes mellitus without complications: Secondary | ICD-10-CM | POA: Diagnosis not present

## 2016-06-07 DIAGNOSIS — D509 Iron deficiency anemia, unspecified: Secondary | ICD-10-CM | POA: Diagnosis not present

## 2016-06-07 DIAGNOSIS — N186 End stage renal disease: Secondary | ICD-10-CM | POA: Diagnosis not present

## 2016-06-09 DIAGNOSIS — N186 End stage renal disease: Secondary | ICD-10-CM | POA: Diagnosis not present

## 2016-06-09 DIAGNOSIS — N2581 Secondary hyperparathyroidism of renal origin: Secondary | ICD-10-CM | POA: Diagnosis not present

## 2016-06-09 DIAGNOSIS — D631 Anemia in chronic kidney disease: Secondary | ICD-10-CM | POA: Diagnosis not present

## 2016-06-09 DIAGNOSIS — D509 Iron deficiency anemia, unspecified: Secondary | ICD-10-CM | POA: Diagnosis not present

## 2016-06-09 DIAGNOSIS — E119 Type 2 diabetes mellitus without complications: Secondary | ICD-10-CM | POA: Diagnosis not present

## 2016-06-11 DIAGNOSIS — D631 Anemia in chronic kidney disease: Secondary | ICD-10-CM | POA: Diagnosis not present

## 2016-06-11 DIAGNOSIS — N2581 Secondary hyperparathyroidism of renal origin: Secondary | ICD-10-CM | POA: Diagnosis not present

## 2016-06-11 DIAGNOSIS — N186 End stage renal disease: Secondary | ICD-10-CM | POA: Diagnosis not present

## 2016-06-11 DIAGNOSIS — D509 Iron deficiency anemia, unspecified: Secondary | ICD-10-CM | POA: Diagnosis not present

## 2016-06-11 DIAGNOSIS — E119 Type 2 diabetes mellitus without complications: Secondary | ICD-10-CM | POA: Diagnosis not present

## 2016-06-14 DIAGNOSIS — D509 Iron deficiency anemia, unspecified: Secondary | ICD-10-CM | POA: Diagnosis not present

## 2016-06-14 DIAGNOSIS — N2581 Secondary hyperparathyroidism of renal origin: Secondary | ICD-10-CM | POA: Diagnosis not present

## 2016-06-14 DIAGNOSIS — N186 End stage renal disease: Secondary | ICD-10-CM | POA: Diagnosis not present

## 2016-06-14 DIAGNOSIS — E119 Type 2 diabetes mellitus without complications: Secondary | ICD-10-CM | POA: Diagnosis not present

## 2016-06-14 DIAGNOSIS — D631 Anemia in chronic kidney disease: Secondary | ICD-10-CM | POA: Diagnosis not present

## 2016-06-16 DIAGNOSIS — E119 Type 2 diabetes mellitus without complications: Secondary | ICD-10-CM | POA: Diagnosis not present

## 2016-06-16 DIAGNOSIS — D509 Iron deficiency anemia, unspecified: Secondary | ICD-10-CM | POA: Diagnosis not present

## 2016-06-16 DIAGNOSIS — N2581 Secondary hyperparathyroidism of renal origin: Secondary | ICD-10-CM | POA: Diagnosis not present

## 2016-06-16 DIAGNOSIS — D631 Anemia in chronic kidney disease: Secondary | ICD-10-CM | POA: Diagnosis not present

## 2016-06-16 DIAGNOSIS — N186 End stage renal disease: Secondary | ICD-10-CM | POA: Diagnosis not present

## 2016-06-18 DIAGNOSIS — N2581 Secondary hyperparathyroidism of renal origin: Secondary | ICD-10-CM | POA: Diagnosis not present

## 2016-06-18 DIAGNOSIS — D631 Anemia in chronic kidney disease: Secondary | ICD-10-CM | POA: Diagnosis not present

## 2016-06-18 DIAGNOSIS — E119 Type 2 diabetes mellitus without complications: Secondary | ICD-10-CM | POA: Diagnosis not present

## 2016-06-18 DIAGNOSIS — N186 End stage renal disease: Secondary | ICD-10-CM | POA: Diagnosis not present

## 2016-06-18 DIAGNOSIS — D509 Iron deficiency anemia, unspecified: Secondary | ICD-10-CM | POA: Diagnosis not present

## 2016-06-21 DIAGNOSIS — E119 Type 2 diabetes mellitus without complications: Secondary | ICD-10-CM | POA: Diagnosis not present

## 2016-06-21 DIAGNOSIS — N186 End stage renal disease: Secondary | ICD-10-CM | POA: Diagnosis not present

## 2016-06-21 DIAGNOSIS — N2581 Secondary hyperparathyroidism of renal origin: Secondary | ICD-10-CM | POA: Diagnosis not present

## 2016-06-21 DIAGNOSIS — D509 Iron deficiency anemia, unspecified: Secondary | ICD-10-CM | POA: Diagnosis not present

## 2016-06-21 DIAGNOSIS — D631 Anemia in chronic kidney disease: Secondary | ICD-10-CM | POA: Diagnosis not present

## 2016-06-23 DIAGNOSIS — D631 Anemia in chronic kidney disease: Secondary | ICD-10-CM | POA: Diagnosis not present

## 2016-06-23 DIAGNOSIS — N186 End stage renal disease: Secondary | ICD-10-CM | POA: Diagnosis not present

## 2016-06-23 DIAGNOSIS — E119 Type 2 diabetes mellitus without complications: Secondary | ICD-10-CM | POA: Diagnosis not present

## 2016-06-23 DIAGNOSIS — D509 Iron deficiency anemia, unspecified: Secondary | ICD-10-CM | POA: Diagnosis not present

## 2016-06-23 DIAGNOSIS — N2581 Secondary hyperparathyroidism of renal origin: Secondary | ICD-10-CM | POA: Diagnosis not present

## 2016-06-25 DIAGNOSIS — N186 End stage renal disease: Secondary | ICD-10-CM | POA: Diagnosis not present

## 2016-06-25 DIAGNOSIS — N2581 Secondary hyperparathyroidism of renal origin: Secondary | ICD-10-CM | POA: Diagnosis not present

## 2016-06-25 DIAGNOSIS — E119 Type 2 diabetes mellitus without complications: Secondary | ICD-10-CM | POA: Diagnosis not present

## 2016-06-25 DIAGNOSIS — D631 Anemia in chronic kidney disease: Secondary | ICD-10-CM | POA: Diagnosis not present

## 2016-06-25 DIAGNOSIS — D509 Iron deficiency anemia, unspecified: Secondary | ICD-10-CM | POA: Diagnosis not present

## 2016-06-28 DIAGNOSIS — E119 Type 2 diabetes mellitus without complications: Secondary | ICD-10-CM | POA: Diagnosis not present

## 2016-06-28 DIAGNOSIS — D631 Anemia in chronic kidney disease: Secondary | ICD-10-CM | POA: Diagnosis not present

## 2016-06-28 DIAGNOSIS — N2581 Secondary hyperparathyroidism of renal origin: Secondary | ICD-10-CM | POA: Diagnosis not present

## 2016-06-28 DIAGNOSIS — N186 End stage renal disease: Secondary | ICD-10-CM | POA: Diagnosis not present

## 2016-06-28 DIAGNOSIS — D509 Iron deficiency anemia, unspecified: Secondary | ICD-10-CM | POA: Diagnosis not present

## 2016-06-30 DIAGNOSIS — N2581 Secondary hyperparathyroidism of renal origin: Secondary | ICD-10-CM | POA: Diagnosis not present

## 2016-06-30 DIAGNOSIS — E119 Type 2 diabetes mellitus without complications: Secondary | ICD-10-CM | POA: Diagnosis not present

## 2016-06-30 DIAGNOSIS — D631 Anemia in chronic kidney disease: Secondary | ICD-10-CM | POA: Diagnosis not present

## 2016-06-30 DIAGNOSIS — N186 End stage renal disease: Secondary | ICD-10-CM | POA: Diagnosis not present

## 2016-06-30 DIAGNOSIS — D509 Iron deficiency anemia, unspecified: Secondary | ICD-10-CM | POA: Diagnosis not present

## 2016-06-30 DIAGNOSIS — E1129 Type 2 diabetes mellitus with other diabetic kidney complication: Secondary | ICD-10-CM | POA: Diagnosis not present

## 2016-07-02 DIAGNOSIS — D509 Iron deficiency anemia, unspecified: Secondary | ICD-10-CM | POA: Diagnosis not present

## 2016-07-02 DIAGNOSIS — N186 End stage renal disease: Secondary | ICD-10-CM | POA: Diagnosis not present

## 2016-07-02 DIAGNOSIS — N2581 Secondary hyperparathyroidism of renal origin: Secondary | ICD-10-CM | POA: Diagnosis not present

## 2016-07-02 DIAGNOSIS — E119 Type 2 diabetes mellitus without complications: Secondary | ICD-10-CM | POA: Diagnosis not present

## 2016-07-02 DIAGNOSIS — D631 Anemia in chronic kidney disease: Secondary | ICD-10-CM | POA: Diagnosis not present

## 2016-07-03 ENCOUNTER — Other Ambulatory Visit: Payer: Self-pay | Admitting: Oncology

## 2016-07-05 DIAGNOSIS — D509 Iron deficiency anemia, unspecified: Secondary | ICD-10-CM | POA: Diagnosis not present

## 2016-07-05 DIAGNOSIS — Z992 Dependence on renal dialysis: Secondary | ICD-10-CM | POA: Diagnosis not present

## 2016-07-05 DIAGNOSIS — E1129 Type 2 diabetes mellitus with other diabetic kidney complication: Secondary | ICD-10-CM | POA: Diagnosis not present

## 2016-07-05 DIAGNOSIS — N186 End stage renal disease: Secondary | ICD-10-CM | POA: Diagnosis not present

## 2016-07-05 DIAGNOSIS — E119 Type 2 diabetes mellitus without complications: Secondary | ICD-10-CM | POA: Diagnosis not present

## 2016-07-05 DIAGNOSIS — D631 Anemia in chronic kidney disease: Secondary | ICD-10-CM | POA: Diagnosis not present

## 2016-07-05 DIAGNOSIS — N2581 Secondary hyperparathyroidism of renal origin: Secondary | ICD-10-CM | POA: Diagnosis not present

## 2016-07-07 DIAGNOSIS — E119 Type 2 diabetes mellitus without complications: Secondary | ICD-10-CM | POA: Diagnosis not present

## 2016-07-07 DIAGNOSIS — N186 End stage renal disease: Secondary | ICD-10-CM | POA: Diagnosis not present

## 2016-07-07 DIAGNOSIS — D631 Anemia in chronic kidney disease: Secondary | ICD-10-CM | POA: Diagnosis not present

## 2016-07-07 DIAGNOSIS — D509 Iron deficiency anemia, unspecified: Secondary | ICD-10-CM | POA: Diagnosis not present

## 2016-07-07 DIAGNOSIS — N2581 Secondary hyperparathyroidism of renal origin: Secondary | ICD-10-CM | POA: Diagnosis not present

## 2016-07-09 DIAGNOSIS — N2581 Secondary hyperparathyroidism of renal origin: Secondary | ICD-10-CM | POA: Diagnosis not present

## 2016-07-09 DIAGNOSIS — E119 Type 2 diabetes mellitus without complications: Secondary | ICD-10-CM | POA: Diagnosis not present

## 2016-07-09 DIAGNOSIS — N186 End stage renal disease: Secondary | ICD-10-CM | POA: Diagnosis not present

## 2016-07-09 DIAGNOSIS — D631 Anemia in chronic kidney disease: Secondary | ICD-10-CM | POA: Diagnosis not present

## 2016-07-09 DIAGNOSIS — D509 Iron deficiency anemia, unspecified: Secondary | ICD-10-CM | POA: Diagnosis not present

## 2016-07-12 DIAGNOSIS — N2581 Secondary hyperparathyroidism of renal origin: Secondary | ICD-10-CM | POA: Diagnosis not present

## 2016-07-12 DIAGNOSIS — D631 Anemia in chronic kidney disease: Secondary | ICD-10-CM | POA: Diagnosis not present

## 2016-07-12 DIAGNOSIS — N186 End stage renal disease: Secondary | ICD-10-CM | POA: Diagnosis not present

## 2016-07-12 DIAGNOSIS — E119 Type 2 diabetes mellitus without complications: Secondary | ICD-10-CM | POA: Diagnosis not present

## 2016-07-12 DIAGNOSIS — D509 Iron deficiency anemia, unspecified: Secondary | ICD-10-CM | POA: Diagnosis not present

## 2016-07-14 DIAGNOSIS — D509 Iron deficiency anemia, unspecified: Secondary | ICD-10-CM | POA: Diagnosis not present

## 2016-07-14 DIAGNOSIS — N2581 Secondary hyperparathyroidism of renal origin: Secondary | ICD-10-CM | POA: Diagnosis not present

## 2016-07-14 DIAGNOSIS — N186 End stage renal disease: Secondary | ICD-10-CM | POA: Diagnosis not present

## 2016-07-14 DIAGNOSIS — D631 Anemia in chronic kidney disease: Secondary | ICD-10-CM | POA: Diagnosis not present

## 2016-07-14 DIAGNOSIS — E119 Type 2 diabetes mellitus without complications: Secondary | ICD-10-CM | POA: Diagnosis not present

## 2016-07-16 DIAGNOSIS — D631 Anemia in chronic kidney disease: Secondary | ICD-10-CM | POA: Diagnosis not present

## 2016-07-16 DIAGNOSIS — N2581 Secondary hyperparathyroidism of renal origin: Secondary | ICD-10-CM | POA: Diagnosis not present

## 2016-07-16 DIAGNOSIS — E119 Type 2 diabetes mellitus without complications: Secondary | ICD-10-CM | POA: Diagnosis not present

## 2016-07-16 DIAGNOSIS — N186 End stage renal disease: Secondary | ICD-10-CM | POA: Diagnosis not present

## 2016-07-16 DIAGNOSIS — D509 Iron deficiency anemia, unspecified: Secondary | ICD-10-CM | POA: Diagnosis not present

## 2016-07-19 DIAGNOSIS — N2581 Secondary hyperparathyroidism of renal origin: Secondary | ICD-10-CM | POA: Diagnosis not present

## 2016-07-19 DIAGNOSIS — N186 End stage renal disease: Secondary | ICD-10-CM | POA: Diagnosis not present

## 2016-07-19 DIAGNOSIS — D509 Iron deficiency anemia, unspecified: Secondary | ICD-10-CM | POA: Diagnosis not present

## 2016-07-19 DIAGNOSIS — E119 Type 2 diabetes mellitus without complications: Secondary | ICD-10-CM | POA: Diagnosis not present

## 2016-07-19 DIAGNOSIS — D631 Anemia in chronic kidney disease: Secondary | ICD-10-CM | POA: Diagnosis not present

## 2016-07-21 DIAGNOSIS — E119 Type 2 diabetes mellitus without complications: Secondary | ICD-10-CM | POA: Diagnosis not present

## 2016-07-21 DIAGNOSIS — N186 End stage renal disease: Secondary | ICD-10-CM | POA: Diagnosis not present

## 2016-07-21 DIAGNOSIS — N2581 Secondary hyperparathyroidism of renal origin: Secondary | ICD-10-CM | POA: Diagnosis not present

## 2016-07-21 DIAGNOSIS — D509 Iron deficiency anemia, unspecified: Secondary | ICD-10-CM | POA: Diagnosis not present

## 2016-07-21 DIAGNOSIS — D631 Anemia in chronic kidney disease: Secondary | ICD-10-CM | POA: Diagnosis not present

## 2016-07-23 DIAGNOSIS — N186 End stage renal disease: Secondary | ICD-10-CM | POA: Diagnosis not present

## 2016-07-23 DIAGNOSIS — E119 Type 2 diabetes mellitus without complications: Secondary | ICD-10-CM | POA: Diagnosis not present

## 2016-07-23 DIAGNOSIS — D631 Anemia in chronic kidney disease: Secondary | ICD-10-CM | POA: Diagnosis not present

## 2016-07-23 DIAGNOSIS — D509 Iron deficiency anemia, unspecified: Secondary | ICD-10-CM | POA: Diagnosis not present

## 2016-07-23 DIAGNOSIS — N2581 Secondary hyperparathyroidism of renal origin: Secondary | ICD-10-CM | POA: Diagnosis not present

## 2016-07-26 DIAGNOSIS — N186 End stage renal disease: Secondary | ICD-10-CM | POA: Diagnosis not present

## 2016-07-26 DIAGNOSIS — D631 Anemia in chronic kidney disease: Secondary | ICD-10-CM | POA: Diagnosis not present

## 2016-07-26 DIAGNOSIS — E119 Type 2 diabetes mellitus without complications: Secondary | ICD-10-CM | POA: Diagnosis not present

## 2016-07-26 DIAGNOSIS — D509 Iron deficiency anemia, unspecified: Secondary | ICD-10-CM | POA: Diagnosis not present

## 2016-07-26 DIAGNOSIS — N2581 Secondary hyperparathyroidism of renal origin: Secondary | ICD-10-CM | POA: Diagnosis not present

## 2016-07-28 DIAGNOSIS — D509 Iron deficiency anemia, unspecified: Secondary | ICD-10-CM | POA: Diagnosis not present

## 2016-07-28 DIAGNOSIS — N2581 Secondary hyperparathyroidism of renal origin: Secondary | ICD-10-CM | POA: Diagnosis not present

## 2016-07-28 DIAGNOSIS — E119 Type 2 diabetes mellitus without complications: Secondary | ICD-10-CM | POA: Diagnosis not present

## 2016-07-28 DIAGNOSIS — N186 End stage renal disease: Secondary | ICD-10-CM | POA: Diagnosis not present

## 2016-07-28 DIAGNOSIS — D631 Anemia in chronic kidney disease: Secondary | ICD-10-CM | POA: Diagnosis not present

## 2016-07-30 DIAGNOSIS — E119 Type 2 diabetes mellitus without complications: Secondary | ICD-10-CM | POA: Diagnosis not present

## 2016-07-30 DIAGNOSIS — D631 Anemia in chronic kidney disease: Secondary | ICD-10-CM | POA: Diagnosis not present

## 2016-07-30 DIAGNOSIS — N186 End stage renal disease: Secondary | ICD-10-CM | POA: Diagnosis not present

## 2016-07-30 DIAGNOSIS — D509 Iron deficiency anemia, unspecified: Secondary | ICD-10-CM | POA: Diagnosis not present

## 2016-07-30 DIAGNOSIS — N2581 Secondary hyperparathyroidism of renal origin: Secondary | ICD-10-CM | POA: Diagnosis not present

## 2016-08-02 DIAGNOSIS — E119 Type 2 diabetes mellitus without complications: Secondary | ICD-10-CM | POA: Diagnosis not present

## 2016-08-02 DIAGNOSIS — D509 Iron deficiency anemia, unspecified: Secondary | ICD-10-CM | POA: Diagnosis not present

## 2016-08-02 DIAGNOSIS — D631 Anemia in chronic kidney disease: Secondary | ICD-10-CM | POA: Diagnosis not present

## 2016-08-02 DIAGNOSIS — N186 End stage renal disease: Secondary | ICD-10-CM | POA: Diagnosis not present

## 2016-08-02 DIAGNOSIS — N2581 Secondary hyperparathyroidism of renal origin: Secondary | ICD-10-CM | POA: Diagnosis not present

## 2016-08-04 DIAGNOSIS — D509 Iron deficiency anemia, unspecified: Secondary | ICD-10-CM | POA: Diagnosis not present

## 2016-08-04 DIAGNOSIS — N2581 Secondary hyperparathyroidism of renal origin: Secondary | ICD-10-CM | POA: Diagnosis not present

## 2016-08-04 DIAGNOSIS — E119 Type 2 diabetes mellitus without complications: Secondary | ICD-10-CM | POA: Diagnosis not present

## 2016-08-04 DIAGNOSIS — N186 End stage renal disease: Secondary | ICD-10-CM | POA: Diagnosis not present

## 2016-08-04 DIAGNOSIS — D631 Anemia in chronic kidney disease: Secondary | ICD-10-CM | POA: Diagnosis not present

## 2016-08-05 DIAGNOSIS — Z992 Dependence on renal dialysis: Secondary | ICD-10-CM | POA: Diagnosis not present

## 2016-08-05 DIAGNOSIS — E1129 Type 2 diabetes mellitus with other diabetic kidney complication: Secondary | ICD-10-CM | POA: Diagnosis not present

## 2016-08-05 DIAGNOSIS — N186 End stage renal disease: Secondary | ICD-10-CM | POA: Diagnosis not present

## 2016-08-06 DIAGNOSIS — E119 Type 2 diabetes mellitus without complications: Secondary | ICD-10-CM | POA: Diagnosis not present

## 2016-08-06 DIAGNOSIS — N2581 Secondary hyperparathyroidism of renal origin: Secondary | ICD-10-CM | POA: Diagnosis not present

## 2016-08-06 DIAGNOSIS — N186 End stage renal disease: Secondary | ICD-10-CM | POA: Diagnosis not present

## 2016-08-06 DIAGNOSIS — D509 Iron deficiency anemia, unspecified: Secondary | ICD-10-CM | POA: Diagnosis not present

## 2016-08-06 DIAGNOSIS — D631 Anemia in chronic kidney disease: Secondary | ICD-10-CM | POA: Diagnosis not present

## 2016-08-09 DIAGNOSIS — N2581 Secondary hyperparathyroidism of renal origin: Secondary | ICD-10-CM | POA: Diagnosis not present

## 2016-08-09 DIAGNOSIS — D509 Iron deficiency anemia, unspecified: Secondary | ICD-10-CM | POA: Diagnosis not present

## 2016-08-09 DIAGNOSIS — D631 Anemia in chronic kidney disease: Secondary | ICD-10-CM | POA: Diagnosis not present

## 2016-08-09 DIAGNOSIS — N186 End stage renal disease: Secondary | ICD-10-CM | POA: Diagnosis not present

## 2016-08-09 DIAGNOSIS — E119 Type 2 diabetes mellitus without complications: Secondary | ICD-10-CM | POA: Diagnosis not present

## 2016-08-11 DIAGNOSIS — N186 End stage renal disease: Secondary | ICD-10-CM | POA: Diagnosis not present

## 2016-08-11 DIAGNOSIS — E119 Type 2 diabetes mellitus without complications: Secondary | ICD-10-CM | POA: Diagnosis not present

## 2016-08-11 DIAGNOSIS — D509 Iron deficiency anemia, unspecified: Secondary | ICD-10-CM | POA: Diagnosis not present

## 2016-08-11 DIAGNOSIS — D631 Anemia in chronic kidney disease: Secondary | ICD-10-CM | POA: Diagnosis not present

## 2016-08-11 DIAGNOSIS — N2581 Secondary hyperparathyroidism of renal origin: Secondary | ICD-10-CM | POA: Diagnosis not present

## 2016-08-13 DIAGNOSIS — D631 Anemia in chronic kidney disease: Secondary | ICD-10-CM | POA: Diagnosis not present

## 2016-08-13 DIAGNOSIS — E119 Type 2 diabetes mellitus without complications: Secondary | ICD-10-CM | POA: Diagnosis not present

## 2016-08-13 DIAGNOSIS — N2581 Secondary hyperparathyroidism of renal origin: Secondary | ICD-10-CM | POA: Diagnosis not present

## 2016-08-13 DIAGNOSIS — N186 End stage renal disease: Secondary | ICD-10-CM | POA: Diagnosis not present

## 2016-08-13 DIAGNOSIS — D509 Iron deficiency anemia, unspecified: Secondary | ICD-10-CM | POA: Diagnosis not present

## 2016-08-16 DIAGNOSIS — N186 End stage renal disease: Secondary | ICD-10-CM | POA: Diagnosis not present

## 2016-08-16 DIAGNOSIS — N2581 Secondary hyperparathyroidism of renal origin: Secondary | ICD-10-CM | POA: Diagnosis not present

## 2016-08-16 DIAGNOSIS — D509 Iron deficiency anemia, unspecified: Secondary | ICD-10-CM | POA: Diagnosis not present

## 2016-08-16 DIAGNOSIS — E119 Type 2 diabetes mellitus without complications: Secondary | ICD-10-CM | POA: Diagnosis not present

## 2016-08-16 DIAGNOSIS — D631 Anemia in chronic kidney disease: Secondary | ICD-10-CM | POA: Diagnosis not present

## 2016-08-18 DIAGNOSIS — D509 Iron deficiency anemia, unspecified: Secondary | ICD-10-CM | POA: Diagnosis not present

## 2016-08-18 DIAGNOSIS — N186 End stage renal disease: Secondary | ICD-10-CM | POA: Diagnosis not present

## 2016-08-18 DIAGNOSIS — D631 Anemia in chronic kidney disease: Secondary | ICD-10-CM | POA: Diagnosis not present

## 2016-08-18 DIAGNOSIS — E119 Type 2 diabetes mellitus without complications: Secondary | ICD-10-CM | POA: Diagnosis not present

## 2016-08-18 DIAGNOSIS — N2581 Secondary hyperparathyroidism of renal origin: Secondary | ICD-10-CM | POA: Diagnosis not present

## 2016-08-20 DIAGNOSIS — D509 Iron deficiency anemia, unspecified: Secondary | ICD-10-CM | POA: Diagnosis not present

## 2016-08-20 DIAGNOSIS — D631 Anemia in chronic kidney disease: Secondary | ICD-10-CM | POA: Diagnosis not present

## 2016-08-20 DIAGNOSIS — N2581 Secondary hyperparathyroidism of renal origin: Secondary | ICD-10-CM | POA: Diagnosis not present

## 2016-08-20 DIAGNOSIS — N186 End stage renal disease: Secondary | ICD-10-CM | POA: Diagnosis not present

## 2016-08-20 DIAGNOSIS — E119 Type 2 diabetes mellitus without complications: Secondary | ICD-10-CM | POA: Diagnosis not present

## 2016-08-21 ENCOUNTER — Encounter (HOSPITAL_COMMUNITY): Payer: Self-pay

## 2016-08-21 ENCOUNTER — Emergency Department (HOSPITAL_COMMUNITY): Payer: Medicare Other

## 2016-08-21 ENCOUNTER — Inpatient Hospital Stay (HOSPITAL_COMMUNITY)
Admission: EM | Admit: 2016-08-21 | Discharge: 2016-08-26 | DRG: 602 | Disposition: A | Discharge status: Skilled Nursing Facility | Payer: Medicare Other | Attending: Internal Medicine | Admitting: Internal Medicine

## 2016-08-21 DIAGNOSIS — L02212 Cutaneous abscess of back [any part, except buttock]: Principal | ICD-10-CM | POA: Diagnosis present

## 2016-08-21 DIAGNOSIS — L89150 Pressure ulcer of sacral region, unstageable: Secondary | ICD-10-CM | POA: Diagnosis present

## 2016-08-21 DIAGNOSIS — L8990 Pressure ulcer of unspecified site, unspecified stage: Secondary | ICD-10-CM | POA: Insufficient documentation

## 2016-08-21 DIAGNOSIS — L03312 Cellulitis of back [any part except buttock]: Secondary | ICD-10-CM | POA: Diagnosis present

## 2016-08-21 DIAGNOSIS — B999 Unspecified infectious disease: Secondary | ICD-10-CM | POA: Diagnosis not present

## 2016-08-21 DIAGNOSIS — Z89612 Acquired absence of left leg above knee: Secondary | ICD-10-CM | POA: Diagnosis not present

## 2016-08-21 DIAGNOSIS — Z8619 Personal history of other infectious and parasitic diseases: Secondary | ICD-10-CM | POA: Diagnosis not present

## 2016-08-21 DIAGNOSIS — L02219 Cutaneous abscess of trunk, unspecified: Secondary | ICD-10-CM | POA: Diagnosis present

## 2016-08-21 DIAGNOSIS — R748 Abnormal levels of other serum enzymes: Secondary | ICD-10-CM | POA: Diagnosis not present

## 2016-08-21 DIAGNOSIS — E43 Unspecified severe protein-calorie malnutrition: Secondary | ICD-10-CM | POA: Diagnosis present

## 2016-08-21 DIAGNOSIS — L0390 Cellulitis, unspecified: Secondary | ICD-10-CM | POA: Diagnosis not present

## 2016-08-21 DIAGNOSIS — R7989 Other specified abnormal findings of blood chemistry: Secondary | ICD-10-CM

## 2016-08-21 DIAGNOSIS — E785 Hyperlipidemia, unspecified: Secondary | ICD-10-CM | POA: Diagnosis present

## 2016-08-21 DIAGNOSIS — R293 Abnormal posture: Secondary | ICD-10-CM | POA: Diagnosis not present

## 2016-08-21 DIAGNOSIS — Z886 Allergy status to analgesic agent status: Secondary | ICD-10-CM | POA: Diagnosis not present

## 2016-08-21 DIAGNOSIS — R778 Other specified abnormalities of plasma proteins: Secondary | ICD-10-CM

## 2016-08-21 DIAGNOSIS — R531 Weakness: Secondary | ICD-10-CM | POA: Diagnosis not present

## 2016-08-21 DIAGNOSIS — Z992 Dependence on renal dialysis: Secondary | ICD-10-CM

## 2016-08-21 DIAGNOSIS — E1122 Type 2 diabetes mellitus with diabetic chronic kidney disease: Secondary | ICD-10-CM | POA: Diagnosis present

## 2016-08-21 DIAGNOSIS — I10 Essential (primary) hypertension: Secondary | ICD-10-CM

## 2016-08-21 DIAGNOSIS — L03319 Cellulitis of trunk, unspecified: Secondary | ICD-10-CM

## 2016-08-21 DIAGNOSIS — L89152 Pressure ulcer of sacral region, stage 2: Secondary | ICD-10-CM | POA: Diagnosis present

## 2016-08-21 DIAGNOSIS — N186 End stage renal disease: Secondary | ICD-10-CM | POA: Diagnosis not present

## 2016-08-21 DIAGNOSIS — Z89611 Acquired absence of right leg above knee: Secondary | ICD-10-CM | POA: Diagnosis not present

## 2016-08-21 DIAGNOSIS — R278 Other lack of coordination: Secondary | ICD-10-CM | POA: Diagnosis not present

## 2016-08-21 DIAGNOSIS — M5489 Other dorsalgia: Secondary | ICD-10-CM | POA: Diagnosis not present

## 2016-08-21 DIAGNOSIS — D638 Anemia in other chronic diseases classified elsewhere: Secondary | ICD-10-CM | POA: Diagnosis present

## 2016-08-21 DIAGNOSIS — I120 Hypertensive chronic kidney disease with stage 5 chronic kidney disease or end stage renal disease: Secondary | ICD-10-CM | POA: Diagnosis not present

## 2016-08-21 DIAGNOSIS — N2581 Secondary hyperparathyroidism of renal origin: Secondary | ICD-10-CM | POA: Diagnosis not present

## 2016-08-21 DIAGNOSIS — C50511 Malignant neoplasm of lower-outer quadrant of right female breast: Secondary | ICD-10-CM | POA: Diagnosis not present

## 2016-08-21 DIAGNOSIS — D649 Anemia, unspecified: Secondary | ICD-10-CM | POA: Diagnosis not present

## 2016-08-21 DIAGNOSIS — L89312 Pressure ulcer of right buttock, stage 2: Secondary | ICD-10-CM | POA: Diagnosis present

## 2016-08-21 DIAGNOSIS — I360 Nonrheumatic tricuspid (valve) stenosis: Secondary | ICD-10-CM | POA: Diagnosis not present

## 2016-08-21 DIAGNOSIS — D631 Anemia in chronic kidney disease: Secondary | ICD-10-CM | POA: Diagnosis not present

## 2016-08-21 DIAGNOSIS — L89121 Pressure ulcer of left upper back, stage 1: Secondary | ICD-10-CM | POA: Diagnosis not present

## 2016-08-21 LAB — COMPREHENSIVE METABOLIC PANEL
ALBUMIN: 2.2 g/dL — AB (ref 3.5–5.0)
ALK PHOS: 72 U/L (ref 38–126)
ALT: 15 U/L (ref 14–54)
ANION GAP: 7 (ref 5–15)
AST: 28 U/L (ref 15–41)
BUN: 11 mg/dL (ref 6–20)
CALCIUM: 9.6 mg/dL (ref 8.9–10.3)
CHLORIDE: 96 mmol/L — AB (ref 101–111)
CO2: 31 mmol/L (ref 22–32)
Creatinine, Ser: 2.2 mg/dL — ABNORMAL HIGH (ref 0.44–1.00)
GFR calc Af Amer: 23 mL/min — ABNORMAL LOW (ref >60)
GFR calc non Af Amer: 20 mL/min — ABNORMAL LOW (ref >60)
GLUCOSE: 81 mg/dL (ref 65–99)
POTASSIUM: 3.4 mmol/L — AB (ref 3.5–5.1)
SODIUM: 134 mmol/L — AB (ref 135–145)
Total Bilirubin: 0.4 mg/dL (ref 0.3–1.2)
Total Protein: 6.1 g/dL — ABNORMAL LOW (ref 6.5–8.1)

## 2016-08-21 LAB — I-STAT CHEM 8, ED
BUN: 10 mg/dL (ref 6–20)
Calcium, Ion: 1.2 mmol/L (ref 1.15–1.40)
Chloride: 102 mmol/L (ref 101–111)
Creatinine, Ser: 0.7 mg/dL (ref 0.44–1.00)
Glucose, Bld: 155 mg/dL — ABNORMAL HIGH (ref 65–99)
HCT: 39 % (ref 36.0–46.0)
Hemoglobin: 13.3 g/dL (ref 12.0–15.0)
Potassium: 3.1 mmol/L — ABNORMAL LOW (ref 3.5–5.1)
Sodium: 139 mmol/L (ref 135–145)
TCO2: 23 mmol/L (ref 0–100)

## 2016-08-21 LAB — CBC WITH DIFFERENTIAL/PLATELET
Basophils Absolute: 0 10*3/uL (ref 0.0–0.1)
Basophils Relative: 0 %
Eosinophils Absolute: 0 10*3/uL (ref 0.0–0.7)
Eosinophils Relative: 0 %
HCT: 28.8 % — ABNORMAL LOW (ref 36.0–46.0)
HEMOGLOBIN: 9.6 g/dL — AB (ref 12.0–15.0)
LYMPHS ABS: 1.6 10*3/uL (ref 0.7–4.0)
Lymphocytes Relative: 17 %
MCH: 30.2 pg (ref 26.0–34.0)
MCHC: 33.3 g/dL (ref 30.0–36.0)
MCV: 90.6 fL (ref 78.0–100.0)
MONOS PCT: 7 %
Monocytes Absolute: 0.6 10*3/uL (ref 0.1–1.0)
NEUTROS ABS: 7 10*3/uL (ref 1.7–7.7)
NEUTROS PCT: 76 %
Platelets: 184 10*3/uL (ref 150–400)
RBC: 3.18 MIL/uL — AB (ref 3.87–5.11)
RDW: 14.7 % (ref 11.5–15.5)
WBC: 9.2 10*3/uL (ref 4.0–10.5)

## 2016-08-21 LAB — TROPONIN I
TROPONIN I: 0.24 ng/mL — AB (ref <0.03)
Troponin I: 0.33 ng/mL (ref <0.03)

## 2016-08-21 LAB — MRSA PCR SCREENING: MRSA by PCR: NEGATIVE

## 2016-08-21 LAB — I-STAT CG4 LACTIC ACID, ED: Lactic Acid, Venous: 1.42 mmol/L (ref 0.5–1.9)

## 2016-08-21 MED ORDER — ACETAMINOPHEN 325 MG PO TABS
650.0000 mg | ORAL_TABLET | Freq: Four times a day (QID) | ORAL | Status: DC | PRN
  Administered 2016-08-23: 650 mg via ORAL

## 2016-08-21 MED ORDER — VANCOMYCIN HCL IN DEXTROSE 1-5 GM/200ML-% IV SOLN
1000.0000 mg | Freq: Once | INTRAVENOUS | Status: DC
  Filled 2016-08-21: qty 200

## 2016-08-21 MED ORDER — ONDANSETRON HCL 4 MG PO TABS: 4.0000 mg | ORAL_TABLET | Freq: Four times a day (QID) | ORAL | Status: DC | PRN

## 2016-08-21 MED ORDER — CINACALCET HCL 30 MG PO TABS
60.0000 mg | ORAL_TABLET | Freq: Every day | ORAL | Status: DC
  Administered 2016-08-21 – 2016-08-25 (×5): 60 mg via ORAL
  Filled 2016-08-21 (×6): qty 2

## 2016-08-21 MED ORDER — SODIUM CHLORIDE 0.9% FLUSH
3.0000 mL | INTRAVENOUS | Status: DC | PRN
Start: 2016-08-21 — End: 2016-08-26

## 2016-08-21 MED ORDER — MORPHINE SULFATE (PF) 2 MG/ML IV SOLN
4.0000 mg | Freq: Once | INTRAVENOUS | Status: AC
  Administered 2016-08-21: 4 mg via INTRAVENOUS
  Filled 2016-08-21: qty 2

## 2016-08-21 MED ORDER — ANASTROZOLE 1 MG PO TABS
1.0000 mg | ORAL_TABLET | Freq: Every day | ORAL | Status: DC
  Administered 2016-08-21 – 2016-08-26 (×6): 1 mg via ORAL
  Filled 2016-08-21 (×7): qty 1

## 2016-08-21 MED ORDER — HEPARIN SODIUM (PORCINE) 5000 UNIT/ML IJ SOLN
5000.0000 [IU] | Freq: Three times a day (TID) | INTRAMUSCULAR | Status: DC
  Administered 2016-08-21 – 2016-08-23 (×6): 5000 [IU] via SUBCUTANEOUS
  Filled 2016-08-21 (×7): qty 1

## 2016-08-21 MED ORDER — VANCOMYCIN HCL IN DEXTROSE 1-5 GM/200ML-% IV SOLN
1000.0000 mg | Freq: Once | INTRAVENOUS | Status: AC
  Administered 2016-08-21: 1000 mg via INTRAVENOUS
  Filled 2016-08-21: qty 200

## 2016-08-21 MED ORDER — SODIUM CHLORIDE 0.9 % IV SOLN
250.0000 mL | INTRAVENOUS | Status: DC | PRN
  Administered 2016-08-21: 250 mL via INTRAVENOUS

## 2016-08-21 MED ORDER — ONDANSETRON HCL 4 MG/2ML IJ SOLN: 4.0000 mg | Freq: Four times a day (QID) | INTRAMUSCULAR | Status: DC | PRN

## 2016-08-21 MED ORDER — ONDANSETRON HCL 4 MG/2ML IJ SOLN
4.0000 mg | Freq: Once | INTRAMUSCULAR | Status: AC
  Administered 2016-08-21: 4 mg via INTRAVENOUS
  Filled 2016-08-21: qty 2

## 2016-08-21 MED ORDER — VANCOMYCIN HCL 500 MG IV SOLR
500.0000 mg | INTRAVENOUS | Status: DC
  Filled 2016-08-21: qty 500

## 2016-08-21 MED ORDER — METOPROLOL SUCCINATE ER 25 MG PO TB24
25.0000 mg | ORAL_TABLET | Freq: Every day | ORAL | Status: DC
  Administered 2016-08-22: 25 mg via ORAL
  Filled 2016-08-21: qty 1

## 2016-08-21 MED ORDER — NEPRO/CARBSTEADY PO LIQD
237.0000 mL | Freq: Two times a day (BID) | ORAL | Status: DC
  Administered 2016-08-22 – 2016-08-26 (×5): 237 mL via ORAL

## 2016-08-21 MED ORDER — ACETAMINOPHEN 650 MG RE SUPP: 650.0000 mg | Freq: Four times a day (QID) | RECTAL | Status: DC | PRN

## 2016-08-21 MED ORDER — LANTHANUM CARBONATE 500 MG PO CHEW
1000.0000 mg | CHEWABLE_TABLET | Freq: Every day | ORAL | Status: DC
  Administered 2016-08-21 – 2016-08-25 (×5): 1000 mg via ORAL
  Filled 2016-08-21 (×5): qty 2

## 2016-08-21 MED ORDER — SODIUM CHLORIDE 0.9% FLUSH
3.0000 mL | Freq: Two times a day (BID) | INTRAVENOUS | Status: DC
  Administered 2016-08-21 – 2016-08-26 (×8): 3 mL via INTRAVENOUS

## 2016-08-21 MED ORDER — ALBUTEROL SULFATE (2.5 MG/3ML) 0.083% IN NEBU: 2.5000 mg | INHALATION_SOLUTION | RESPIRATORY_TRACT | Status: DC | PRN

## 2016-08-21 MED ORDER — LIDOCAINE-EPINEPHRINE (PF) 2 %-1:200000 IJ SOLN
10.0000 mL | Freq: Once | INTRAMUSCULAR | Status: AC
  Administered 2016-08-21: 10 mL
  Filled 2016-08-21: qty 20

## 2016-08-21 MED ORDER — CEFAZOLIN SODIUM-DEXTROSE 1-4 GM/50ML-% IV SOLN
1.0000 g | Freq: Once | INTRAVENOUS | Status: AC
  Administered 2016-08-21: 1 g via INTRAVENOUS
  Filled 2016-08-21: qty 50

## 2016-08-21 MED ORDER — SODIUM CHLORIDE 0.9 % IV BOLUS (SEPSIS)
1000.0000 mL | Freq: Once | INTRAVENOUS | Status: AC
  Administered 2016-08-21: 1000 mL via INTRAVENOUS

## 2016-08-21 MED ORDER — OXYCODONE HCL 5 MG PO TABS
5.0000 mg | ORAL_TABLET | ORAL | Status: DC | PRN
  Administered 2016-08-22 – 2016-08-25 (×4): 5 mg via ORAL
  Filled 2016-08-21 (×3): qty 1

## 2016-08-21 MED ORDER — RENA-VITE PO TABS
1.0000 | ORAL_TABLET | Freq: Every day | ORAL | Status: DC
  Administered 2016-08-21 – 2016-08-25 (×5): 1 via ORAL
  Filled 2016-08-21 (×7): qty 1

## 2016-08-21 NOTE — Progress Notes (Signed)
Pharmacy Antibiotic Note  [name] is a 83 y.o. female admitted on 08/21/2016 with cellulitis.   HD MWF  Plan: vanc 1g then 500 mg with HD  Weight: 98 lb 3.2 oz (44.5 kg)  Temp (24hrs), Avg:99.1 F (37.3 C), Min:98.8 F (37.1 C), Max:99.6 F (37.6 C)   Recent Labs Lab 08/21/16 1052 08/21/16 1056 08/21/16 1057  WBC 9.2  --   --   CREATININE 2.20*  --  0.70  LATICACIDVEN  --  1.42  --     Estimated Creatinine Clearance: 38.7 mL/min (by C-G formula based on SCr of 0.7 mg/dL).    Allergies  Allergen Reactions  . Aspirin Other (See Comments)    NO BLOOD THINNERS OF ANY KIND-bleeding events    Levester Fresh, PharmD, BCPS, BCCCP Clinical Pharmacist 08/21/2016 8:44 PM

## 2016-08-21 NOTE — Consult Note (Addendum)
Reason for Consult: Left scapular necrotic pressure ulcer  Referring Physician: Dr. Mitzi Hansen is an 83 y.o. female.  HPI:  A 83 year old female with a two-week history left necrotic pressure ulcer scapula. Patient states this is continued to cause pain. Patient is immobile and states that this area was contact area of a. Patient presented to the ER secondary to continued pain. Upon evaluation ER the patient underwent a cruciate incision over the area of the superficial skin necrosis.  Gen. surgery was counseled for further evaluation and management is minimal purulence was expressed.  Past Medical History:  Diagnosis Date  . Anemia   . Arthritis   . Bilateral breast cancer ([place])   . DM (diabetes mellitus) ([place])   . Dyslipidemia   . ESRD (end stage renal disease) on dialysis ([place])    a. [place]: MWF (07/10/2014)  . Hepatitis C   . Hyperparathyroidism   . Hypertension   . PVD (peripheral vascular disease) ([place])    a. s/p peripheral angiogram on 07/10/14 with no PCI and cont med Rx  . Vascular disease     Past Surgical History:  Procedure Laterality Date  . ABDOMINAL HYSTERECTOMY    . AMPUTATION Bilateral 11/21/2015   Procedure: AMPUTATION ABOVE KNEE;  Surgeon: Newt Minion, MD;  Location: [facility];  Service: Orthopedics;  Laterality: Bilateral;  . APPENDECTOMY    . ARTERIOVENOUS GRAFT PLACEMENT Left 03/20/09   "had right arm previously but couldn't use it anymore"  . BACK SURGERY    . BREAST BIOPSY Bilateral   . BREAST BIOPSY Right 08/08/2013   Procedure: Evacuation Hematoma Right Chest;  Surgeon: [name], MD;  Location: [facility];  Service: General;  Laterality: Right;  . CATARACT EXTRACTION Right   . DG AV DIALYSIS GRAFT DECLOT OR Left 01/27/11, 02/15/11, 03/15/11, 10/13/11   lua  . EVACUATION BREAST HEMATOMA  2001; 2015   left; right  . INSERTION OF DIALYSIS CATHETER Right 11/15/2015   Procedure: INSERTION OF Right Femoral  DIALYSIS CATHETER.;  Surgeon: Rosetta Posner,  MD;  Location: [facility];  Service: Vascular;  Laterality: Right;  . MASTECTOMY Left 1970's?  Marland Kitchen MASTECTOMY COMPLETE / SIMPLE Right 08/07/2013  . MEDIAN NERVE REPAIR Left    "decompression"  . PERIPHERAL VASCULAR CATHETERIZATION N/A 07/10/2014   Procedure: Abdominal Aortogram;  Surgeon: Wellington Hampshire, MD;  Location: [facility];  Service: Cardiovascular;  Laterality: N/A;  . POSTERIOR FUSION CERVICAL SPINE     "have 6 screws"  . THROMBECTOMY AND REVISION OF ARTERIOVENTOUS (AV) GORETEX  GRAFT Left 04/18/2009  . THROMBECTOMY AND REVISION OF ARTERIOVENTOUS (AV) GORETEX  GRAFT Left 11/15/2015   Procedure: Thrombectomy and Revision of Left ARm AV Gortex Graft.;  Surgeon: Rosetta Posner, MD;  Location: [facility];  Service: Vascular;  Laterality: Left;  . TOE AMPUTATION Right    1,2nd toes  . TONSILLECTOMY    . TOTAL MASTECTOMY Right 08/07/2013   Procedure: RIGHT TOTAL MASTECTOMY;  Surgeon: [name], MD;  Location: [facility];  Service: General;  Laterality: Right;  . UTERINE FIBROID SURGERY      Family History  Problem Relation Age of Onset  . Cancer Mother   . Hypertension Mother     Social History:  reports that she has quit smoking. Her smoking use included Cigarettes. She has never used smokeless tobacco. She reports that she drinks alcohol. She reports that she does not use drugs.  Allergies:  Allergies  Allergen Reactions  . Aspirin Other (See Comments)    NO BLOOD THINNERS OF ANY KIND-bleeding events    Medications: I have reviewed the patient's current medications.  Results for orders placed or performed during the hospital encounter of 08/21/16 (from the past 48 hour(s))  Comprehensive metabolic panel     Status: Abnormal   Collection Time: 08/21/16 10:52 AM  Result Value Ref Range   Sodium 134 (L) 135 - 145 mmol/L   Potassium 3.4 (L) 3.5 - 5.1 mmol/L   Chloride 96 (L) 101 - 111 mmol/L   CO2 31 22 - 32 mmol/L   Glucose, Bld 81 65 - 99 mg/dL   BUN 11 6 - 20 mg/dL    Creatinine, Ser 2.20 (H) 0.44 - 1.00 mg/dL   Calcium 9.6 8.9 - 10.3 mg/dL   Total Protein 6.1 (L) 6.5 - 8.1 g/dL   Albumin 2.2 (L) 3.5 - 5.0 g/dL   AST 28 15 - 41 U/L   ALT 15 14 - 54 U/L   Alkaline Phosphatase 72 38 - 126 U/L   Total Bilirubin 0.4 0.3 - 1.2 mg/dL   GFR calc non Af Amer 20 (L) >60 mL/min   GFR calc Af Amer 23 (L) >60 mL/min    Comment: (NOTE) The eGFR has been calculated using the CKD EPI equation. This calculation has not been validated in all clinical situations. eGFR's persistently <60 mL/min signify possible Chronic Kidney Disease.    Anion gap 7 5 - 15  Troponin I     Status: Abnormal   Collection Time: 08/21/16 10:52 AM  Result Value Ref Range   Troponin I 0.33 (HH) <0.03 ng/mL    Comment: CRITICAL RESULT CALLED TO, READ BACK BY AND VERIFIED WITH: M ROSSER AT 1148 ON 06.16.2018 BY NBROOKS   CBC with Differential     Status: Abnormal   Collection Time: 08/21/16 10:52 AM  Result Value Ref Range   WBC 9.2 4.0 - 10.5 K/uL   RBC 3.18 (L) 3.87 - 5.11 MIL/uL   Hemoglobin 9.6 (L) 12.0 - 15.0 g/dL   HCT 28.8 (L) 36.0 - 46.0 %   MCV 90.6 78.0 - 100.0 fL   MCH 30.2 26.0 - 34.0 pg   MCHC 33.3 30.0 - 36.0 g/dL   RDW 14.7 11.5 - 15.5 %   Platelets 184 150 - 400 K/uL   Neutrophils Relative % 76 %   Neutro Abs 7.0 1.7 - 7.7 K/uL   Lymphocytes Relative 17 %   Lymphs Abs 1.6 0.7 - 4.0 K/uL   Monocytes Relative 7 %   Monocytes Absolute 0.6 0.1 - 1.0 K/uL   Eosinophils Relative 0 %   Eosinophils Absolute 0.0 0.0 - 0.7 K/uL   Basophils Relative 0 %   Basophils Absolute 0.0 0.0 - 0.1 K/uL  I-Stat CG4 Lactic Acid, ED     Status: None   Collection Time: 08/21/16 10:56 AM  Result Value Ref Range   Lactic Acid, Venous 1.42 0.5 - 1.9 mmol/L  I-stat chem 8, ed     Status: Abnormal   Collection Time: 08/21/16 10:57 AM  Result Value Ref Range   Sodium 139 135 - 145 mmol/L   Potassium 3.1 (L) 3.5 - 5.1 mmol/L   Chloride 102 101 - 111 mmol/L   BUN 10 6 - 20 mg/dL    Creatinine, Ser 0.70 0.44 - 1.00 mg/dL   Glucose, Bld 155 (H) 65 - 99 mg/dL   Calcium, Ion 1.20 1.15 - 1.40  mmol/L   TCO2 23 0 - 100 mmol/L   Hemoglobin 13.3 12.0 - 15.0 g/dL   HCT 39.0 36.0 - 46.0 %  Wound or Superficial Culture     Status: None (Preliminary result)   Collection Time: 08/21/16 12:31 PM  Result Value Ref Range   Specimen Description BACK    Special Requests NONE    Gram Stain      RARE WBC PRESENT, PREDOMINANTLY PMN MODERATE GRAM POSITIVE COCCI IN PAIRS FEW GRAM VARIABLE ROD Performed at [facility], [place] [address], [place], [place] [address]    Culture PENDING    Report Status PENDING     Dg Chest 2 View  Result Date: 08/21/2016 CLINICAL DATA:  Weakness. EXAM: CHEST  2 VIEW COMPARISON:  11/15/2015 FINDINGS: Normal heart size. No pleural effusion or edema. Surgical clips are identified within the left axilla. A stent graft is identified projecting over the superior mediastinum. No airspace opacities identified. IMPRESSION: No active cardiopulmonary disease. Electronically Signed   By: Kerby Moors M.D.   On: 08/21/2016 10:19    Review of Systems  Constitutional: Negative for chills, fever and weight loss.  HENT: Negative for ear pain, hearing loss and tinnitus.   Respiratory: Negative for cough and hemoptysis.   Cardiovascular: Negative for chest pain, palpitations and orthopnea.  Genitourinary: Negative for dysuria and urgency.  Musculoskeletal: Negative for back pain and myalgias.  Psychiatric/Behavioral: Negative for depression, substance abuse and suicidal ideas.  All other systems reviewed and are negative.  Blood pressure 123/67, pulse 77, temperature 98.8 F (37.1 C), temperature source Oral, resp. rate 16, SpO2 92 %. Physical Exam  Constitutional: She is oriented to person, place, and time. She appears well-developed and well-nourished. No distress.  HENT:  Head: Normocephalic and atraumatic.  Right Ear: External ear normal.  Left Ear:  External ear normal.  Mouth/Throat: No oropharyngeal exudate.  Eyes: Conjunctivae and EOM are normal. Pupils are equal, round, and reactive to light. Right eye exhibits no discharge. Left eye exhibits no discharge. No scleral icterus.  Neck: Normal range of motion. Neck supple. No tracheal deviation present. No thyromegaly present.  Cardiovascular: Normal rate, regular rhythm and intact distal pulses.  Exam reveals friction rub. Exam reveals no gallop.   No murmur heard. LUE thrill over AVF   Respiratory: Effort normal and breath sounds normal. No stridor. No respiratory distress. She has no wheezes. She has no rales. She exhibits no tenderness.  GI: Soft. Bowel sounds are normal. She exhibits no distension and no mass. There is no tenderness. There is no rebound and no guarding.  Lymphadenopathy:    She has no cervical adenopathy.  Neurological: She is alert and oriented to person, place, and time.  Skin: Skin is warm and dry. No rash noted. She is not diaphoretic. No erythema.       Assessment/Plan: 83 year old female with superficial skin necrosis secondary to pressure ulcer necrosis. Principal Problem:   Cellulitis and abscess of trunk Active Problems:   Breast cancer of lower-outer quadrant of right female breast (HCC)   End stage renal disease (HCC)   Protein-calorie malnutrition, severe   S/P AKA (above knee amputation) bilateral ([place])   1. This area was debrided the bedside. There was large amount of necrotic tissue underneath the superficial skin necrosis. This was debrided. There is minimal bleeding. This was packed with saline soaked gauze. 2. Patient will require hydrotherapy 3.Wet-to-dry dressing changes TID 4. Call back with any questions. Rosario Jacks., [place] 08/21/2016,  8:11 PM

## 2016-08-21 NOTE — ED Notes (Signed)
Attempted to draw blood cultures 2 times in right hand but was unsuccessful

## 2016-08-21 NOTE — ED Triage Notes (Signed)
Pt here by ems with c/o bed sore to left upper back and lumber region. Pt state she had this sore for about two weeks.  BP 140/80  P 84  RR 18 and cbg 80. Patient current on  dialysis [facility].

## 2016-08-21 NOTE — H&P (Signed)
[facility] History and Physical  [name] UVO:[id number] DOB: June 05, 1932 DOA: 08/21/2016   PCP: [name], MD  Specialists: None  Chief Complaint: Sore on the back  HPI: [name] is a 83 y.o. female with a past medical history of end-stage renal disease on hemodialysis, hypertension, breast cancer, history of peripheral vascular disease status post bilateral AKA, who presented with complaints of a sore on her left upper back. This has been ongoing for about 2 weeks and has been getting progressively worse. This then developed a foul smell. She has not noticed any drainage. Pain was getting worse, so she decided to seek attention. Patient is not a very good historian. However, she did deny nausea, vomiting, diarrhea. No chest pain or shortness of breath. She did go for dialysis on Friday and completed the treatment.  In the emergency department, patient was found to have cellulitis and abscess of the left upper back. She will need hospitalization.  Home Medications: Prior to Admission medications   Medication Sig Start Date End Date Taking? Authorizing Provider  anastrozole (ARIMIDEX) 1 MG tablet take 1 tablet by mouth once daily 07/04/16  Yes Shadad, Mathis Dad, MD  B Complex-C-Folic Acid (DIALYVITE TABLET) TABS Take 1 tablet by mouth daily after supper. 10/31/15  Yes [provider]  cinacalcet (SENSIPAR) 60 MG tablet Take 60 mg by mouth daily after supper.   Yes [provider]  lanthanum (FOSRENOL) 1000 MG chewable tablet Chew 1,000 mg by mouth daily after supper.    Yes [provider]  metoprolol succinate (TOPROL XL) 25 MG 24 hr tablet Take 1 tablet (25 mg total) by mouth daily. Patient taking differently: Take 50 mg by mouth daily.  11/24/15  Yes Elgergawy, Silver Huguenin, MD  Nutritional Supplements (FEEDING SUPPLEMENT, NEPRO CARB STEADY,) LIQD Take 237 mLs by mouth 2 (two) times daily between meals. 11/24/15   Elgergawy, Silver Huguenin, MD     Allergies:  Allergies  Allergen Reactions  . Aspirin Other (See Comments)    NO BLOOD THINNERS OF ANY KIND-bleeding events    Past Medical History: Past Medical History:  Diagnosis Date  . Anemia   . Arthritis   . Bilateral breast cancer ([place])   . DM (diabetes mellitus) ([place])   . Dyslipidemia   . ESRD (end stage renal disease) on dialysis ([place])    a. [place]: MWF (07/10/2014)  . Hepatitis C   . Hyperparathyroidism   . Hypertension   . PVD (peripheral vascular disease) ([place])    a. s/p peripheral angiogram on 07/10/14 with no PCI and cont med Rx  . Vascular disease     Past Surgical History:  Procedure Laterality Date  . ABDOMINAL HYSTERECTOMY    . AMPUTATION Bilateral 11/21/2015   Procedure: AMPUTATION ABOVE KNEE;  Surgeon: Newt Minion, MD;  Location: [facility];  Service: Orthopedics;  Laterality: Bilateral;  . APPENDECTOMY    . ARTERIOVENOUS GRAFT PLACEMENT Left 03/20/09   "had right arm previously but couldn't use it anymore"  . BACK SURGERY    . BREAST BIOPSY Bilateral   . BREAST BIOPSY Right 08/08/2013   Procedure: Evacuation Hematoma Right Chest;  Surgeon: [name], MD;  Location: [facility];  Service: General;  Laterality: Right;  . CATARACT EXTRACTION Right   . DG AV DIALYSIS GRAFT DECLOT OR Left 01/27/11, 02/15/11, 03/15/11, 10/13/11   lua  . EVACUATION BREAST HEMATOMA  2001; 2015   left; right  . INSERTION OF DIALYSIS CATHETER Right  11/15/2015   Procedure: INSERTION OF Right Femoral  DIALYSIS CATHETER.;  Surgeon: Rosetta Posner, MD;  Location: [facility];  Service: Vascular;  Laterality: Right;  . MASTECTOMY Left 1970's?  Marland Kitchen MASTECTOMY COMPLETE / SIMPLE Right 08/07/2013  . MEDIAN NERVE REPAIR Left    "decompression"  . PERIPHERAL VASCULAR CATHETERIZATION N/A 07/10/2014   Procedure: Abdominal Aortogram;  Surgeon: Wellington Hampshire, MD;  Location: [facility];  Service: Cardiovascular;  Laterality: N/A;  . POSTERIOR FUSION CERVICAL SPINE     "have 6 screws"  .  THROMBECTOMY AND REVISION OF ARTERIOVENTOUS (AV) GORETEX  GRAFT Left 04/18/2009  . THROMBECTOMY AND REVISION OF ARTERIOVENTOUS (AV) GORETEX  GRAFT Left 11/15/2015   Procedure: Thrombectomy and Revision of Left ARm AV Gortex Graft.;  Surgeon: Rosetta Posner, MD;  Location: [facility];  Service: Vascular;  Laterality: Left;  . TOE AMPUTATION Right    1,2nd toes  . TONSILLECTOMY    . TOTAL MASTECTOMY Right 08/07/2013   Procedure: RIGHT TOTAL MASTECTOMY;  Surgeon: [name], MD;  Location: [facility];  Service: General;  Laterality: Right;  . UTERINE FIBROID SURGERY      Social History: Her living situation is unclear. It appears that she lives by herself but has aides who come in to help her.   Family History:  Family History  Problem Relation Age of Onset  . Cancer Mother   . Hypertension Mother      Review of Systems - History obtained from the patient General ROS: positive for  - fatigue Psychological ROS: negative Ophthalmic ROS: negative ENT ROS: negative Allergy and Immunology ROS: negative Hematological and Lymphatic ROS: negative Endocrine ROS: negative Respiratory ROS: no cough, shortness of breath, or wheezing Cardiovascular ROS: no chest pain or dyspnea on exertion Gastrointestinal ROS: no abdominal pain, change in bowel habits, or black or bloody stools Genito-Urinary ROS: no dysuria, trouble voiding, or hematuria Musculoskeletal ROS: negative Neurological ROS: no TIA or stroke symptoms Dermatological ROS: as in hpi  Physical Examination  Vitals:   08/21/16 1100 08/21/16 1100 08/21/16 1217 08/21/16 1234  BP: (!) 146/46  (!) 118/47 (!) 118/47  Pulse:   73 73  Resp: 20   18  Temp:  99 F (37.2 C)    TempSrc:  Rectal    SpO2:   97% 100%    BP (!) 118/47 (BP Location: Right Arm)   Pulse 73   Temp 99 F (37.2 C) (Rectal)   Resp 18   SpO2 100%   General appearance: alert, cooperative, appears stated age, distracted and no distress Head: Normocephalic, without  obvious abnormality, atraumatic Eyes: conjunctivae/corneas clear. PERRL, EOM's intact.  Throat: lips, mucosa, and tongue normal; teeth and gums normal Neck: no adenopathy, no carotid bruit, no JVD, supple, symmetrical, trachea midline and thyroid not enlarged, symmetric, no tenderness/mass/nodules Back: Swelling noted in the left upper back with erythema, fluctuation. Yellow pus was noted. Resp: clear to auscultation bilaterally Cardio: regular rate and rhythm, S1, S2 normal, no murmur, click, rub or gallop GI: soft, non-tender; bowel sounds normal; no masses,  no organomegaly Extremities: Status post Bilateral AKA. She has a graft in the left upper arm. Pulses: Radial pulses appreciated in the right. She's got a bruit in the left graft Skin: Skin color, texture, turgor normal. No rashes or lesions Neurologic: No focal deficits   Labs on Admission: I have personally reviewed following labs and imaging studies  CBC:  Recent Labs Lab 08/21/16 1052 08/21/16 1057  WBC 9.2  --   NEUTROABS 7.0  --   HGB 9.6* 13.3  HCT 28.8* 39.0  MCV 90.6  --   PLT 184  --    Basic Metabolic Panel:  Recent Labs Lab 08/21/16 1052 08/21/16 1057  NA 134* 139  K 3.4* 3.1*  CL 96* 102  CO2 31  --   GLUCOSE 81 155*  BUN 11 10  CREATININE 2.20* 0.70  CALCIUM 9.6  --    GFR: CrCl cannot be calculated (Unknown ideal weight.).  Liver Function Tests:  Recent Labs Lab 08/21/16 1052  AST 28  ALT 15  ALKPHOS 72  BILITOT 0.4  PROT 6.1*  ALBUMIN 2.2*   Cardiac Enzymes:  Recent Labs Lab 08/21/16 1052  TROPONINI 0.33*    Radiological Exams on Admission: Dg Chest 2 View  Result Date: 08/21/2016 CLINICAL DATA:  Weakness. EXAM: CHEST  2 VIEW COMPARISON:  11/15/2015 FINDINGS: Normal heart size. No pleural effusion or edema. Surgical clips are identified within the left axilla. A stent graft is identified projecting over the superior mediastinum. No airspace opacities identified. IMPRESSION:  No active cardiopulmonary disease. Electronically Signed   By: Kerby Moors M.D.   On: 08/21/2016 10:19    My interpretation of Electrocardiogram: Sinus rhythm in the 70s. Left bundle branch block. Similar to previous EKG.   Problem List  Principal Problem:   Cellulitis and abscess of trunk Active Problems:   Breast cancer of lower-outer quadrant of right female breast (HCC)   End stage renal disease (HCC)   Protein-calorie malnutrition, severe   S/P AKA (above knee amputation) bilateral (HCC)   Assessment: This is a 83 year old African-American female with a past medical history as stated earlier, who presents with pain and swelling involving her left upper back. She is found to have cellulitis and abscess in that area. She will need IV antibiotics as well as further debridement.  Plan: #1 Cellulitis and abscess involving the left upper back: Emergency room physician did try to incise and drain this abscess, however, were only partially successful. Patient will likely need further debridement. General surgery has been consulted but would like to be notified when the patient arrives at [facility] with vancomycin and for now. Wound Cultures were sent by the ED provider. Lactic acid level was normal. WBC is normal.  #2 Elevated troponin: Patient denies any chest pain. EKG similar to previous EKG. Unclear why Troponin is elevated. Could be due to her renal failure. Will repeat the values.  #3 End-stage renal disease on hemodialysis. She is dialyzed on Monday, Wednesday, Friday. She was last dialyzed yesterday. Nephrology will need to be notified on Sunday so that she could be dialyzed Monday.  #4 history of breast cancer: Continue home medications.  #5 Essential hypertension: Continue home medications  #6 Normocytic anemia, most likely secondary to chronic disease. Appears to be at baseline. Continue to monitor  #7 history of peripheral vascular disease status post  bilateral AKA. No concerning finding on the stumps bilaterally  DVT Prophylaxis: Heparin subcutaneously Code Status: Full code Family Communication: Discussed with the patient  Consults called: General surgery contacted by ED provider  Admission status: Inpatient   Severity of Illness: The appropriate patient status for this patient is INPATIENT. Inpatient status is judged to be reasonable and necessary in order to provide the required intensity of service to ensure the patient's safety. The patient's presenting symptoms, physical exam findings, and initial radiographic and laboratory data in the context of  their chronic comorbidities is felt to place them at high risk for further clinical deterioration. Furthermore, it is not anticipated that the patient will be medically stable for discharge from the hospital within 2 midnights of admission. The following factors support the patient status of inpatient.   " The patient's presenting symptoms include pain, swelling left upper back. " The worrisome physical exam findings include cellulitis and abscess. " The chronic co-morbidities include end-stage renal disease.   * I certify that at the point of admission it is my clinical judgment that the patient will require inpatient hospital care spanning beyond 2 midnights from the point of admission due to high intensity of service, high risk for further deterioration and high frequency of surveillance required.*  Further management decisions will depend on results of further testing and patient's response to treatment.   [facility]  [facility] Pager [phone]  If 7PM-7AM, please contact night-coverage [email or web address] Password [email or web address]  08/21/2016, 2:54 PM

## 2016-08-21 NOTE — ED Notes (Signed)
[facility] call and waiting for transport.

## 2016-08-21 NOTE — ED Notes (Signed)
Date and time results received: 08/21/16 11:51 AM     Test: TROPONIN  Critical Value: 0.33  Name of Provider Notified: [place]  Orders Received? Or Actions Taken?: Awaiting orders

## 2016-08-21 NOTE — ED Notes (Signed)
[name] pt POA call and was given update regarding pt. Her phone # is [phone]. Please call her back went pt get a ready room.

## 2016-08-21 NOTE — ED Provider Notes (Signed)
[facility] DEPT Provider Note   CSN: [id number] Arrival date & time: 08/21/16  0911     History   Chief Complaint Chief Complaint  Patient presents with  . bed sore    left upper back     HPI DEMAYA [name] is a 83 y.o. female.  Pt presents to the ED today with multiple bed sores.  She said that she's had sores to her buttocks since she was in the nursing home after she had an amputation of both legs in September of 2017.  She has had a sore on her upper left back that has been there for 2 weeks.  She said that there's a broken area on her wheelchair that has been rubbing.  The pt is a dialysis pt and goes MWF.  She has been compliant with this and went yesterday.      Past Medical History:  Diagnosis Date  . Anemia   . Arthritis   . Bilateral breast cancer ([place])   . DM (diabetes mellitus) ([place])   . Dyslipidemia   . ESRD (end stage renal disease) on dialysis ([place])    a. [place]: MWF (07/10/2014)  . Hepatitis C   . Hyperparathyroidism   . Hypertension   . PVD (peripheral vascular disease) ([place])    a. s/p peripheral angiogram on 07/10/14 with no PCI and cont med Rx  . Vascular disease     Patient Active Problem List   Diagnosis Date Noted  . Cellulitis and abscess of trunk 08/21/2016  . S/P AKA (above knee amputation) bilateral ([place]) 04/22/2016  . Protein-calorie malnutrition, severe 11/20/2015  . Osteomyelitis ([place]) 11/17/2015  . Pressure ulcer 11/16/2015  . Absolute anemia 11/16/2015  . Acute post-hemorrhagic anemia 11/15/2015  . Bleeding from dialysis shunt ([place]) 11/15/2015  . Incontinence of feces 11/15/2015  . ESRD (end stage renal disease) on dialysis ([place])   . Hepatitis C   . Hypertension   . Dyslipidemia   . PVD (peripheral vascular disease) ([place]) 07/02/2014  . End stage renal disease ([place]) 08/08/2013  . Postoperative wound hematoma 08/08/2013  . Breast cancer of lower-outer quadrant of right female breast ([place]) 07/19/2013    Past Surgical  History:  Procedure Laterality Date  . ABDOMINAL HYSTERECTOMY    . AMPUTATION Bilateral 11/21/2015   Procedure: AMPUTATION ABOVE KNEE;  Surgeon: Newt Minion, MD;  Location: [facility];  Service: Orthopedics;  Laterality: Bilateral;  . APPENDECTOMY    . ARTERIOVENOUS GRAFT PLACEMENT Left 03/20/09   "had right arm previously but couldn't use it anymore"  . BACK SURGERY    . BREAST BIOPSY Bilateral   . BREAST BIOPSY Right 08/08/2013   Procedure: Evacuation Hematoma Right Chest;  Surgeon: [name], MD;  Location: [facility];  Service: General;  Laterality: Right;  . CATARACT EXTRACTION Right   . DG AV DIALYSIS GRAFT DECLOT OR Left 01/27/11, 02/15/11, 03/15/11, 10/13/11   lua  . EVACUATION BREAST HEMATOMA  2001; 2015   left; right  . INSERTION OF DIALYSIS CATHETER Right 11/15/2015   Procedure: INSERTION OF Right Femoral  DIALYSIS CATHETER.;  Surgeon: Rosetta Posner, MD;  Location: [facility];  Service: Vascular;  Laterality: Right;  . MASTECTOMY Left 1970's?  Marland Kitchen MASTECTOMY COMPLETE / SIMPLE Right 08/07/2013  . MEDIAN NERVE REPAIR Left    "decompression"  . PERIPHERAL VASCULAR CATHETERIZATION N/A 07/10/2014   Procedure: Abdominal Aortogram;  Surgeon: Wellington Hampshire, MD;  Location: [facility];  Service: Cardiovascular;  Laterality: N/A;  . POSTERIOR FUSION CERVICAL SPINE     "have 6 screws"  . THROMBECTOMY AND REVISION OF ARTERIOVENTOUS (AV) GORETEX  GRAFT Left 04/18/2009  . THROMBECTOMY AND REVISION OF ARTERIOVENTOUS (AV) GORETEX  GRAFT Left 11/15/2015   Procedure: Thrombectomy and Revision of Left ARm AV Gortex Graft.;  Surgeon: Rosetta Posner, MD;  Location: [facility];  Service: Vascular;  Laterality: Left;  . TOE AMPUTATION Right    1,2nd toes  . TONSILLECTOMY    . TOTAL MASTECTOMY Right 08/07/2013   Procedure: RIGHT TOTAL MASTECTOMY;  Surgeon: [name], MD;  Location: [facility];  Service: General;  Laterality: Right;  . UTERINE FIBROID SURGERY      OB History    No data available        Home Medications    Prior to Admission medications   Medication Sig Start Date End Date Taking? Authorizing Provider  anastrozole (ARIMIDEX) 1 MG tablet take 1 tablet by mouth once daily 07/04/16  Yes Shadad, Mathis Dad, MD  B Complex-C-Folic Acid (DIALYVITE TABLET) TABS Take 1 tablet by mouth daily after supper. 10/31/15  Yes [provider]  cinacalcet (SENSIPAR) 60 MG tablet Take 60 mg by mouth daily after supper.   Yes [provider]  lanthanum (FOSRENOL) 1000 MG chewable tablet Chew 1,000 mg by mouth daily after supper.    Yes [provider]  metoprolol succinate (TOPROL XL) 25 MG 24 hr tablet Take 1 tablet (25 mg total) by mouth daily. Patient taking differently: Take 50 mg by mouth daily.  11/24/15  Yes Elgergawy, Silver Huguenin, MD  Nutritional Supplements (FEEDING SUPPLEMENT, NEPRO CARB STEADY,) LIQD Take 237 mLs by mouth 2 (two) times daily between meals. 11/24/15   Elgergawy, Silver Huguenin, MD    Family History Family History  Problem Relation Age of Onset  . Cancer Mother   . Hypertension Mother     Social History Social History  Substance Use Topics  . Smoking status: Former Smoker    Types: Cigarettes  . Smokeless tobacco: Never Used     Comment: "stopped smoking in 1969"  . Alcohol use 0.0 oz/week     Comment: "no alcohol since 1969"     Allergies   Aspirin   Review of Systems Review of Systems  Skin: Positive for wound.  All other systems reviewed and are negative.    Physical Exam Updated Vital Signs BP (!) 118/47 (BP Location: Right Arm)   Pulse 73   Temp 99 F (37.2 C) (Rectal)   Resp 18   SpO2 100%   Physical Exam  Constitutional: She appears well-developed and well-nourished.  HENT:  Head: Normocephalic and atraumatic.  Right Ear: External ear normal.  Left Ear: External ear normal.  Nose: Nose normal.  Mouth/Throat: Oropharynx is clear and moist.  Eyes: Conjunctivae and EOM are normal. Pupils are equal, round, and  reactive to light.  Neck: Normal range of motion. Neck supple.  Cardiovascular: Normal rate, regular rhythm, normal heart sounds and intact distal pulses.   Pulmonary/Chest: Effort normal and breath sounds normal.  Bilateral mastectomy  Abdominal: Soft. Bowel sounds are normal.  Musculoskeletal:       Arms: Bilateral aka  Neurological: She is alert.  Psychiatric: She has a normal mood and affect. Her behavior is normal. Judgment and thought content normal.  Nursing note and vitals reviewed.    ED Treatments / Results  Labs (all labs ordered are listed, but only abnormal results are displayed) Labs  Reviewed  COMPREHENSIVE METABOLIC PANEL - Abnormal; Notable for the following:       Result Value   Sodium 134 (*)    Potassium 3.4 (*)    Chloride 96 (*)    Creatinine, Ser 2.20 (*)    Total Protein 6.1 (*)    Albumin 2.2 (*)    GFR calc non Af Amer 20 (*)    GFR calc Af Amer 23 (*)    All other components within normal limits  TROPONIN I - Abnormal; Notable for the following:    Troponin I 0.33 (*)    All other components within normal limits  CBC WITH DIFFERENTIAL/PLATELET - Abnormal; Notable for the following:    RBC 3.18 (*)    Hemoglobin 9.6 (*)    HCT 28.8 (*)    All other components within normal limits  I-STAT CHEM 8, ED - Abnormal; Notable for the following:    Potassium 3.1 (*)    Glucose, Bld 155 (*)    All other components within normal limits  CULTURE, BLOOD (ROUTINE X 2)  CULTURE, BLOOD (ROUTINE X 2)  AEROBIC CULTURE (SUPERFICIAL SPECIMEN)  I-STAT CG4 LACTIC ACID, ED    EKG  EKG Interpretation  Date/Time:  Saturday August 21 2016 10:28:15 EDT Ventricular Rate:  79 PR Interval:    QRS Duration: 146 QT Interval:  398 QTC Calculation: 457 R Axis:   -25 Text Interpretation:  Sinus rhythm Multiform ventricular premature complexes Left bundle branch block No significant change since last tracing Confirmed by Isla Pence [phone]) on 08/21/2016 10:46:31 AM        Radiology Dg Chest 2 View  Result Date: 08/21/2016 CLINICAL DATA:  Weakness. EXAM: CHEST  2 VIEW COMPARISON:  11/15/2015 FINDINGS: Normal heart size. No pleural effusion or edema. Surgical clips are identified within the left axilla. A stent graft is identified projecting over the superior mediastinum. No airspace opacities identified. IMPRESSION: No active cardiopulmonary disease. Electronically Signed   By: Kerby Moors M.D.   On: 08/21/2016 10:19    Procedures .Marland KitchenIncision and Drainage Date/Time: 08/21/2016 12:32 PM Performed by: Isla Pence Authorized by: Isla Pence   Consent:    Consent obtained:  Verbal   Consent given by:  Patient   Risks discussed:  Bleeding, incomplete drainage and pain   Alternatives discussed:  No treatment Location:    Type:  Abscess   Size:  20 by 20 cm   Location:  Trunk   Trunk location:  Back Pre-procedure details:    Skin preparation:  Betadine Anesthesia (see MAR for exact dosages):    Anesthesia method:  Local infiltration   Local anesthetic:  Lidocaine 2% WITH epi Procedure type:    Complexity:  Simple Procedure details:    Incision types:  Cruciate   Scalpel blade:  11   Wound management:  Probed and deloculated   Drainage:  Purulent   Drainage amount:  Moderate   Wound treatment:  Wound left open   Packing materials:  None Post-procedure details:    Patient tolerance of procedure:  Tolerated well, no immediate complications   (including critical care time)  Medications Ordered in ED Medications  vancomycin (VANCOCIN) IVPB 1000 mg/200 mL premix (1,000 mg Intravenous New Bag/Given 08/21/16 1354)  sodium chloride 0.9 % bolus 1,000 mL (0 mLs Intravenous Stopped 08/21/16 1220)  morphine 2 MG/ML injection 4 mg (4 mg Intravenous Given 08/21/16 1131)  ondansetron (ZOFRAN) injection 4 mg (4 mg Intravenous Given 08/21/16 1100)  lidocaine-EPINEPHrine (XYLOCAINE W/EPI)  2 %-1:200000 (PF) injection 10 mL (10 mLs Infiltration Given  08/21/16 1218)  ceFAZolin (ANCEF) IVPB 1 g/50 mL premix (0 g Intravenous Stopped 08/21/16 1357)     Initial Impression / Assessment and Plan / ED Course  I have reviewed the triage vital signs and the nursing notes.  Pertinent labs & imaging results that were available during my care of the patient were reviewed by me and considered in my medical decision making (see chart for details).   Pt d/w Dr. Maryland Pink (triad) who will admit pt, but at [facility].  He asked me to call general surgery to see pt when she gets there.  Pt d/w Dr. Rosendo Gros (surgery at [facility]) who asks that the nurses call him when pt arrives at [facility].  Final Clinical Impressions(s) / ED Diagnoses   Final diagnoses:  Cellulitis of back except buttock  Back abscess  ESRD on hemodialysis (HCC)  Elevated troponin  Decubitus ulcer of sacral region, stage 2    New Prescriptions New Prescriptions   No medications on file     Isla Pence, MD 08/21/16 1439

## 2016-08-21 NOTE — ED Notes (Signed)
Bed: OI78 Expected date: 08/21/16 Expected time:  Means of arrival:  Comments: Hold-EMS

## 2016-08-22 ENCOUNTER — Inpatient Hospital Stay (HOSPITAL_COMMUNITY): Payer: Medicare Other

## 2016-08-22 DIAGNOSIS — I360 Nonrheumatic tricuspid (valve) stenosis: Secondary | ICD-10-CM

## 2016-08-22 LAB — BASIC METABOLIC PANEL
Anion gap: 7 (ref 5–15)
BUN: 13 mg/dL (ref 6–20)
CHLORIDE: 97 mmol/L — AB (ref 101–111)
CO2: 26 mmol/L (ref 22–32)
CREATININE: 2.76 mg/dL — AB (ref 0.44–1.00)
Calcium: 9 mg/dL (ref 8.9–10.3)
GFR calc non Af Amer: 15 mL/min — ABNORMAL LOW (ref >60)
GFR, EST AFRICAN AMERICAN: 17 mL/min — AB (ref >60)
GLUCOSE: 127 mg/dL — AB (ref 65–99)
Potassium: 3.8 mmol/L (ref 3.5–5.1)
Sodium: 130 mmol/L — ABNORMAL LOW (ref 135–145)

## 2016-08-22 LAB — CBC
HCT: 28.3 % — ABNORMAL LOW (ref 36.0–46.0)
HEMOGLOBIN: 9.2 g/dL — AB (ref 12.0–15.0)
MCH: 30.4 pg (ref 26.0–34.0)
MCHC: 32.5 g/dL (ref 30.0–36.0)
MCV: 93.4 fL (ref 78.0–100.0)
PLATELETS: 185 10*3/uL (ref 150–400)
RBC: 3.03 MIL/uL — ABNORMAL LOW (ref 3.87–5.11)
RDW: 15.1 % (ref 11.5–15.5)
WBC: 6.3 10*3/uL (ref 4.0–10.5)

## 2016-08-22 LAB — ECHOCARDIOGRAM COMPLETE: Weight: 1569.68 oz

## 2016-08-22 LAB — TROPONIN I: TROPONIN I: 0.2 ng/mL — AB (ref <0.03)

## 2016-08-22 MED ORDER — DEXTROSE 5 % IV SOLN
1.0000 g | Freq: Once | INTRAVENOUS | Status: AC
  Administered 2016-08-22: 1 g via INTRAVENOUS
  Filled 2016-08-22 (×2): qty 1

## 2016-08-22 MED ORDER — METOPROLOL SUCCINATE ER 25 MG PO TB24
12.5000 mg | ORAL_TABLET | Freq: Every day | ORAL | Status: DC
  Administered 2016-08-24 – 2016-08-26 (×2): 12.5 mg via ORAL
  Filled 2016-08-22 (×3): qty 1

## 2016-08-22 MED ORDER — PRO-STAT SUGAR FREE PO LIQD
30.0000 mL | Freq: Two times a day (BID) | ORAL | Status: DC
  Administered 2016-08-22 – 2016-08-26 (×9): 30 mL via ORAL
  Filled 2016-08-22 (×8): qty 30

## 2016-08-22 MED ORDER — CEFTAZIDIME 2 G IJ SOLR
2.0000 g | INTRAMUSCULAR | Status: DC
  Filled 2016-08-22: qty 2

## 2016-08-22 MED ORDER — DARBEPOETIN ALFA 40 MCG/0.4ML IJ SOSY
40.0000 ug | PREFILLED_SYRINGE | INTRAMUSCULAR | Status: DC
  Administered 2016-08-23: 40 ug via INTRAVENOUS
  Filled 2016-08-22: qty 0.4

## 2016-08-22 MED ORDER — POTASSIUM CHLORIDE CRYS ER 20 MEQ PO TBCR
40.0000 meq | EXTENDED_RELEASE_TABLET | Freq: Once | ORAL | Status: AC
  Administered 2016-08-22: 40 meq via ORAL
  Filled 2016-08-22: qty 2

## 2016-08-22 NOTE — Plan of Care (Signed)
Problem: Safety: Goal: Ability to remain free from injury will improve Outcome: Progressing Patient has remained free of falls during this admission. Call bell within reach. Bed in low and locked position. Patient alert and oriented. Patient bilateral AKA. Clean and clear environment maintained. 3/4 siderails in place. Patient verbalized understanding of safety instruction. Bed alarm being utilized.  Problem: Education: Goal: Knowledge of disease and its progression will improve Outcome: Progressing Patient has good understanding of disease process and attends dialysis 3 times per week. Graft is positive for bruit and thrill. Patient has no questions at this time.

## 2016-08-22 NOTE — Progress Notes (Signed)
  Echocardiogram 2D Echocardiogram has been performed.  [name] 08/22/2016, 12:24 PM

## 2016-08-22 NOTE — Progress Notes (Signed)
Pharmacy Antibiotic Note  [name] is a 83 y.o. female admitted on 08/21/2016 with cellulitis.  Pharmacy has been consulted for vanc and fortaz dosing. Wound underwent debridement at the bedside 6/16. Of note, patient is ESRD on HD MWF - planning next HD tomorrow on schedule.   Pt is afebrile, WBC wnl, LA 1.42.   Plan: Continue Vancomycin 500 mg qHD MWF with HD Start Ceftazidime 1g q24h today X1, then 2g qMWF @1800  Vanc level PRN F/u c/s data, LOT, clinical picture Follow HD schedule  Weight: 98 lb 1.7 oz (44.5 kg)  Temp (24hrs), Avg:98.2 F (36.8 C), Min:97.5 F (36.4 C), Max:98.8 F (37.1 C)   Recent Labs Lab 08/21/16 1052 08/21/16 1056 08/21/16 1057 08/22/16 0928  WBC 9.2  --   --  6.3  CREATININE 2.20*  --  0.70  --   LATICACIDVEN  --  1.42  --   --     Estimated Creatinine Clearance: 38.7 mL/min (by C-G formula based on SCr of 0.7 mg/dL).    Allergies  Allergen Reactions  . Aspirin Other (See Comments)    NO BLOOD THINNERS OF ANY KIND-bleeding events    Antimicrobials this admission: Vanc 6/16 >>  Ceftaz 6/17 >>   Dose adjustments this admission: none  Microbiology results: 6/16 BCx: ngtd 6/16 wound culture: GPC in pairs, few gram variable rods, pending 6/16 MRSA PCR: negative  Thank you for allowing pharmacy to be a part of this patient's care.  Carlean Jews, Pharm.D. PGY1 Pharmacy Resident 6/17/201812:02 PM Pager [phone]

## 2016-08-22 NOTE — Progress Notes (Signed)
@IPLOG         PROGRESS NOTE                                                                                                                                                                                                             Patient Demographics:    [name], is a 83 y.o. female, DOB - 21-Dec-1932, KPV:[id number]  Admit date - 08/21/2016   Admitting Physician Bonnielee Haff, MD  Outpatient Primary MD for the patient is [name], MD  LOS - 1  Chief Complaint  Patient presents with  . bed sore    left upper back        Brief Narrative  [name] is a 83 y.o. female with a past medical history of end-stage renal disease on hemodialysis, hypertension, breast cancer, history of peripheral vascular disease status post bilateral AKA, who presented with complaints of a sore on her left upper back, was diagnosed with left subscapular skin infection with skin necrosis and admitted to the hospital.    Subjective:    [name] today has, No headache, No chest pain, No abdominal pain - No Nausea, No new weakness tingling or numbness, No Cough - SOB.     Assessment  & Plan :     1.Left subscapular infection requiring incision and debridement by general surgery on 08/21/2016 - postop patient appears to be stable, no signs of sepsis, continue empiric IV antibiotics and follow cultures, continue wound care with wet-to-dry dressing, patient is quite frail, has ESRD, bilateral BKA and lives by herself. We will have PT and social work evaluate.  2. ESRD on MWF schedule, nephrology consulted. Currently compensated.  3. Bilateral AKA, generalized weakness deconditioning. PT eval and possible placement to SNF. She lives alone.  4. History of breast cancer. Continue home regimen with anastrozole. Outpatient follow-up with oncology and PCP.  5. Essential hypertension. Blood pressure slightly soft, reduce home dose beta blocker.  6. Anemia of chronic disease. Stable no  acute issues.  7. PAD with bilateral AKA. Supportive care.    Diet : Diet renal with fluid restriction Fluid restriction: 1200 mL Fluid; Room service appropriate? Yes; Fluid consistency: Thin    Family Communication  :  None  Code Status :  Full  Disposition Plan  :  TBD  Consults  :  CCS, Renal  Procedures  :  Incision and drainage by general surgery on 08/21/2016 of necrotic tissue in the left scapular area.  DVT Prophylaxis  :    Heparin  Lab Results  Component Value Date   PLT 185 08/22/2016    Inpatient Medications  Scheduled Meds: . anastrozole  1 mg Oral Daily  . cinacalcet  60 mg Oral QPC supper  . [START ON 08/23/2016] darbepoetin (ARANESP) injection - DIALYSIS  40 mcg Intravenous Q Mon-HD  . feeding supplement (NEPRO CARB STEADY)  237 mL Oral BID BM  . heparin  5,000 Units Subcutaneous Q8H  . lanthanum  1,000 mg Oral QPC supper  . [START ON 08/23/2016] metoprolol succinate  12.5 mg Oral Daily  . multivitamin  1 tablet Oral QPC supper  . sodium chloride flush  3 mL Intravenous Q12H   Continuous Infusions: . sodium chloride 250 mL (08/21/16 2248)  . [START ON 08/23/2016] vancomycin     PRN Meds:.sodium chloride, acetaminophen **OR** acetaminophen, albuterol, ondansetron **OR** ondansetron (ZOFRAN) IV, oxyCODONE, sodium chloride flush  Antibiotics  :    Anti-infectives    Start     Dose/Rate Route Frequency Ordered Stop   08/23/16 1200  vancomycin (VANCOCIN) 500 mg in sodium chloride 0.9 % 100 mL IVPB     500 mg 100 mL/hr over 60 Minutes Intravenous Every M-W-F (Hemodialysis) 08/21/16 2043     08/21/16 2045  vancomycin (VANCOCIN) IVPB 1000 mg/200 mL premix  Status:  Discontinued     1,000 mg 200 mL/hr over 60 Minutes Intravenous  Once 08/21/16 2043 08/21/16 2354   08/21/16 1230  ceFAZolin (ANCEF) IVPB 1 g/50 mL premix     1 g 100 mL/hr over 30 Minutes Intravenous  Once 08/21/16 1228 08/21/16 1357   08/21/16 1230  vancomycin (VANCOCIN) IVPB 1000 mg/200 mL  premix     1,000 mg 200 mL/hr over 60 Minutes Intravenous  Once 08/21/16 1228 08/21/16 1641         Objective:   Vitals:   08/21/16 2000 08/21/16 2207 08/22/16 0454 08/22/16 0905  BP:  (!) 140/35 (!) 114/31 (!) 100/50  Pulse:  74 71 70  Resp:  18 17 18   Temp:  98.2 F (36.8 C) 97.5 F (36.4 C) 98.1 F (36.7 C)  TempSrc:  Oral Oral Oral  SpO2:  100% 98% 98%  Weight: 44.5 kg (98 lb 3.2 oz) 44.5 kg (98 lb 1.7 oz)      Wt Readings from Last 3 Encounters:  08/21/16 44.5 kg (98 lb 1.7 oz)  04/22/16 53.1 kg (117 lb)  04/20/16 53.3 kg (117 lb 8 oz)     Intake/Output Summary (Last 24 hours) at 08/22/16 1148 Last data filed at 08/22/16 0900  Gross per 24 hour  Intake              220 ml  Output                0 ml  Net              220 ml     Physical Exam  Awake Alert, Oriented X 3, No new F.N deficits, Normal affect [place].AT,PERRAL Supple Neck,No JVD, No cervical lymphadenopathy appriciated.  Symmetrical Chest wall movement, Good air movement bilaterally, CTAB RRR,No Gallops,Rubs or new Murmurs, No Parasternal Heave +ve B.Sounds, Abd Soft, No tenderness, No organomegaly appriciated, No rebound - guarding or rigidity. No Cyanosis, Clubbing or edema, No new Rash or bruise  Bilateral AKA, left scapular incision and drainage site under bandage.    Data Review:    CBC  Recent Labs Lab 08/21/16 1052 08/21/16 1057 08/22/16 0928  WBC 9.2  --  6.3  HGB 9.6* 13.3 9.2*  HCT 28.8* 39.0 28.3*  PLT 184  --  185  MCV 90.6  --  93.4  MCH 30.2  --  30.4  MCHC 33.3  --  32.5  RDW 14.7  --  15.1  LYMPHSABS 1.6  --   --   MONOABS 0.6  --   --   EOSABS 0.0  --   --   BASOSABS 0.0  --   --     Chemistries   Recent Labs Lab 08/21/16 1052 08/21/16 1057  NA 134* 139  K 3.4* 3.1*  CL 96* 102  CO2 31  --   GLUCOSE 81 155*  BUN 11 10  CREATININE 2.20* 0.70  CALCIUM 9.6  --   AST 28  --   ALT 15  --   ALKPHOS 72  --   BILITOT 0.4  --     ------------------------------------------------------------------------------------------------------------------ No results for input(s): CHOL, HDL, LDLCALC, TRIG, CHOLHDL, LDLDIRECT in the last 72 hours.  Lab Results  Component Value Date   HGBA1C 5.2 11/17/2015   ------------------------------------------------------------------------------------------------------------------ No results for input(s): TSH, T4TOTAL, T3FREE, THYROIDAB in the last 72 hours.  Invalid input(s): FREET3 ------------------------------------------------------------------------------------------------------------------ No results for input(s): VITAMINB12, FOLATE, FERRITIN, TIBC, IRON, RETICCTPCT in the last 72 hours.  Coagulation profile No results for input(s): INR, PROTIME in the last 168 hours.  No results for input(s): DDIMER in the last 72 hours.  Cardiac Enzymes  Recent Labs Lab 08/21/16 1052 08/21/16 1915 08/22/16 0036  TROPONINI 0.33* 0.24* 0.20*   ------------------------------------------------------------------------------------------------------------------ No results found for: BNP  Micro Results Recent Results (from the past 240 hour(s))  Culture, blood (routine x 2)     Status: None (Preliminary result)   Collection Time: 08/21/16 10:50 AM  Result Value Ref Range Status   Specimen Description BLOOD RIGHT WRIST  Final   Special Requests   Final    BOTTLES DRAWN AEROBIC AND ANAEROBIC Blood Culture adequate volume   Culture   Final    NO GROWTH < 24 HOURS Performed at [facility], [place] [address], [place], [place] [address]    Report Status PENDING  Incomplete  Wound or Superficial Culture     Status: None (Preliminary result)   Collection Time: 08/21/16 12:31 PM  Result Value Ref Range Status   Specimen Description BACK  Final   Special Requests NONE  Final   Gram Stain   Final    RARE WBC PRESENT, PREDOMINANTLY PMN MODERATE GRAM POSITIVE COCCI IN PAIRS FEW GRAM  VARIABLE ROD    Culture   Final    CULTURE REINCUBATED FOR BETTER GROWTH Performed at [facility], [place] [address], [place], [place] [address]    Report Status PENDING  Incomplete  MRSA PCR Screening     Status: None   Collection Time: 08/21/16  6:38 PM  Result Value Ref Range Status   MRSA by PCR NEGATIVE NEGATIVE Final    Comment:        The GeneXpert MRSA Assay (FDA approved for NASAL specimens only), is one component of a comprehensive MRSA colonization surveillance program. It is not intended to diagnose MRSA infection nor to guide or monitor treatment for MRSA infections.     Radiology Reports Dg Chest 2 View  Result Date: 08/21/2016 CLINICAL DATA:  Weakness. EXAM: CHEST  2 VIEW COMPARISON:  11/15/2015 FINDINGS: Normal heart size. No pleural effusion or edema. Surgical clips are identified within the left axilla. A stent graft is  identified projecting over the superior mediastinum. No airspace opacities identified. IMPRESSION: No active cardiopulmonary disease. Electronically Signed   By: Kerby Moors M.D.   On: 08/21/2016 10:19    Time Spent in minutes  30   Lala Lund M.D on 08/22/2016 at 11:48 AM  Between 7am to 7pm - Pager - [phone] ( page via [email or web address], text pages only, please mention full 10 digit call back number). After 7pm go to [email or web address] - password [email or web address]

## 2016-08-22 NOTE — Consult Note (Signed)
[facility] Renal Consultation Note    Indication for Consultation:  Management of ESRD/hemodialysis; anemia, hypertension/volume and secondary hyperparathyroidism PCP:  HPI: [name] is a 83 y.o. with ESRD on hemodialysis MWF at [facility]. PMH significant  For DM, PVD s/p bilateral AKA, HTN, Hepatitis C, breast cancer.   Patient presented to EDfor evaluation of sore on L upper back. Patient states it started from "where the back of my wheelchair was sticking into me". Reports this has getting progressively worse with foul odor.  Patient also report decubitus ulcer on sacral area. No other complaints. On exam, patient has pressure ulcer L scapula which was debrided at bedside this AM by Dr. Rosendo Gros. Wound still has yellowish green purulent drainage. Also has stage 1 pressure ulcer and 2nd un-stageable pressure ulcer on sacrum. Wound cultures shows GPC in pairs. BC NG 24 hours. She has been started on vancomycin per primary.   Patient lives alone with [facility]. Says she will not go to a nursing home because that's is how she got the "sore on my bottom". Last hemodialysis 08/20/16 stayed full treatment, left 1 kg above EDW. She has not been getting to goal-leaving 0.8-1 kg above EDW. HGB, Ca, phos well controlled. On sensipar for elevated PTH 595.   Past Medical History:  Diagnosis Date  . Anemia   . Arthritis   . Bilateral breast cancer ([place])   . DM (diabetes mellitus) ([place])   . Dyslipidemia   . ESRD (end stage renal disease) on dialysis ([place])    a. [place]: MWF (07/10/2014)  . Hepatitis C   . Hyperparathyroidism   . Hypertension   . PVD (peripheral vascular disease) ([place])    a. s/p peripheral angiogram on 07/10/14 with no PCI and cont med Rx  . Vascular disease    Past Surgical History:  Procedure Laterality Date  . ABDOMINAL HYSTERECTOMY    . AMPUTATION Bilateral 11/21/2015   Procedure: AMPUTATION ABOVE KNEE;  Surgeon: Newt Minion,  MD;  Location: [place];  Service: Orthopedics;  Laterality: Bilateral;  . APPENDECTOMY    . ARTERIOVENOUS GRAFT PLACEMENT Left 03/20/09   "had right arm previously but couldn't use it anymore"  . BACK SURGERY    . BREAST BIOPSY Bilateral   . BREAST BIOPSY Right 08/08/2013   Procedure: Evacuation Hematoma Right Chest;  Surgeon: [name], MD;  Location: [place];  Service: General;  Laterality: Right;  . CATARACT EXTRACTION Right   . DG AV DIALYSIS GRAFT DECLOT OR Left 01/27/11, 02/15/11, 03/15/11, 10/13/11   lua  . EVACUATION BREAST HEMATOMA  2001; 2015   left; right  . INSERTION OF DIALYSIS CATHETER Right 11/15/2015   Procedure: INSERTION OF Right Femoral  DIALYSIS CATHETER.;  Surgeon: Rosetta Posner, MD;  Location: [place];  Service: Vascular;  Laterality: Right;  . MASTECTOMY Left 1970's?  Marland Kitchen MASTECTOMY COMPLETE / SIMPLE Right 08/07/2013  . MEDIAN NERVE REPAIR Left    "decompression"  . PERIPHERAL VASCULAR CATHETERIZATION N/A 07/10/2014   Procedure: Abdominal Aortogram;  Surgeon: Wellington Hampshire, MD;  Location: [place] INVASIVE CV LAB CUPID;  Service: Cardiovascular;  Laterality: N/A;  . POSTERIOR FUSION CERVICAL SPINE     "have 6 screws"  . THROMBECTOMY AND REVISION OF ARTERIOVENTOUS (AV) GORETEX  GRAFT Left 04/18/2009  . THROMBECTOMY AND REVISION OF ARTERIOVENTOUS (AV) GORETEX  GRAFT Left 11/15/2015   Procedure: Thrombectomy and Revision of Left ARm AV Gortex Graft.;  Surgeon: Rosetta Posner,  MD;  Location: [place];  Service: Vascular;  Laterality: Left;  . TOE AMPUTATION Right    1,2nd toes  . TONSILLECTOMY    . TOTAL MASTECTOMY Right 08/07/2013   Procedure: RIGHT TOTAL MASTECTOMY;  Surgeon: [name], MD;  Location: [place];  Service: General;  Laterality: Right;  . UTERINE FIBROID SURGERY     Family History  Problem Relation Age of Onset  . Cancer Mother   . Hypertension Mother    Social History:  reports that she has quit smoking. Her smoking use included Cigarettes. She has never used  smokeless tobacco. She reports that she drinks alcohol. She reports that she does not use drugs. Allergies  Allergen Reactions  . Aspirin Other (See Comments)    NO BLOOD THINNERS OF ANY KIND-bleeding events   Prior to Admission medications   Medication Sig Start Date End Date Taking? Authorizing Provider  anastrozole (ARIMIDEX) 1 MG tablet take 1 tablet by mouth once daily 07/04/16  Yes Shadad, Mathis Dad, MD  B Complex-C-Folic Acid (DIALYVITE TABLET) TABS Take 1 tablet by mouth daily after supper. 10/31/15  Yes [provider]  cinacalcet (SENSIPAR) 60 MG tablet Take 60 mg by mouth daily after supper.   Yes [provider]  lanthanum (FOSRENOL) 1000 MG chewable tablet Chew 1,000 mg by mouth daily after supper.    Yes [provider]  metoprolol succinate (TOPROL XL) 25 MG 24 hr tablet Take 1 tablet (25 mg total) by mouth daily. Patient taking differently: Take 50 mg by mouth daily.  11/24/15  Yes Elgergawy, Silver Huguenin, MD  Nutritional Supplements (FEEDING SUPPLEMENT, NEPRO CARB STEADY,) LIQD Take 237 mLs by mouth 2 (two) times daily between meals. 11/24/15   Elgergawy, Silver Huguenin, MD   Current Facility-Administered Medications  Medication Dose Route Frequency Provider Last Rate Last Dose  . 0.9 %  sodium chloride infusion  250 mL Intravenous PRN Bonnielee Haff, MD 10 mL/hr at 08/21/16 2248 250 mL at 08/21/16 2248  . acetaminophen (TYLENOL) tablet 650 mg  650 mg Oral Q6H PRN Bonnielee Haff, MD       Or  . acetaminophen (TYLENOL) suppository 650 mg  650 mg Rectal Q6H PRN Bonnielee Haff, MD      . albuterol (PROVENTIL) (2.5 MG/3ML) 0.083% nebulizer solution 2.5 mg  2.5 mg Nebulization Q2H PRN Bonnielee Haff, MD      . anastrozole (ARIMIDEX) tablet 1 mg  1 mg Oral Daily Bonnielee Haff, MD   1 mg at 08/21/16 2057  . cinacalcet (SENSIPAR) tablet 60 mg  60 mg Oral QPC supper Bonnielee Haff, MD   60 mg at 08/21/16 2004  . feeding supplement (NEPRO CARB STEADY) liquid 237 mL   237 mL Oral BID BM Bonnielee Haff, MD   237 mL at 08/22/16 1017  . heparin injection 5,000 Units  5,000 Units Subcutaneous Q8H Bonnielee Haff, MD   5,000 Units at 08/22/16 0524  . lanthanum (FOSRENOL) chewable tablet 1,000 mg  1,000 mg Oral QPC supper Bonnielee Haff, MD   1,000 mg at 08/21/16 2004  . metoprolol succinate (TOPROL-XL) 24 hr tablet 25 mg  25 mg Oral Daily Bonnielee Haff, MD   25 mg at 08/22/16 1014  . multivitamin (RENA-VIT) tablet 1 tablet  1 tablet Oral QPC supper Bonnielee Haff, MD   1 tablet at 08/21/16 2003  . ondansetron (ZOFRAN) tablet 4 mg  4 mg Oral Q6H PRN Bonnielee Haff, MD       Or  . ondansetron (  ZOFRAN) injection 4 mg  4 mg Intravenous Q6H PRN Bonnielee Haff, MD      . oxyCODONE (Oxy IR/ROXICODONE) immediate release tablet 5 mg  5 mg Oral Q4H PRN Bonnielee Haff, MD   5 mg at 08/22/16 1017  . sodium chloride flush (NS) 0.9 % injection 3 mL  3 mL Intravenous Q12H Bonnielee Haff, MD   3 mL at 08/22/16 1018  . sodium chloride flush (NS) 0.9 % injection 3 mL  3 mL Intravenous PRN Bonnielee Haff, MD      . Derrill Memo ON 08/23/2016] vancomycin (VANCOCIN) 500 mg in sodium chloride 0.9 % 100 mL IVPB  500 mg Intravenous Q M,W,F-HD Bonnielee Haff, MD       Labs: Basic Metabolic Panel:  Recent Labs Lab 08/21/16 1052 08/21/16 1057  NA 134* 139  K 3.4* 3.1*  CL 96* 102  CO2 31  --   GLUCOSE 81 155*  BUN 11 10  CREATININE 2.20* 0.70  CALCIUM 9.6  --    Liver Function Tests:  Recent Labs Lab 08/21/16 1052  AST 28  ALT 15  ALKPHOS 72  BILITOT 0.4  PROT 6.1*  ALBUMIN 2.2*   No results for input(s): LIPASE, AMYLASE in the last 168 hours. No results for input(s): AMMONIA in the last 168 hours. CBC:  Recent Labs Lab 08/21/16 1052 08/21/16 1057 08/22/16 0928  WBC 9.2  --  6.3  NEUTROABS 7.0  --   --   HGB 9.6* 13.3 9.2*  HCT 28.8* 39.0 28.3*  MCV 90.6  --  93.4  PLT 184  --  185   Cardiac Enzymes:  Recent Labs Lab 08/21/16 1052 08/21/16 1915  08/22/16 0036  TROPONINI 0.33* 0.24* 0.20*   CBG: No results for input(s): GLUCAP in the last 168 hours. Iron Studies: No results for input(s): IRON, TIBC, TRANSFERRIN, FERRITIN in the last 72 hours. Studies/Results: Dg Chest 2 View  Result Date: 08/21/2016 CLINICAL DATA:  Weakness. EXAM: CHEST  2 VIEW COMPARISON:  11/15/2015 FINDINGS: Normal heart size. No pleural effusion or edema. Surgical clips are identified within the left axilla. A stent graft is identified projecting over the superior mediastinum. No airspace opacities identified. IMPRESSION: No active cardiopulmonary disease. Electronically Signed   By: Kerby Moors M.D.   On: 08/21/2016 10:19    ROS: As per HPI otherwise negative.   Physical Exam: Vitals:   08/21/16 2000 08/21/16 2207 08/22/16 0454 08/22/16 0905  BP:  (!) 140/35 (!) 114/31 (!) 100/50  Pulse:  74 71 70  Resp:  18 17 18   Temp:  98.2 F (36.8 C) 97.5 F (36.4 C) 98.1 F (36.7 C)  TempSrc:  Oral Oral Oral  SpO2:  100% 98% 98%  Weight: 44.5 kg (98 lb 3.2 oz) 44.5 kg (98 lb 1.7 oz)       General: Chronically ill appearing female in NAD Head: Normocephalic, atraumatic, sclera non-icteric, mucus membranes are moist Neck: Supple. JVD not elevated. Lungs: Clear bilaterally to auscultation without wheezes, rales, or rhonchi. Breathing is unlabored. Heart: RRR with S1 S2. No murmurs, rubs, or gallops appreciated. Abdomen: Soft, non-tender, non-distended with normoactive bowel sounds. No rebound/guarding. No obvious abdominal masses. Lower extremities: Bilateral AKA without stump edema.  Neuro: Alert and oriented X 3. Moves all extremities spontaneously. Psych:  Responds to questions appropriately with a normal affect. Dialysis Access: LUA AVG + bruit  Dialysis Orders: [place] [facility] 3 hrs 45 min 400/800 41.5 kg 3.0 K/2.5 ca UF profile 4 -Heparin 1700 units  IV TIW -Calcitriol 0.75 mcg PO TIW  BMD Meds: -No binders-last phos 2.9 08/18/16 -Sensipar 60 mg PO q  PM  Assessment/Plan: 1.  Pressure Ulcer L Shoulder/Sacral area: Surgically debrided this AM. Wound care consult for pressure ulcers sacral area. Started on Vanc for [place] in wound.  2.  ESRD - HD tomorrow on schedule. K+3.1 will use 4.0 K bath. Replete K+ give 40 K DUR today. Renal profile with HD tomorrow.  3.  Hypertension/volume  - No evidence of volume overload on exam. CXR unremarkable. Wt. 44.5 kg. Not getting to goal as OP-raise EDW 0.5 kg tomorrow. Continues on metoprolol succ 25 mgt PO daily 4.  Anemia  - HGB 9.2 No OP ESA. Give Aranesp 40 mcg IV with HD tomorrow.  5.  Metabolic bone disease -  Cont VDRA/sensipar.  6.  Nutrition -H/O protein calorie malnutrition-check albumin tomorrow. Liberalize diet to regular/renal vit/nepro 7.  QOL issues: Concerned that patient is living alone and presenting with pressure ulcers, PCM. Will ask MSW if her home situation can be evaluated.   Rita H. Owens Shark, NP-C 08/22/2016, 11:15 AM  [employer] [phone]  Pt seen, examined and agree w A/P as above. ESRD pt with skin abscess prob pressure-related from [facility] or bed.  Bilat amputee. Plan HD tomorrow, will follow.  Kelly Splinter MD [employer] pager [phone]   08/22/2016, 1:11 PM

## 2016-08-23 DIAGNOSIS — L8990 Pressure ulcer of unspecified site, unspecified stage: Secondary | ICD-10-CM | POA: Insufficient documentation

## 2016-08-23 LAB — AEROBIC CULTURE W GRAM STAIN (SUPERFICIAL SPECIMEN)

## 2016-08-23 LAB — AEROBIC CULTURE  (SUPERFICIAL SPECIMEN)

## 2016-08-23 LAB — BLOOD CULTURE ID PANEL (REFLEXED)
ACINETOBACTER BAUMANNII: NOT DETECTED
CANDIDA PARAPSILOSIS: NOT DETECTED
CANDIDA TROPICALIS: NOT DETECTED
CARBAPENEM RESISTANCE: NOT DETECTED
Candida albicans: NOT DETECTED
Candida glabrata: NOT DETECTED
Candida krusei: NOT DETECTED
Enterobacter cloacae complex: NOT DETECTED
Enterobacteriaceae species: DETECTED — AB
Enterococcus species: NOT DETECTED
Escherichia coli: NOT DETECTED
HAEMOPHILUS INFLUENZAE: NOT DETECTED
KLEBSIELLA PNEUMONIAE: NOT DETECTED
Klebsiella oxytoca: NOT DETECTED
Listeria monocytogenes: NOT DETECTED
NEISSERIA MENINGITIDIS: NOT DETECTED
PROTEUS SPECIES: DETECTED — AB
Pseudomonas aeruginosa: NOT DETECTED
SERRATIA MARCESCENS: NOT DETECTED
STAPHYLOCOCCUS AUREUS BCID: NOT DETECTED
STAPHYLOCOCCUS SPECIES: NOT DETECTED
STREPTOCOCCUS SPECIES: NOT DETECTED
Streptococcus agalactiae: NOT DETECTED
Streptococcus pneumoniae: NOT DETECTED
Streptococcus pyogenes: NOT DETECTED

## 2016-08-23 LAB — RENAL FUNCTION PANEL
Albumin: 1.8 g/dL — ABNORMAL LOW (ref 3.5–5.0)
Anion gap: 5 (ref 5–15)
BUN: 7 mg/dL (ref 6–20)
CO2: 28 mmol/L (ref 22–32)
Calcium: 7.9 mg/dL — ABNORMAL LOW (ref 8.9–10.3)
Chloride: 99 mmol/L — ABNORMAL LOW (ref 101–111)
Creatinine, Ser: 1.33 mg/dL — ABNORMAL HIGH (ref 0.44–1.00)
GFR calc Af Amer: 42 mL/min — ABNORMAL LOW (ref >60)
GFR calc non Af Amer: 36 mL/min — ABNORMAL LOW (ref >60)
Glucose, Bld: 110 mg/dL — ABNORMAL HIGH (ref 65–99)
Phosphorus: 1.2 mg/dL — ABNORMAL LOW (ref 2.5–4.6)
Potassium: 2.5 mmol/L — CL (ref 3.5–5.1)
Sodium: 132 mmol/L — ABNORMAL LOW (ref 135–145)

## 2016-08-23 LAB — CBC
HCT: 29.2 % — ABNORMAL LOW (ref 36.0–46.0)
HEMOGLOBIN: 9.7 g/dL — AB (ref 12.0–15.0)
MCH: 30.7 pg (ref 26.0–34.0)
MCHC: 33.2 g/dL (ref 30.0–36.0)
MCV: 92.4 fL (ref 78.0–100.0)
PLATELETS: 184 10*3/uL (ref 150–400)
RBC: 3.16 MIL/uL — AB (ref 3.87–5.11)
RDW: 14.9 % (ref 11.5–15.5)
WBC: 4.7 10*3/uL (ref 4.0–10.5)

## 2016-08-23 LAB — POTASSIUM: Potassium: 3.7 mmol/L (ref 3.5–5.1)

## 2016-08-23 MED ORDER — CEFPODOXIME PROXETIL 200 MG PO TABS
400.0000 mg | ORAL_TABLET | Freq: Once | ORAL | Status: AC
  Administered 2016-08-23: 400 mg via ORAL
  Filled 2016-08-23: qty 2

## 2016-08-23 MED ORDER — DARBEPOETIN ALFA 40 MCG/0.4ML IJ SOSY
PREFILLED_SYRINGE | INTRAMUSCULAR | Status: AC
  Administered 2016-08-23: 40 ug via INTRAVENOUS
  Filled 2016-08-23: qty 0.4

## 2016-08-23 MED ORDER — POTASSIUM CHLORIDE CRYS ER 20 MEQ PO TBCR
40.0000 meq | EXTENDED_RELEASE_TABLET | Freq: Once | ORAL | Status: AC
  Administered 2016-08-23: 40 meq via ORAL
  Filled 2016-08-23: qty 2

## 2016-08-23 MED ORDER — HEPARIN SODIUM (PORCINE) 1000 UNIT/ML DIALYSIS: 1700.0000 [IU] | Freq: Once | INTRAMUSCULAR | Status: DC

## 2016-08-23 MED ORDER — LIDOCAINE HCL (PF) 1 % IJ SOLN: 5.0000 mL | INTRAMUSCULAR | Status: DC | PRN

## 2016-08-23 MED ORDER — LIDOCAINE-PRILOCAINE 2.5-2.5 % EX CREA: 1.0000 "application " | TOPICAL_CREAM | CUTANEOUS | Status: DC | PRN

## 2016-08-23 MED ORDER — POTASSIUM CHLORIDE 10 MEQ/100ML IV SOLN
10.0000 meq | INTRAVENOUS | Status: AC
  Filled 2016-08-23 (×2): qty 100

## 2016-08-23 MED ORDER — COLLAGENASE 250 UNIT/GM EX OINT
TOPICAL_OINTMENT | Freq: Every day | CUTANEOUS | Status: DC
  Administered 2016-08-23 – 2016-08-24 (×2): 1 via TOPICAL
  Administered 2016-08-25 – 2016-08-26 (×2): via TOPICAL
  Filled 2016-08-23: qty 30

## 2016-08-23 MED ORDER — ACETAMINOPHEN 325 MG PO TABS
ORAL_TABLET | ORAL | Status: AC
  Filled 2016-08-23: qty 2

## 2016-08-23 MED ORDER — SODIUM CHLORIDE 0.9 % IV SOLN: 100.0000 mL | INTRAVENOUS | Status: DC | PRN

## 2016-08-23 MED ORDER — CEFPODOXIME PROXETIL 200 MG PO TABS: 400.0000 mg | ORAL_TABLET | ORAL | Status: DC

## 2016-08-23 MED ORDER — VANCOMYCIN HCL IN DEXTROSE 500-5 MG/100ML-% IV SOLN
INTRAVENOUS | Status: AC
  Administered 2016-08-23: 500 mg
  Filled 2016-08-23: qty 100

## 2016-08-23 MED ORDER — PENTAFLUOROPROP-TETRAFLUOROETH EX AERO: 1.0000 "application " | INHALATION_SPRAY | CUTANEOUS | Status: DC | PRN

## 2016-08-23 NOTE — Progress Notes (Signed)
  PHARMACY - PHYSICIAN COMMUNICATION CRITICAL VALUE ALERT - BLOOD CULTURE IDENTIFICATION (BCID)  Results for orders placed or performed during the hospital encounter of 08/21/16  Blood Culture ID Panel (Reflexed) (Collected: 08/21/2016 10:50 AM)  Result Value Ref Range   Enterococcus species NOT DETECTED NOT DETECTED   Listeria monocytogenes NOT DETECTED NOT DETECTED   Staphylococcus species NOT DETECTED NOT DETECTED   Staphylococcus aureus NOT DETECTED NOT DETECTED   Streptococcus species NOT DETECTED NOT DETECTED   Streptococcus agalactiae NOT DETECTED NOT DETECTED   Streptococcus pneumoniae NOT DETECTED NOT DETECTED   Streptococcus pyogenes NOT DETECTED NOT DETECTED   Acinetobacter baumannii NOT DETECTED NOT DETECTED   Enterobacteriaceae species DETECTED (A) NOT DETECTED   Enterobacter cloacae complex NOT DETECTED NOT DETECTED   Escherichia coli NOT DETECTED NOT DETECTED   Klebsiella oxytoca NOT DETECTED NOT DETECTED   Klebsiella pneumoniae NOT DETECTED NOT DETECTED   Proteus species DETECTED (A) NOT DETECTED   Serratia marcescens NOT DETECTED NOT DETECTED   Carbapenem resistance NOT DETECTED NOT DETECTED   Haemophilus influenzae NOT DETECTED NOT DETECTED   Neisseria meningitidis NOT DETECTED NOT DETECTED   Pseudomonas aeruginosa NOT DETECTED NOT DETECTED   Candida albicans NOT DETECTED NOT DETECTED   Candida glabrata NOT DETECTED NOT DETECTED   Candida krusei NOT DETECTED NOT DETECTED   Candida parapsilosis NOT DETECTED NOT DETECTED   Candida tropicalis NOT DETECTED NOT DETECTED    Name of physician (or Provider) Contacted: None  Changes to prescribed antibiotics required: None  Patient currently on cefpodoxime for Proteus and Ecoli in Urine - Sensitivities have previously been resulted.   Afebrile, WBC wnl   Bonnita Nasuti Pharm.D. CPP, BCPS Clinical Pharmacist [phone] 08/23/2016 6:52 PM

## 2016-08-23 NOTE — Progress Notes (Signed)
Pt had a K+=2.5, and is already in 4K bath. Nephrologist ordered 2 runs of 38mEq Potassium IVPB. IV access is leaking and IV team paged to start a new IV access. Pt was put on Tele monitor while on HD tx

## 2016-08-23 NOTE — Consult Note (Addendum)
[facility] Nurse wound consult note Reason for Consult: Consult requested for sacrum, buttocks, and left posterior shoulder. Wound type: Sacrum with healing stage 2 wound 1X1X.1cm, pink and dry, surrounded by pink dry scar tissue Right buttock with stage 2 pressure injury; 3X3X.1cm, pink and moist, bleeds slightly when touched, small amt yellow drainage. Left posterior shoulder with full thickness wound; pt states "a Dr had to drain the wound awhile ago. " 4X3X.3cm, 90% yellow slough, 10% red, mod amt tan drainage with strong foul odor. Pressure Injury POA: Yes Dressing procedure/placement/frequency: Foam dressing to buttocks/sacrum to protect and promote healing.  Santyl for enzymatic debridement of nonviable tissue to left posterior shoulder.  Discussed plan of care with patient and she verbalized understanding. Please re-consult if further assistance is needed.  Thank-you,  Julien Girt MSN, [place], [place], [place], [place]

## 2016-08-23 NOTE — Progress Notes (Signed)
PT Cancellation Note  Patient Details Name: [name] MRN: [id number] DOB: 12/24/1932   Cancelled Treatment:     Pt is currently undergoing HD at this time and unavailable for evaluation. Will return for PT eval tomorrow.  Meda Coffee, SPT [phone]   Eliezer Lofts Norina Cowper 08/23/2016, 1:49 PM

## 2016-08-23 NOTE — Progress Notes (Signed)
[facility] Progress Note   Subjective: "I'm feeling better today". Patient awake, alert, without C/O pain at present.   Objective Vitals:   08/22/16 0454 08/22/16 0905 08/22/16 2252 08/23/16 0541  BP: (!) 114/31 (!) 100/50 (!) 122/21 (!) 133/52  Pulse: 71 70 68 67  Resp: 17 18 18 18   Temp: 97.5 F (36.4 C) 98.1 F (36.7 C) 98.9 F (37.2 C) 97.6 F (36.4 C)  TempSrc: Oral Oral Oral Oral  SpO2: 98% 98% 100% 100%  Weight:       Physical Exam General: Chronically ill appearing very pleasant NAD Heart: S1,S2, RRR Lungs: CTAB A/P  Abdomen: active BS. Non-tender Extremities: Drsg intact L posterior scapula. Bilateral AKA no stump edema.  Dialysis Access: LUA AVG + bruit. Many old AV accesses.   Additional Objective Labs: Basic Metabolic Panel:  Recent Labs Lab 08/21/16 1052 08/21/16 1057 08/22/16 1105  NA 134* 139 130*  K 3.4* 3.1* 3.8  CL 96* 102 97*  CO2 31  --  26  GLUCOSE 81 155* 127*  BUN 11 10 13   CREATININE 2.20* 0.70 2.76*  CALCIUM 9.6  --  9.0   Liver Function Tests:  Recent Labs Lab 08/21/16 1052  AST 28  ALT 15  ALKPHOS 72  BILITOT 0.4  PROT 6.1*  ALBUMIN 2.2*   No results for input(s): LIPASE, AMYLASE in the last 168 hours. CBC:  Recent Labs Lab 08/21/16 1052 08/21/16 1057 08/22/16 0928 08/23/16 0639  WBC 9.2  --  6.3 4.7  NEUTROABS 7.0  --   --   --   HGB 9.6* 13.3 9.2* 9.7*  HCT 28.8* 39.0 28.3* 29.2*  MCV 90.6  --  93.4 92.4  PLT 184  --  185 184   Blood Culture    Component Value Date/Time   SDES BACK 08/21/2016 1231   SPECREQUEST NONE 08/21/2016 1231   CULT  08/21/2016 1231    CULTURE REINCUBATED FOR BETTER GROWTH Performed at [facility] [address] [place], [place] [address]    REPTSTATUS PENDING 08/21/2016 1231    Cardiac Enzymes:  Recent Labs Lab 08/21/16 1052 08/21/16 1915 08/22/16 0036  TROPONINI 0.33* 0.24* 0.20*   CBG: No results for input(s): GLUCAP in the last 168  hours. Iron Studies: No results for input(s): IRON, TIBC, TRANSFERRIN, FERRITIN in the last 72 hours. @lablastinr3 @ Studies/Results: Dg Chest 2 View  Result Date: 08/21/2016 CLINICAL DATA:  Weakness. EXAM: CHEST  2 VIEW COMPARISON:  11/15/2015 FINDINGS: Normal heart size. No pleural effusion or edema. Surgical clips are identified within the left axilla. A stent graft is identified projecting over the superior mediastinum. No airspace opacities identified. IMPRESSION: No active cardiopulmonary disease. Electronically Signed   By: Kerby Moors M.D.   On: 08/21/2016 10:19   Medications: . sodium chloride Stopped (08/22/16 2252)  . cefTAZidime (FORTAZ)  IV    . vancomycin     . anastrozole  1 mg Oral Daily  . cinacalcet  60 mg Oral QPC supper  . darbepoetin (ARANESP) injection - DIALYSIS  40 mcg Intravenous Q Mon-HD  . feeding supplement (NEPRO CARB STEADY)  237 mL Oral BID BM  . feeding supplement (PRO-STAT SUGAR FREE 64)  30 mL Oral BID  . heparin  5,000 Units Subcutaneous Q8H  . lanthanum  1,000 mg Oral QPC supper  . metoprolol succinate  12.5 mg Oral Daily  . multivitamin  1 tablet Oral QPC supper  . sodium chloride flush  3 mL  Intravenous Q12H    Dialysis Orders: [place] M[facility] 3 hrs 45 min 400/800 41.5 kg 3.0 K/2.5 ca UF profile 4 -Heparin 1700 units IV TIW -Calcitriol 0.75 mcg PO TIW  BMD Meds: -No binders-last phos 2.9 08/18/16 -Sensipar 60 mg PO q PM  Assessment/Plan: 1.  Pressure Ulcer L Shoulder/Sacral area: Surgically debrided yesterday per Dr. Emogene Morgan. Wound care consult for pressure ulcers sacral area. Started on emperic Vanc for GPC in wound.  2.  ESRD - HD tomorrow on schedule. K+3.8  3.  Hypertension/volume  - No evidence of volume overload on exam. CXR unremarkable. Wt. 44.5 kg. Not getting to goal as OP-raise EDW 0.5 kg today. Continues on metoprolol succ 25 mgt PO daily 4.  Anemia  - HGB 9.7 No OP ESA. Give Aranesp 40 mcg IV with HD today.  5.  Metabolic bone  disease -  Cont VDRA/sensipar. Renal function panel with HD today 6.  Nutrition -H/O protein calorie malnutrition-check albumin tomorrow. Liberalize diet to regular/renal vit/nepro 7.  QOL issues: Concerned that patient is living alone and presenting with pressure ulcers, PCM. Will ask MSW if her home situation can be evaluated.  8. H/O breast cancer: Cont Anastrozole. Per primary 9. H/O PAD bilateral BKA    Leva Baine H. Falcon Mccaskey NP-C 08/23/2016, 9:43 AM  [employer] [phone]

## 2016-08-23 NOTE — Consult Note (Signed)
      [facility] CM Primary Care Navigator  08/23/2016  [name] [birth year] [id number]    Went to see patient at the bedsideto identify possible discharge needs but RN reports that sheis inhemodialysis at this time.  Will attemptto see patient at another time when she is available in room.  For questions, please contact:  Dannielle Huh, BSN, RN- [facility] Primary Care Navigator  Telephone: [phone] [name]

## 2016-08-23 NOTE — Progress Notes (Signed)
@IPLOG         PROGRESS NOTE                                                                                                                                                                                                             Patient Demographics:    [name], is a 83 y.o. female, DOB - [birth year], OXB:[id number]  Admit date - 08/21/2016   Admitting Physician Heather Haff, MD  Outpatient Primary MD for the patient is [name], MD  LOS - 2  Chief Complaint  Patient presents with  . bed sore    left upper back        Brief Narrative  [name] is a 83 y.o. female with a past medical history of end-stage renal disease on hemodialysis, hypertension, breast cancer, history of peripheral vascular disease status post bilateral AKA, who presented with complaints of a sore on her left upper back, was diagnosed with left subscapular skin infection with skin necrosis and admitted to the hospital.    Subjective:    Patient in bed, appears comfortable, denies any headache, no fever, no chest pain or pressure, no shortness of breath , no abdominal pain. No focal weakness.    Assessment  & Plan :     1.Left subscapular infection requiring incision and debridement by general surgery on 08/21/2016 - postop patient appears to be stable, no signs of sepsis, continue empiric IV antibiotics and follow cultures for another day then will transition to oral Augmentin for maybe 5 days on 08/24/2016, continue wound care with wet-to-dry dressing, patient is quite frail, has ESRD, bilateral BKA and lives by herself. PT and social worker called.  2. ESRD on MWF schedule, nephrology consulted. Currently compensated.  3. Bilateral AKA, generalized weakness deconditioning. PT eval and possible placement to SNF. She lives alone.  4. History of breast cancer. Continue home regimen with anastrozole. Outpatient follow-up with oncology and PCP.  5. Essential hypertension. Blood  pressure slightly soft, reduce home dose beta blocker.  6. Anemia of chronic disease. Stable no acute issues.  7. PAD with bilateral AKA. Supportive care.    Diet : Diet regular Room service appropriate? Yes; Fluid consistency: Thin    Family Communication  :  None  Code Status :  Full  Disposition Plan  :  TBD  Consults  :  CCS, Renal  Procedures  :  Incision and drainage by general surgery on 08/21/2016 of necrotic tissue in the left scapular area.  DVT Prophylaxis  :  Heparin    Lab Results  Component Value Date   PLT 184 08/23/2016    Inpatient Medications  Scheduled Meds: . anastrozole  1 mg Oral Daily  . cinacalcet  60 mg Oral QPC supper  . darbepoetin (ARANESP) injection - DIALYSIS  40 mcg Intravenous Q Mon-HD  . feeding supplement (NEPRO CARB STEADY)  237 mL Oral BID BM  . feeding supplement (PRO-STAT SUGAR FREE 64)  30 mL Oral BID  . heparin  5,000 Units Subcutaneous Q8H  . lanthanum  1,000 mg Oral QPC supper  . metoprolol succinate  12.5 mg Oral Daily  . multivitamin  1 tablet Oral QPC supper  . sodium chloride flush  3 mL Intravenous Q12H   Continuous Infusions: . sodium chloride Stopped (08/22/16 2252)  . cefTAZidime (FORTAZ)  IV    . vancomycin     PRN Meds:.sodium chloride, acetaminophen **OR** acetaminophen, albuterol, ondansetron **OR** ondansetron (ZOFRAN) IV, oxyCODONE, sodium chloride flush  Antibiotics  :    Anti-infectives    Start     Dose/Rate Route Frequency Ordered Stop   08/23/16 1800  cefTAZidime (FORTAZ) 2 g in dextrose 5 % 50 mL IVPB     2 g 100 mL/hr over 30 Minutes Intravenous Every M-W-F 08/22/16 1204     08/23/16 1200  vancomycin (VANCOCIN) 500 mg in sodium chloride 0.9 % 100 mL IVPB     500 mg 100 mL/hr over 60 Minutes Intravenous Every M-W-F (Hemodialysis) 08/21/16 2043     08/22/16 1215  cefTAZidime (FORTAZ) 1 g in dextrose 5 % 50 mL IVPB     1 g 100 mL/hr over 30 Minutes Intravenous  Once 08/22/16 1203 08/22/16 1546    08/21/16 2045  vancomycin (VANCOCIN) IVPB 1000 mg/200 mL premix  Status:  Discontinued     1,000 mg 200 mL/hr over 60 Minutes Intravenous  Once 08/21/16 2043 08/21/16 2354   08/21/16 1230  ceFAZolin (ANCEF) IVPB 1 g/50 mL premix     1 g 100 mL/hr over 30 Minutes Intravenous  Once 08/21/16 1228 08/21/16 1357   08/21/16 1230  vancomycin (VANCOCIN) IVPB 1000 mg/200 mL premix     1,000 mg 200 mL/hr over 60 Minutes Intravenous  Once 08/21/16 1228 08/21/16 1641         Objective:   Vitals:   08/22/16 0454 08/22/16 0905 08/22/16 2252 08/23/16 0541  BP: (!) 114/31 (!) 100/50 (!) 122/21 (!) 133/52  Pulse: 71 70 68 67  Resp: 17 18 18 18   Temp: 97.5 F (36.4 C) 98.1 F (36.7 C) 98.9 F (37.2 C) 97.6 F (36.4 C)  TempSrc: Oral Oral Oral Oral  SpO2: 98% 98% 100% 100%  Weight:        Wt Readings from Last 3 Encounters:  08/21/16 44.5 kg (98 lb 1.7 oz)  04/22/16 53.1 kg (117 lb)  04/20/16 53.3 kg (117 lb 8 oz)     Intake/Output Summary (Last 24 hours) at 08/23/16 1008 Last data filed at 08/23/16 0542  Gross per 24 hour  Intake              390 ml  Output                1 ml  Net              389 ml     Physical Exam  Awake Alert, Oriented X 3, No new F.N deficits, Normal affect [place].AT,PERRAL Supple Neck,No JVD, No cervical lymphadenopathy appriciated.  Symmetrical Chest  wall movement, Good air movement bilaterally, CTAB RRR,No Gallops,Rubs or new Murmurs, No Parasternal Heave +ve B.Sounds, Abd Soft, No tenderness, No organomegaly appriciated, No rebound - guarding or rigidity. No Cyanosis, Clubbing or edema, No new Rash or bruise Bilateral AKA, left scapular incision and drainage site under bandage.    Data Review:    CBC  Recent Labs Lab 08/21/16 1052 08/21/16 1057 08/22/16 0928 08/23/16 0639  WBC 9.2  --  6.3 4.7  HGB 9.6* 13.3 9.2* 9.7*  HCT 28.8* 39.0 28.3* 29.2*  PLT 184  --  185 184  MCV 90.6  --  93.4 92.4  MCH 30.2  --  30.4 30.7  MCHC 33.3  --  32.5  33.2  RDW 14.7  --  15.1 14.9  LYMPHSABS 1.6  --   --   --   MONOABS 0.6  --   --   --   EOSABS 0.0  --   --   --   BASOSABS 0.0  --   --   --     Chemistries   Recent Labs Lab 08/21/16 1052 08/21/16 1057 08/22/16 1105  NA 134* 139 130*  K 3.4* 3.1* 3.8  CL 96* 102 97*  CO2 31  --  26  GLUCOSE 81 155* 127*  BUN 11 10 13   CREATININE 2.20* 0.70 2.76*  CALCIUM 9.6  --  9.0  AST 28  --   --   ALT 15  --   --   ALKPHOS 72  --   --   BILITOT 0.4  --   --    ------------------------------------------------------------------------------------------------------------------ No results for input(s): CHOL, HDL, LDLCALC, TRIG, CHOLHDL, LDLDIRECT in the last 72 hours.  Lab Results  Component Value Date   HGBA1C 5.2 11/17/2015   ------------------------------------------------------------------------------------------------------------------ No results for input(s): TSH, T4TOTAL, T3FREE, THYROIDAB in the last 72 hours.  Invalid input(s): FREET3 ------------------------------------------------------------------------------------------------------------------ No results for input(s): VITAMINB12, FOLATE, FERRITIN, TIBC, IRON, RETICCTPCT in the last 72 hours.  Coagulation profile No results for input(s): INR, PROTIME in the last 168 hours.  No results for input(s): DDIMER in the last 72 hours.  Cardiac Enzymes  Recent Labs Lab 08/21/16 1052 08/21/16 1915 08/22/16 0036  TROPONINI 0.33* 0.24* 0.20*   ------------------------------------------------------------------------------------------------------------------ No results found for: BNP  Micro Results Recent Results (from the past 240 hour(s))  Culture, blood (routine x 2)     Status: None (Preliminary result)   Collection Time: 08/21/16 10:50 AM  Result Value Ref [place] Status   Specimen Description BLOOD RIGHT WRIST  Final   Special Requests   Final    BOTTLES DRAWN AEROBIC AND ANAEROBIC Blood Culture adequate volume    Culture   Final    NO GROWTH < 24 HOURS Performed at [facility], [place] [address] [place], [place] [address]    Report Status PENDING  Incomplete  Wound or Superficial Culture     Status: None (Preliminary result)   Collection Time: 08/21/16 12:31 PM  Result Value Ref [place] Status   Specimen Description BACK  Final   Special Requests NONE  Final   Gram Stain   Final    RARE WBC PRESENT, PREDOMINANTLY PMN MODERATE GRAM POSITIVE COCCI IN PAIRS FEW GRAM VARIABLE ROD    Culture   Final    CULTURE REINCUBATED FOR BETTER GROWTH Performed at [facility], [place] [address] [place], [place] [address]    Report Status PENDING  Incomplete  MRSA PCR Screening  Status: None   Collection Time: 08/21/16  6:38 PM  Result Value Ref [place] Status   MRSA by PCR NEGATIVE NEGATIVE Final    Comment:        The GeneXpert MRSA Assay (FDA approved for NASAL specimens only), is one component of a comprehensive MRSA colonization surveillance program. It is not intended to diagnose MRSA infection nor to guide or monitor treatment for MRSA infections.     Radiology Reports Dg Chest 2 View  Result Date: 08/21/2016 CLINICAL DATA:  Weakness. EXAM: CHEST  2 VIEW COMPARISON:  11/15/2015 FINDINGS: Normal heart size. No pleural effusion or edema. Surgical clips are identified within the left axilla. A stent graft is identified projecting over the superior mediastinum. No airspace opacities identified. IMPRESSION: No active cardiopulmonary disease. Electronically Signed   By: Kerby Moors M.D.   On: 08/21/2016 10:19    Time Spent in minutes  30   Lala Lund M.D on 08/23/2016 at 10:08 AM  Between 7am to 7pm - Pager - [phone] ( page via [email or web address], text pages only, please mention full 10 digit call back number). After 7pm go to [email or web address] - password [facility]

## 2016-08-23 NOTE — Progress Notes (Signed)
IV team cannot start an IV as pt is hard stick. Attending MD ordered 58mEq Potassium oral X1.

## 2016-08-23 NOTE — Plan of Care (Signed)
Problem: Safety: Goal: Ability to remain free from injury will improve Outcome: Progressing No falls during this admission. Call bell within reach. Bed in low and locked position. Patient alert and oriented. Clean and clear environment maintained. 3/4 siderails in place. Patient bilateral AKA. Patient verbalized understanding of safety instruction.  Problem: Education: Goal: Knowledge of disease and its progression will improve Outcome: Progressing Patient set to go to HD today. Patient has no questions concerning HD or CKD. She attends HD 3 times per week at home.

## 2016-08-24 LAB — RENAL FUNCTION PANEL
Albumin: 1.8 g/dL — ABNORMAL LOW (ref 3.5–5.0)
Anion gap: 6 (ref 5–15)
BUN: 12 mg/dL (ref 6–20)
CO2: 27 mmol/L (ref 22–32)
Calcium: 8.5 mg/dL — ABNORMAL LOW (ref 8.9–10.3)
Chloride: 97 mmol/L — ABNORMAL LOW (ref 101–111)
Creatinine, Ser: 2.06 mg/dL — ABNORMAL HIGH (ref 0.44–1.00)
GFR calc Af Amer: 25 mL/min — ABNORMAL LOW (ref >60)
GFR calc non Af Amer: 21 mL/min — ABNORMAL LOW (ref >60)
Glucose, Bld: 85 mg/dL (ref 65–99)
Phosphorus: 1.7 mg/dL — ABNORMAL LOW (ref 2.5–4.6)
Potassium: 4.9 mmol/L (ref 3.5–5.1)
Sodium: 130 mmol/L — ABNORMAL LOW (ref 135–145)

## 2016-08-24 MED ORDER — CEPHALEXIN 500 MG PO CAPS
500.0000 mg | ORAL_CAPSULE | Freq: Two times a day (BID) | ORAL | Status: DC
  Administered 2016-08-24 – 2016-08-26 (×5): 500 mg via ORAL
  Filled 2016-08-24 (×5): qty 1

## 2016-08-24 NOTE — NC FL2 (Signed)
[place] LEVEL OF CARE SCREENING TOOL     IDENTIFICATION  Patient Name: [name] Birthdate: 06/18/1932 Sex: female Admission Date (Current Location): 08/21/2016  [facility] and [place] Number:  Herbalist and Address:  [facility] [place], [place] [address], [place], [place] [address]      Provider Number: [id number]  Attending Physician Name and Address:  Thurnell Lose, MD  Relative Name and Phone Number:       Current Level of Care: Hospital Recommended Level of Care: [place] Prior Approval Number:    Date Approved/Denied:   PASRR Number: [id number]  Discharge Plan: SNF    Current Diagnoses: Patient Active Problem List   Diagnosis Date Noted  . Pressure injury of skin 08/23/2016  . Cellulitis and abscess of trunk 08/21/2016  . S/P AKA (above knee amputation) bilateral ([place]) 04/22/2016  . Protein-calorie malnutrition, severe 11/20/2015  . Osteomyelitis ([place]) 11/17/2015  . Pressure ulcer 11/16/2015  . Absolute anemia 11/16/2015  . Acute post-hemorrhagic anemia 11/15/2015  . Bleeding from dialysis shunt ([place]) 11/15/2015  . Incontinence of feces 11/15/2015  . ESRD (end stage renal disease) on dialysis ([place])   . Hepatitis C   . Hypertension   . Dyslipidemia   . PVD (peripheral vascular disease) ([place]) 07/02/2014  . End stage renal disease ([place]) 08/08/2013  . Postoperative wound hematoma 08/08/2013  . Breast cancer of lower-outer quadrant of right female breast ([place]) 07/19/2013    Orientation RESPIRATION BLADDER Height & Weight     Self, Time, Situation, Place  Normal Continent Weight: 96 lb 9 oz (43.8 kg) Height:  5\' 2"  (157.5 cm)  BEHAVIORAL SYMPTOMS/MOOD NEUROLOGICAL BOWEL NUTRITION STATUS      Continent Diet (fluid restriction, renal)  AMBULATORY STATUS COMMUNICATION OF NEEDS Skin   Extensive Assist Verbally PU Stage and Appropriate Care   PU Stage 2 Dressing:  (sacral wound, foam dressing)                    Personal Care Assistance Level of Assistance  Bathing, Dressing Bathing Assistance: Maximum assistance   Dressing Assistance: Maximum assistance     Functional Limitations Info             SPECIAL CARE FACTORS FREQUENCY  PT (By licensed PT), OT (By licensed OT)     PT Frequency: 5/wk OT Frequency: 5/wk            Contractures      Additional Factors Info  Code Status, Allergies Code Status Info: FULL Allergies Info: Aspirin           Current Medications (08/24/2016):  This is the current hospital active medication list Current Facility-Administered Medications  Medication Dose Route Frequency Provider Last Rate Last Dose  . 0.9 %  sodium chloride infusion  250 mL Intravenous PRN Bonnielee Haff, MD   Stopped at 08/22/16 2252  . acetaminophen (TYLENOL) tablet 650 mg  650 mg Oral Q6H PRN Bonnielee Haff, MD   650 mg at 08/23/16 1421   Or  . acetaminophen (TYLENOL) suppository 650 mg  650 mg Rectal Q6H PRN Bonnielee Haff, MD      . albuterol (PROVENTIL) (2.5 MG/3ML) 0.083% nebulizer solution 2.5 mg  2.5 mg Nebulization Q2H PRN Bonnielee Haff, MD      . anastrozole (ARIMIDEX) tablet 1 mg  1 mg Oral Daily Bonnielee Haff, MD   1 mg at 08/24/16 1007  . cephALEXin (KEFLEX) capsule 500 mg  500 mg Oral Q12H Thurnell Lose, MD      . cinacalcet (SENSIPAR) tablet 60 mg  60 mg Oral QPC supper Bonnielee Haff, MD   60 mg at 08/23/16 1812  . collagenase (SANTYL) ointment   Topical Daily Thurnell Lose, MD   1 application at 78/24/23 1007  . Darbepoetin Alfa (ARANESP) injection 40 mcg  40 mcg Intravenous Q Mon-HD Valentina Gu, NP   40 mcg at 08/23/16 1632  . feeding supplement (NEPRO CARB STEADY) liquid 237 mL  237 mL Oral BID BM Bonnielee Haff, MD   237 mL at 08/24/16 1006  . feeding supplement (PRO-STAT SUGAR FREE 64) liquid 30 mL  30 mL Oral BID Valentina Gu, NP   30 mL at 08/24/16 1006  . heparin injection 5,000 Units  5,000 Units  Subcutaneous Q8H Bonnielee Haff, MD   5,000 Units at 08/23/16 2227  . lanthanum (FOSRENOL) chewable tablet 1,000 mg  1,000 mg Oral QPC supper Bonnielee Haff, MD   1,000 mg at 08/23/16 1812  . metoprolol succinate (TOPROL-XL) 24 hr tablet 12.5 mg  12.5 mg Oral Daily Thurnell Lose, MD   12.5 mg at 08/24/16 1007  . multivitamin (RENA-VIT) tablet 1 tablet  1 tablet Oral QPC supper Bonnielee Haff, MD   1 tablet at 08/23/16 1812  . ondansetron (ZOFRAN) tablet 4 mg  4 mg Oral Q6H PRN Bonnielee Haff, MD       Or  . ondansetron [facility]) injection 4 mg  4 mg Intravenous Q6H PRN Bonnielee Haff, MD      . oxyCODONE (Oxy IR/ROXICODONE) immediate release tablet 5 mg  5 mg Oral Q4H PRN Bonnielee Haff, MD   5 mg at 08/23/16 2215  . sodium chloride flush (NS) 0.9 % injection 3 mL  3 mL Intravenous Q12H Bonnielee Haff, MD   3 mL at 08/24/16 1006  . sodium chloride flush (NS) 0.9 % injection 3 mL  3 mL Intravenous PRN Bonnielee Haff, MD         Discharge Medications: Please see discharge summary for a list of discharge medications.  Relevant Imaging Results:  Relevant Lab Results:   Additional Information SS#: [id number]; on dialysis at [facility] MWF  Arisbeth Purrington, Marianna H, [place]

## 2016-08-24 NOTE — Progress Notes (Addendum)
@IPLOG         PROGRESS NOTE                                                                                                                                                                                                             Patient Demographics:    [name], is a 83 y.o. female, DOB - Jul 22, 1932, PVV:[id number]  Admit date - 08/21/2016   Admitting Physician Bonnielee Haff, MD  Outpatient Primary MD for the patient is [name], MD  LOS - 3  Chief Complaint  Patient presents with  . bed sore    left upper back        Brief Narrative  [name] is a 83 y.o. female with a past medical history of end-stage renal disease on hemodialysis, hypertension, breast cancer, history of peripheral vascular disease status post bilateral AKA, who presented with complaints of a sore on her left upper back, was diagnosed with left subscapular skin infection with skin necrosis and admitted to the hospital.    Subjective:   Patient in bed, appears comfortable, denies any headache, no fever, no chest pain or pressure, no shortness of breath , no abdominal pain. No focal weakness.   Assessment  & Plan :     1.Left subscapular infection with Enterobacter bacteremia requiring incision and debridement by general surgery on 08/21/2016 - postop patient appears to be stable, no signs of sepsis, continue empiric IV antibiotics and follow cultures for another day then will transition to oral Augmentin for maybe 7 More days on 08/24/2016, continue wound care with wet-to-dry dressing, patient is quite frail, has ESRD, bilateral BKA and lives by herself. PT and social worker consulted, 3 diet stay will be over on 08/24/2016, patient now agreeable to SNF, likely discharge to SNF if stable on 08/25/2016.  2. ESRD on MWF schedule, nephrology consulted. Currently compensated.  3. Bilateral AKA, generalized weakness deconditioning. PT eval and possible placement to SNF. She lives  alone.  4. History of breast cancer. Continue home regimen with anastrozole. Outpatient follow-up with oncology and PCP.  5. Essential hypertension. Blood pressure slightly soft, reduce home dose beta blocker.  6. Anemia of chronic disease. Stable no acute issues.  7. PAD with bilateral AKA. Supportive care.    Diet : Diet regular Room service appropriate? Yes; Fluid consistency: Thin    Family Communication  :  None  Code Status :  Full  Disposition Plan  :  TBD  Consults  :  CCS, Renal  Procedures  :  Incision and drainage by general surgery on 08/21/2016 of necrotic tissue in the left scapular area.  DVT Prophylaxis  :    Heparin    Lab Results  Component Value Date   PLT 184 08/23/2016    Inpatient Medications  Scheduled Meds: . anastrozole  1 mg Oral Daily  . [START ON 08/25/2016] cefpodoxime  400 mg Oral Q M,W,F-HD  . cinacalcet  60 mg Oral QPC supper  . collagenase   Topical Daily  . darbepoetin (ARANESP) injection - DIALYSIS  40 mcg Intravenous Q Mon-HD  . feeding supplement (NEPRO CARB STEADY)  237 mL Oral BID BM  . feeding supplement (PRO-STAT SUGAR FREE 64)  30 mL Oral BID  . heparin  5,000 Units Subcutaneous Q8H  . lanthanum  1,000 mg Oral QPC supper  . metoprolol succinate  12.5 mg Oral Daily  . multivitamin  1 tablet Oral QPC supper  . sodium chloride flush  3 mL Intravenous Q12H   Continuous Infusions: . sodium chloride Stopped (08/22/16 2252)   PRN Meds:.sodium chloride, acetaminophen **OR** acetaminophen, albuterol, ondansetron **OR** ondansetron (ZOFRAN) IV, oxyCODONE, sodium chloride flush  Antibiotics  :    Anti-infectives    Start     Dose/Rate Route Frequency Ordered Stop   08/25/16 1800  cefpodoxime (VANTIN) tablet 400 mg     400 mg Oral Every M-W-F (Hemodialysis) 08/23/16 1327 08/30/16 1159   08/23/16 1800  cefTAZidime (FORTAZ) 2 g in dextrose 5 % 50 mL IVPB  Status:  Discontinued     2 g 100 mL/hr over 30 Minutes Intravenous Every  M-W-F 08/22/16 1204 08/23/16 1327   08/23/16 1700  cefpodoxime (VANTIN) tablet 400 mg     400 mg Oral Once 08/23/16 1327 08/23/16 1814   08/23/16 1542  vancomycin (VANCOCIN) 500-5 MG/100ML-% IVPB    Comments:  Ashley Akin   : cabinet override      08/23/16 1542 08/23/16 1629   08/23/16 1200  vancomycin (VANCOCIN) 500 mg in sodium chloride 0.9 % 100 mL IVPB  Status:  Discontinued     500 mg 100 mL/hr over 60 Minutes Intravenous Every M-W-F (Hemodialysis) 08/21/16 2043 08/23/16 1327   08/22/16 1215  cefTAZidime (FORTAZ) 1 g in dextrose 5 % 50 mL IVPB     1 g 100 mL/hr over 30 Minutes Intravenous  Once 08/22/16 1203 08/22/16 1546   08/21/16 2045  vancomycin (VANCOCIN) IVPB 1000 mg/200 mL premix  Status:  Discontinued     1,000 mg 200 mL/hr over 60 Minutes Intravenous  Once 08/21/16 2043 08/21/16 2354   08/21/16 1230  ceFAZolin (ANCEF) IVPB 1 g/50 mL premix     1 g 100 mL/hr over 30 Minutes Intravenous  Once 08/21/16 1228 08/21/16 1357   08/21/16 1230  vancomycin (VANCOCIN) IVPB 1000 mg/200 mL premix     1,000 mg 200 mL/hr over 60 Minutes Intravenous  Once 08/21/16 1228 08/21/16 1641         Objective:   Vitals:   08/23/16 2232 08/23/16 2300 08/24/16 0600 08/24/16 0910  BP: (!) 113/44  (!) 131/41 (!) 129/53  Pulse: 64  73 74  Resp: 16  16 18   Temp: 97.8 F (36.6 C)  98.4 F (36.9 C) 98.1 F (36.7 C)  TempSrc: Oral  Oral Oral  SpO2: 100%  100% 100%  Weight:      Height:  5\' 2"  (1.575 m)      Wt Readings from Last 3 Encounters:  08/23/16 43.8 kg (96 lb 9  oz)  04/22/16 53.1 kg (117 lb)  04/20/16 53.3 kg (117 lb 8 oz)     Intake/Output Summary (Last 24 hours) at 08/24/16 1045 Last data filed at 08/24/16 1006  Gross per 24 hour  Intake              363 ml  Output             1648 ml  Net            -1285 ml     Physical Exam  Awake Alert, Oriented X 3, No new F.N deficits, Normal affect [place].AT,PERRAL Supple Neck,No JVD, No cervical lymphadenopathy appriciated.   Symmetrical Chest wall movement, Good air movement bilaterally, CTAB RRR,No Gallops,Rubs or new Murmurs, No Parasternal Heave +ve B.Sounds, Abd Soft, No tenderness, No organomegaly appriciated, No rebound - guarding or rigidity. No Cyanosis, Clubbing or edema, No new Rash or bruise Bilateral AKA, left scapular incision and drainage site under bandage.    Data Review:    CBC  Recent Labs Lab 08/21/16 1052 08/21/16 1057 08/22/16 0928 08/23/16 0639  WBC 9.2  --  6.3 4.7  HGB 9.6* 13.3 9.2* 9.7*  HCT 28.8* 39.0 28.3* 29.2*  PLT 184  --  185 184  MCV 90.6  --  93.4 92.4  MCH 30.2  --  30.4 30.7  MCHC 33.3  --  32.5 33.2  RDW 14.7  --  15.1 14.9  LYMPHSABS 1.6  --   --   --   MONOABS 0.6  --   --   --   EOSABS 0.0  --   --   --   BASOSABS 0.0  --   --   --     Chemistries   Recent Labs Lab 08/21/16 1052 08/21/16 1057 08/22/16 1105 08/23/16 1511 08/23/16 2124 08/24/16 0607  NA 134* 139 130* 132*  --  130*  K 3.4* 3.1* 3.8 2.5* 3.7 4.9  CL 96* 102 97* 99*  --  97*  CO2 31  --  26 28  --  27  GLUCOSE 81 155* 127* 110*  --  85  BUN 11 10 13 7   --  12  CREATININE 2.20* 0.70 2.76* 1.33*  --  2.06*  CALCIUM 9.6  --  9.0 7.9*  --  8.5*  AST 28  --   --   --   --   --   ALT 15  --   --   --   --   --   ALKPHOS 72  --   --   --   --   --   BILITOT 0.4  --   --   --   --   --    ------------------------------------------------------------------------------------------------------------------ No results for input(s): CHOL, HDL, LDLCALC, TRIG, CHOLHDL, LDLDIRECT in the last 72 hours.  Lab Results  Component Value Date   HGBA1C 5.2 11/17/2015   ------------------------------------------------------------------------------------------------------------------ No results for input(s): TSH, T4TOTAL, T3FREE, THYROIDAB in the last 72 hours.  Invalid input(s):  FREET3 ------------------------------------------------------------------------------------------------------------------ No results for input(s): VITAMINB12, FOLATE, FERRITIN, TIBC, IRON, RETICCTPCT in the last 72 hours.  Coagulation profile No results for input(s): INR, PROTIME in the last 168 hours.  No results for input(s): DDIMER in the last 72 hours.  Cardiac Enzymes  Recent Labs Lab 08/21/16 1052 08/21/16 1915 08/22/16 0036  TROPONINI 0.33* 0.24* 0.20*   ------------------------------------------------------------------------------------------------------------------ No results found for: BNP  Micro Results Recent Results (from the past 240 hour(s))  Culture,  blood (routine x 2)     Status: None (Preliminary result)   Collection Time: 08/21/16 10:50 AM  Result Value Ref Range Status   Specimen Description BLOOD RIGHT WRIST  Final   Special Requests   Final    BOTTLES DRAWN AEROBIC AND ANAEROBIC Blood Culture adequate volume   Culture  Setup Time   Final    GRAM NEGATIVE RODS AEROBIC BOTTLE ONLY CRITICAL RESULT CALLED TO, READ BACK BY AND VERIFIED WITHBronwen Betters PHARMD 1843 08/23/16 A BROWNING Performed at [facility], [place] [address] [place], [place] [address]    Culture GRAM NEGATIVE RODS  Final   Report Status PENDING  Incomplete  Blood Culture ID Panel (Reflexed)     Status: Abnormal   Collection Time: 08/21/16 10:50 AM  Result Value Ref Range Status   Enterococcus species NOT DETECTED NOT DETECTED Final   Listeria monocytogenes NOT DETECTED NOT DETECTED Final   Staphylococcus species NOT DETECTED NOT DETECTED Final   Staphylococcus aureus NOT DETECTED NOT DETECTED Final   Streptococcus species NOT DETECTED NOT DETECTED Final   Streptococcus agalactiae NOT DETECTED NOT DETECTED Final   Streptococcus pneumoniae NOT DETECTED NOT DETECTED Final   Streptococcus pyogenes NOT DETECTED NOT DETECTED Final   Acinetobacter baumannii NOT DETECTED NOT DETECTED  Final   Enterobacteriaceae species DETECTED (A) NOT DETECTED Final    Comment: Enterobacteriaceae represent a large family of gram-negative bacteria, not a single organism. CRITICAL RESULT CALLED TO, READ BACK BY AND VERIFIED WITH: L CURRAN PHARMD 1843 08/23/16 A BROWNING    Enterobacter cloacae complex NOT DETECTED NOT DETECTED Final   Escherichia coli NOT DETECTED NOT DETECTED Final   Klebsiella oxytoca NOT DETECTED NOT DETECTED Final   Klebsiella pneumoniae NOT DETECTED NOT DETECTED Final   Proteus species DETECTED (A) NOT DETECTED Final    Comment: CRITICAL RESULT CALLED TO, READ BACK BY AND VERIFIED WITH: Bronwen Betters PHAMRD 1843 08/23/16 A BROWNING    Serratia marcescens NOT DETECTED NOT DETECTED Final   Carbapenem resistance NOT DETECTED NOT DETECTED Final   Haemophilus influenzae NOT DETECTED NOT DETECTED Final   Neisseria meningitidis NOT DETECTED NOT DETECTED Final   Pseudomonas aeruginosa NOT DETECTED NOT DETECTED Final   Candida albicans NOT DETECTED NOT DETECTED Final   Candida glabrata NOT DETECTED NOT DETECTED Final   Candida krusei NOT DETECTED NOT DETECTED Final   Candida parapsilosis NOT DETECTED NOT DETECTED Final   Candida tropicalis NOT DETECTED NOT DETECTED Final    Comment: Performed at [facility], [place] [address] [place], [place] [address]  Wound or Superficial Culture     Status: None   Collection Time: 08/21/16 12:31 PM  Result Value Ref Range Status   Specimen Description BACK  Final   Special Requests NONE  Final   Gram Stain   Final    RARE WBC PRESENT, PREDOMINANTLY PMN MODERATE GRAM POSITIVE COCCI IN PAIRS FEW GRAM VARIABLE ROD Performed at [facility], [place] [address] [place], [place] [address]    Culture   Final    ABUNDANT PROTEUS MIRABILIS ABUNDANT ESCHERICHIA COLI    Report Status 08/23/2016 FINAL  Final   Organism ID, Bacteria PROTEUS MIRABILIS  Final   Organism ID, Bacteria ESCHERICHIA COLI  Final      Susceptibility    Escherichia coli - MIC*    AMPICILLIN >=32 RESISTANT Resistant     CEFAZOLIN <=4 SENSITIVE Sensitive     CEFEPIME <=1 SENSITIVE Sensitive  CEFTAZIDIME <=1 SENSITIVE Sensitive     CEFTRIAXONE <=1 SENSITIVE Sensitive     CIPROFLOXACIN <=0.25 SENSITIVE Sensitive     GENTAMICIN >=16 RESISTANT Resistant     IMIPENEM <=0.25 SENSITIVE Sensitive     TRIMETH/SULFA >=320 RESISTANT Resistant     AMPICILLIN/SULBACTAM 16 INTERMEDIATE Intermediate     PIP/TAZO <=4 SENSITIVE Sensitive     Extended ESBL NEGATIVE Sensitive     * ABUNDANT ESCHERICHIA COLI   Proteus mirabilis - MIC*    AMPICILLIN <=2 SENSITIVE Sensitive     CEFAZOLIN <=4 SENSITIVE Sensitive     CEFEPIME <=1 SENSITIVE Sensitive     CEFTAZIDIME <=1 SENSITIVE Sensitive     CEFTRIAXONE <=1 SENSITIVE Sensitive     CIPROFLOXACIN <=0.25 SENSITIVE Sensitive     GENTAMICIN <=1 SENSITIVE Sensitive     IMIPENEM 2 SENSITIVE Sensitive     TRIMETH/SULFA <=20 SENSITIVE Sensitive     AMPICILLIN/SULBACTAM <=2 SENSITIVE Sensitive     PIP/TAZO <=4 SENSITIVE Sensitive     * ABUNDANT PROTEUS MIRABILIS  MRSA PCR Screening     Status: None   Collection Time: 08/21/16  6:38 PM  Result Value Ref Range Status   MRSA by PCR NEGATIVE NEGATIVE Final    Comment:        The GeneXpert MRSA Assay (FDA approved for NASAL specimens only), is one component of a comprehensive MRSA colonization surveillance program. It is not intended to diagnose MRSA infection nor to guide or monitor treatment for MRSA infections.     Radiology Reports Dg Chest 2 View  Result Date: 08/21/2016 CLINICAL DATA:  Weakness. EXAM: CHEST  2 VIEW COMPARISON:  11/15/2015 FINDINGS: Normal heart size. No pleural effusion or edema. Surgical clips are identified within the left axilla. A stent graft is identified projecting over the superior mediastinum. No airspace opacities identified. IMPRESSION: No active cardiopulmonary disease. Electronically Signed   By: Kerby Moors M.D.    On: 08/21/2016 10:19    Time Spent in minutes  30   Lala Lund M.D on 08/24/2016 at 10:45 AM  Between 7am to 7pm - Pager - [phone] ( page via [email or web address], text pages only, please mention full 10 digit call back number). After 7pm go to [email or web address] - password [email or web address]

## 2016-08-24 NOTE — Progress Notes (Signed)
[facility] Progress Note   Subjective:   Sitting in chair at bedside. No complaints other than "my rump is sore" Tolerated HD yesterday with no issues   Objective Vitals:   08/23/16 2232 08/23/16 2300 08/24/16 0600 08/24/16 0910  BP: (!) 113/44  (!) 131/41 (!) 129/53  Pulse: 64  73 74  Resp: 16  16 18   Temp: 97.8 F (36.6 C)  98.4 F (36.9 C) 98.1 F (36.7 C)  TempSrc: Oral  Oral Oral  SpO2: 100%  100% 100%  Weight:      Height:  5\' 2"  (1.575 m)     Physical Exam General: Chronically ill appearing very pleasant NAD Heart: S1,S2, RRR Lungs: CTAB A/P  Abdomen: active BS. Non-tender Extremities: Drsg intact L posterior scapula. Bilateral AKA no stump edema.  Dialysis Access: LUA AVG + bruit. Many old AV accesses.   Additional Objective Labs: Basic Metabolic Panel:  Recent Labs Lab 08/22/16 1105 08/23/16 1511 08/23/16 2124 08/24/16 0607  NA 130* 132*  --  130*  K 3.8 2.5* 3.7 4.9  CL 97* 99*  --  97*  CO2 26 28  --  27  GLUCOSE 127* 110*  --  85  BUN 13 7  --  12  CREATININE 2.76* 1.33*  --  2.06*  CALCIUM 9.0 7.9*  --  8.5*  PHOS  --  1.2*  --  1.7*   Liver Function Tests:  Recent Labs Lab 08/21/16 1052 08/23/16 1511 08/24/16 0607  AST 28  --   --   ALT 15  --   --   ALKPHOS 72  --   --   BILITOT 0.4  --   --   PROT 6.1*  --   --   ALBUMIN 2.2* 1.8* 1.8*   No results for input(s): LIPASE, AMYLASE in the last 168 hours. CBC:  Recent Labs Lab 08/21/16 1052 08/21/16 1057 08/22/16 0928 08/23/16 0639  WBC 9.2  --  6.3 4.7  NEUTROABS 7.0  --   --   --   HGB 9.6* 13.3 9.2* 9.7*  HCT 28.8* 39.0 28.3* 29.2*  MCV 90.6  --  93.4 92.4  PLT 184  --  185 184   Blood Culture    Component Value Date/Time   SDES BACK 08/21/2016 1231   SPECREQUEST NONE 08/21/2016 1231   CULT  08/21/2016 1231    ABUNDANT PROTEUS MIRABILIS ABUNDANT ESCHERICHIA COLI    REPTSTATUS 08/23/2016 FINAL 08/21/2016 1231    Cardiac Enzymes:  Recent  Labs Lab 08/21/16 1052 08/21/16 1915 08/22/16 0036  TROPONINI 0.33* 0.24* 0.20*   CBG: No results for input(s): GLUCAP in the last 168 hours. Iron Studies: No results for input(s): IRON, TIBC, TRANSFERRIN, FERRITIN in the last 72 hours. @lablastinr3 @ Studies/Results: No results found. Medications: . sodium chloride Stopped (08/22/16 2252)   . anastrozole  1 mg Oral Daily  . [START ON 08/25/2016] cefpodoxime  400 mg Oral Q M,W,F-HD  . cinacalcet  60 mg Oral QPC supper  . collagenase   Topical Daily  . darbepoetin (ARANESP) injection - DIALYSIS  40 mcg Intravenous Q Mon-HD  . feeding supplement (NEPRO CARB STEADY)  237 mL Oral BID BM  . feeding supplement (PRO-STAT SUGAR FREE 64)  30 mL Oral BID  . heparin  5,000 Units Subcutaneous Q8H  . lanthanum  1,000 mg Oral QPC supper  . metoprolol succinate  12.5 mg Oral Daily  . multivitamin  1 tablet Oral QPC supper  .  sodium chloride flush  3 mL Intravenous Q12H    Dialysis Orders: [facility] M[facility] 3 hrs 45 min 400/800 41.5 kg 3.0 K/2.5 ca UF profile 4 -Heparin 1700 units IV TIW -Calcitriol 0.75 mcg PO TIW  BMD Meds: -No binders-last phos 2.9 08/18/16 -Sensipar 60 mg PO q PM  Assessment/Plan: 1.  Pressure Ulcer L Shoulder/Sacral area: Surgically debrided 6/16 per Dr. Emogene Morgan. Wound care consult for pressure ulcers sacral area. Wound cx+ Proteus mirabilis/E. Coli Blood cx 6/16+ Proteus sp/Entrobacteria - On cefpodoxime Abx per primary  2.  ESRD - HD tomorrow on schedule. K+4.9 3.  Hypertension/volume  - No evidence of volume overload on exam. CXR unremarkable. Wt. 44.5 kg. Not getting to goal as OP- EDW raised 0.5 kg  Continues on metoprolol succ 25 mgt PO daily Post HD wt 6/18 43.8 kg net UF 1.6L  4.  Anemia  - HGB 9.7 No OP ESA. Give Aranesp 40 mcg IV with HD today.  5.  Metabolic bone disease -  P 1.7 Cont VDRA/sensipar.  6.  Nutrition -H/O protein calorie malnutrition- Alb 1.8  Liberalize diet to regular/renal vit/nepro 7.  QOL  issues: Concerned that patient is living alone and presenting with pressure ulcers, PCM. SW asked to evaluate  home situation  8. H/O breast cancer: Cont Anastrozole. Per primary 9. H/O PAD bilateral BKA   Lynnda Child PA-C [facility] Pager [phone] 08/24/2016,10:23 AM

## 2016-08-24 NOTE — Evaluation (Signed)
Physical Therapy Evaluation Patient Details Name: [name] MRN: [id number] DOB: 11-16-1932 Today's Date: 08/24/2016   History of Present Illness  [name] is a 83 y.o. female with a past medical history of end-stage renal disease on hemodialysis, hypertension, breast cancer, history of peripheral vascular disease status post bilateral AKA, who presented with complaints of a sore on her left upper back, was diagnosed with left subscapular skin infection with skin necrosis and admitted to the hospital.  Clinical Impression   Pt admitted with above diagnosis. Pt currently with functional limitations due to the deficits listed below (see PT Problem List). PTA, Ms. Jasko lived alone with family coming over multiple times a day for transfers, meal setup, ADLs; agreeable to short-term SNF (which I heartily agree with), but is adamant about getting back to her home ASAP; I'm concerned that her current home situation isn't sustainable given pressure injuries; would recommend air overlay for bed;  Pt will benefit from skilled PT to increase their independence and safety with mobility to allow discharge to the venue listed below.       Follow Up Recommendations SNF    Equipment Recommendations  Hospital bed;Other (comment) Metallurgist)    Recommendations for Other Services       Precautions / Restrictions Precautions Precautions: Fall Restrictions Weight Bearing Restrictions: No      Mobility  Bed Mobility Overal bed mobility: Needs Assistance Bed Mobility: Supine to Sit     Supine to sit: Total assist     General bed mobility comments: Total assist of 2 for all bed mobiilty performed; Used HOB elevate to come to near sit, then total assist to support trunk while using bed pad to rotate and orient towards chair  Transfers Overall transfer level: Needs assistance Equipment used:  (Bed pad) Transfers: Anterior-Posterior Transfer       Anterior-Posterior transfers: +2  physical assistance;Total assist   General transfer comment: Total assist using bed pad and near lift for ant/Post transfer backwards into chair  Ambulation/Gait                Stairs            Wheelchair Mobility    Modified Rankin (Stroke Patients Only)       Balance Overall balance assessment: Needs assistance Sitting-balance support: Bilateral upper extremity supported Sitting balance-Leahy Scale: Zero Sitting balance - Comments: Near Total assist for balance in unsupported sitting on bed; max assist to pull from supported sitting to unsupported sitting once in recliner                                     Pertinent Vitals/Pain Pain Assessment: 0-10 Pain Score: 8  Pain Location: L scapular wound Pain Descriptors / Indicators: Aching;Constant Pain Intervention(s): Monitored during session    Home Living Family/patient expects to be discharged to:: Skilled nursing facility Living Arrangements: Alone               Additional Comments: Is alone at night; family come to help her daily (multiple times a day) for getting ready for HD, coming back from HD, meals setup, changing adult diaper; does not transfer on and off commode, uses adult diapers    Prior Function Level of Independence: Needs assistance   Gait / Transfers Assistance Needed: Family comes over to help with transfers, has transfer board and mechanical lift; it sounds like lately family has  used the mechanical lift  ADL's / Homemaking Assistance Needed: Family assists with bathing; does not get to [facility], uses adult diapers and then family changes and cleans her when they are over  Comments: Is alone at night; family come to help her daily (multiple times a day) for getting ready for HD, coming back from HD, meals setup, changing adult diaper; does not transfer on and off commode, uses adult diapers     Hand Dominance   Dominant Hand: Right    Extremity/Trunk Assessment    Upper Extremity Assessment Upper Extremity Assessment: Generalized weakness    Lower Extremity Assessment Lower Extremity Assessment:  (Bil AKAs)       Communication   Communication: No difficulties  Cognition Arousal/Alertness: Awake/alert Behavior During Therapy: WFL for tasks assessed/performed Overall Cognitive Status: Within Functional Limits for tasks assessed                                        General Comments      Exercises     Assessment/Plan    PT Assessment Patient needs continued PT services  PT Problem List Decreased strength;Decreased activity tolerance;Decreased balance;Decreased mobility;Decreased safety awareness;Decreased knowledge of precautions;Pain       PT Treatment Interventions Functional mobility training;Therapeutic activities;Therapeutic exercise;Balance training;Neuromuscular re-education;Cognitive remediation;Patient/family education;Wheelchair mobility training    PT Goals (Current goals can be found in the Care Plan section)  Acute Rehab PT Goals Patient Stated Goal: hopes to be in her own home soon PT Goal Formulation: With patient Time For Goal Achievement: 09/07/16 Potential to Achieve Goals: Good    Frequency Min 2X/week   Barriers to discharge Other (comment) [name] adamantly wants to return home after her SNF stay; I'm concerned that her current situation at home is unsustainable, especially given pressure injuries recently; would like to do whatever we can to amp up [place] services after SNF when she returns home; consider Hospital bed with air overaly    Co-evaluation               AM-PAC PT "6 Clicks" Daily Activity  Outcome Measure Difficulty turning over in bed (including adjusting bedclothes, sheets and blankets)?: Total Difficulty moving from lying on back to sitting on the side of the bed? : Total Difficulty sitting down on and standing up from a chair with arms (e.g., wheelchair, bedside  commode, etc,.)?: Total Help needed moving to and from a bed to chair (including a wheelchair)?: Total Help needed walking in hospital room?: Total Help needed climbing 3-5 steps with a railing? : Total 6 Click Score: 6    End of Session Equipment Utilized During Treatment:  (Bed pad) Activity Tolerance: Patient tolerated treatment well Patient left: in chair;with call bell/phone within reach;with chair alarm set;with family/visitor present Nurse Communication: Mobility status;Need for lift equipment;Other (comment) (options to scoot or drawsheet pt back to bed, too) PT Visit Diagnosis: Muscle weakness (generalized) (M62.81);Adult, failure to thrive (R62.7)    Time: 8185-6314 PT Time Calculation (min) (ACUTE ONLY): 19 min   Charges:   PT Evaluation $PT Eval Moderate Complexity: 1 Procedure     PT G Codes:        Roney Marion, PT  Acute Rehabilitation Services Pager [phone] Office [place] 08/24/2016, 10:55 AM

## 2016-08-24 NOTE — Clinical Social Work Note (Signed)
Clinical Social Work Assessment  Patient Details  Name: [name] MRN: [id number] Date of Birth: Jan 07, 1933  Date of referral:  08/24/16               Reason for consult:  Facility Placement                Permission sought to share information with:  Chartered certified accountant granted to share information::  Yes, Verbal Permission Granted  Name::     [name]  Agency::  SNF  Relationship::  niece  Contact Information:     Housing/Transportation Living arrangements for the past 2 months:  [place] of Information:  Patient Patient Interpreter Needed:  None Criminal Activity/Legal Involvement Pertinent to Current Situation/Hospitalization:  No - Comment as needed Significant Relationships:  Other(Comment) Lives with:  Self Do you feel safe going back to the place where you live?  No Need for family participation in patient care:  No (Coment)  Care giving concerns:  Pt lives at home alone- current with big change in mobility due to bilateral BKA and does not have enough physical support to be safe at home.   Social Worker assessment / plan:  CSW spoke with pt concerning PT recommendation for SNF.  Patient has been to SNF in the past and is familiar with referral process and SNF set up.  Employment status:    Insurance information:  Medicare PT Recommendations:  [place] / Referral to community resources:  [place]  Patient/Family's Response to care:  Patient was resistant to SNF at first but is now agreeable to short rehab stay.  Patient/Family's Understanding of and Emotional Response to Diagnosis, Current Treatment, and Prognosis:  Pt appears to be in good spirits despite drastic surgery- patient very adamant about rehab stay being only 3-4 weeks and then plans to return home.  Emotional Assessment Appearance:  Appears stated age Attitude/Demeanor/Rapport:    Affect (typically  observed):  Appropriate, Accepting Orientation:  Oriented to Self, Oriented to Place, Oriented to  Time, Oriented to Situation Alcohol / Substance use:  Not Applicable Psych involvement (Current and /or in the community):  No (Comment)  Discharge Needs  Concerns to be addressed:  Care Coordination Readmission within the last 30 days:  No Current discharge risk:  Physical Impairment Barriers to Discharge:  Continued Medical Work up   Heather Ny, LCSW 08/24/2016, 2:05 PM

## 2016-08-25 DIAGNOSIS — L89152 Pressure ulcer of sacral region, stage 2: Secondary | ICD-10-CM

## 2016-08-25 DIAGNOSIS — L02219 Cutaneous abscess of trunk, unspecified: Secondary | ICD-10-CM

## 2016-08-25 DIAGNOSIS — R748 Abnormal levels of other serum enzymes: Secondary | ICD-10-CM

## 2016-08-25 DIAGNOSIS — L03319 Cellulitis of trunk, unspecified: Secondary | ICD-10-CM

## 2016-08-25 LAB — RENAL FUNCTION PANEL
Albumin: 1.8 g/dL — ABNORMAL LOW (ref 3.5–5.0)
Anion gap: 5 (ref 5–15)
BUN: 19 mg/dL (ref 6–20)
CO2: 25 mmol/L (ref 22–32)
Calcium: 9.3 mg/dL (ref 8.9–10.3)
Chloride: 98 mmol/L — ABNORMAL LOW (ref 101–111)
Creatinine, Ser: 2.97 mg/dL — ABNORMAL HIGH (ref 0.44–1.00)
GFR calc Af Amer: 16 mL/min — ABNORMAL LOW (ref >60)
GFR calc non Af Amer: 14 mL/min — ABNORMAL LOW (ref >60)
Glucose, Bld: 64 mg/dL — ABNORMAL LOW (ref 65–99)
Phosphorus: 2.2 mg/dL — ABNORMAL LOW (ref 2.5–4.6)
Potassium: 4.8 mmol/L (ref 3.5–5.1)
Sodium: 128 mmol/L — ABNORMAL LOW (ref 135–145)

## 2016-08-25 LAB — CBC
HCT: 27.4 % — ABNORMAL LOW (ref 36.0–46.0)
Hemoglobin: 9.1 g/dL — ABNORMAL LOW (ref 12.0–15.0)
MCH: 29.8 pg (ref 26.0–34.0)
MCHC: 33.2 g/dL (ref 30.0–36.0)
MCV: 89.8 fL (ref 78.0–100.0)
Platelets: 212 10*3/uL (ref 150–400)
RBC: 3.05 MIL/uL — ABNORMAL LOW (ref 3.87–5.11)
RDW: 14.7 % (ref 11.5–15.5)
WBC: 5.1 K/uL (ref 4.0–10.5)

## 2016-08-25 LAB — CULTURE, BLOOD (ROUTINE X 2): Special Requests: ADEQUATE

## 2016-08-25 MED ORDER — OXYCODONE HCL 5 MG PO TABS
ORAL_TABLET | ORAL | Status: AC
  Administered 2016-08-25: 5 mg via ORAL
  Filled 2016-08-25: qty 1

## 2016-08-25 MED ORDER — HEPARIN SODIUM (PORCINE) 1000 UNIT/ML DIALYSIS: 1000.0000 [IU] | INTRAMUSCULAR | Status: DC | PRN

## 2016-08-25 MED ORDER — PENTAFLUOROPROP-TETRAFLUOROETH EX AERO: 1.0000 "application " | INHALATION_SPRAY | CUTANEOUS | Status: DC | PRN

## 2016-08-25 MED ORDER — LIDOCAINE HCL (PF) 1 % IJ SOLN: 5.0000 mL | INTRAMUSCULAR | Status: DC | PRN

## 2016-08-25 MED ORDER — HEPARIN SODIUM (PORCINE) 1000 UNIT/ML DIALYSIS: 20.0000 [IU]/kg | INTRAMUSCULAR | Status: DC | PRN

## 2016-08-25 MED ORDER — SODIUM CHLORIDE 0.9 % IV SOLN: 100.0000 mL | INTRAVENOUS | Status: DC | PRN

## 2016-08-25 MED ORDER — LIDOCAINE-PRILOCAINE 2.5-2.5 % EX CREA: 1.0000 "application " | TOPICAL_CREAM | CUTANEOUS | Status: DC | PRN

## 2016-08-25 NOTE — Care Management Important Message (Signed)
Important Message  Patient Details  Name: [name] MRN: [id number] Date of Birth: 04-Jul-1932   Medicare Important Message Given:  Yes    Kalyna Paolella Montine Circle 08/25/2016, 11:19 AM

## 2016-08-25 NOTE — Progress Notes (Signed)
PROGRESS NOTE                                                                                                                                                                                                            Patient Demographics:    [name], is a 83 y.o. female, DOB - 1932-03-12, UQJ:[id number]  Admit date - 08/21/2016   Admitting Physician Bonnielee Haff, MD  Outpatient Primary MD for the patient is [name], MD  LOS - 4  Chief Complaint  Patient presents with  . bed sore    left upper back        Brief Narrative  [name] is a 83 y.o. female with a past medical history of end-stage renal disease on hemodialysis, hypertension, breast cancer, history of peripheral vascular disease status post bilateral AKA, who presented with complaints of a sore on her left upper back, was diagnosed with left subscapular skin infection with skin necrosis and admitted to the hospital.    Subjective:   Patient denies concerns this AM, no chest pain, no dyspnea.    Assessment  & Plan :     1.Left subscapular infection with Enterobacter bacteremia requiring incision and debridement by general surgery on 08/21/2016  - stable post op - has been on IV ABX and now transitioned to Keflex - cont wound care with wet to dry dressing  2. ESRD on MWF schedule - so far tolerating well   3. Bilateral AKA, generalized weakness deconditioning - plan to for SNF when bed available   4. History of breast cancer - Continue home regimen with anastrozole - outpatient follow up    5. Essential hypertension.  - BP on low side of normal but overall stable   6. Anemia of chronic disease.  - no evidence of active bleeding   7. PAD with bilateral AKA.  - Supportive care.   Diet : Diet regular Room service appropriate? Yes; Fluid consistency: Thin    Family Communication  :  Pt at bedside   Code Status :  Full  Disposition Plan  :  SNF when bed available    Consults  :  CCS, Renal  Procedures  :  Incision and drainage by general surgery on 08/21/2016 of necrotic tissue in the left scapular area.  DVT Prophylaxis  :    Heparin    Lab Results  Component Value Date   PLT 212 08/25/2016    Inpatient Medications  Scheduled Meds: . anastrozole  1  mg Oral Daily  . cephALEXin  500 mg Oral Q12H  . cinacalcet  60 mg Oral QPC supper  . collagenase   Topical Daily  . darbepoetin (ARANESP) injection - DIALYSIS  40 mcg Intravenous Q Mon-HD  . feeding supplement (NEPRO CARB STEADY)  237 mL Oral BID BM  . feeding supplement (PRO-STAT SUGAR FREE 64)  30 mL Oral BID  . heparin  5,000 Units Subcutaneous Q8H  . lanthanum  1,000 mg Oral QPC supper  . metoprolol succinate  12.5 mg Oral Daily  . multivitamin  1 tablet Oral QPC supper  . sodium chloride flush  3 mL Intravenous Q12H   Continuous Infusions: . sodium chloride Stopped (08/22/16 2252)  . sodium chloride    . sodium chloride     PRN Meds:.sodium chloride, sodium chloride, sodium chloride, acetaminophen **OR** acetaminophen, albuterol, heparin, heparin, lidocaine (PF), lidocaine-prilocaine, ondansetron **OR** ondansetron (ZOFRAN) IV, oxyCODONE, pentafluoroprop-tetrafluoroeth, sodium chloride flush  Antibiotics  :    Anti-infectives    Start     Dose/Rate Route Frequency Ordered Stop   08/25/16 1800  cefpodoxime (VANTIN) tablet 400 mg  Status:  Discontinued     400 mg Oral Every M-W-F (Hemodialysis) 08/23/16 1327 08/24/16 1104   08/24/16 1115  cephALEXin (KEFLEX) capsule 500 mg     500 mg Oral Every 12 hours 08/24/16 1104 09/03/16 0959   08/23/16 1800  cefTAZidime (FORTAZ) 2 g in dextrose 5 % 50 mL IVPB  Status:  Discontinued     2 g 100 mL/hr over 30 Minutes Intravenous Every M-W-F 08/22/16 1204 08/23/16 1327   08/23/16 1700  cefpodoxime (VANTIN) tablet 400 mg     400 mg Oral Once 08/23/16 1327 08/23/16 1814   08/23/16 1542  vancomycin (VANCOCIN) 500-5 MG/100ML-% IVPB    Comments:   Ashley Akin   : cabinet override      08/23/16 1542 08/23/16 1629   08/23/16 1200  vancomycin (VANCOCIN) 500 mg in sodium chloride 0.9 % 100 mL IVPB  Status:  Discontinued     500 mg 100 mL/hr over 60 Minutes Intravenous Every M-W-F (Hemodialysis) 08/21/16 2043 08/23/16 1327   08/22/16 1215  cefTAZidime (FORTAZ) 1 g in dextrose 5 % 50 mL IVPB     1 g 100 mL/hr over 30 Minutes Intravenous  Once 08/22/16 1203 08/22/16 1546   08/21/16 2045  vancomycin (VANCOCIN) IVPB 1000 mg/200 mL premix  Status:  Discontinued     1,000 mg 200 mL/hr over 60 Minutes Intravenous  Once 08/21/16 2043 08/21/16 2354   08/21/16 1230  ceFAZolin (ANCEF) IVPB 1 g/50 mL premix     1 g 100 mL/hr over 30 Minutes Intravenous  Once 08/21/16 1228 08/21/16 1357   08/21/16 1230  vancomycin (VANCOCIN) IVPB 1000 mg/200 mL premix     1,000 mg 200 mL/hr over 60 Minutes Intravenous  Once 08/21/16 1228 08/21/16 1641         Objective:   Vitals:   08/25/16 1030 08/25/16 1100 08/25/16 1130 08/25/16 1200  BP: (!) 103/44 (!) 96/47 (!) 107/43 (!) 105/41  Pulse: 67 65 61 65  Resp:      Temp:      TempSrc:      SpO2:      Weight:      Height:        Wt Readings from Last 3 Encounters:  08/25/16 46.6 kg (102 lb 11.8 oz)  04/22/16 53.1 kg (117 lb)  04/20/16 53.3 kg (117 lb 8 oz)  Intake/Output Summary (Last 24 hours) at 08/25/16 1214 Last data filed at 08/25/16 0809  Gross per 24 hour  Intake              600 ml  Output                0 ml  Net              600 ml    Physical Exam  Constitutional: Appears well-developed and well-nourished. No distress.  CVS: RRR, S1/S2 +, no murmurs, no gallops, no carotid bruit.  Pulmonary: Effort and breath sounds normal, no stridor, rhonchi, wheezes, rales.  Abdominal: Soft. BS +,  no distension, tenderness, rebound or guarding.  Musculoskeletal: bilateral AKA< left scapular I&D site clear    Data Review:    CBC  Recent Labs Lab 08/21/16 1052 08/21/16 1057  08/22/16 0928 08/23/16 0639 08/25/16 0723  WBC 9.2  --  6.3 4.7 5.1  HGB 9.6* 13.3 9.2* 9.7* 9.1*  HCT 28.8* 39.0 28.3* 29.2* 27.4*  PLT 184  --  185 184 212  MCV 90.6  --  93.4 92.4 89.8  MCH 30.2  --  30.4 30.7 29.8  MCHC 33.3  --  32.5 33.2 33.2  RDW 14.7  --  15.1 14.9 14.7  LYMPHSABS 1.6  --   --   --   --   MONOABS 0.6  --   --   --   --   EOSABS 0.0  --   --   --   --   BASOSABS 0.0  --   --   --   --     Chemistries   Recent Labs Lab 08/21/16 1052 08/21/16 1057 08/22/16 1105 08/23/16 1511 08/23/16 2124 08/24/16 0607 08/25/16 0723  NA 134* 139 130* 132*  --  130* 128*  K 3.4* 3.1* 3.8 2.5* 3.7 4.9 4.8  CL 96* 102 97* 99*  --  97* 98*  CO2 31  --  26 28  --  27 25  GLUCOSE 81 155* 127* 110*  --  85 64*  BUN 11 10 13 7   --  12 19  CREATININE 2.20* 0.70 2.76* 1.33*  --  2.06* 2.97*  CALCIUM 9.6  --  9.0 7.9*  --  8.5* 9.3  AST 28  --   --   --   --   --   --   ALT 15  --   --   --   --   --   --   ALKPHOS 72  --   --   --   --   --   --   BILITOT 0.4  --   --   --   --   --   --    Cardiac Enzymes  Recent Labs Lab 08/21/16 1052 08/21/16 1915 08/22/16 0036  TROPONINI 0.33* 0.24* 0.20*   ------------------------------------------------------------------------------------------------------------------ No results found for: BNP  Micro Results Recent Results (from the past 240 hour(s))  Culture, blood (routine x 2)     Status: Abnormal   Collection Time: 08/21/16 10:50 AM  Result Value Ref Range Status   Specimen Description BLOOD RIGHT WRIST  Final   Special Requests   Final    BOTTLES DRAWN AEROBIC AND ANAEROBIC Blood Culture adequate volume   Culture  Setup Time   Final    GRAM NEGATIVE RODS AEROBIC BOTTLE ONLY CRITICAL RESULT CALLED TO, READ BACK BY AND VERIFIED WITH: L CURRAN PHARMD  1843 08/23/16 A BROWNING Performed at [facility] [address], [place], [place] [address]    Culture PROTEUS MIRABILIS (A)  Final   Report Status  08/25/2016 FINAL  Final   Organism ID, Bacteria PROTEUS MIRABILIS  Final      Susceptibility   Proteus mirabilis - MIC*    AMPICILLIN <=2 SENSITIVE Sensitive     CEFAZOLIN <=4 SENSITIVE Sensitive     CEFEPIME <=1 SENSITIVE Sensitive     CEFTAZIDIME <=1 SENSITIVE Sensitive     CEFTRIAXONE <=1 SENSITIVE Sensitive     CIPROFLOXACIN <=0.25 SENSITIVE Sensitive     GENTAMICIN <=1 SENSITIVE Sensitive     IMIPENEM 2 SENSITIVE Sensitive     TRIMETH/SULFA <=20 SENSITIVE Sensitive     AMPICILLIN/SULBACTAM <=2 SENSITIVE Sensitive     PIP/TAZO <=4 SENSITIVE Sensitive     * PROTEUS MIRABILIS  Blood Culture ID Panel (Reflexed)     Status: Abnormal   Collection Time: 08/21/16 10:50 AM  Result Value Ref Range Status   Enterococcus species NOT DETECTED NOT DETECTED Final   Listeria monocytogenes NOT DETECTED NOT DETECTED Final   Staphylococcus species NOT DETECTED NOT DETECTED Final   Staphylococcus aureus NOT DETECTED NOT DETECTED Final   Streptococcus species NOT DETECTED NOT DETECTED Final   Streptococcus agalactiae NOT DETECTED NOT DETECTED Final   Streptococcus pneumoniae NOT DETECTED NOT DETECTED Final   Streptococcus pyogenes NOT DETECTED NOT DETECTED Final   Acinetobacter baumannii NOT DETECTED NOT DETECTED Final   Enterobacteriaceae species DETECTED (A) NOT DETECTED Final    Comment: Enterobacteriaceae represent a large family of gram-negative bacteria, not a single organism. CRITICAL RESULT CALLED TO, READ BACK BY AND VERIFIED WITH: L CURRAN PHARMD 1843 08/23/16 A BROWNING    Enterobacter cloacae complex NOT DETECTED NOT DETECTED Final   Escherichia coli NOT DETECTED NOT DETECTED Final   Klebsiella oxytoca NOT DETECTED NOT DETECTED Final   Klebsiella pneumoniae NOT DETECTED NOT DETECTED Final   Proteus species DETECTED (A) NOT DETECTED Final    Comment: CRITICAL RESULT CALLED TO, READ BACK BY AND VERIFIED WITH: Bronwen Betters PHAMRD 1843 08/23/16 A BROWNING    Serratia marcescens NOT  DETECTED NOT DETECTED Final   Carbapenem resistance NOT DETECTED NOT DETECTED Final   Haemophilus influenzae NOT DETECTED NOT DETECTED Final   Neisseria meningitidis NOT DETECTED NOT DETECTED Final   Pseudomonas aeruginosa NOT DETECTED NOT DETECTED Final   Candida albicans NOT DETECTED NOT DETECTED Final   Candida glabrata NOT DETECTED NOT DETECTED Final   Candida krusei NOT DETECTED NOT DETECTED Final   Candida parapsilosis NOT DETECTED NOT DETECTED Final   Candida tropicalis NOT DETECTED NOT DETECTED Final    Comment: Performed at [facility], [place] [address], [name], [place] [address]  Wound or Superficial Culture     Status: None   Collection Time: 08/21/16 12:31 PM  Result Value Ref Range Status   Specimen Description BACK  Final   Special Requests NONE  Final   Gram Stain   Final    RARE WBC PRESENT, PREDOMINANTLY PMN MODERATE GRAM POSITIVE COCCI IN PAIRS FEW GRAM VARIABLE ROD Performed at [facility], [place] [address] [place], [place] [address]    Culture   Final    ABUNDANT PROTEUS MIRABILIS ABUNDANT ESCHERICHIA COLI    Report Status 08/23/2016 FINAL  Final   Organism ID, Bacteria PROTEUS MIRABILIS  Final   Organism ID, Bacteria ESCHERICHIA COLI  Final      Susceptibility  Escherichia coli - MIC*    AMPICILLIN >=32 RESISTANT Resistant     CEFAZOLIN <=4 SENSITIVE Sensitive     CEFEPIME <=1 SENSITIVE Sensitive     CEFTAZIDIME <=1 SENSITIVE Sensitive     CEFTRIAXONE <=1 SENSITIVE Sensitive     CIPROFLOXACIN <=0.25 SENSITIVE Sensitive     GENTAMICIN >=16 RESISTANT Resistant     IMIPENEM <=0.25 SENSITIVE Sensitive     TRIMETH/SULFA >=320 RESISTANT Resistant     AMPICILLIN/SULBACTAM 16 INTERMEDIATE Intermediate     PIP/TAZO <=4 SENSITIVE Sensitive     Extended ESBL NEGATIVE Sensitive     * ABUNDANT ESCHERICHIA COLI   Proteus mirabilis - MIC*    AMPICILLIN <=2 SENSITIVE Sensitive     CEFAZOLIN <=4 SENSITIVE Sensitive     CEFEPIME <=1 SENSITIVE Sensitive      CEFTAZIDIME <=1 SENSITIVE Sensitive     CEFTRIAXONE <=1 SENSITIVE Sensitive     CIPROFLOXACIN <=0.25 SENSITIVE Sensitive     GENTAMICIN <=1 SENSITIVE Sensitive     IMIPENEM 2 SENSITIVE Sensitive     TRIMETH/SULFA <=20 SENSITIVE Sensitive     AMPICILLIN/SULBACTAM <=2 SENSITIVE Sensitive     PIP/TAZO <=4 SENSITIVE Sensitive     * ABUNDANT PROTEUS MIRABILIS  MRSA PCR Screening     Status: None   Collection Time: 08/21/16  6:38 PM  Result Value Ref Range Status   MRSA by PCR NEGATIVE NEGATIVE Final    Comment:        The GeneXpert MRSA Assay (FDA approved for NASAL specimens only), is one component of a comprehensive MRSA colonization surveillance program. It is not intended to diagnose MRSA infection nor to guide or monitor treatment for MRSA infections.     Radiology Reports Dg Chest 2 View  Result Date: 08/21/2016 CLINICAL DATA:  Weakness. EXAM: CHEST  2 VIEW COMPARISON:  11/15/2015 FINDINGS: Normal heart size. No pleural effusion or edema. Surgical clips are identified within the left axilla. A stent graft is identified projecting over the superior mediastinum. No airspace opacities identified. IMPRESSION: No active cardiopulmonary disease. Electronically Signed   By: Kerby Moors M.D.   On: 08/21/2016 10:19    Time Spent in minutes  30 minutes   Faye Ramsay, MD  [facility] Pager [phone]  If 7PM-7AM, please contact night-coverage [email or web address] Password [email or web address]

## 2016-08-25 NOTE — Consult Note (Signed)
           [facility] CM Primary Care Navigator  08/25/2016  [name] 1932/06/19 [id number]   Went back to see patient in the roomto identify possible discharge needs but RN reports that sheis inhemodialysis at the moment.  Will try to see patient again at another time when she is available in the  room.   For questions, please contact:  Dannielle Huh, BSN, RN- [facility] Primary Care Navigator  Telephone: [phone] [place]

## 2016-08-25 NOTE — Progress Notes (Signed)
[facility] Progress Note   Subjective:  "I'm going to [facility] for 2 or 3 weeks then I am going home. I feel better but my rump roast (buttocks) is still sore!" On dialysis, tolerating well. Appears to accept going to SNF. No new issues.   Objective Vitals:   08/24/16 1750 08/24/16 2123 08/25/16 0621 08/25/16 0808  BP: (!) 125/47 (!) 135/51 (!) 99/58 (!) 121/41  Pulse: 79 78 72 72  Resp: 18 18 18 18   Temp: 98.5 F (36.9 C) 98.4 F (36.9 C) 98.4 F (36.9 C) 98.2 F (36.8 C)  TempSrc: Oral Oral Oral Oral  SpO2: 100% 99% 100% 98%  Weight:  46.6 kg (102 lb 11.8 oz)    Height:       Physical Exam General:Chronically ill appearing female, NAD Heart:S1,S2, RRR Lungs:CTAB A/P Abdomen: active BS, non-distended Extremities: Bilateral AKA no stump edema Dialysis Access: LUA AVG cannulated at present   Additional Objective Labs: Basic Metabolic Panel:  Recent Labs Lab 08/22/16 1105 08/23/16 1511 08/23/16 2124 08/24/16 0607  NA 130* 132*  --  130*  K 3.8 2.5* 3.7 4.9  CL 97* 99*  --  97*  CO2 26 28  --  27  GLUCOSE 127* 110*  --  85  BUN 13 7  --  12  CREATININE 2.76* 1.33*  --  2.06*  CALCIUM 9.0 7.9*  --  8.5*  PHOS  --  1.2*  --  1.7*   Liver Function Tests:  Recent Labs Lab 08/21/16 1052 08/23/16 1511 08/24/16 0607  AST 28  --   --   ALT 15  --   --   ALKPHOS 72  --   --   BILITOT 0.4  --   --   PROT 6.1*  --   --   ALBUMIN 2.2* 1.8* 1.8*   No results for input(s): LIPASE, AMYLASE in the last 168 hours. CBC:  Recent Labs Lab 08/21/16 1052  08/22/16 0928 08/23/16 0639 08/25/16 0723  WBC 9.2  --  6.3 4.7 5.1  NEUTROABS 7.0  --   --   --   --   HGB 9.6*  < > 9.2* 9.7* 9.1*  HCT 28.8*  < > 28.3* 29.2* 27.4*  MCV 90.6  --  93.4 92.4 89.8  PLT 184  --  185 184 212  < > = values in this interval not displayed. Blood Culture    Component Value Date/Time   SDES BACK 08/21/2016 1231   SPECREQUEST NONE 08/21/2016 1231   CULT   08/21/2016 1231    ABUNDANT PROTEUS MIRABILIS ABUNDANT ESCHERICHIA COLI    REPTSTATUS 08/23/2016 FINAL 08/21/2016 1231    Cardiac Enzymes:  Recent Labs Lab 08/21/16 1052 08/21/16 1915 08/22/16 0036  TROPONINI 0.33* 0.24* 0.20*   CBG: No results for input(s): GLUCAP in the last 168 hours. Iron Studies: No results for input(s): IRON, TIBC, TRANSFERRIN, FERRITIN in the last 72 hours. @lablastinr3 @ Studies/Results: No results found. Medications: . sodium chloride Stopped (08/22/16 2252)  . sodium chloride    . sodium chloride     . anastrozole  1 mg Oral Daily  . cephALEXin  500 mg Oral Q12H  . cinacalcet  60 mg Oral QPC supper  . collagenase   Topical Daily  . darbepoetin (ARANESP) injection - DIALYSIS  40 mcg Intravenous Q Mon-HD  . feeding supplement (NEPRO CARB STEADY)  237 mL Oral BID BM  . feeding supplement (PRO-STAT SUGAR FREE 64)  30 mL  Oral BID  . heparin  5,000 Units Subcutaneous Q8H  . lanthanum  1,000 mg Oral QPC supper  . metoprolol succinate  12.5 mg Oral Daily  . multivitamin  1 tablet Oral QPC supper  . sodium chloride flush  3 mL Intravenous Q12H    Dialysis Orders: [facility] MWF 3 hrs 45 min 400/800 41.5 kg 3.0 K/2.5 ca UF profile 4 -Heparin 1700 units IV TIW -Calcitriol 0.75 mcg PO TIW  BMD Meds: -No binders-last phos 2.9 08/18/16 -Sensipar 60 mg PO q PM  Assessment/Plan: 1. Pressure Ulcer L Shoulder/Sacral area: Surgically debrided 6/16 per Dr. Emogene Morgan. Wound care consult for pressure ulcers sacral area. Wound cx+ Proteus mirabilis/E. Coli Blood cx 6/16+ Proteus sp/Entrobacteria - On cefpodoxime Abx per primary  2. ESRD - HD today on schedule. K+ 4.9 3. Hypertension/volume - On HD today UFG 2.5 liters. BP controlled. Nadir wt achieved in hospital is 43.8 kg which is above OP EDW. Adjust EDW on DC.  4. Anemia - HGB 9.7 No OP ESA. Give Aranesp 40 mcg IV with HD today.  5. Metabolic bone disease - P 1.7 Cont VDRA/sensipar.  6. Nutrition  -H/O protein calorie malnutrition- Alb 1.8  Liberalize diet to regular/renal vit/nepro 7. QOL issues: Concerned that patient is living alone and presenting with pressure ulcers, PCM. SW asked to evaluate  home situation. She will be discharged to [facility] today and seems happy about this.  8. H/O breast cancer: Cont Anastrozole. Per primary 9. H/O PAD bilateral BKA  Arianah Torgeson H. Chistina Roston NP-C 08/25/2016, 8:44 AM  [employer] [phone]

## 2016-08-26 DIAGNOSIS — Z992 Dependence on renal dialysis: Secondary | ICD-10-CM | POA: Diagnosis not present

## 2016-08-26 DIAGNOSIS — Z23 Encounter for immunization: Secondary | ICD-10-CM | POA: Diagnosis not present

## 2016-08-26 DIAGNOSIS — L89159 Pressure ulcer of sacral region, unspecified stage: Secondary | ICD-10-CM | POA: Diagnosis not present

## 2016-08-26 DIAGNOSIS — F0390 Unspecified dementia without behavioral disturbance: Secondary | ICD-10-CM | POA: Diagnosis not present

## 2016-08-26 DIAGNOSIS — L03319 Cellulitis of trunk, unspecified: Secondary | ICD-10-CM | POA: Diagnosis not present

## 2016-08-26 DIAGNOSIS — C50511 Malignant neoplasm of lower-outer quadrant of right female breast: Secondary | ICD-10-CM

## 2016-08-26 DIAGNOSIS — Z89611 Acquired absence of right leg above knee: Secondary | ICD-10-CM | POA: Diagnosis not present

## 2016-08-26 DIAGNOSIS — I739 Peripheral vascular disease, unspecified: Secondary | ICD-10-CM | POA: Diagnosis not present

## 2016-08-26 DIAGNOSIS — B999 Unspecified infectious disease: Secondary | ICD-10-CM | POA: Diagnosis not present

## 2016-08-26 DIAGNOSIS — E1129 Type 2 diabetes mellitus with other diabetic kidney complication: Secondary | ICD-10-CM | POA: Diagnosis not present

## 2016-08-26 DIAGNOSIS — L02219 Cutaneous abscess of trunk, unspecified: Secondary | ICD-10-CM | POA: Diagnosis not present

## 2016-08-26 DIAGNOSIS — R293 Abnormal posture: Secondary | ICD-10-CM | POA: Diagnosis not present

## 2016-08-26 DIAGNOSIS — N186 End stage renal disease: Secondary | ICD-10-CM | POA: Diagnosis not present

## 2016-08-26 DIAGNOSIS — D509 Iron deficiency anemia, unspecified: Secondary | ICD-10-CM | POA: Diagnosis not present

## 2016-08-26 DIAGNOSIS — R278 Other lack of coordination: Secondary | ICD-10-CM | POA: Diagnosis not present

## 2016-08-26 DIAGNOSIS — C50919 Malignant neoplasm of unspecified site of unspecified female breast: Secondary | ICD-10-CM | POA: Diagnosis not present

## 2016-08-26 DIAGNOSIS — E43 Unspecified severe protein-calorie malnutrition: Secondary | ICD-10-CM | POA: Diagnosis not present

## 2016-08-26 DIAGNOSIS — I10 Essential (primary) hypertension: Secondary | ICD-10-CM | POA: Diagnosis not present

## 2016-08-26 DIAGNOSIS — E119 Type 2 diabetes mellitus without complications: Secondary | ICD-10-CM | POA: Diagnosis not present

## 2016-08-26 DIAGNOSIS — D649 Anemia, unspecified: Secondary | ICD-10-CM | POA: Diagnosis not present

## 2016-08-26 DIAGNOSIS — E46 Unspecified protein-calorie malnutrition: Secondary | ICD-10-CM | POA: Diagnosis not present

## 2016-08-26 DIAGNOSIS — Z89612 Acquired absence of left leg above knee: Secondary | ICD-10-CM | POA: Diagnosis not present

## 2016-08-26 DIAGNOSIS — D631 Anemia in chronic kidney disease: Secondary | ICD-10-CM | POA: Diagnosis not present

## 2016-08-26 DIAGNOSIS — N2581 Secondary hyperparathyroidism of renal origin: Secondary | ICD-10-CM | POA: Diagnosis not present

## 2016-08-26 DIAGNOSIS — I120 Hypertensive chronic kidney disease with stage 5 chronic kidney disease or end stage renal disease: Secondary | ICD-10-CM | POA: Diagnosis not present

## 2016-08-26 DIAGNOSIS — L89129 Pressure ulcer of left upper back, unspecified stage: Secondary | ICD-10-CM | POA: Diagnosis not present

## 2016-08-26 DIAGNOSIS — L02212 Cutaneous abscess of back [any part, except buttock]: Principal | ICD-10-CM

## 2016-08-26 DIAGNOSIS — A498 Other bacterial infections of unspecified site: Secondary | ICD-10-CM | POA: Diagnosis not present

## 2016-08-26 DIAGNOSIS — L0390 Cellulitis, unspecified: Secondary | ICD-10-CM | POA: Diagnosis not present

## 2016-08-26 LAB — RENAL FUNCTION PANEL
Albumin: 1.8 g/dL — ABNORMAL LOW (ref 3.5–5.0)
Anion gap: 7 (ref 5–15)
BUN: 14 mg/dL (ref 6–20)
CALCIUM: 8.8 mg/dL — AB (ref 8.9–10.3)
CO2: 28 mmol/L (ref 22–32)
CREATININE: 2.02 mg/dL — AB (ref 0.44–1.00)
Chloride: 97 mmol/L — ABNORMAL LOW (ref 101–111)
GFR calc Af Amer: 25 mL/min — ABNORMAL LOW (ref >60)
GFR, EST NON AFRICAN AMERICAN: 22 mL/min — AB (ref >60)
Glucose, Bld: 85 mg/dL (ref 65–99)
PHOSPHORUS: 2 mg/dL — AB (ref 2.5–4.6)
Potassium: 3.9 mmol/L (ref 3.5–5.1)
SODIUM: 132 mmol/L — AB (ref 135–145)

## 2016-08-26 LAB — CBC
HCT: 28 % — ABNORMAL LOW (ref 36.0–46.0)
Hemoglobin: 9.2 g/dL — ABNORMAL LOW (ref 12.0–15.0)
MCH: 29.9 pg (ref 26.0–34.0)
MCHC: 32.9 g/dL (ref 30.0–36.0)
MCV: 90.9 fL (ref 78.0–100.0)
PLATELETS: 165 10*3/uL (ref 150–400)
RBC: 3.08 MIL/uL — AB (ref 3.87–5.11)
RDW: 14.8 % (ref 11.5–15.5)
WBC: 5.7 10*3/uL (ref 4.0–10.5)

## 2016-08-26 MED ORDER — CINACALCET HCL 30 MG PO TABS: 60.0000 mg | ORAL_TABLET | ORAL | 0 refills | Status: AC

## 2016-08-26 MED ORDER — OXYCODONE HCL 5 MG PO TABS: 5.0000 mg | ORAL_TABLET | ORAL | 0 refills | Status: AC | PRN

## 2016-08-26 MED ORDER — CEPHALEXIN 500 MG PO CAPS: 500.0000 mg | ORAL_CAPSULE | Freq: Two times a day (BID) | ORAL | 0 refills | Status: AC

## 2016-08-26 MED ORDER — METOPROLOL SUCCINATE ER 25 MG PO TB24: 12.5000 mg | ORAL_TABLET | Freq: Every day | ORAL | 0 refills | Status: DC

## 2016-08-26 MED ORDER — PRO-STAT SUGAR FREE PO LIQD: 30.0000 mL | Freq: Two times a day (BID) | ORAL | 0 refills | Status: AC

## 2016-08-26 MED ORDER — CINACALCET HCL 30 MG PO TABS: 60.0000 mg | ORAL_TABLET | ORAL | Status: DC

## 2016-08-26 MED ORDER — CALCITRIOL 0.25 MCG PO CAPS: 0.7500 ug | ORAL_CAPSULE | ORAL | Status: DC

## 2016-08-26 MED ORDER — CALCITRIOL 0.5 MCG PO CAPS: 0.7500 ug | ORAL_CAPSULE | ORAL | Status: DC

## 2016-08-26 MED ORDER — COLLAGENASE 250 UNIT/GM EX OINT: TOPICAL_OINTMENT | Freq: Every day | CUTANEOUS | 0 refills | Status: AC

## 2016-08-26 NOTE — Clinical Social Work Placement (Addendum)
   CLINICAL SOCIAL WORK PLACEMENT  NOTE 08/26/16 - DISCHARGED TO [place] VIA AMBULANCE  Date:  08/26/2016  Patient Details  Name: [name] MRN: [id number] Date of Birth: December 27, 1932  Clinical Social Work is seeking post-discharge placement for this patient at the [place] level of care (*CSW will initial, date and re-position this form in  chart as items are completed):  Yes   Patient/family provided with [place] Work Department's list of facilities offering this level of care within the geographic area requested by the patient (or if unable, by the patient's family).  Yes   Patient/family informed of their freedom to choose among providers that offer the needed level of care, that participate in Medicare, Medicaid or managed care program needed by the patient, have an available bed and are willing to accept the patient.  Yes   Patient/family informed of [facility]'s ownership interest in [facility] and [facility], as well as of the fact that they are under no obligation to receive care at these facilities.  PASRR submitted to EDS on       PASRR number received on       Existing PASRR number confirmed on 08/24/16     FL2 transmitted to all facilities in geographic area requested by pt/family on 08/24/16     FL2 transmitted to all facilities within larger geographic area on       Patient informed that his/her managed care company has contracts with or will negotiate with certain facilities, including the following:        Yes   Patient/family informed of bed offers received.  Patient chooses bed at [facility]     Physician recommends and patient chooses bed at      Patient to be transferred to [facility] on  08/26/16.  Patient to be transferred to facility by ptar     Patient family notified on  08/07/16 of transfer.  Name of family member notified:   [name] - call made  6/21 and message left regarding d/c and ambulance transport (patient's name not used).    PHYSICIAN Please sign FL2     Additional Comment:    _______________________________________________ Sable Feil, LCSW 08/26/2016, 11:07 AM

## 2016-08-26 NOTE — Progress Notes (Signed)
Called report to Butch Penny at [facility]

## 2016-08-26 NOTE — Discharge Summary (Signed)
Physician Discharge Summary  [name] TTS:[id number] DOB: [birth year] DOA: 08/21/2016  PCP: [name], MD  Admit date: 08/21/2016 Discharge date: 08/26/2016  Recommendations for Outpatient Follow-up:  1. Pt will need to follow up with PCP in 1-2 weeks post discharge 2. Pt advised to complete therapy with Keflex for 10 more days post discharge 3. Please also note that dose of Metoprolol was lowered to 12.5 mg PO QD due to lower blood pressure   Discharge Diagnoses:  Principal Problem:   Cellulitis and abscess of trunk Active Problems:   Breast cancer of lower-outer quadrant of right female breast (HCC)   End stage renal disease (HCC)   Protein-calorie malnutrition, severe   S/P AKA (above knee amputation) bilateral (HCC)   Pressure injury of skin  Discharge Condition: Stable  Diet recommendation: Renal diet   History of present illness:  83 y.o.femalewith known ESRD on HD MWF, bilateral AKA, presented with left upper back soreness and was diagnosed with left subscapular skin infection and skin necrosis, admitted for I&D, IV ABX treatment.    Assessment  & Plan :   1.Left subscapular infection with Enterobacter bacteremia requiring incision and debridement by general surgery on 08/21/2016  - has remained stable post op - has been on IV ABX and now transitioned to oral Keflex to complete therapy for 10 more days post discharge  - continue wound care with wet to dry dressing   2. ESRD on MWF schedule - keep on same schedule   3. Bilateral AKA, generalized weakness deconditioning - plan to d/c to SNF today  4. History of breast cancer - Continue home regimen with anastrozole - outpatient follow up    5. Essential hypertension.  - BP has stabilized, now on Metoprolol 12.5 mg PO QD  6. Anemia of chronic disease.  - no signs of active bleeding   7. PAD with bilateral AKA.  - plan d/c SNF    Procedures  :  Incision and drainage by general  surgery on 08/21/2016 of necrotic tissue in the left scapular area.  Procedures/Studies: Dg Chest 2 View  Result Date: 08/21/2016 CLINICAL DATA:  Weakness. EXAM: CHEST  2 VIEW COMPARISON:  11/15/2015 FINDINGS: Normal heart size. No pleural effusion or edema. Surgical clips are identified within the left axilla. A stent graft is identified projecting over the superior mediastinum. No airspace opacities identified. IMPRESSION: No active cardiopulmonary disease. Electronically Signed   By: Kerby Moors M.D.   On: 08/21/2016 10:19     Discharge Exam: Vitals:   08/26/16 0535 08/26/16 0836  BP: (!) 116/43 (!) 132/44  Pulse: 77 75  Resp: 16 16  Temp: 98.1 F (36.7 C) 98.5 F (36.9 C)   Vitals:   08/25/16 1751 08/25/16 2017 08/26/16 0535 08/26/16 0836  BP: (!) 108/33 (!) 96/50 (!) 116/43 (!) 132/44  Pulse: 72 76 77 75  Resp: 16  16 16   Temp: 98.6 F (37 C) 98.6 F (37 C) 98.1 F (36.7 C) 98.5 F (36.9 C)  TempSrc: Oral Oral Oral Oral  SpO2: 100% 100% 100% 100%  Weight:   43 kg (94 lb 12.8 oz)   Height:        General: Pt is alert, follows commands appropriately, not in acute distress Cardiovascular: Regular rate and rhythm, S1/S2 +, no murmurs, no rubs, no gallops Respiratory: Clear to auscultation bilaterally, no wheezing, no crackles, no rhonchi Abdominal: Soft, non tender, non distended, bowel sounds +, no guarding Extremities: bilateral AKA, no stump  edema, LUA AVG + bruit  Discharge Instructions  Discharge Instructions    Diet - low sodium heart healthy    Complete by:  As directed    Increase activity slowly    Complete by:  As directed      Allergies as of 08/26/2016      Reactions   Aspirin Other (See Comments)   NO BLOOD THINNERS OF ANY KIND-bleeding events      Medication List    TAKE these medications   anastrozole 1 MG tablet Commonly known as:  ARIMIDEX take 1 tablet by mouth once daily   calcitRIOL 0.25 MCG capsule Commonly known as:   ROCALTROL Take 3 capsules (0.75 mcg total) by mouth every Monday, Wednesday, and Friday with hemodialysis. Start taking on:  08/27/2016   cephALEXin 500 MG capsule Commonly known as:  KEFLEX Take 1 capsule (500 mg total) by mouth every 12 (twelve) hours.   cinacalcet 30 MG tablet Commonly known as:  SENSIPAR Take 2 tablets (60 mg total) by mouth every Monday, Wednesday, and Friday with hemodialysis. Start taking on:  08/27/2016 What changed:  medication strength  when to take this   collagenase ointment Commonly known as:  SANTYL Apply topically daily.   DIALYVITE TABLET Tabs Take 1 tablet by mouth daily after supper.   feeding supplement (NEPRO CARB STEADY) Liqd Take 237 mLs by mouth 2 (two) times daily between meals.   feeding supplement (PRO-STAT SUGAR FREE 64) Liqd Take 30 mLs by mouth 2 (two) times daily.   lanthanum 1000 MG chewable tablet Commonly known as:  FOSRENOL Chew 1,000 mg by mouth daily after supper.   metoprolol succinate 25 MG 24 hr tablet Commonly known as:  TOPROL XL Take 0.5 tablets (12.5 mg total) by mouth daily. Start taking on:  08/27/2016 What changed:  how much to take   oxyCODONE 5 MG immediate release tablet Commonly known as:  Oxy IR/ROXICODONE Take 1 tablet (5 mg total) by mouth every 4 (four) hours as needed for moderate pain.      Follow-up Information    [name], MD Follow up.   Specialty:  Nephrology Contact information: [facility] [phone]            The results of significant diagnostics from this hospitalization (including imaging, microbiology, ancillary and laboratory) are listed below for reference.     Microbiology: Recent Results (from the past 240 hour(s))  Culture, blood (routine x 2)     Status: Abnormal   Collection Time: 08/21/16 10:50 AM  Result Value Ref Range Status   Specimen Description BLOOD RIGHT WRIST  Final   Special Requests   Final    BOTTLES DRAWN AEROBIC AND ANAEROBIC  Blood Culture adequate volume   Culture  Setup Time   Final    GRAM NEGATIVE RODS AEROBIC BOTTLE ONLY CRITICAL RESULT CALLED TO, READ BACK BY AND VERIFIED WITHBronwen Betters PHARMD 1843 08/23/16 A BROWNING Performed at [facility], [place] [address], [place], [place] [address]    Culture PROTEUS MIRABILIS (A)  Final   Report Status 08/25/2016 FINAL  Final   Organism ID, Bacteria PROTEUS MIRABILIS  Final      Susceptibility   Proteus mirabilis - MIC*    AMPICILLIN <=2 SENSITIVE Sensitive     CEFAZOLIN <=4 SENSITIVE Sensitive     CEFEPIME <=1 SENSITIVE Sensitive     CEFTAZIDIME <=1 SENSITIVE Sensitive     CEFTRIAXONE <=1 SENSITIVE Sensitive     CIPROFLOXACIN <=0.25 SENSITIVE  Sensitive     GENTAMICIN <=1 SENSITIVE Sensitive     IMIPENEM 2 SENSITIVE Sensitive     TRIMETH/SULFA <=20 SENSITIVE Sensitive     AMPICILLIN/SULBACTAM <=2 SENSITIVE Sensitive     PIP/TAZO <=4 SENSITIVE Sensitive     * PROTEUS MIRABILIS  Blood Culture ID Panel (Reflexed)     Status: Abnormal   Collection Time: 08/21/16 10:50 AM  Result Value Ref Range Status   Enterococcus species NOT DETECTED NOT DETECTED Final   Listeria monocytogenes NOT DETECTED NOT DETECTED Final   Staphylococcus species NOT DETECTED NOT DETECTED Final   Staphylococcus aureus NOT DETECTED NOT DETECTED Final   Streptococcus species NOT DETECTED NOT DETECTED Final   Streptococcus agalactiae NOT DETECTED NOT DETECTED Final   Streptococcus pneumoniae NOT DETECTED NOT DETECTED Final   Streptococcus pyogenes NOT DETECTED NOT DETECTED Final   Acinetobacter baumannii NOT DETECTED NOT DETECTED Final   Enterobacteriaceae species DETECTED (A) NOT DETECTED Final    Comment: Enterobacteriaceae represent a large family of gram-negative bacteria, not a single organism. CRITICAL RESULT CALLED TO, READ BACK BY AND VERIFIED WITH: L CURRAN PHARMD 1843 08/23/16 A BROWNING    Enterobacter cloacae complex NOT DETECTED NOT DETECTED Final   Escherichia coli  NOT DETECTED NOT DETECTED Final   Klebsiella oxytoca NOT DETECTED NOT DETECTED Final   Klebsiella pneumoniae NOT DETECTED NOT DETECTED Final   Proteus species DETECTED (A) NOT DETECTED Final    Comment: CRITICAL RESULT CALLED TO, READ BACK BY AND VERIFIED WITH: Bronwen Betters PHAMRD 1843 08/23/16 A BROWNING    Serratia marcescens NOT DETECTED NOT DETECTED Final   Carbapenem resistance NOT DETECTED NOT DETECTED Final   Haemophilus influenzae NOT DETECTED NOT DETECTED Final   Neisseria meningitidis NOT DETECTED NOT DETECTED Final   Pseudomonas aeruginosa NOT DETECTED NOT DETECTED Final   Candida albicans NOT DETECTED NOT DETECTED Final   Candida glabrata NOT DETECTED NOT DETECTED Final   Candida krusei NOT DETECTED NOT DETECTED Final   Candida parapsilosis NOT DETECTED NOT DETECTED Final   Candida tropicalis NOT DETECTED NOT DETECTED Final    Comment: Performed at [facility], [place] [address] [place], [place] [address]  Wound or Superficial Culture     Status: None   Collection Time: 08/21/16 12:31 PM  Result Value Ref Range Status   Specimen Description BACK  Final   Special Requests NONE  Final   Gram Stain   Final    RARE WBC PRESENT, PREDOMINANTLY PMN MODERATE GRAM POSITIVE COCCI IN PAIRS FEW GRAM VARIABLE ROD Performed at [facility], [place] [address] [place], [place] [address]    Culture   Final    ABUNDANT PROTEUS MIRABILIS ABUNDANT ESCHERICHIA COLI    Report Status 08/23/2016 FINAL  Final   Organism ID, Bacteria PROTEUS MIRABILIS  Final   Organism ID, Bacteria ESCHERICHIA COLI  Final      Susceptibility   Escherichia coli - MIC*    AMPICILLIN >=32 RESISTANT Resistant     CEFAZOLIN <=4 SENSITIVE Sensitive     CEFEPIME <=1 SENSITIVE Sensitive     CEFTAZIDIME <=1 SENSITIVE Sensitive     CEFTRIAXONE <=1 SENSITIVE Sensitive     CIPROFLOXACIN <=0.25 SENSITIVE Sensitive     GENTAMICIN >=16 RESISTANT Resistant     IMIPENEM <=0.25 SENSITIVE Sensitive     TRIMETH/SULFA  >=320 RESISTANT Resistant     AMPICILLIN/SULBACTAM 16 INTERMEDIATE Intermediate     PIP/TAZO <=4 SENSITIVE Sensitive     Extended ESBL NEGATIVE Sensitive     *  ABUNDANT ESCHERICHIA COLI   Proteus mirabilis - MIC*    AMPICILLIN <=2 SENSITIVE Sensitive     CEFAZOLIN <=4 SENSITIVE Sensitive     CEFEPIME <=1 SENSITIVE Sensitive     CEFTAZIDIME <=1 SENSITIVE Sensitive     CEFTRIAXONE <=1 SENSITIVE Sensitive     CIPROFLOXACIN <=0.25 SENSITIVE Sensitive     GENTAMICIN <=1 SENSITIVE Sensitive     IMIPENEM 2 SENSITIVE Sensitive     TRIMETH/SULFA <=20 SENSITIVE Sensitive     AMPICILLIN/SULBACTAM <=2 SENSITIVE Sensitive     PIP/TAZO <=4 SENSITIVE Sensitive     * ABUNDANT PROTEUS MIRABILIS  MRSA PCR Screening     Status: None   Collection Time: 08/21/16  6:38 PM  Result Value Ref Range Status   MRSA by PCR NEGATIVE NEGATIVE Final    Comment:        The GeneXpert MRSA Assay (FDA approved for NASAL specimens only), is one component of a comprehensive MRSA colonization surveillance program. It is not intended to diagnose MRSA infection nor to guide or monitor treatment for MRSA infections.      Labs: Basic Metabolic Panel:  Recent Labs Lab 08/22/16 1105 08/23/16 1511 08/23/16 2124 08/24/16 0607 08/25/16 0723 08/26/16 0445  NA 130* 132*  --  130* 128* 132*  K 3.8 2.5* 3.7 4.9 4.8 3.9  CL 97* 99*  --  97* 98* 97*  CO2 26 28  --  27 25 28   GLUCOSE 127* 110*  --  85 64* 85  BUN 13 7  --  12 19 14   CREATININE 2.76* 1.33*  --  2.06* 2.97* 2.02*  CALCIUM 9.0 7.9*  --  8.5* 9.3 8.8*  PHOS  --  1.2*  --  1.7* 2.2* 2.0*   Liver Function Tests:  Recent Labs Lab 08/21/16 1052 08/23/16 1511 08/24/16 0607 08/25/16 0723 08/26/16 0445  AST 28  --   --   --   --   ALT 15  --   --   --   --   ALKPHOS 72  --   --   --   --   BILITOT 0.4  --   --   --   --   PROT 6.1*  --   --   --   --   ALBUMIN 2.2* 1.8* 1.8* 1.8* 1.8*   CBC:  Recent Labs Lab 08/21/16 1052 08/21/16 1057  08/22/16 0928 08/23/16 0639 08/25/16 0723 08/26/16 0445  WBC 9.2  --  6.3 4.7 5.1 5.7  NEUTROABS 7.0  --   --   --   --   --   HGB 9.6* 13.3 9.2* 9.7* 9.1* 9.2*  HCT 28.8* 39.0 28.3* 29.2* 27.4* 28.0*  MCV 90.6  --  93.4 92.4 89.8 90.9  PLT 184  --  185 184 212 165   Cardiac Enzymes:  Recent Labs Lab 08/21/16 1052 08/21/16 1915 08/22/16 0036  TROPONINI 0.33* 0.24* 0.20*   SIGNED: Time coordinating discharge: 45 minutes  Faye Ramsay, MD  [facility] 08/26/2016, 10:48 AM Pager [phone]  If 7PM-7AM, please contact night-coverage [email or web address] Password [email or web address]

## 2016-08-26 NOTE — Progress Notes (Signed)
Pt left floor via stretcher by PTAR.

## 2016-08-26 NOTE — Consult Note (Signed)
           [facility] CM Primary Care Navigator  08/26/2016  [name] 1932/06/27 [id number]   Went back to see patient again to identify possible discharge needs but staff reports that she was already discharged.  Patient was discharged to [facility] skilled nursing facility for short term rehabilitation (patient very adamant of staying at rehabilitation facility only for 3-4 weeks and then plans to return home). Inpatient social worker's note states that patient lives at home alone and does not have enough physical support to be safe at home.  Notified Burgess Amor, RN [facility] post acute RN coordinator) to follow-up needs.  For questions, please contact:  Dannielle Huh, BSN, RN- [facility] Primary Care Navigator  Telephone: [phone] [place]

## 2016-08-26 NOTE — Progress Notes (Signed)
[facility] Progress Note   Subjective: Attempting to eat breakfast, lying flat. Does not call for help. Repositioned pt to avoid aspiration. No C/IOs.   Objective Vitals:   08/25/16 1751 08/25/16 2017 08/26/16 0535 08/26/16 0836  BP: (!) 108/33 (!) 96/50 (!) 116/43 (!) 132/44  Pulse: 72 76 77 75  Resp: 16  16 16   Temp: 98.6 F (37 C) 98.6 F (37 C) 98.1 F (36.7 C) 98.5 F (36.9 C)  TempSrc: Oral Oral Oral Oral  SpO2: 100% 100% 100% 100%  Weight:   43 kg (94 lb 12.8 oz)   Height:       Physical Exam General: Pleasant, elderly female in NAD Heart: S1,S2, RRR Lungs: CTAB A/P Abdomen: Active BS, non-tender Extremities: Bilateral AKA. No stump edema Dialysis Access: LUA AVG + bruit  Additional Objective Labs: Basic Metabolic Panel:  Recent Labs Lab 08/24/16 0607 08/25/16 0723 08/26/16 0445  NA 130* 128* 132*  K 4.9 4.8 3.9  CL 97* 98* 97*  CO2 27 25 28   GLUCOSE 85 64* 85  BUN 12 19 14   CREATININE 2.06* 2.97* 2.02*  CALCIUM 8.5* 9.3 8.8*  PHOS 1.7* 2.2* 2.0*   Liver Function Tests:  Recent Labs Lab 08/21/16 1052  08/24/16 0607 08/25/16 0723 08/26/16 0445  AST 28  --   --   --   --   ALT 15  --   --   --   --   ALKPHOS 72  --   --   --   --   BILITOT 0.4  --   --   --   --   PROT 6.1*  --   --   --   --   ALBUMIN 2.2*  < > 1.8* 1.8* 1.8*  < > = values in this interval not displayed. No results for input(s): LIPASE, AMYLASE in the last 168 hours. CBC:  Recent Labs Lab 08/21/16 1052  08/22/16 0928 08/23/16 0639 08/25/16 0723 08/26/16 0445  WBC 9.2  --  6.3 4.7 5.1 5.7  NEUTROABS 7.0  --   --   --   --   --   HGB 9.6*  < > 9.2* 9.7* 9.1* 9.2*  HCT 28.8*  < > 28.3* 29.2* 27.4* 28.0*  MCV 90.6  --  93.4 92.4 89.8 90.9  PLT 184  --  185 184 212 165  < > = values in this interval not displayed. Blood Culture    Component Value Date/Time   SDES BACK 08/21/2016 1231   SPECREQUEST NONE 08/21/2016 1231   CULT  08/21/2016 1231   ABUNDANT PROTEUS MIRABILIS ABUNDANT ESCHERICHIA COLI    REPTSTATUS 08/23/2016 FINAL 08/21/2016 1231    Cardiac Enzymes:  Recent Labs Lab 08/21/16 1052 08/21/16 1915 08/22/16 0036  TROPONINI 0.33* 0.24* 0.20*   CBG: No results for input(s): GLUCAP in the last 168 hours. Iron Studies: No results for input(s): IRON, TIBC, TRANSFERRIN, FERRITIN in the last 72 hours. @lablastinr3 @ Studies/Results: No results found. Medications: . sodium chloride Stopped (08/22/16 2252)   . anastrozole  1 mg Oral Daily  . cephALEXin  500 mg Oral Q12H  . cinacalcet  60 mg Oral QPC supper  . collagenase   Topical Daily  . darbepoetin (ARANESP) injection - DIALYSIS  40 mcg Intravenous Q Mon-HD  . feeding supplement (NEPRO CARB STEADY)  237 mL Oral BID BM  . feeding supplement (PRO-STAT SUGAR FREE 64)  30 mL Oral BID  . heparin  5,000 Units  Subcutaneous Q8H  . lanthanum  1,000 mg Oral QPC supper  . metoprolol succinate  12.5 mg Oral Daily  . multivitamin  1 tablet Oral QPC supper  . sodium chloride flush  3 mL Intravenous Q12H     Dialysis Orders: [facility] MWF 3 hrs 45 min 400/800 41.5 kg 3.0 K/2.5 ca UF profile 4 -Heparin 1700 units IV TIW -Calcitriol 0.75 mcg PO TIW  BMD Meds: -No binders-last phos 2.9 08/18/16 -Sensipar 60 mg PO q PM  Assessment/Plan: 1. Pressure Ulcer L Shoulder/Sacral area: Surgically debrided 6/16per Dr. Emogene Morgan. Wound care consult for pressure ulcers sacral area. Wound cx+ Proteus mirabilis/E. Coli Blood cx 6/16+ Proteus sp/Entrobacteria. On oral keflex per primary.  2. ESRD -MWF. Next HD tomorrow on schedule.  3. Hypertension/volume - BP controlled. HD 08/25/16.Pre wt 45.6  Nef UF 1648 Post wt 43.8. Will need to raise OP EDW-has been eating better, may have actually gained wt. Make EDW 43 kg.  4. Anemia - HGB 9.2 Aranesp 40 mcg IV 08/23/16. Follow HGB.  5. Metabolic bone disease - Ca 8.8 C Ca 10.5 Use 2.0 Ca bath.  Cont VDRA/sensipar.  6. Nutrition -H/O  protein calorie malnutrition- Alb 1.8 Liberalize diet to regular/renal vit/nepro.  7. QOL issues: Concerned that patient is living alone and presenting with pressure ulcers, PCM. SW asked to evaluate home situation. She will be discharged to [facility] when bed is available.  8. H/O breast cancer: Cont Anastrozole. Per primary H/O PAD bilateral BKA   Nykira Reddix H. Lachelle Rissler NP-C 08/26/2016, 8:58 AM  [employer] [phone]

## 2016-08-26 NOTE — Discharge Instructions (Signed)
Incision and Drainage, Care After  Refer to this sheet in the next few weeks. These instructions provide you with information about caring for yourself after your procedure. Your health care provider may also give you more specific instructions. Your treatment has been planned according to current medical practices, but problems sometimes occur. Call your health care provider if you have any problems or questions after your procedure.  What can I expect after the procedure?  After the procedure, it is common to have:  · Pain or discomfort around your incision site.  · Drainage from your incision.     Follow these instructions at home:  ·   · Take over-the-counter and prescription medicines only as told by your health care provider.  · If you were prescribed an antibiotic medicine, take it as told by your health care provider. Do not stop taking the antibiotic even if you start to feel better.  · Follow instructions from your health care provider about:  ? How to take care of your incision.  ? When and how you should change your packing and bandage (dressing). Wash your hands with soap and water before you change your dressing. If soap and water are not available, use hand sanitizer.  ? When you should remove your dressing.  · Do not take baths, swim, or use a hot tub until your health care provider approves.  · Keep all follow-up visits as told by your health care provider. This is important.  · Check your incision area every day for signs of infection. Check for:  ? More redness, swelling, or pain.  ? More fluid or blood.  ? Warmth.  ? Pus or a bad smell.  Contact a health care provider if:  · Your cyst or abscess returns.  · You have a fever.  · You have more redness, swelling, or pain around your incision.  · You have more fluid or blood coming from your incision.  · Your incision feels warm to the touch.  · You have pus or a bad smell coming from your incision.  Get help right away if:  · You have severe pain or  bleeding.  · You cannot eat or drink without vomiting.  · You have decreased urine output.  · You become short of breath.  · You have chest pain.  · You cough up blood.  · The area where the incision and drainage occurred becomes numb or it tingles.  This information is not intended to replace advice given to you by your health care provider. Make sure you discuss any questions you have with your health care provider.  Document Released: 05/17/2011 Document Revised: 07/25/2015 Document Reviewed: 12/13/2014  Elsevier Interactive Patient Education © 2017 Elsevier Inc.   

## 2016-08-27 DIAGNOSIS — E119 Type 2 diabetes mellitus without complications: Secondary | ICD-10-CM | POA: Diagnosis not present

## 2016-08-27 DIAGNOSIS — I10 Essential (primary) hypertension: Secondary | ICD-10-CM | POA: Diagnosis not present

## 2016-08-27 DIAGNOSIS — N186 End stage renal disease: Secondary | ICD-10-CM | POA: Diagnosis not present

## 2016-08-27 DIAGNOSIS — L0390 Cellulitis, unspecified: Secondary | ICD-10-CM | POA: Diagnosis not present

## 2016-08-27 DIAGNOSIS — D509 Iron deficiency anemia, unspecified: Secondary | ICD-10-CM | POA: Diagnosis not present

## 2016-08-27 DIAGNOSIS — N2581 Secondary hyperparathyroidism of renal origin: Secondary | ICD-10-CM | POA: Diagnosis not present

## 2016-08-27 DIAGNOSIS — D631 Anemia in chronic kidney disease: Secondary | ICD-10-CM | POA: Diagnosis not present

## 2016-08-27 DIAGNOSIS — L89129 Pressure ulcer of left upper back, unspecified stage: Secondary | ICD-10-CM | POA: Diagnosis not present

## 2016-08-30 DIAGNOSIS — N186 End stage renal disease: Secondary | ICD-10-CM | POA: Diagnosis not present

## 2016-08-30 DIAGNOSIS — D509 Iron deficiency anemia, unspecified: Secondary | ICD-10-CM | POA: Diagnosis not present

## 2016-08-30 DIAGNOSIS — N2581 Secondary hyperparathyroidism of renal origin: Secondary | ICD-10-CM | POA: Diagnosis not present

## 2016-08-30 DIAGNOSIS — D631 Anemia in chronic kidney disease: Secondary | ICD-10-CM | POA: Diagnosis not present

## 2016-08-30 DIAGNOSIS — E119 Type 2 diabetes mellitus without complications: Secondary | ICD-10-CM | POA: Diagnosis not present

## 2016-08-31 DIAGNOSIS — L0390 Cellulitis, unspecified: Secondary | ICD-10-CM | POA: Diagnosis not present

## 2016-08-31 DIAGNOSIS — L89159 Pressure ulcer of sacral region, unspecified stage: Secondary | ICD-10-CM | POA: Diagnosis not present

## 2016-08-31 DIAGNOSIS — L89129 Pressure ulcer of left upper back, unspecified stage: Secondary | ICD-10-CM | POA: Diagnosis not present

## 2016-08-31 DIAGNOSIS — N186 End stage renal disease: Secondary | ICD-10-CM | POA: Diagnosis not present

## 2016-09-01 ENCOUNTER — Other Ambulatory Visit: Payer: Self-pay | Admitting: *Deleted

## 2016-09-01 DIAGNOSIS — N186 End stage renal disease: Secondary | ICD-10-CM | POA: Diagnosis not present

## 2016-09-01 DIAGNOSIS — D509 Iron deficiency anemia, unspecified: Secondary | ICD-10-CM | POA: Diagnosis not present

## 2016-09-01 DIAGNOSIS — N2581 Secondary hyperparathyroidism of renal origin: Secondary | ICD-10-CM | POA: Diagnosis not present

## 2016-09-01 DIAGNOSIS — C50919 Malignant neoplasm of unspecified site of unspecified female breast: Secondary | ICD-10-CM | POA: Diagnosis not present

## 2016-09-01 DIAGNOSIS — Z89612 Acquired absence of left leg above knee: Secondary | ICD-10-CM | POA: Diagnosis not present

## 2016-09-01 DIAGNOSIS — E119 Type 2 diabetes mellitus without complications: Secondary | ICD-10-CM | POA: Diagnosis not present

## 2016-09-01 DIAGNOSIS — I10 Essential (primary) hypertension: Secondary | ICD-10-CM | POA: Diagnosis not present

## 2016-09-01 DIAGNOSIS — Z89611 Acquired absence of right leg above knee: Secondary | ICD-10-CM | POA: Diagnosis not present

## 2016-09-01 DIAGNOSIS — A498 Other bacterial infections of unspecified site: Secondary | ICD-10-CM | POA: Diagnosis not present

## 2016-09-01 DIAGNOSIS — E46 Unspecified protein-calorie malnutrition: Secondary | ICD-10-CM | POA: Diagnosis not present

## 2016-09-01 DIAGNOSIS — D631 Anemia in chronic kidney disease: Secondary | ICD-10-CM | POA: Diagnosis not present

## 2016-09-01 NOTE — Patient Outreach (Signed)
[facility]) Care Management  09/01/2016  DONNE ROBILLARD 1932/05/04 [id number]   Met with patient at facility.  Patient reports she lives alone, she is bed bound. She has family that assists her with getting up into wheelchair and getting back into bed. She uses [employer] for transportation. She states she has no issues affording her medications.  Patient states she does not want anyone living with her. She has a POA, [name] who coordinates her care.   RNCM reviewed [facility] program, patient does not wish to participate. She did accept a brochure for future reference.   Spoke with Vickii Chafe, SW at facility. She states patient will discharge home 09/07/16 and will have [facility].   Plan to sign off at this time. Royetta Crochet. Laymond Purser, RN, BSN, [place] [phone]) Business Cell  [phone]) Toll Free Office

## 2016-09-03 DIAGNOSIS — D631 Anemia in chronic kidney disease: Secondary | ICD-10-CM | POA: Diagnosis not present

## 2016-09-03 DIAGNOSIS — E119 Type 2 diabetes mellitus without complications: Secondary | ICD-10-CM | POA: Diagnosis not present

## 2016-09-03 DIAGNOSIS — N2581 Secondary hyperparathyroidism of renal origin: Secondary | ICD-10-CM | POA: Diagnosis not present

## 2016-09-03 DIAGNOSIS — D509 Iron deficiency anemia, unspecified: Secondary | ICD-10-CM | POA: Diagnosis not present

## 2016-09-03 DIAGNOSIS — N186 End stage renal disease: Secondary | ICD-10-CM | POA: Diagnosis not present

## 2016-09-04 DIAGNOSIS — I120 Hypertensive chronic kidney disease with stage 5 chronic kidney disease or end stage renal disease: Secondary | ICD-10-CM | POA: Diagnosis not present

## 2016-09-04 DIAGNOSIS — Z992 Dependence on renal dialysis: Secondary | ICD-10-CM | POA: Diagnosis not present

## 2016-09-04 DIAGNOSIS — N186 End stage renal disease: Secondary | ICD-10-CM | POA: Diagnosis not present

## 2016-09-06 DIAGNOSIS — D631 Anemia in chronic kidney disease: Secondary | ICD-10-CM | POA: Diagnosis not present

## 2016-09-06 DIAGNOSIS — D509 Iron deficiency anemia, unspecified: Secondary | ICD-10-CM | POA: Diagnosis not present

## 2016-09-06 DIAGNOSIS — N186 End stage renal disease: Secondary | ICD-10-CM | POA: Diagnosis not present

## 2016-09-06 DIAGNOSIS — E119 Type 2 diabetes mellitus without complications: Secondary | ICD-10-CM | POA: Diagnosis not present

## 2016-09-06 DIAGNOSIS — N2581 Secondary hyperparathyroidism of renal origin: Secondary | ICD-10-CM | POA: Diagnosis not present

## 2016-09-08 DIAGNOSIS — D631 Anemia in chronic kidney disease: Secondary | ICD-10-CM | POA: Diagnosis not present

## 2016-09-08 DIAGNOSIS — N186 End stage renal disease: Secondary | ICD-10-CM | POA: Diagnosis not present

## 2016-09-08 DIAGNOSIS — E119 Type 2 diabetes mellitus without complications: Secondary | ICD-10-CM | POA: Diagnosis not present

## 2016-09-08 DIAGNOSIS — D509 Iron deficiency anemia, unspecified: Secondary | ICD-10-CM | POA: Diagnosis not present

## 2016-09-08 DIAGNOSIS — N2581 Secondary hyperparathyroidism of renal origin: Secondary | ICD-10-CM | POA: Diagnosis not present

## 2016-09-10 DIAGNOSIS — E119 Type 2 diabetes mellitus without complications: Secondary | ICD-10-CM | POA: Diagnosis not present

## 2016-09-10 DIAGNOSIS — D631 Anemia in chronic kidney disease: Secondary | ICD-10-CM | POA: Diagnosis not present

## 2016-09-10 DIAGNOSIS — N2581 Secondary hyperparathyroidism of renal origin: Secondary | ICD-10-CM | POA: Diagnosis not present

## 2016-09-10 DIAGNOSIS — N186 End stage renal disease: Secondary | ICD-10-CM | POA: Diagnosis not present

## 2016-09-10 DIAGNOSIS — D509 Iron deficiency anemia, unspecified: Secondary | ICD-10-CM | POA: Diagnosis not present

## 2016-09-13 DIAGNOSIS — D509 Iron deficiency anemia, unspecified: Secondary | ICD-10-CM | POA: Diagnosis not present

## 2016-09-13 DIAGNOSIS — D631 Anemia in chronic kidney disease: Secondary | ICD-10-CM | POA: Diagnosis not present

## 2016-09-13 DIAGNOSIS — E119 Type 2 diabetes mellitus without complications: Secondary | ICD-10-CM | POA: Diagnosis not present

## 2016-09-13 DIAGNOSIS — N186 End stage renal disease: Secondary | ICD-10-CM | POA: Diagnosis not present

## 2016-09-13 DIAGNOSIS — N2581 Secondary hyperparathyroidism of renal origin: Secondary | ICD-10-CM | POA: Diagnosis not present

## 2016-09-15 DIAGNOSIS — N186 End stage renal disease: Secondary | ICD-10-CM | POA: Diagnosis not present

## 2016-09-15 DIAGNOSIS — E119 Type 2 diabetes mellitus without complications: Secondary | ICD-10-CM | POA: Diagnosis not present

## 2016-09-15 DIAGNOSIS — L89159 Pressure ulcer of sacral region, unspecified stage: Secondary | ICD-10-CM | POA: Diagnosis not present

## 2016-09-15 DIAGNOSIS — D631 Anemia in chronic kidney disease: Secondary | ICD-10-CM | POA: Diagnosis not present

## 2016-09-15 DIAGNOSIS — L89129 Pressure ulcer of left upper back, unspecified stage: Secondary | ICD-10-CM | POA: Diagnosis not present

## 2016-09-15 DIAGNOSIS — D509 Iron deficiency anemia, unspecified: Secondary | ICD-10-CM | POA: Diagnosis not present

## 2016-09-15 DIAGNOSIS — L0390 Cellulitis, unspecified: Secondary | ICD-10-CM | POA: Diagnosis not present

## 2016-09-15 DIAGNOSIS — N2581 Secondary hyperparathyroidism of renal origin: Secondary | ICD-10-CM | POA: Diagnosis not present

## 2016-09-17 DIAGNOSIS — N186 End stage renal disease: Secondary | ICD-10-CM | POA: Diagnosis not present

## 2016-09-17 DIAGNOSIS — D509 Iron deficiency anemia, unspecified: Secondary | ICD-10-CM | POA: Diagnosis not present

## 2016-09-17 DIAGNOSIS — D631 Anemia in chronic kidney disease: Secondary | ICD-10-CM | POA: Diagnosis not present

## 2016-09-17 DIAGNOSIS — N2581 Secondary hyperparathyroidism of renal origin: Secondary | ICD-10-CM | POA: Diagnosis not present

## 2016-09-17 DIAGNOSIS — E119 Type 2 diabetes mellitus without complications: Secondary | ICD-10-CM | POA: Diagnosis not present

## 2016-09-20 DIAGNOSIS — D509 Iron deficiency anemia, unspecified: Secondary | ICD-10-CM | POA: Diagnosis not present

## 2016-09-20 DIAGNOSIS — N186 End stage renal disease: Secondary | ICD-10-CM | POA: Diagnosis not present

## 2016-09-20 DIAGNOSIS — N2581 Secondary hyperparathyroidism of renal origin: Secondary | ICD-10-CM | POA: Diagnosis not present

## 2016-09-20 DIAGNOSIS — D631 Anemia in chronic kidney disease: Secondary | ICD-10-CM | POA: Diagnosis not present

## 2016-09-20 DIAGNOSIS — E119 Type 2 diabetes mellitus without complications: Secondary | ICD-10-CM | POA: Diagnosis not present

## 2016-09-21 DIAGNOSIS — N186 End stage renal disease: Secondary | ICD-10-CM | POA: Diagnosis not present

## 2016-09-21 DIAGNOSIS — L89159 Pressure ulcer of sacral region, unspecified stage: Secondary | ICD-10-CM | POA: Diagnosis not present

## 2016-09-21 DIAGNOSIS — I739 Peripheral vascular disease, unspecified: Secondary | ICD-10-CM | POA: Diagnosis not present

## 2016-09-21 DIAGNOSIS — L0390 Cellulitis, unspecified: Secondary | ICD-10-CM | POA: Diagnosis not present

## 2016-09-22 DIAGNOSIS — E119 Type 2 diabetes mellitus without complications: Secondary | ICD-10-CM | POA: Diagnosis not present

## 2016-09-22 DIAGNOSIS — N2581 Secondary hyperparathyroidism of renal origin: Secondary | ICD-10-CM | POA: Diagnosis not present

## 2016-09-22 DIAGNOSIS — N186 End stage renal disease: Secondary | ICD-10-CM | POA: Diagnosis not present

## 2016-09-22 DIAGNOSIS — D631 Anemia in chronic kidney disease: Secondary | ICD-10-CM | POA: Diagnosis not present

## 2016-09-22 DIAGNOSIS — D509 Iron deficiency anemia, unspecified: Secondary | ICD-10-CM | POA: Diagnosis not present

## 2016-09-24 DIAGNOSIS — D509 Iron deficiency anemia, unspecified: Secondary | ICD-10-CM | POA: Diagnosis not present

## 2016-09-24 DIAGNOSIS — N2581 Secondary hyperparathyroidism of renal origin: Secondary | ICD-10-CM | POA: Diagnosis not present

## 2016-09-24 DIAGNOSIS — E119 Type 2 diabetes mellitus without complications: Secondary | ICD-10-CM | POA: Diagnosis not present

## 2016-09-24 DIAGNOSIS — N186 End stage renal disease: Secondary | ICD-10-CM | POA: Diagnosis not present

## 2016-09-24 DIAGNOSIS — D631 Anemia in chronic kidney disease: Secondary | ICD-10-CM | POA: Diagnosis not present

## 2016-09-27 DIAGNOSIS — E119 Type 2 diabetes mellitus without complications: Secondary | ICD-10-CM | POA: Diagnosis not present

## 2016-09-27 DIAGNOSIS — N186 End stage renal disease: Secondary | ICD-10-CM | POA: Diagnosis not present

## 2016-09-27 DIAGNOSIS — D509 Iron deficiency anemia, unspecified: Secondary | ICD-10-CM | POA: Diagnosis not present

## 2016-09-27 DIAGNOSIS — D631 Anemia in chronic kidney disease: Secondary | ICD-10-CM | POA: Diagnosis not present

## 2016-09-27 DIAGNOSIS — N2581 Secondary hyperparathyroidism of renal origin: Secondary | ICD-10-CM | POA: Diagnosis not present

## 2016-09-29 DIAGNOSIS — D631 Anemia in chronic kidney disease: Secondary | ICD-10-CM | POA: Diagnosis not present

## 2016-09-29 DIAGNOSIS — N2581 Secondary hyperparathyroidism of renal origin: Secondary | ICD-10-CM | POA: Diagnosis not present

## 2016-09-29 DIAGNOSIS — E119 Type 2 diabetes mellitus without complications: Secondary | ICD-10-CM | POA: Diagnosis not present

## 2016-09-29 DIAGNOSIS — D509 Iron deficiency anemia, unspecified: Secondary | ICD-10-CM | POA: Diagnosis not present

## 2016-09-29 DIAGNOSIS — N186 End stage renal disease: Secondary | ICD-10-CM | POA: Diagnosis not present

## 2016-09-30 DIAGNOSIS — N186 End stage renal disease: Secondary | ICD-10-CM | POA: Diagnosis not present

## 2016-09-30 DIAGNOSIS — L89159 Pressure ulcer of sacral region, unspecified stage: Secondary | ICD-10-CM | POA: Diagnosis not present

## 2016-09-30 DIAGNOSIS — L89129 Pressure ulcer of left upper back, unspecified stage: Secondary | ICD-10-CM | POA: Diagnosis not present

## 2016-09-30 DIAGNOSIS — L0390 Cellulitis, unspecified: Secondary | ICD-10-CM | POA: Diagnosis not present

## 2016-10-01 DIAGNOSIS — I10 Essential (primary) hypertension: Secondary | ICD-10-CM | POA: Diagnosis not present

## 2016-10-01 DIAGNOSIS — N186 End stage renal disease: Secondary | ICD-10-CM | POA: Diagnosis not present

## 2016-10-01 DIAGNOSIS — N2581 Secondary hyperparathyroidism of renal origin: Secondary | ICD-10-CM | POA: Diagnosis not present

## 2016-10-01 DIAGNOSIS — D631 Anemia in chronic kidney disease: Secondary | ICD-10-CM | POA: Diagnosis not present

## 2016-10-01 DIAGNOSIS — D649 Anemia, unspecified: Secondary | ICD-10-CM | POA: Diagnosis not present

## 2016-10-01 DIAGNOSIS — E119 Type 2 diabetes mellitus without complications: Secondary | ICD-10-CM | POA: Diagnosis not present

## 2016-10-01 DIAGNOSIS — D509 Iron deficiency anemia, unspecified: Secondary | ICD-10-CM | POA: Diagnosis not present

## 2016-10-01 DIAGNOSIS — F0390 Unspecified dementia without behavioral disturbance: Secondary | ICD-10-CM | POA: Diagnosis not present

## 2016-10-01 DIAGNOSIS — E46 Unspecified protein-calorie malnutrition: Secondary | ICD-10-CM | POA: Diagnosis not present

## 2016-10-01 DIAGNOSIS — Z89611 Acquired absence of right leg above knee: Secondary | ICD-10-CM | POA: Diagnosis not present

## 2016-10-01 DIAGNOSIS — I739 Peripheral vascular disease, unspecified: Secondary | ICD-10-CM | POA: Diagnosis not present

## 2016-10-04 DIAGNOSIS — N186 End stage renal disease: Secondary | ICD-10-CM | POA: Diagnosis not present

## 2016-10-04 DIAGNOSIS — E119 Type 2 diabetes mellitus without complications: Secondary | ICD-10-CM | POA: Diagnosis not present

## 2016-10-04 DIAGNOSIS — D509 Iron deficiency anemia, unspecified: Secondary | ICD-10-CM | POA: Diagnosis not present

## 2016-10-04 DIAGNOSIS — N2581 Secondary hyperparathyroidism of renal origin: Secondary | ICD-10-CM | POA: Diagnosis not present

## 2016-10-04 DIAGNOSIS — D631 Anemia in chronic kidney disease: Secondary | ICD-10-CM | POA: Diagnosis not present

## 2016-10-05 DIAGNOSIS — Z992 Dependence on renal dialysis: Secondary | ICD-10-CM | POA: Diagnosis not present

## 2016-10-05 DIAGNOSIS — I120 Hypertensive chronic kidney disease with stage 5 chronic kidney disease or end stage renal disease: Secondary | ICD-10-CM | POA: Diagnosis not present

## 2016-10-05 DIAGNOSIS — N186 End stage renal disease: Secondary | ICD-10-CM | POA: Diagnosis not present

## 2016-10-06 DIAGNOSIS — N186 End stage renal disease: Secondary | ICD-10-CM | POA: Diagnosis not present

## 2016-10-06 DIAGNOSIS — D509 Iron deficiency anemia, unspecified: Secondary | ICD-10-CM | POA: Diagnosis not present

## 2016-10-06 DIAGNOSIS — D631 Anemia in chronic kidney disease: Secondary | ICD-10-CM | POA: Diagnosis not present

## 2016-10-06 DIAGNOSIS — N2581 Secondary hyperparathyroidism of renal origin: Secondary | ICD-10-CM | POA: Diagnosis not present

## 2016-10-06 DIAGNOSIS — E119 Type 2 diabetes mellitus without complications: Secondary | ICD-10-CM | POA: Diagnosis not present

## 2016-10-06 DIAGNOSIS — Z23 Encounter for immunization: Secondary | ICD-10-CM | POA: Diagnosis not present

## 2016-10-07 DIAGNOSIS — L89159 Pressure ulcer of sacral region, unspecified stage: Secondary | ICD-10-CM | POA: Diagnosis not present

## 2016-10-07 DIAGNOSIS — N186 End stage renal disease: Secondary | ICD-10-CM | POA: Diagnosis not present

## 2016-10-07 DIAGNOSIS — L89129 Pressure ulcer of left upper back, unspecified stage: Secondary | ICD-10-CM | POA: Diagnosis not present

## 2016-10-07 DIAGNOSIS — I10 Essential (primary) hypertension: Secondary | ICD-10-CM | POA: Diagnosis not present

## 2016-10-08 DIAGNOSIS — N186 End stage renal disease: Secondary | ICD-10-CM | POA: Diagnosis not present

## 2016-10-08 DIAGNOSIS — E119 Type 2 diabetes mellitus without complications: Secondary | ICD-10-CM | POA: Diagnosis not present

## 2016-10-08 DIAGNOSIS — D631 Anemia in chronic kidney disease: Secondary | ICD-10-CM | POA: Diagnosis not present

## 2016-10-08 DIAGNOSIS — Z23 Encounter for immunization: Secondary | ICD-10-CM | POA: Diagnosis not present

## 2016-10-08 DIAGNOSIS — D509 Iron deficiency anemia, unspecified: Secondary | ICD-10-CM | POA: Diagnosis not present

## 2016-10-08 DIAGNOSIS — N2581 Secondary hyperparathyroidism of renal origin: Secondary | ICD-10-CM | POA: Diagnosis not present

## 2016-10-11 DIAGNOSIS — D631 Anemia in chronic kidney disease: Secondary | ICD-10-CM | POA: Diagnosis not present

## 2016-10-11 DIAGNOSIS — D509 Iron deficiency anemia, unspecified: Secondary | ICD-10-CM | POA: Diagnosis not present

## 2016-10-11 DIAGNOSIS — E119 Type 2 diabetes mellitus without complications: Secondary | ICD-10-CM | POA: Diagnosis not present

## 2016-10-11 DIAGNOSIS — N2581 Secondary hyperparathyroidism of renal origin: Secondary | ICD-10-CM | POA: Diagnosis not present

## 2016-10-11 DIAGNOSIS — N186 End stage renal disease: Secondary | ICD-10-CM | POA: Diagnosis not present

## 2016-10-11 DIAGNOSIS — Z23 Encounter for immunization: Secondary | ICD-10-CM | POA: Diagnosis not present

## 2016-10-13 DIAGNOSIS — D631 Anemia in chronic kidney disease: Secondary | ICD-10-CM | POA: Diagnosis not present

## 2016-10-13 DIAGNOSIS — Z23 Encounter for immunization: Secondary | ICD-10-CM | POA: Diagnosis not present

## 2016-10-13 DIAGNOSIS — N186 End stage renal disease: Secondary | ICD-10-CM | POA: Diagnosis not present

## 2016-10-13 DIAGNOSIS — N2581 Secondary hyperparathyroidism of renal origin: Secondary | ICD-10-CM | POA: Diagnosis not present

## 2016-10-13 DIAGNOSIS — E119 Type 2 diabetes mellitus without complications: Secondary | ICD-10-CM | POA: Diagnosis not present

## 2016-10-13 DIAGNOSIS — D509 Iron deficiency anemia, unspecified: Secondary | ICD-10-CM | POA: Diagnosis not present

## 2016-10-15 DIAGNOSIS — Z23 Encounter for immunization: Secondary | ICD-10-CM | POA: Diagnosis not present

## 2016-10-15 DIAGNOSIS — E119 Type 2 diabetes mellitus without complications: Secondary | ICD-10-CM | POA: Diagnosis not present

## 2016-10-15 DIAGNOSIS — N186 End stage renal disease: Secondary | ICD-10-CM | POA: Diagnosis not present

## 2016-10-15 DIAGNOSIS — N2581 Secondary hyperparathyroidism of renal origin: Secondary | ICD-10-CM | POA: Diagnosis not present

## 2016-10-15 DIAGNOSIS — D631 Anemia in chronic kidney disease: Secondary | ICD-10-CM | POA: Diagnosis not present

## 2016-10-15 DIAGNOSIS — D509 Iron deficiency anemia, unspecified: Secondary | ICD-10-CM | POA: Diagnosis not present

## 2016-10-18 DIAGNOSIS — E119 Type 2 diabetes mellitus without complications: Secondary | ICD-10-CM | POA: Diagnosis not present

## 2016-10-18 DIAGNOSIS — N2581 Secondary hyperparathyroidism of renal origin: Secondary | ICD-10-CM | POA: Diagnosis not present

## 2016-10-18 DIAGNOSIS — Z23 Encounter for immunization: Secondary | ICD-10-CM | POA: Diagnosis not present

## 2016-10-18 DIAGNOSIS — N186 End stage renal disease: Secondary | ICD-10-CM | POA: Diagnosis not present

## 2016-10-18 DIAGNOSIS — D631 Anemia in chronic kidney disease: Secondary | ICD-10-CM | POA: Diagnosis not present

## 2016-10-18 DIAGNOSIS — D509 Iron deficiency anemia, unspecified: Secondary | ICD-10-CM | POA: Diagnosis not present

## 2016-10-19 ENCOUNTER — Telehealth: Payer: Self-pay | Admitting: Oncology

## 2016-10-19 ENCOUNTER — Ambulatory Visit: Payer: Medicare Other | Admitting: Oncology

## 2016-10-19 NOTE — Telephone Encounter (Signed)
received call to r/s pt appt for today due to aide was not able to get pt ready for appt. Gave next available date/time

## 2016-10-20 DIAGNOSIS — N186 End stage renal disease: Secondary | ICD-10-CM | POA: Diagnosis not present

## 2016-10-20 DIAGNOSIS — N2581 Secondary hyperparathyroidism of renal origin: Secondary | ICD-10-CM | POA: Diagnosis not present

## 2016-10-20 DIAGNOSIS — Z23 Encounter for immunization: Secondary | ICD-10-CM | POA: Diagnosis not present

## 2016-10-20 DIAGNOSIS — E119 Type 2 diabetes mellitus without complications: Secondary | ICD-10-CM | POA: Diagnosis not present

## 2016-10-20 DIAGNOSIS — D631 Anemia in chronic kidney disease: Secondary | ICD-10-CM | POA: Diagnosis not present

## 2016-10-20 DIAGNOSIS — D509 Iron deficiency anemia, unspecified: Secondary | ICD-10-CM | POA: Diagnosis not present

## 2016-10-21 DIAGNOSIS — N186 End stage renal disease: Secondary | ICD-10-CM | POA: Diagnosis not present

## 2016-10-21 DIAGNOSIS — T82858A Stenosis of vascular prosthetic devices, implants and grafts, initial encounter: Secondary | ICD-10-CM | POA: Diagnosis not present

## 2016-10-21 DIAGNOSIS — Z992 Dependence on renal dialysis: Secondary | ICD-10-CM | POA: Diagnosis not present

## 2016-10-21 DIAGNOSIS — I871 Compression of vein: Secondary | ICD-10-CM | POA: Diagnosis not present

## 2016-10-22 DIAGNOSIS — N2581 Secondary hyperparathyroidism of renal origin: Secondary | ICD-10-CM | POA: Diagnosis not present

## 2016-10-22 DIAGNOSIS — Z23 Encounter for immunization: Secondary | ICD-10-CM | POA: Diagnosis not present

## 2016-10-22 DIAGNOSIS — D509 Iron deficiency anemia, unspecified: Secondary | ICD-10-CM | POA: Diagnosis not present

## 2016-10-22 DIAGNOSIS — E119 Type 2 diabetes mellitus without complications: Secondary | ICD-10-CM | POA: Diagnosis not present

## 2016-10-22 DIAGNOSIS — N186 End stage renal disease: Secondary | ICD-10-CM | POA: Diagnosis not present

## 2016-10-22 DIAGNOSIS — D631 Anemia in chronic kidney disease: Secondary | ICD-10-CM | POA: Diagnosis not present

## 2016-10-25 DIAGNOSIS — Z23 Encounter for immunization: Secondary | ICD-10-CM | POA: Diagnosis not present

## 2016-10-25 DIAGNOSIS — D509 Iron deficiency anemia, unspecified: Secondary | ICD-10-CM | POA: Diagnosis not present

## 2016-10-25 DIAGNOSIS — E119 Type 2 diabetes mellitus without complications: Secondary | ICD-10-CM | POA: Diagnosis not present

## 2016-10-25 DIAGNOSIS — D631 Anemia in chronic kidney disease: Secondary | ICD-10-CM | POA: Diagnosis not present

## 2016-10-25 DIAGNOSIS — N2581 Secondary hyperparathyroidism of renal origin: Secondary | ICD-10-CM | POA: Diagnosis not present

## 2016-10-25 DIAGNOSIS — N186 End stage renal disease: Secondary | ICD-10-CM | POA: Diagnosis not present

## 2016-10-26 DIAGNOSIS — C50511 Malignant neoplasm of lower-outer quadrant of right female breast: Secondary | ICD-10-CM | POA: Diagnosis not present

## 2016-10-26 DIAGNOSIS — Z89611 Acquired absence of right leg above knee: Secondary | ICD-10-CM | POA: Diagnosis not present

## 2016-10-26 DIAGNOSIS — N183 Chronic kidney disease, stage 3 (moderate): Secondary | ICD-10-CM | POA: Diagnosis not present

## 2016-10-26 DIAGNOSIS — D631 Anemia in chronic kidney disease: Secondary | ICD-10-CM | POA: Diagnosis not present

## 2016-10-26 DIAGNOSIS — Z89612 Acquired absence of left leg above knee: Secondary | ICD-10-CM | POA: Diagnosis not present

## 2016-10-26 DIAGNOSIS — Z992 Dependence on renal dialysis: Secondary | ICD-10-CM | POA: Diagnosis not present

## 2016-10-26 DIAGNOSIS — E1122 Type 2 diabetes mellitus with diabetic chronic kidney disease: Secondary | ICD-10-CM | POA: Diagnosis not present

## 2016-10-26 DIAGNOSIS — L89152 Pressure ulcer of sacral region, stage 2: Secondary | ICD-10-CM | POA: Diagnosis not present

## 2016-10-26 DIAGNOSIS — I120 Hypertensive chronic kidney disease with stage 5 chronic kidney disease or end stage renal disease: Secondary | ICD-10-CM | POA: Diagnosis not present

## 2016-10-26 DIAGNOSIS — E43 Unspecified severe protein-calorie malnutrition: Secondary | ICD-10-CM | POA: Diagnosis not present

## 2016-10-26 DIAGNOSIS — E1151 Type 2 diabetes mellitus with diabetic peripheral angiopathy without gangrene: Secondary | ICD-10-CM | POA: Diagnosis not present

## 2016-10-27 DIAGNOSIS — N186 End stage renal disease: Secondary | ICD-10-CM | POA: Diagnosis not present

## 2016-10-27 DIAGNOSIS — E1151 Type 2 diabetes mellitus with diabetic peripheral angiopathy without gangrene: Secondary | ICD-10-CM | POA: Diagnosis not present

## 2016-10-27 DIAGNOSIS — N183 Chronic kidney disease, stage 3 (moderate): Secondary | ICD-10-CM | POA: Diagnosis not present

## 2016-10-27 DIAGNOSIS — D509 Iron deficiency anemia, unspecified: Secondary | ICD-10-CM | POA: Diagnosis not present

## 2016-10-27 DIAGNOSIS — L89152 Pressure ulcer of sacral region, stage 2: Secondary | ICD-10-CM | POA: Diagnosis not present

## 2016-10-27 DIAGNOSIS — D631 Anemia in chronic kidney disease: Secondary | ICD-10-CM | POA: Diagnosis not present

## 2016-10-27 DIAGNOSIS — Z23 Encounter for immunization: Secondary | ICD-10-CM | POA: Diagnosis not present

## 2016-10-27 DIAGNOSIS — E1122 Type 2 diabetes mellitus with diabetic chronic kidney disease: Secondary | ICD-10-CM | POA: Diagnosis not present

## 2016-10-27 DIAGNOSIS — E119 Type 2 diabetes mellitus without complications: Secondary | ICD-10-CM | POA: Diagnosis not present

## 2016-10-27 DIAGNOSIS — N2581 Secondary hyperparathyroidism of renal origin: Secondary | ICD-10-CM | POA: Diagnosis not present

## 2016-10-27 DIAGNOSIS — I120 Hypertensive chronic kidney disease with stage 5 chronic kidney disease or end stage renal disease: Secondary | ICD-10-CM | POA: Diagnosis not present

## 2016-10-28 ENCOUNTER — Telehealth: Payer: Self-pay | Admitting: Oncology

## 2016-10-28 ENCOUNTER — Ambulatory Visit (HOSPITAL_BASED_OUTPATIENT_CLINIC_OR_DEPARTMENT_OTHER): Payer: Medicare Other | Admitting: Oncology

## 2016-10-28 VITALS — BP 123/57 | HR 79 | Temp 98.7°F | Resp 16 | Ht 62.0 in | Wt 123.6 lb

## 2016-10-28 DIAGNOSIS — Z170 Estrogen receptor positive status [ER+]: Secondary | ICD-10-CM

## 2016-10-28 DIAGNOSIS — D649 Anemia, unspecified: Secondary | ICD-10-CM | POA: Diagnosis not present

## 2016-10-28 DIAGNOSIS — N289 Disorder of kidney and ureter, unspecified: Secondary | ICD-10-CM | POA: Diagnosis not present

## 2016-10-28 DIAGNOSIS — C50511 Malignant neoplasm of lower-outer quadrant of right female breast: Secondary | ICD-10-CM

## 2016-10-28 NOTE — Telephone Encounter (Signed)
Gave patient avs report and calendar for upcoming appts.

## 2016-10-28 NOTE — Progress Notes (Signed)
Hematology and Oncology Follow Up Visit  [name] [id number] 21-Jan-1933 83 y.o. 10/28/2016 10:14 AM [name], MDGoldsborough, Lambert Keto, MD   Principle Diagnosis: 83 year old woman with the diagnosis of invasive ductal carcinoma of the right breast diagnosed in April 2015. She presented with a 3.1 cm mass in the right lower breast. Tumor was found to be ER positive, PR negative HER-2 negative. She also has a remote history of left sided breast cancer.  Prior Therapy: She underwent a right total mastectomy under the care of Dr. Dalbert [name] on 08/07/2013. The pathology confirmed the presence of 3.5 cm invasive ductal carcinoma grade I/III with negative surgical margins. She was ER positive PR negative. With Ki-67 of 9%. The pathological staging was pT2 NX.  She is also status post left mastectomy done in 1988.  Current therapy: Arimidex 1 mg daily started in July 2015.  Interim History:  [name] presents today for a follow-up visit with her daughter. Since the last visit, she was hospitalized in June 2018 for cellulitis and abscess that has resolved at this time. He is feeling well without any recent hospitalizations or illnesses. She continues to take Arimidex without any complications. She denied arthralgias, myalgias or GI toxicities. Her quality of life remains about the same and limited to wheelchair. She continues to live independently with her daughter and home care check in on her periodically.  She does not report any fevers or chills or sweats. Has not reported any weight loss or appetite changes. She does not report any chest pain shortness of breath cough or hemoptysis. Does not report any palpitation orthopnea or PND. Does not report any nausea or vomiting or abdominal pain. Does not report any frequency urgency or hesitancy. Does not report any worsening skeletal complaints. Does not report any lymphadenopathy or petechiae. Has not reported any other neurological symptoms of  headaches or blurry vision. Rest of the review of system is unremarkable.  Medications: I have reviewed the patient's current medications.  Current Outpatient Prescriptions  Medication Sig Dispense Refill  . Amino Acids-Protein Hydrolys (FEEDING SUPPLEMENT, PRO-STAT SUGAR FREE 64,) LIQD Take 30 mLs by mouth 2 (two) times daily. 900 mL 0  . anastrozole (ARIMIDEX) 1 MG tablet take 1 tablet by mouth once daily 30 tablet 6  . B Complex-C-Folic Acid (DIALYVITE TABLET) TABS Take 1 tablet by mouth daily after supper.  1  . calcitRIOL (ROCALTROL) 0.25 MCG capsule Take 3 capsules (0.75 mcg total) by mouth every Monday, Wednesday, and Friday with hemodialysis.    Marland Kitchen cinacalcet (SENSIPAR) 30 MG tablet Take 2 tablets (60 mg total) by mouth every Monday, Wednesday, and Friday with hemodialysis. 60 tablet 0  . collagenase (SANTYL) ointment Apply topically daily. 15 g 0  . lanthanum (FOSRENOL) 1000 MG chewable tablet Chew 1,000 mg by mouth daily after supper.     . metoprolol succinate (TOPROL XL) 25 MG 24 hr tablet Take 0.5 tablets (12.5 mg total) by mouth daily. 30 tablet 0  . Nutritional Supplements (FEEDING SUPPLEMENT, NEPRO CARB STEADY,) LIQD Take 237 mLs by mouth 2 (two) times daily between meals.  0  . oxyCODONE (OXY IR/ROXICODONE) 5 MG immediate release tablet Take 1 tablet (5 mg total) by mouth every 4 (four) hours as needed for moderate pain. 30 tablet 0   No current facility-administered medications for this visit.      Allergies:  Allergies  Allergen Reactions  . Aspirin Other (See Comments)    NO BLOOD THINNERS OF ANY KIND-bleeding events  Past Medical History, Surgical history, Social history, and Family History were reviewed and updated.  Physical Exam: Blood pressure (!) 123/57, pulse 79, temperature 98.7 F (37.1 C), temperature source Oral, resp. rate 16, height 5' 2"  (1.575 m), weight 123 lb 9.6 oz (56.1 kg), SpO2 100 %. ECOG: 2 General appearance: Alert, awake woman without  distress. Head: Normocephalic, without obvious abnormality no oral ulcers or lesions. Neck: no adenopathy Lymph nodes: Cervical, supraclavicular, and axillary nodes normal. Heart:regular rate and rhythm, S1, S2 normal, no murmur, click, rub or gallop Lung:chest clear, no wheezing, rales, normal symmetric air entry Chest wall examination revealed no tenderness or masses. Abdomin: soft, non-tender, without masses or organomegaly no rebound or guarding. EXT: Bilateral amputation noted.   Lab Results: Lab Results  Component Value Date   WBC 5.7 08/26/2016   HGB 9.2 (L) 08/26/2016   HCT 28.0 (L) 08/26/2016   MCV 90.9 08/26/2016   PLT 165 08/26/2016     Chemistry      Component Value Date/Time   NA 132 (L) 08/26/2016 0445   NA 139 09/04/2013 1025   K 3.9 08/26/2016 0445   K 4.0 09/04/2013 1025   CL 97 (L) 08/26/2016 0445   CO2 28 08/26/2016 0445   CO2 31 (H) 09/04/2013 1025   BUN 14 08/26/2016 0445   BUN 49.9 (H) 09/04/2013 1025   CREATININE 2.02 (H) 08/26/2016 0445   CREATININE 6.2 (HH) 09/04/2013 1025      Component Value Date/Time   CALCIUM 8.8 (L) 08/26/2016 0445   CALCIUM 10.4 09/04/2013 1025   ALKPHOS 72 08/21/2016 1052   ALKPHOS 152 (H) 09/04/2013 1025   AST 28 08/21/2016 1052   AST 43 (H) 09/04/2013 1025   ALT 15 08/21/2016 1052   ALT 21 09/04/2013 1025   BILITOT 0.4 08/21/2016 1052   BILITOT 0.54 09/04/2013 1025       Impression and Plan:   83 year old woman with the following issues:  1. Invasive ductal carcinoma of the right breast after presenting with 3.5 cm mass. She is status post right mastectomy with the pathological staging of T2 Nx completed in June 2015. Her clinical staging is T2 N0 with clinically negative axilla. Her tumor is ER positive HER-2 negative. With low grade features. She is status post left mastectomy for a breast cancer in 1988 and that time she was treated with tamoxifen.  She is currently on Arimidex and tolerated it well since  July 2015. Risks and benefits of continuing this medication were reviewed today and she is agreeable to continue. Plan is to finish total of 5 years of therapy which will conclude in July 2020.  2. Renal insufficiency: Is currently hemodialysis-dependent. No change in the status at this time.  3. Anemia: Related to her renal insufficiency. Hemoglobin appeared stable.  4. Follow-up: Will be in 6 months.   Zola Button, MD 8/23/201810:14 AM

## 2016-10-29 DIAGNOSIS — E119 Type 2 diabetes mellitus without complications: Secondary | ICD-10-CM | POA: Diagnosis not present

## 2016-10-29 DIAGNOSIS — D509 Iron deficiency anemia, unspecified: Secondary | ICD-10-CM | POA: Diagnosis not present

## 2016-10-29 DIAGNOSIS — D631 Anemia in chronic kidney disease: Secondary | ICD-10-CM | POA: Diagnosis not present

## 2016-10-29 DIAGNOSIS — Z23 Encounter for immunization: Secondary | ICD-10-CM | POA: Diagnosis not present

## 2016-10-29 DIAGNOSIS — N2581 Secondary hyperparathyroidism of renal origin: Secondary | ICD-10-CM | POA: Diagnosis not present

## 2016-10-29 DIAGNOSIS — N186 End stage renal disease: Secondary | ICD-10-CM | POA: Diagnosis not present

## 2016-10-30 DIAGNOSIS — Z992 Dependence on renal dialysis: Secondary | ICD-10-CM | POA: Diagnosis not present

## 2016-10-30 DIAGNOSIS — L8992 Pressure ulcer of unspecified site, stage 2: Secondary | ICD-10-CM | POA: Diagnosis not present

## 2016-10-30 DIAGNOSIS — I10 Essential (primary) hypertension: Secondary | ICD-10-CM | POA: Diagnosis not present

## 2016-10-30 DIAGNOSIS — M6281 Muscle weakness (generalized): Secondary | ICD-10-CM | POA: Diagnosis not present

## 2016-11-01 DIAGNOSIS — N2581 Secondary hyperparathyroidism of renal origin: Secondary | ICD-10-CM | POA: Diagnosis not present

## 2016-11-01 DIAGNOSIS — D509 Iron deficiency anemia, unspecified: Secondary | ICD-10-CM | POA: Diagnosis not present

## 2016-11-01 DIAGNOSIS — N186 End stage renal disease: Secondary | ICD-10-CM | POA: Diagnosis not present

## 2016-11-01 DIAGNOSIS — Z23 Encounter for immunization: Secondary | ICD-10-CM | POA: Diagnosis not present

## 2016-11-01 DIAGNOSIS — E119 Type 2 diabetes mellitus without complications: Secondary | ICD-10-CM | POA: Diagnosis not present

## 2016-11-01 DIAGNOSIS — D631 Anemia in chronic kidney disease: Secondary | ICD-10-CM | POA: Diagnosis not present

## 2016-11-02 DIAGNOSIS — I120 Hypertensive chronic kidney disease with stage 5 chronic kidney disease or end stage renal disease: Secondary | ICD-10-CM | POA: Diagnosis not present

## 2016-11-02 DIAGNOSIS — N183 Chronic kidney disease, stage 3 (moderate): Secondary | ICD-10-CM | POA: Diagnosis not present

## 2016-11-02 DIAGNOSIS — D631 Anemia in chronic kidney disease: Secondary | ICD-10-CM | POA: Diagnosis not present

## 2016-11-02 DIAGNOSIS — L89152 Pressure ulcer of sacral region, stage 2: Secondary | ICD-10-CM | POA: Diagnosis not present

## 2016-11-02 DIAGNOSIS — E1122 Type 2 diabetes mellitus with diabetic chronic kidney disease: Secondary | ICD-10-CM | POA: Diagnosis not present

## 2016-11-02 DIAGNOSIS — E1151 Type 2 diabetes mellitus with diabetic peripheral angiopathy without gangrene: Secondary | ICD-10-CM | POA: Diagnosis not present

## 2016-11-03 DIAGNOSIS — E119 Type 2 diabetes mellitus without complications: Secondary | ICD-10-CM | POA: Diagnosis not present

## 2016-11-03 DIAGNOSIS — D509 Iron deficiency anemia, unspecified: Secondary | ICD-10-CM | POA: Diagnosis not present

## 2016-11-03 DIAGNOSIS — Z23 Encounter for immunization: Secondary | ICD-10-CM | POA: Diagnosis not present

## 2016-11-03 DIAGNOSIS — D631 Anemia in chronic kidney disease: Secondary | ICD-10-CM | POA: Diagnosis not present

## 2016-11-03 DIAGNOSIS — N2581 Secondary hyperparathyroidism of renal origin: Secondary | ICD-10-CM | POA: Diagnosis not present

## 2016-11-03 DIAGNOSIS — N186 End stage renal disease: Secondary | ICD-10-CM | POA: Diagnosis not present

## 2016-11-04 DIAGNOSIS — D631 Anemia in chronic kidney disease: Secondary | ICD-10-CM | POA: Diagnosis not present

## 2016-11-04 DIAGNOSIS — L89152 Pressure ulcer of sacral region, stage 2: Secondary | ICD-10-CM | POA: Diagnosis not present

## 2016-11-04 DIAGNOSIS — E1122 Type 2 diabetes mellitus with diabetic chronic kidney disease: Secondary | ICD-10-CM | POA: Diagnosis not present

## 2016-11-04 DIAGNOSIS — I120 Hypertensive chronic kidney disease with stage 5 chronic kidney disease or end stage renal disease: Secondary | ICD-10-CM | POA: Diagnosis not present

## 2016-11-04 DIAGNOSIS — N183 Chronic kidney disease, stage 3 (moderate): Secondary | ICD-10-CM | POA: Diagnosis not present

## 2016-11-04 DIAGNOSIS — E1151 Type 2 diabetes mellitus with diabetic peripheral angiopathy without gangrene: Secondary | ICD-10-CM | POA: Diagnosis not present

## 2016-11-05 DIAGNOSIS — E1129 Type 2 diabetes mellitus with other diabetic kidney complication: Secondary | ICD-10-CM | POA: Diagnosis not present

## 2016-11-05 DIAGNOSIS — Z23 Encounter for immunization: Secondary | ICD-10-CM | POA: Diagnosis not present

## 2016-11-05 DIAGNOSIS — E119 Type 2 diabetes mellitus without complications: Secondary | ICD-10-CM | POA: Diagnosis not present

## 2016-11-05 DIAGNOSIS — Z992 Dependence on renal dialysis: Secondary | ICD-10-CM | POA: Diagnosis not present

## 2016-11-05 DIAGNOSIS — N2581 Secondary hyperparathyroidism of renal origin: Secondary | ICD-10-CM | POA: Diagnosis not present

## 2016-11-05 DIAGNOSIS — D631 Anemia in chronic kidney disease: Secondary | ICD-10-CM | POA: Diagnosis not present

## 2016-11-05 DIAGNOSIS — D509 Iron deficiency anemia, unspecified: Secondary | ICD-10-CM | POA: Diagnosis not present

## 2016-11-05 DIAGNOSIS — N186 End stage renal disease: Secondary | ICD-10-CM | POA: Diagnosis not present

## 2016-11-08 DIAGNOSIS — D509 Iron deficiency anemia, unspecified: Secondary | ICD-10-CM | POA: Diagnosis not present

## 2016-11-08 DIAGNOSIS — N2581 Secondary hyperparathyroidism of renal origin: Secondary | ICD-10-CM | POA: Diagnosis not present

## 2016-11-08 DIAGNOSIS — D631 Anemia in chronic kidney disease: Secondary | ICD-10-CM | POA: Diagnosis not present

## 2016-11-08 DIAGNOSIS — N186 End stage renal disease: Secondary | ICD-10-CM | POA: Diagnosis not present

## 2016-11-08 DIAGNOSIS — E119 Type 2 diabetes mellitus without complications: Secondary | ICD-10-CM | POA: Diagnosis not present

## 2016-11-09 DIAGNOSIS — L89152 Pressure ulcer of sacral region, stage 2: Secondary | ICD-10-CM | POA: Diagnosis not present

## 2016-11-09 DIAGNOSIS — E1122 Type 2 diabetes mellitus with diabetic chronic kidney disease: Secondary | ICD-10-CM | POA: Diagnosis not present

## 2016-11-09 DIAGNOSIS — E1151 Type 2 diabetes mellitus with diabetic peripheral angiopathy without gangrene: Secondary | ICD-10-CM | POA: Diagnosis not present

## 2016-11-09 DIAGNOSIS — N183 Chronic kidney disease, stage 3 (moderate): Secondary | ICD-10-CM | POA: Diagnosis not present

## 2016-11-09 DIAGNOSIS — I120 Hypertensive chronic kidney disease with stage 5 chronic kidney disease or end stage renal disease: Secondary | ICD-10-CM | POA: Diagnosis not present

## 2016-11-09 DIAGNOSIS — D631 Anemia in chronic kidney disease: Secondary | ICD-10-CM | POA: Diagnosis not present

## 2016-11-10 DIAGNOSIS — D509 Iron deficiency anemia, unspecified: Secondary | ICD-10-CM | POA: Diagnosis not present

## 2016-11-10 DIAGNOSIS — I739 Peripheral vascular disease, unspecified: Secondary | ICD-10-CM | POA: Diagnosis not present

## 2016-11-10 DIAGNOSIS — I10 Essential (primary) hypertension: Secondary | ICD-10-CM | POA: Diagnosis not present

## 2016-11-10 DIAGNOSIS — E119 Type 2 diabetes mellitus without complications: Secondary | ICD-10-CM | POA: Diagnosis not present

## 2016-11-10 DIAGNOSIS — N186 End stage renal disease: Secondary | ICD-10-CM | POA: Diagnosis not present

## 2016-11-10 DIAGNOSIS — N2581 Secondary hyperparathyroidism of renal origin: Secondary | ICD-10-CM | POA: Diagnosis not present

## 2016-11-10 DIAGNOSIS — D631 Anemia in chronic kidney disease: Secondary | ICD-10-CM | POA: Diagnosis not present

## 2016-11-11 DIAGNOSIS — L89152 Pressure ulcer of sacral region, stage 2: Secondary | ICD-10-CM | POA: Diagnosis not present

## 2016-11-11 DIAGNOSIS — D631 Anemia in chronic kidney disease: Secondary | ICD-10-CM | POA: Diagnosis not present

## 2016-11-11 DIAGNOSIS — I120 Hypertensive chronic kidney disease with stage 5 chronic kidney disease or end stage renal disease: Secondary | ICD-10-CM | POA: Diagnosis not present

## 2016-11-11 DIAGNOSIS — E1151 Type 2 diabetes mellitus with diabetic peripheral angiopathy without gangrene: Secondary | ICD-10-CM | POA: Diagnosis not present

## 2016-11-11 DIAGNOSIS — N183 Chronic kidney disease, stage 3 (moderate): Secondary | ICD-10-CM | POA: Diagnosis not present

## 2016-11-11 DIAGNOSIS — E1122 Type 2 diabetes mellitus with diabetic chronic kidney disease: Secondary | ICD-10-CM | POA: Diagnosis not present

## 2016-11-12 DIAGNOSIS — D509 Iron deficiency anemia, unspecified: Secondary | ICD-10-CM | POA: Diagnosis not present

## 2016-11-12 DIAGNOSIS — N186 End stage renal disease: Secondary | ICD-10-CM | POA: Diagnosis not present

## 2016-11-12 DIAGNOSIS — D631 Anemia in chronic kidney disease: Secondary | ICD-10-CM | POA: Diagnosis not present

## 2016-11-12 DIAGNOSIS — N2581 Secondary hyperparathyroidism of renal origin: Secondary | ICD-10-CM | POA: Diagnosis not present

## 2016-11-12 DIAGNOSIS — E119 Type 2 diabetes mellitus without complications: Secondary | ICD-10-CM | POA: Diagnosis not present

## 2016-11-15 DIAGNOSIS — D509 Iron deficiency anemia, unspecified: Secondary | ICD-10-CM | POA: Diagnosis not present

## 2016-11-15 DIAGNOSIS — N186 End stage renal disease: Secondary | ICD-10-CM | POA: Diagnosis not present

## 2016-11-15 DIAGNOSIS — N2581 Secondary hyperparathyroidism of renal origin: Secondary | ICD-10-CM | POA: Diagnosis not present

## 2016-11-15 DIAGNOSIS — E119 Type 2 diabetes mellitus without complications: Secondary | ICD-10-CM | POA: Diagnosis not present

## 2016-11-15 DIAGNOSIS — D631 Anemia in chronic kidney disease: Secondary | ICD-10-CM | POA: Diagnosis not present

## 2016-11-16 DIAGNOSIS — E1122 Type 2 diabetes mellitus with diabetic chronic kidney disease: Secondary | ICD-10-CM | POA: Diagnosis not present

## 2016-11-16 DIAGNOSIS — N183 Chronic kidney disease, stage 3 (moderate): Secondary | ICD-10-CM | POA: Diagnosis not present

## 2016-11-16 DIAGNOSIS — D631 Anemia in chronic kidney disease: Secondary | ICD-10-CM | POA: Diagnosis not present

## 2016-11-16 DIAGNOSIS — L89152 Pressure ulcer of sacral region, stage 2: Secondary | ICD-10-CM | POA: Diagnosis not present

## 2016-11-16 DIAGNOSIS — I120 Hypertensive chronic kidney disease with stage 5 chronic kidney disease or end stage renal disease: Secondary | ICD-10-CM | POA: Diagnosis not present

## 2016-11-16 DIAGNOSIS — E1151 Type 2 diabetes mellitus with diabetic peripheral angiopathy without gangrene: Secondary | ICD-10-CM | POA: Diagnosis not present

## 2016-11-17 DIAGNOSIS — D509 Iron deficiency anemia, unspecified: Secondary | ICD-10-CM | POA: Diagnosis not present

## 2016-11-17 DIAGNOSIS — D631 Anemia in chronic kidney disease: Secondary | ICD-10-CM | POA: Diagnosis not present

## 2016-11-17 DIAGNOSIS — N186 End stage renal disease: Secondary | ICD-10-CM | POA: Diagnosis not present

## 2016-11-17 DIAGNOSIS — N2581 Secondary hyperparathyroidism of renal origin: Secondary | ICD-10-CM | POA: Diagnosis not present

## 2016-11-17 DIAGNOSIS — E119 Type 2 diabetes mellitus without complications: Secondary | ICD-10-CM | POA: Diagnosis not present

## 2016-11-18 DIAGNOSIS — L89152 Pressure ulcer of sacral region, stage 2: Secondary | ICD-10-CM | POA: Diagnosis not present

## 2016-11-18 DIAGNOSIS — I120 Hypertensive chronic kidney disease with stage 5 chronic kidney disease or end stage renal disease: Secondary | ICD-10-CM | POA: Diagnosis not present

## 2016-11-18 DIAGNOSIS — E1151 Type 2 diabetes mellitus with diabetic peripheral angiopathy without gangrene: Secondary | ICD-10-CM | POA: Diagnosis not present

## 2016-11-18 DIAGNOSIS — E1122 Type 2 diabetes mellitus with diabetic chronic kidney disease: Secondary | ICD-10-CM | POA: Diagnosis not present

## 2016-11-18 DIAGNOSIS — D631 Anemia in chronic kidney disease: Secondary | ICD-10-CM | POA: Diagnosis not present

## 2016-11-18 DIAGNOSIS — N183 Chronic kidney disease, stage 3 (moderate): Secondary | ICD-10-CM | POA: Diagnosis not present

## 2016-11-19 DIAGNOSIS — D509 Iron deficiency anemia, unspecified: Secondary | ICD-10-CM | POA: Diagnosis not present

## 2016-11-19 DIAGNOSIS — E119 Type 2 diabetes mellitus without complications: Secondary | ICD-10-CM | POA: Diagnosis not present

## 2016-11-19 DIAGNOSIS — N186 End stage renal disease: Secondary | ICD-10-CM | POA: Diagnosis not present

## 2016-11-19 DIAGNOSIS — N2581 Secondary hyperparathyroidism of renal origin: Secondary | ICD-10-CM | POA: Diagnosis not present

## 2016-11-19 DIAGNOSIS — D631 Anemia in chronic kidney disease: Secondary | ICD-10-CM | POA: Diagnosis not present

## 2016-11-22 DIAGNOSIS — E119 Type 2 diabetes mellitus without complications: Secondary | ICD-10-CM | POA: Diagnosis not present

## 2016-11-22 DIAGNOSIS — D509 Iron deficiency anemia, unspecified: Secondary | ICD-10-CM | POA: Diagnosis not present

## 2016-11-22 DIAGNOSIS — N2581 Secondary hyperparathyroidism of renal origin: Secondary | ICD-10-CM | POA: Diagnosis not present

## 2016-11-22 DIAGNOSIS — D631 Anemia in chronic kidney disease: Secondary | ICD-10-CM | POA: Diagnosis not present

## 2016-11-22 DIAGNOSIS — N186 End stage renal disease: Secondary | ICD-10-CM | POA: Diagnosis not present

## 2016-11-23 DIAGNOSIS — E1122 Type 2 diabetes mellitus with diabetic chronic kidney disease: Secondary | ICD-10-CM | POA: Diagnosis not present

## 2016-11-23 DIAGNOSIS — E1151 Type 2 diabetes mellitus with diabetic peripheral angiopathy without gangrene: Secondary | ICD-10-CM | POA: Diagnosis not present

## 2016-11-23 DIAGNOSIS — I120 Hypertensive chronic kidney disease with stage 5 chronic kidney disease or end stage renal disease: Secondary | ICD-10-CM | POA: Diagnosis not present

## 2016-11-23 DIAGNOSIS — D631 Anemia in chronic kidney disease: Secondary | ICD-10-CM | POA: Diagnosis not present

## 2016-11-23 DIAGNOSIS — N183 Chronic kidney disease, stage 3 (moderate): Secondary | ICD-10-CM | POA: Diagnosis not present

## 2016-11-23 DIAGNOSIS — L89152 Pressure ulcer of sacral region, stage 2: Secondary | ICD-10-CM | POA: Diagnosis not present

## 2016-11-24 DIAGNOSIS — N2581 Secondary hyperparathyroidism of renal origin: Secondary | ICD-10-CM | POA: Diagnosis not present

## 2016-11-24 DIAGNOSIS — D509 Iron deficiency anemia, unspecified: Secondary | ICD-10-CM | POA: Diagnosis not present

## 2016-11-24 DIAGNOSIS — E119 Type 2 diabetes mellitus without complications: Secondary | ICD-10-CM | POA: Diagnosis not present

## 2016-11-24 DIAGNOSIS — N186 End stage renal disease: Secondary | ICD-10-CM | POA: Diagnosis not present

## 2016-11-24 DIAGNOSIS — D631 Anemia in chronic kidney disease: Secondary | ICD-10-CM | POA: Diagnosis not present

## 2016-11-25 DIAGNOSIS — E1122 Type 2 diabetes mellitus with diabetic chronic kidney disease: Secondary | ICD-10-CM | POA: Diagnosis not present

## 2016-11-25 DIAGNOSIS — E1151 Type 2 diabetes mellitus with diabetic peripheral angiopathy without gangrene: Secondary | ICD-10-CM | POA: Diagnosis not present

## 2016-11-25 DIAGNOSIS — I120 Hypertensive chronic kidney disease with stage 5 chronic kidney disease or end stage renal disease: Secondary | ICD-10-CM | POA: Diagnosis not present

## 2016-11-25 DIAGNOSIS — N183 Chronic kidney disease, stage 3 (moderate): Secondary | ICD-10-CM | POA: Diagnosis not present

## 2016-11-25 DIAGNOSIS — L89152 Pressure ulcer of sacral region, stage 2: Secondary | ICD-10-CM | POA: Diagnosis not present

## 2016-11-25 DIAGNOSIS — D631 Anemia in chronic kidney disease: Secondary | ICD-10-CM | POA: Diagnosis not present

## 2016-11-26 DIAGNOSIS — D631 Anemia in chronic kidney disease: Secondary | ICD-10-CM | POA: Diagnosis not present

## 2016-11-26 DIAGNOSIS — E119 Type 2 diabetes mellitus without complications: Secondary | ICD-10-CM | POA: Diagnosis not present

## 2016-11-26 DIAGNOSIS — N2581 Secondary hyperparathyroidism of renal origin: Secondary | ICD-10-CM | POA: Diagnosis not present

## 2016-11-26 DIAGNOSIS — N186 End stage renal disease: Secondary | ICD-10-CM | POA: Diagnosis not present

## 2016-11-26 DIAGNOSIS — D509 Iron deficiency anemia, unspecified: Secondary | ICD-10-CM | POA: Diagnosis not present

## 2016-11-29 DIAGNOSIS — D631 Anemia in chronic kidney disease: Secondary | ICD-10-CM | POA: Diagnosis not present

## 2016-11-29 DIAGNOSIS — E119 Type 2 diabetes mellitus without complications: Secondary | ICD-10-CM | POA: Diagnosis not present

## 2016-11-29 DIAGNOSIS — D509 Iron deficiency anemia, unspecified: Secondary | ICD-10-CM | POA: Diagnosis not present

## 2016-11-29 DIAGNOSIS — N186 End stage renal disease: Secondary | ICD-10-CM | POA: Diagnosis not present

## 2016-11-29 DIAGNOSIS — N2581 Secondary hyperparathyroidism of renal origin: Secondary | ICD-10-CM | POA: Diagnosis not present

## 2016-11-30 DIAGNOSIS — E1122 Type 2 diabetes mellitus with diabetic chronic kidney disease: Secondary | ICD-10-CM | POA: Diagnosis not present

## 2016-11-30 DIAGNOSIS — I120 Hypertensive chronic kidney disease with stage 5 chronic kidney disease or end stage renal disease: Secondary | ICD-10-CM | POA: Diagnosis not present

## 2016-11-30 DIAGNOSIS — L89152 Pressure ulcer of sacral region, stage 2: Secondary | ICD-10-CM | POA: Diagnosis not present

## 2016-11-30 DIAGNOSIS — D631 Anemia in chronic kidney disease: Secondary | ICD-10-CM | POA: Diagnosis not present

## 2016-11-30 DIAGNOSIS — N183 Chronic kidney disease, stage 3 (moderate): Secondary | ICD-10-CM | POA: Diagnosis not present

## 2016-11-30 DIAGNOSIS — E1151 Type 2 diabetes mellitus with diabetic peripheral angiopathy without gangrene: Secondary | ICD-10-CM | POA: Diagnosis not present

## 2016-12-01 DIAGNOSIS — N186 End stage renal disease: Secondary | ICD-10-CM | POA: Diagnosis not present

## 2016-12-01 DIAGNOSIS — N2581 Secondary hyperparathyroidism of renal origin: Secondary | ICD-10-CM | POA: Diagnosis not present

## 2016-12-01 DIAGNOSIS — D509 Iron deficiency anemia, unspecified: Secondary | ICD-10-CM | POA: Diagnosis not present

## 2016-12-01 DIAGNOSIS — D631 Anemia in chronic kidney disease: Secondary | ICD-10-CM | POA: Diagnosis not present

## 2016-12-01 DIAGNOSIS — E119 Type 2 diabetes mellitus without complications: Secondary | ICD-10-CM | POA: Diagnosis not present

## 2016-12-02 DIAGNOSIS — I120 Hypertensive chronic kidney disease with stage 5 chronic kidney disease or end stage renal disease: Secondary | ICD-10-CM | POA: Diagnosis not present

## 2016-12-02 DIAGNOSIS — L89152 Pressure ulcer of sacral region, stage 2: Secondary | ICD-10-CM | POA: Diagnosis not present

## 2016-12-02 DIAGNOSIS — E1151 Type 2 diabetes mellitus with diabetic peripheral angiopathy without gangrene: Secondary | ICD-10-CM | POA: Diagnosis not present

## 2016-12-02 DIAGNOSIS — N183 Chronic kidney disease, stage 3 (moderate): Secondary | ICD-10-CM | POA: Diagnosis not present

## 2016-12-02 DIAGNOSIS — E1122 Type 2 diabetes mellitus with diabetic chronic kidney disease: Secondary | ICD-10-CM | POA: Diagnosis not present

## 2016-12-02 DIAGNOSIS — D631 Anemia in chronic kidney disease: Secondary | ICD-10-CM | POA: Diagnosis not present

## 2016-12-03 DIAGNOSIS — N2581 Secondary hyperparathyroidism of renal origin: Secondary | ICD-10-CM | POA: Diagnosis not present

## 2016-12-03 DIAGNOSIS — N186 End stage renal disease: Secondary | ICD-10-CM | POA: Diagnosis not present

## 2016-12-03 DIAGNOSIS — D509 Iron deficiency anemia, unspecified: Secondary | ICD-10-CM | POA: Diagnosis not present

## 2016-12-03 DIAGNOSIS — D631 Anemia in chronic kidney disease: Secondary | ICD-10-CM | POA: Diagnosis not present

## 2016-12-03 DIAGNOSIS — E119 Type 2 diabetes mellitus without complications: Secondary | ICD-10-CM | POA: Diagnosis not present

## 2016-12-05 DIAGNOSIS — E1129 Type 2 diabetes mellitus with other diabetic kidney complication: Secondary | ICD-10-CM | POA: Diagnosis not present

## 2016-12-05 DIAGNOSIS — Z992 Dependence on renal dialysis: Secondary | ICD-10-CM | POA: Diagnosis not present

## 2016-12-05 DIAGNOSIS — N186 End stage renal disease: Secondary | ICD-10-CM | POA: Diagnosis not present

## 2016-12-06 DIAGNOSIS — D509 Iron deficiency anemia, unspecified: Secondary | ICD-10-CM | POA: Diagnosis not present

## 2016-12-06 DIAGNOSIS — E119 Type 2 diabetes mellitus without complications: Secondary | ICD-10-CM | POA: Diagnosis not present

## 2016-12-06 DIAGNOSIS — D631 Anemia in chronic kidney disease: Secondary | ICD-10-CM | POA: Diagnosis not present

## 2016-12-06 DIAGNOSIS — N2581 Secondary hyperparathyroidism of renal origin: Secondary | ICD-10-CM | POA: Diagnosis not present

## 2016-12-06 DIAGNOSIS — N186 End stage renal disease: Secondary | ICD-10-CM | POA: Diagnosis not present

## 2016-12-07 DIAGNOSIS — M791 Myalgia, unspecified site: Secondary | ICD-10-CM | POA: Diagnosis not present

## 2016-12-07 DIAGNOSIS — I10 Essential (primary) hypertension: Secondary | ICD-10-CM | POA: Diagnosis not present

## 2016-12-07 DIAGNOSIS — L89159 Pressure ulcer of sacral region, unspecified stage: Secondary | ICD-10-CM | POA: Diagnosis not present

## 2016-12-07 DIAGNOSIS — N186 End stage renal disease: Secondary | ICD-10-CM | POA: Diagnosis not present

## 2016-12-07 DIAGNOSIS — Z89619 Acquired absence of unspecified leg above knee: Secondary | ICD-10-CM | POA: Diagnosis not present

## 2016-12-08 DIAGNOSIS — N186 End stage renal disease: Secondary | ICD-10-CM | POA: Diagnosis not present

## 2016-12-08 DIAGNOSIS — E119 Type 2 diabetes mellitus without complications: Secondary | ICD-10-CM | POA: Diagnosis not present

## 2016-12-08 DIAGNOSIS — N2581 Secondary hyperparathyroidism of renal origin: Secondary | ICD-10-CM | POA: Diagnosis not present

## 2016-12-08 DIAGNOSIS — D509 Iron deficiency anemia, unspecified: Secondary | ICD-10-CM | POA: Diagnosis not present

## 2016-12-08 DIAGNOSIS — D631 Anemia in chronic kidney disease: Secondary | ICD-10-CM | POA: Diagnosis not present

## 2016-12-10 DIAGNOSIS — N2581 Secondary hyperparathyroidism of renal origin: Secondary | ICD-10-CM | POA: Diagnosis not present

## 2016-12-10 DIAGNOSIS — D509 Iron deficiency anemia, unspecified: Secondary | ICD-10-CM | POA: Diagnosis not present

## 2016-12-10 DIAGNOSIS — D631 Anemia in chronic kidney disease: Secondary | ICD-10-CM | POA: Diagnosis not present

## 2016-12-10 DIAGNOSIS — E119 Type 2 diabetes mellitus without complications: Secondary | ICD-10-CM | POA: Diagnosis not present

## 2016-12-10 DIAGNOSIS — N186 End stage renal disease: Secondary | ICD-10-CM | POA: Diagnosis not present

## 2016-12-13 DIAGNOSIS — E119 Type 2 diabetes mellitus without complications: Secondary | ICD-10-CM | POA: Diagnosis not present

## 2016-12-13 DIAGNOSIS — D509 Iron deficiency anemia, unspecified: Secondary | ICD-10-CM | POA: Diagnosis not present

## 2016-12-13 DIAGNOSIS — N2581 Secondary hyperparathyroidism of renal origin: Secondary | ICD-10-CM | POA: Diagnosis not present

## 2016-12-13 DIAGNOSIS — D631 Anemia in chronic kidney disease: Secondary | ICD-10-CM | POA: Diagnosis not present

## 2016-12-13 DIAGNOSIS — N186 End stage renal disease: Secondary | ICD-10-CM | POA: Diagnosis not present

## 2016-12-15 DIAGNOSIS — D509 Iron deficiency anemia, unspecified: Secondary | ICD-10-CM | POA: Diagnosis not present

## 2016-12-15 DIAGNOSIS — N2581 Secondary hyperparathyroidism of renal origin: Secondary | ICD-10-CM | POA: Diagnosis not present

## 2016-12-15 DIAGNOSIS — D631 Anemia in chronic kidney disease: Secondary | ICD-10-CM | POA: Diagnosis not present

## 2016-12-15 DIAGNOSIS — N186 End stage renal disease: Secondary | ICD-10-CM | POA: Diagnosis not present

## 2016-12-15 DIAGNOSIS — E119 Type 2 diabetes mellitus without complications: Secondary | ICD-10-CM | POA: Diagnosis not present

## 2016-12-17 DIAGNOSIS — D509 Iron deficiency anemia, unspecified: Secondary | ICD-10-CM | POA: Diagnosis not present

## 2016-12-17 DIAGNOSIS — E119 Type 2 diabetes mellitus without complications: Secondary | ICD-10-CM | POA: Diagnosis not present

## 2016-12-17 DIAGNOSIS — N2581 Secondary hyperparathyroidism of renal origin: Secondary | ICD-10-CM | POA: Diagnosis not present

## 2016-12-17 DIAGNOSIS — N186 End stage renal disease: Secondary | ICD-10-CM | POA: Diagnosis not present

## 2016-12-17 DIAGNOSIS — D631 Anemia in chronic kidney disease: Secondary | ICD-10-CM | POA: Diagnosis not present

## 2016-12-19 DIAGNOSIS — D631 Anemia in chronic kidney disease: Secondary | ICD-10-CM | POA: Diagnosis not present

## 2016-12-19 DIAGNOSIS — I120 Hypertensive chronic kidney disease with stage 5 chronic kidney disease or end stage renal disease: Secondary | ICD-10-CM | POA: Diagnosis not present

## 2016-12-19 DIAGNOSIS — E1122 Type 2 diabetes mellitus with diabetic chronic kidney disease: Secondary | ICD-10-CM | POA: Diagnosis not present

## 2016-12-19 DIAGNOSIS — E1151 Type 2 diabetes mellitus with diabetic peripheral angiopathy without gangrene: Secondary | ICD-10-CM | POA: Diagnosis not present

## 2016-12-19 DIAGNOSIS — N183 Chronic kidney disease, stage 3 (moderate): Secondary | ICD-10-CM | POA: Diagnosis not present

## 2016-12-19 DIAGNOSIS — L89152 Pressure ulcer of sacral region, stage 2: Secondary | ICD-10-CM | POA: Diagnosis not present

## 2016-12-20 DIAGNOSIS — N2581 Secondary hyperparathyroidism of renal origin: Secondary | ICD-10-CM | POA: Diagnosis not present

## 2016-12-20 DIAGNOSIS — D631 Anemia in chronic kidney disease: Secondary | ICD-10-CM | POA: Diagnosis not present

## 2016-12-20 DIAGNOSIS — E119 Type 2 diabetes mellitus without complications: Secondary | ICD-10-CM | POA: Diagnosis not present

## 2016-12-20 DIAGNOSIS — D509 Iron deficiency anemia, unspecified: Secondary | ICD-10-CM | POA: Diagnosis not present

## 2016-12-20 DIAGNOSIS — N186 End stage renal disease: Secondary | ICD-10-CM | POA: Diagnosis not present

## 2016-12-22 DIAGNOSIS — D509 Iron deficiency anemia, unspecified: Secondary | ICD-10-CM | POA: Diagnosis not present

## 2016-12-22 DIAGNOSIS — E119 Type 2 diabetes mellitus without complications: Secondary | ICD-10-CM | POA: Diagnosis not present

## 2016-12-22 DIAGNOSIS — N2581 Secondary hyperparathyroidism of renal origin: Secondary | ICD-10-CM | POA: Diagnosis not present

## 2016-12-22 DIAGNOSIS — N186 End stage renal disease: Secondary | ICD-10-CM | POA: Diagnosis not present

## 2016-12-22 DIAGNOSIS — D631 Anemia in chronic kidney disease: Secondary | ICD-10-CM | POA: Diagnosis not present

## 2016-12-24 DIAGNOSIS — N186 End stage renal disease: Secondary | ICD-10-CM | POA: Diagnosis not present

## 2016-12-24 DIAGNOSIS — E119 Type 2 diabetes mellitus without complications: Secondary | ICD-10-CM | POA: Diagnosis not present

## 2016-12-24 DIAGNOSIS — D509 Iron deficiency anemia, unspecified: Secondary | ICD-10-CM | POA: Diagnosis not present

## 2016-12-24 DIAGNOSIS — N2581 Secondary hyperparathyroidism of renal origin: Secondary | ICD-10-CM | POA: Diagnosis not present

## 2016-12-24 DIAGNOSIS — D631 Anemia in chronic kidney disease: Secondary | ICD-10-CM | POA: Diagnosis not present

## 2016-12-27 DIAGNOSIS — E119 Type 2 diabetes mellitus without complications: Secondary | ICD-10-CM | POA: Diagnosis not present

## 2016-12-27 DIAGNOSIS — N2581 Secondary hyperparathyroidism of renal origin: Secondary | ICD-10-CM | POA: Diagnosis not present

## 2016-12-27 DIAGNOSIS — N186 End stage renal disease: Secondary | ICD-10-CM | POA: Diagnosis not present

## 2016-12-27 DIAGNOSIS — D509 Iron deficiency anemia, unspecified: Secondary | ICD-10-CM | POA: Diagnosis not present

## 2016-12-27 DIAGNOSIS — D631 Anemia in chronic kidney disease: Secondary | ICD-10-CM | POA: Diagnosis not present

## 2016-12-29 DIAGNOSIS — N186 End stage renal disease: Secondary | ICD-10-CM | POA: Diagnosis not present

## 2016-12-29 DIAGNOSIS — D509 Iron deficiency anemia, unspecified: Secondary | ICD-10-CM | POA: Diagnosis not present

## 2016-12-29 DIAGNOSIS — E1129 Type 2 diabetes mellitus with other diabetic kidney complication: Secondary | ICD-10-CM | POA: Diagnosis not present

## 2016-12-29 DIAGNOSIS — D631 Anemia in chronic kidney disease: Secondary | ICD-10-CM | POA: Diagnosis not present

## 2016-12-29 DIAGNOSIS — E119 Type 2 diabetes mellitus without complications: Secondary | ICD-10-CM | POA: Diagnosis not present

## 2016-12-29 DIAGNOSIS — N2581 Secondary hyperparathyroidism of renal origin: Secondary | ICD-10-CM | POA: Diagnosis not present

## 2016-12-31 DIAGNOSIS — D509 Iron deficiency anemia, unspecified: Secondary | ICD-10-CM | POA: Diagnosis not present

## 2016-12-31 DIAGNOSIS — E119 Type 2 diabetes mellitus without complications: Secondary | ICD-10-CM | POA: Diagnosis not present

## 2016-12-31 DIAGNOSIS — D631 Anemia in chronic kidney disease: Secondary | ICD-10-CM | POA: Diagnosis not present

## 2016-12-31 DIAGNOSIS — N186 End stage renal disease: Secondary | ICD-10-CM | POA: Diagnosis not present

## 2016-12-31 DIAGNOSIS — N2581 Secondary hyperparathyroidism of renal origin: Secondary | ICD-10-CM | POA: Diagnosis not present

## 2017-01-03 DIAGNOSIS — N186 End stage renal disease: Secondary | ICD-10-CM | POA: Diagnosis not present

## 2017-01-03 DIAGNOSIS — D509 Iron deficiency anemia, unspecified: Secondary | ICD-10-CM | POA: Diagnosis not present

## 2017-01-03 DIAGNOSIS — D631 Anemia in chronic kidney disease: Secondary | ICD-10-CM | POA: Diagnosis not present

## 2017-01-03 DIAGNOSIS — E119 Type 2 diabetes mellitus without complications: Secondary | ICD-10-CM | POA: Diagnosis not present

## 2017-01-03 DIAGNOSIS — N2581 Secondary hyperparathyroidism of renal origin: Secondary | ICD-10-CM | POA: Diagnosis not present

## 2017-01-05 DIAGNOSIS — N2581 Secondary hyperparathyroidism of renal origin: Secondary | ICD-10-CM | POA: Diagnosis not present

## 2017-01-05 DIAGNOSIS — E1129 Type 2 diabetes mellitus with other diabetic kidney complication: Secondary | ICD-10-CM | POA: Diagnosis not present

## 2017-01-05 DIAGNOSIS — E119 Type 2 diabetes mellitus without complications: Secondary | ICD-10-CM | POA: Diagnosis not present

## 2017-01-05 DIAGNOSIS — N186 End stage renal disease: Secondary | ICD-10-CM | POA: Diagnosis not present

## 2017-01-05 DIAGNOSIS — Z992 Dependence on renal dialysis: Secondary | ICD-10-CM | POA: Diagnosis not present

## 2017-01-05 DIAGNOSIS — D509 Iron deficiency anemia, unspecified: Secondary | ICD-10-CM | POA: Diagnosis not present

## 2017-01-05 DIAGNOSIS — D631 Anemia in chronic kidney disease: Secondary | ICD-10-CM | POA: Diagnosis not present

## 2017-01-07 DIAGNOSIS — D509 Iron deficiency anemia, unspecified: Secondary | ICD-10-CM | POA: Diagnosis not present

## 2017-01-07 DIAGNOSIS — N186 End stage renal disease: Secondary | ICD-10-CM | POA: Diagnosis not present

## 2017-01-07 DIAGNOSIS — D631 Anemia in chronic kidney disease: Secondary | ICD-10-CM | POA: Diagnosis not present

## 2017-01-07 DIAGNOSIS — N2581 Secondary hyperparathyroidism of renal origin: Secondary | ICD-10-CM | POA: Diagnosis not present

## 2017-01-07 DIAGNOSIS — E119 Type 2 diabetes mellitus without complications: Secondary | ICD-10-CM | POA: Diagnosis not present

## 2017-01-10 DIAGNOSIS — D509 Iron deficiency anemia, unspecified: Secondary | ICD-10-CM | POA: Diagnosis not present

## 2017-01-10 DIAGNOSIS — E119 Type 2 diabetes mellitus without complications: Secondary | ICD-10-CM | POA: Diagnosis not present

## 2017-01-10 DIAGNOSIS — D631 Anemia in chronic kidney disease: Secondary | ICD-10-CM | POA: Diagnosis not present

## 2017-01-10 DIAGNOSIS — N186 End stage renal disease: Secondary | ICD-10-CM | POA: Diagnosis not present

## 2017-01-10 DIAGNOSIS — N2581 Secondary hyperparathyroidism of renal origin: Secondary | ICD-10-CM | POA: Diagnosis not present

## 2017-01-12 DIAGNOSIS — D509 Iron deficiency anemia, unspecified: Secondary | ICD-10-CM | POA: Diagnosis not present

## 2017-01-12 DIAGNOSIS — N186 End stage renal disease: Secondary | ICD-10-CM | POA: Diagnosis not present

## 2017-01-12 DIAGNOSIS — E119 Type 2 diabetes mellitus without complications: Secondary | ICD-10-CM | POA: Diagnosis not present

## 2017-01-12 DIAGNOSIS — N2581 Secondary hyperparathyroidism of renal origin: Secondary | ICD-10-CM | POA: Diagnosis not present

## 2017-01-12 DIAGNOSIS — D631 Anemia in chronic kidney disease: Secondary | ICD-10-CM | POA: Diagnosis not present

## 2017-01-14 DIAGNOSIS — N186 End stage renal disease: Secondary | ICD-10-CM | POA: Diagnosis not present

## 2017-01-14 DIAGNOSIS — D509 Iron deficiency anemia, unspecified: Secondary | ICD-10-CM | POA: Diagnosis not present

## 2017-01-14 DIAGNOSIS — E119 Type 2 diabetes mellitus without complications: Secondary | ICD-10-CM | POA: Diagnosis not present

## 2017-01-14 DIAGNOSIS — D631 Anemia in chronic kidney disease: Secondary | ICD-10-CM | POA: Diagnosis not present

## 2017-01-14 DIAGNOSIS — N2581 Secondary hyperparathyroidism of renal origin: Secondary | ICD-10-CM | POA: Diagnosis not present

## 2017-01-17 DIAGNOSIS — D631 Anemia in chronic kidney disease: Secondary | ICD-10-CM | POA: Diagnosis not present

## 2017-01-17 DIAGNOSIS — N2581 Secondary hyperparathyroidism of renal origin: Secondary | ICD-10-CM | POA: Diagnosis not present

## 2017-01-17 DIAGNOSIS — D509 Iron deficiency anemia, unspecified: Secondary | ICD-10-CM | POA: Diagnosis not present

## 2017-01-17 DIAGNOSIS — E119 Type 2 diabetes mellitus without complications: Secondary | ICD-10-CM | POA: Diagnosis not present

## 2017-01-17 DIAGNOSIS — N186 End stage renal disease: Secondary | ICD-10-CM | POA: Diagnosis not present

## 2017-01-19 DIAGNOSIS — D509 Iron deficiency anemia, unspecified: Secondary | ICD-10-CM | POA: Diagnosis not present

## 2017-01-19 DIAGNOSIS — N186 End stage renal disease: Secondary | ICD-10-CM | POA: Diagnosis not present

## 2017-01-19 DIAGNOSIS — E119 Type 2 diabetes mellitus without complications: Secondary | ICD-10-CM | POA: Diagnosis not present

## 2017-01-19 DIAGNOSIS — D631 Anemia in chronic kidney disease: Secondary | ICD-10-CM | POA: Diagnosis not present

## 2017-01-19 DIAGNOSIS — N2581 Secondary hyperparathyroidism of renal origin: Secondary | ICD-10-CM | POA: Diagnosis not present

## 2017-01-21 DIAGNOSIS — N186 End stage renal disease: Secondary | ICD-10-CM | POA: Diagnosis not present

## 2017-01-21 DIAGNOSIS — D509 Iron deficiency anemia, unspecified: Secondary | ICD-10-CM | POA: Diagnosis not present

## 2017-01-21 DIAGNOSIS — N2581 Secondary hyperparathyroidism of renal origin: Secondary | ICD-10-CM | POA: Diagnosis not present

## 2017-01-21 DIAGNOSIS — E119 Type 2 diabetes mellitus without complications: Secondary | ICD-10-CM | POA: Diagnosis not present

## 2017-01-21 DIAGNOSIS — D631 Anemia in chronic kidney disease: Secondary | ICD-10-CM | POA: Diagnosis not present

## 2017-01-23 DIAGNOSIS — E119 Type 2 diabetes mellitus without complications: Secondary | ICD-10-CM | POA: Diagnosis not present

## 2017-01-23 DIAGNOSIS — N186 End stage renal disease: Secondary | ICD-10-CM | POA: Diagnosis not present

## 2017-01-23 DIAGNOSIS — D631 Anemia in chronic kidney disease: Secondary | ICD-10-CM | POA: Diagnosis not present

## 2017-01-23 DIAGNOSIS — D509 Iron deficiency anemia, unspecified: Secondary | ICD-10-CM | POA: Diagnosis not present

## 2017-01-23 DIAGNOSIS — N2581 Secondary hyperparathyroidism of renal origin: Secondary | ICD-10-CM | POA: Diagnosis not present

## 2017-01-25 DIAGNOSIS — N186 End stage renal disease: Secondary | ICD-10-CM | POA: Diagnosis not present

## 2017-01-25 DIAGNOSIS — D631 Anemia in chronic kidney disease: Secondary | ICD-10-CM | POA: Diagnosis not present

## 2017-01-25 DIAGNOSIS — D509 Iron deficiency anemia, unspecified: Secondary | ICD-10-CM | POA: Diagnosis not present

## 2017-01-25 DIAGNOSIS — E119 Type 2 diabetes mellitus without complications: Secondary | ICD-10-CM | POA: Diagnosis not present

## 2017-01-25 DIAGNOSIS — N2581 Secondary hyperparathyroidism of renal origin: Secondary | ICD-10-CM | POA: Diagnosis not present

## 2017-01-28 DIAGNOSIS — E119 Type 2 diabetes mellitus without complications: Secondary | ICD-10-CM | POA: Diagnosis not present

## 2017-01-28 DIAGNOSIS — N186 End stage renal disease: Secondary | ICD-10-CM | POA: Diagnosis not present

## 2017-01-28 DIAGNOSIS — N2581 Secondary hyperparathyroidism of renal origin: Secondary | ICD-10-CM | POA: Diagnosis not present

## 2017-01-28 DIAGNOSIS — D631 Anemia in chronic kidney disease: Secondary | ICD-10-CM | POA: Diagnosis not present

## 2017-01-28 DIAGNOSIS — D509 Iron deficiency anemia, unspecified: Secondary | ICD-10-CM | POA: Diagnosis not present

## 2017-01-31 DIAGNOSIS — N2581 Secondary hyperparathyroidism of renal origin: Secondary | ICD-10-CM | POA: Diagnosis not present

## 2017-01-31 DIAGNOSIS — D631 Anemia in chronic kidney disease: Secondary | ICD-10-CM | POA: Diagnosis not present

## 2017-01-31 DIAGNOSIS — N186 End stage renal disease: Secondary | ICD-10-CM | POA: Diagnosis not present

## 2017-01-31 DIAGNOSIS — D509 Iron deficiency anemia, unspecified: Secondary | ICD-10-CM | POA: Diagnosis not present

## 2017-01-31 DIAGNOSIS — E119 Type 2 diabetes mellitus without complications: Secondary | ICD-10-CM | POA: Diagnosis not present

## 2017-02-02 DIAGNOSIS — D509 Iron deficiency anemia, unspecified: Secondary | ICD-10-CM | POA: Diagnosis not present

## 2017-02-02 DIAGNOSIS — N2581 Secondary hyperparathyroidism of renal origin: Secondary | ICD-10-CM | POA: Diagnosis not present

## 2017-02-02 DIAGNOSIS — N186 End stage renal disease: Secondary | ICD-10-CM | POA: Diagnosis not present

## 2017-02-02 DIAGNOSIS — E119 Type 2 diabetes mellitus without complications: Secondary | ICD-10-CM | POA: Diagnosis not present

## 2017-02-02 DIAGNOSIS — D631 Anemia in chronic kidney disease: Secondary | ICD-10-CM | POA: Diagnosis not present

## 2017-02-04 DIAGNOSIS — E1129 Type 2 diabetes mellitus with other diabetic kidney complication: Secondary | ICD-10-CM | POA: Diagnosis not present

## 2017-02-04 DIAGNOSIS — Z992 Dependence on renal dialysis: Secondary | ICD-10-CM | POA: Diagnosis not present

## 2017-02-04 DIAGNOSIS — D631 Anemia in chronic kidney disease: Secondary | ICD-10-CM | POA: Diagnosis not present

## 2017-02-04 DIAGNOSIS — N2581 Secondary hyperparathyroidism of renal origin: Secondary | ICD-10-CM | POA: Diagnosis not present

## 2017-02-04 DIAGNOSIS — N186 End stage renal disease: Secondary | ICD-10-CM | POA: Diagnosis not present

## 2017-02-04 DIAGNOSIS — D509 Iron deficiency anemia, unspecified: Secondary | ICD-10-CM | POA: Diagnosis not present

## 2017-02-04 DIAGNOSIS — E119 Type 2 diabetes mellitus without complications: Secondary | ICD-10-CM | POA: Diagnosis not present

## 2017-02-07 DIAGNOSIS — N2581 Secondary hyperparathyroidism of renal origin: Secondary | ICD-10-CM | POA: Diagnosis not present

## 2017-02-07 DIAGNOSIS — N186 End stage renal disease: Secondary | ICD-10-CM | POA: Diagnosis not present

## 2017-02-07 DIAGNOSIS — D509 Iron deficiency anemia, unspecified: Secondary | ICD-10-CM | POA: Diagnosis not present

## 2017-02-07 DIAGNOSIS — D631 Anemia in chronic kidney disease: Secondary | ICD-10-CM | POA: Diagnosis not present

## 2017-02-07 DIAGNOSIS — E119 Type 2 diabetes mellitus without complications: Secondary | ICD-10-CM | POA: Diagnosis not present

## 2017-02-09 DIAGNOSIS — D631 Anemia in chronic kidney disease: Secondary | ICD-10-CM | POA: Diagnosis not present

## 2017-02-09 DIAGNOSIS — D509 Iron deficiency anemia, unspecified: Secondary | ICD-10-CM | POA: Diagnosis not present

## 2017-02-09 DIAGNOSIS — N2581 Secondary hyperparathyroidism of renal origin: Secondary | ICD-10-CM | POA: Diagnosis not present

## 2017-02-09 DIAGNOSIS — E119 Type 2 diabetes mellitus without complications: Secondary | ICD-10-CM | POA: Diagnosis not present

## 2017-02-09 DIAGNOSIS — N186 End stage renal disease: Secondary | ICD-10-CM | POA: Diagnosis not present

## 2017-02-10 DIAGNOSIS — Z992 Dependence on renal dialysis: Secondary | ICD-10-CM | POA: Diagnosis not present

## 2017-02-10 DIAGNOSIS — N186 End stage renal disease: Secondary | ICD-10-CM | POA: Diagnosis not present

## 2017-02-10 DIAGNOSIS — T82858A Stenosis of vascular prosthetic devices, implants and grafts, initial encounter: Secondary | ICD-10-CM | POA: Diagnosis not present

## 2017-02-10 DIAGNOSIS — I871 Compression of vein: Secondary | ICD-10-CM | POA: Diagnosis not present

## 2017-02-11 DIAGNOSIS — N186 End stage renal disease: Secondary | ICD-10-CM | POA: Diagnosis not present

## 2017-02-11 DIAGNOSIS — E119 Type 2 diabetes mellitus without complications: Secondary | ICD-10-CM | POA: Diagnosis not present

## 2017-02-11 DIAGNOSIS — D509 Iron deficiency anemia, unspecified: Secondary | ICD-10-CM | POA: Diagnosis not present

## 2017-02-11 DIAGNOSIS — N2581 Secondary hyperparathyroidism of renal origin: Secondary | ICD-10-CM | POA: Diagnosis not present

## 2017-02-11 DIAGNOSIS — D631 Anemia in chronic kidney disease: Secondary | ICD-10-CM | POA: Diagnosis not present

## 2017-02-16 DIAGNOSIS — E119 Type 2 diabetes mellitus without complications: Secondary | ICD-10-CM | POA: Diagnosis not present

## 2017-02-16 DIAGNOSIS — D631 Anemia in chronic kidney disease: Secondary | ICD-10-CM | POA: Diagnosis not present

## 2017-02-16 DIAGNOSIS — N179 Acute kidney failure, unspecified: Secondary | ICD-10-CM | POA: Diagnosis not present

## 2017-02-16 DIAGNOSIS — N2581 Secondary hyperparathyroidism of renal origin: Secondary | ICD-10-CM | POA: Diagnosis not present

## 2017-02-16 DIAGNOSIS — I10 Essential (primary) hypertension: Secondary | ICD-10-CM | POA: Diagnosis not present

## 2017-02-16 DIAGNOSIS — D509 Iron deficiency anemia, unspecified: Secondary | ICD-10-CM | POA: Diagnosis not present

## 2017-02-16 DIAGNOSIS — N186 End stage renal disease: Secondary | ICD-10-CM | POA: Diagnosis not present

## 2017-02-18 DIAGNOSIS — D509 Iron deficiency anemia, unspecified: Secondary | ICD-10-CM | POA: Diagnosis not present

## 2017-02-18 DIAGNOSIS — N186 End stage renal disease: Secondary | ICD-10-CM | POA: Diagnosis not present

## 2017-02-18 DIAGNOSIS — D631 Anemia in chronic kidney disease: Secondary | ICD-10-CM | POA: Diagnosis not present

## 2017-02-18 DIAGNOSIS — N2581 Secondary hyperparathyroidism of renal origin: Secondary | ICD-10-CM | POA: Diagnosis not present

## 2017-02-18 DIAGNOSIS — E119 Type 2 diabetes mellitus without complications: Secondary | ICD-10-CM | POA: Diagnosis not present

## 2017-02-21 DIAGNOSIS — N2581 Secondary hyperparathyroidism of renal origin: Secondary | ICD-10-CM | POA: Diagnosis not present

## 2017-02-21 DIAGNOSIS — E119 Type 2 diabetes mellitus without complications: Secondary | ICD-10-CM | POA: Diagnosis not present

## 2017-02-21 DIAGNOSIS — D631 Anemia in chronic kidney disease: Secondary | ICD-10-CM | POA: Diagnosis not present

## 2017-02-21 DIAGNOSIS — D509 Iron deficiency anemia, unspecified: Secondary | ICD-10-CM | POA: Diagnosis not present

## 2017-02-21 DIAGNOSIS — N186 End stage renal disease: Secondary | ICD-10-CM | POA: Diagnosis not present

## 2017-02-23 DIAGNOSIS — D509 Iron deficiency anemia, unspecified: Secondary | ICD-10-CM | POA: Diagnosis not present

## 2017-02-23 DIAGNOSIS — D631 Anemia in chronic kidney disease: Secondary | ICD-10-CM | POA: Diagnosis not present

## 2017-02-23 DIAGNOSIS — N186 End stage renal disease: Secondary | ICD-10-CM | POA: Diagnosis not present

## 2017-02-23 DIAGNOSIS — N2581 Secondary hyperparathyroidism of renal origin: Secondary | ICD-10-CM | POA: Diagnosis not present

## 2017-02-23 DIAGNOSIS — E119 Type 2 diabetes mellitus without complications: Secondary | ICD-10-CM | POA: Diagnosis not present

## 2017-02-25 DIAGNOSIS — N186 End stage renal disease: Secondary | ICD-10-CM | POA: Diagnosis not present

## 2017-02-25 DIAGNOSIS — D509 Iron deficiency anemia, unspecified: Secondary | ICD-10-CM | POA: Diagnosis not present

## 2017-02-25 DIAGNOSIS — N2581 Secondary hyperparathyroidism of renal origin: Secondary | ICD-10-CM | POA: Diagnosis not present

## 2017-02-25 DIAGNOSIS — E119 Type 2 diabetes mellitus without complications: Secondary | ICD-10-CM | POA: Diagnosis not present

## 2017-02-25 DIAGNOSIS — D631 Anemia in chronic kidney disease: Secondary | ICD-10-CM | POA: Diagnosis not present

## 2017-02-27 DIAGNOSIS — D509 Iron deficiency anemia, unspecified: Secondary | ICD-10-CM | POA: Diagnosis not present

## 2017-02-27 DIAGNOSIS — E119 Type 2 diabetes mellitus without complications: Secondary | ICD-10-CM | POA: Diagnosis not present

## 2017-02-27 DIAGNOSIS — N2581 Secondary hyperparathyroidism of renal origin: Secondary | ICD-10-CM | POA: Diagnosis not present

## 2017-02-27 DIAGNOSIS — D631 Anemia in chronic kidney disease: Secondary | ICD-10-CM | POA: Diagnosis not present

## 2017-02-27 DIAGNOSIS — N186 End stage renal disease: Secondary | ICD-10-CM | POA: Diagnosis not present

## 2017-03-02 DIAGNOSIS — E119 Type 2 diabetes mellitus without complications: Secondary | ICD-10-CM | POA: Diagnosis not present

## 2017-03-02 DIAGNOSIS — N186 End stage renal disease: Secondary | ICD-10-CM | POA: Diagnosis not present

## 2017-03-02 DIAGNOSIS — N2581 Secondary hyperparathyroidism of renal origin: Secondary | ICD-10-CM | POA: Diagnosis not present

## 2017-03-02 DIAGNOSIS — D631 Anemia in chronic kidney disease: Secondary | ICD-10-CM | POA: Diagnosis not present

## 2017-03-02 DIAGNOSIS — D509 Iron deficiency anemia, unspecified: Secondary | ICD-10-CM | POA: Diagnosis not present

## 2017-03-04 DIAGNOSIS — D509 Iron deficiency anemia, unspecified: Secondary | ICD-10-CM | POA: Diagnosis not present

## 2017-03-04 DIAGNOSIS — N186 End stage renal disease: Secondary | ICD-10-CM | POA: Diagnosis not present

## 2017-03-04 DIAGNOSIS — D631 Anemia in chronic kidney disease: Secondary | ICD-10-CM | POA: Diagnosis not present

## 2017-03-04 DIAGNOSIS — E119 Type 2 diabetes mellitus without complications: Secondary | ICD-10-CM | POA: Diagnosis not present

## 2017-03-04 DIAGNOSIS — N2581 Secondary hyperparathyroidism of renal origin: Secondary | ICD-10-CM | POA: Diagnosis not present

## 2017-03-06 DIAGNOSIS — D631 Anemia in chronic kidney disease: Secondary | ICD-10-CM | POA: Diagnosis not present

## 2017-03-06 DIAGNOSIS — N2581 Secondary hyperparathyroidism of renal origin: Secondary | ICD-10-CM | POA: Diagnosis not present

## 2017-03-06 DIAGNOSIS — E119 Type 2 diabetes mellitus without complications: Secondary | ICD-10-CM | POA: Diagnosis not present

## 2017-03-06 DIAGNOSIS — D509 Iron deficiency anemia, unspecified: Secondary | ICD-10-CM | POA: Diagnosis not present

## 2017-03-06 DIAGNOSIS — N186 End stage renal disease: Secondary | ICD-10-CM | POA: Diagnosis not present

## 2017-03-07 DIAGNOSIS — N186 End stage renal disease: Secondary | ICD-10-CM | POA: Diagnosis not present

## 2017-03-07 DIAGNOSIS — Z992 Dependence on renal dialysis: Secondary | ICD-10-CM | POA: Diagnosis not present

## 2017-03-07 DIAGNOSIS — E1129 Type 2 diabetes mellitus with other diabetic kidney complication: Secondary | ICD-10-CM | POA: Diagnosis not present

## 2017-03-09 DIAGNOSIS — D631 Anemia in chronic kidney disease: Secondary | ICD-10-CM | POA: Diagnosis not present

## 2017-03-09 DIAGNOSIS — N186 End stage renal disease: Secondary | ICD-10-CM | POA: Diagnosis not present

## 2017-03-09 DIAGNOSIS — E119 Type 2 diabetes mellitus without complications: Secondary | ICD-10-CM | POA: Diagnosis not present

## 2017-03-09 DIAGNOSIS — D509 Iron deficiency anemia, unspecified: Secondary | ICD-10-CM | POA: Diagnosis not present

## 2017-03-09 DIAGNOSIS — N2581 Secondary hyperparathyroidism of renal origin: Secondary | ICD-10-CM | POA: Diagnosis not present

## 2017-03-11 DIAGNOSIS — D631 Anemia in chronic kidney disease: Secondary | ICD-10-CM | POA: Diagnosis not present

## 2017-03-11 DIAGNOSIS — D509 Iron deficiency anemia, unspecified: Secondary | ICD-10-CM | POA: Diagnosis not present

## 2017-03-11 DIAGNOSIS — N2581 Secondary hyperparathyroidism of renal origin: Secondary | ICD-10-CM | POA: Diagnosis not present

## 2017-03-11 DIAGNOSIS — E119 Type 2 diabetes mellitus without complications: Secondary | ICD-10-CM | POA: Diagnosis not present

## 2017-03-11 DIAGNOSIS — N186 End stage renal disease: Secondary | ICD-10-CM | POA: Diagnosis not present

## 2017-03-14 DIAGNOSIS — D631 Anemia in chronic kidney disease: Secondary | ICD-10-CM | POA: Diagnosis not present

## 2017-03-14 DIAGNOSIS — D509 Iron deficiency anemia, unspecified: Secondary | ICD-10-CM | POA: Diagnosis not present

## 2017-03-14 DIAGNOSIS — E119 Type 2 diabetes mellitus without complications: Secondary | ICD-10-CM | POA: Diagnosis not present

## 2017-03-14 DIAGNOSIS — N2581 Secondary hyperparathyroidism of renal origin: Secondary | ICD-10-CM | POA: Diagnosis not present

## 2017-03-14 DIAGNOSIS — N186 End stage renal disease: Secondary | ICD-10-CM | POA: Diagnosis not present

## 2017-03-16 DIAGNOSIS — N2581 Secondary hyperparathyroidism of renal origin: Secondary | ICD-10-CM | POA: Diagnosis not present

## 2017-03-16 DIAGNOSIS — D509 Iron deficiency anemia, unspecified: Secondary | ICD-10-CM | POA: Diagnosis not present

## 2017-03-16 DIAGNOSIS — D631 Anemia in chronic kidney disease: Secondary | ICD-10-CM | POA: Diagnosis not present

## 2017-03-16 DIAGNOSIS — E119 Type 2 diabetes mellitus without complications: Secondary | ICD-10-CM | POA: Diagnosis not present

## 2017-03-16 DIAGNOSIS — N186 End stage renal disease: Secondary | ICD-10-CM | POA: Diagnosis not present

## 2017-03-18 DIAGNOSIS — D509 Iron deficiency anemia, unspecified: Secondary | ICD-10-CM | POA: Diagnosis not present

## 2017-03-18 DIAGNOSIS — D631 Anemia in chronic kidney disease: Secondary | ICD-10-CM | POA: Diagnosis not present

## 2017-03-18 DIAGNOSIS — N2581 Secondary hyperparathyroidism of renal origin: Secondary | ICD-10-CM | POA: Diagnosis not present

## 2017-03-18 DIAGNOSIS — E119 Type 2 diabetes mellitus without complications: Secondary | ICD-10-CM | POA: Diagnosis not present

## 2017-03-18 DIAGNOSIS — N186 End stage renal disease: Secondary | ICD-10-CM | POA: Diagnosis not present

## 2017-03-21 ENCOUNTER — Emergency Department (HOSPITAL_COMMUNITY)
Admission: EM | Admit: 2017-03-21 | Discharge: 2017-03-21 | Disposition: A | Discharge status: Home / Self Care | Payer: Medicare Other | Attending: Emergency Medicine | Admitting: Emergency Medicine

## 2017-03-21 ENCOUNTER — Encounter (HOSPITAL_COMMUNITY): Payer: Self-pay | Admitting: Emergency Medicine

## 2017-03-21 DIAGNOSIS — E213 Hyperparathyroidism, unspecified: Secondary | ICD-10-CM | POA: Insufficient documentation

## 2017-03-21 DIAGNOSIS — Z886 Allergy status to analgesic agent status: Secondary | ICD-10-CM | POA: Insufficient documentation

## 2017-03-21 DIAGNOSIS — N186 End stage renal disease: Secondary | ICD-10-CM | POA: Diagnosis not present

## 2017-03-21 DIAGNOSIS — I739 Peripheral vascular disease, unspecified: Secondary | ICD-10-CM | POA: Diagnosis not present

## 2017-03-21 DIAGNOSIS — R404 Transient alteration of awareness: Secondary | ICD-10-CM | POA: Diagnosis not present

## 2017-03-21 DIAGNOSIS — I120 Hypertensive chronic kidney disease with stage 5 chronic kidney disease or end stage renal disease: Secondary | ICD-10-CM | POA: Diagnosis not present

## 2017-03-21 DIAGNOSIS — Z79899 Other long term (current) drug therapy: Secondary | ICD-10-CM | POA: Diagnosis not present

## 2017-03-21 DIAGNOSIS — D509 Iron deficiency anemia, unspecified: Secondary | ICD-10-CM | POA: Diagnosis not present

## 2017-03-21 DIAGNOSIS — Z87891 Personal history of nicotine dependence: Secondary | ICD-10-CM | POA: Diagnosis not present

## 2017-03-21 DIAGNOSIS — N2581 Secondary hyperparathyroidism of renal origin: Secondary | ICD-10-CM | POA: Diagnosis not present

## 2017-03-21 DIAGNOSIS — E162 Hypoglycemia, unspecified: Secondary | ICD-10-CM

## 2017-03-21 DIAGNOSIS — E11649 Type 2 diabetes mellitus with hypoglycemia without coma: Secondary | ICD-10-CM | POA: Diagnosis not present

## 2017-03-21 DIAGNOSIS — D631 Anemia in chronic kidney disease: Secondary | ICD-10-CM | POA: Diagnosis not present

## 2017-03-21 DIAGNOSIS — R531 Weakness: Secondary | ICD-10-CM | POA: Diagnosis not present

## 2017-03-21 DIAGNOSIS — E119 Type 2 diabetes mellitus without complications: Secondary | ICD-10-CM | POA: Diagnosis not present

## 2017-03-21 LAB — CBC WITH DIFFERENTIAL/PLATELET
Basophils Absolute: 0 10*3/uL (ref 0.0–0.1)
Basophils Relative: 0 %
Eosinophils Absolute: 0 10*3/uL (ref 0.0–0.7)
Eosinophils Relative: 1 %
HEMATOCRIT: 41.8 % (ref 36.0–46.0)
HEMOGLOBIN: 13.7 g/dL (ref 12.0–15.0)
LYMPHS ABS: 1.3 10*3/uL (ref 0.7–4.0)
LYMPHS PCT: 42 %
MCH: 30.1 pg (ref 26.0–34.0)
MCHC: 32.8 g/dL (ref 30.0–36.0)
MCV: 91.9 fL (ref 78.0–100.0)
MONOS PCT: 16 %
Monocytes Absolute: 0.5 10*3/uL (ref 0.1–1.0)
NEUTROS PCT: 41 %
Neutro Abs: 1.3 10*3/uL — ABNORMAL LOW (ref 1.7–7.7)
Platelets: 67 10*3/uL — ABNORMAL LOW (ref 150–400)
RBC: 4.55 MIL/uL (ref 3.87–5.11)
RDW: 14.9 % (ref 11.5–15.5)
WBC: 3.1 10*3/uL — ABNORMAL LOW (ref 4.0–10.5)

## 2017-03-21 LAB — BASIC METABOLIC PANEL
ANION GAP: 11 (ref 5–15)
BUN: 7 mg/dL (ref 6–20)
CO2: 25 mmol/L (ref 22–32)
Calcium: 8.7 mg/dL — ABNORMAL LOW (ref 8.9–10.3)
Chloride: 100 mmol/L — ABNORMAL LOW (ref 101–111)
Creatinine, Ser: 1.8 mg/dL — ABNORMAL HIGH (ref 0.44–1.00)
GFR calc Af Amer: 29 mL/min — ABNORMAL LOW (ref >60)
GFR calc non Af Amer: 25 mL/min — ABNORMAL LOW (ref >60)
GLUCOSE: 57 mg/dL — AB (ref 65–99)
Potassium: 3.7 mmol/L (ref 3.5–5.1)
SODIUM: 136 mmol/L (ref 135–145)

## 2017-03-21 LAB — CBG MONITORING, ED
Glucose-Capillary: 52 mg/dL — ABNORMAL LOW (ref 65–99)
Glucose-Capillary: 73 mg/dL (ref 65–99)

## 2017-03-21 NOTE — Discharge Instructions (Signed)
Your weakness appears to be related to blood sugar.  Make sure that you are eating 3 meals a day, and drinking plenty of fluids.  Also, continue using your protein, and nutritional supplements.  Follow-up with your primary care doctor in 1 week for a checkup.  Return here if needed, for problems.

## 2017-03-21 NOTE — ED Provider Notes (Signed)
[facility] EMERGENCY DEPARTMENT Provider Note   CSN: [id number] Arrival date & time: 03/21/17  1224     History   Chief Complaint Chief Complaint  Patient presents with  . Weakness    HPI [name] is a 84 y.o. female.  She presents for evaluation of weakness, starting today.  She was at dialysis today when she felt weak and therefore was transferred here by EMS for evaluation.  She reported completed her usual dialysis.  Patient lives alone, and has help there, to do her activities of daily living including cooking cleaning and getting around the house. He typically goes to dialysis by arranged bus transport.  No recent fever, chills, cough, vomiting or dizziness.  She makes urine sporadically, 2 or 3 times a week.  There is been no recent hematuria or dysuria.  There are no other known modifying factors.   HPI  Past Medical History:  Diagnosis Date  . Anemia   . Arthritis   . Bilateral breast cancer ([place])   . DM (diabetes mellitus) ([place])   . Dyslipidemia   . ESRD (end stage renal disease) on dialysis ([place])    a. [place]: MWF (07/10/2014)  . Hepatitis C   . Hyperparathyroidism   . Hypertension   . PVD (peripheral vascular disease) ([place])    a. s/p peripheral angiogram on 07/10/14 with no PCI and cont med Rx  . Vascular disease     Patient Active Problem List   Diagnosis Date Noted  . Pressure injury of skin 08/23/2016  . Cellulitis and abscess of trunk 08/21/2016  . S/P AKA (above knee amputation) bilateral ([place]) 04/22/2016  . Protein-calorie malnutrition, severe 11/20/2015  . Osteomyelitis ([place]) 11/17/2015  . Pressure ulcer 11/16/2015  . Absolute anemia 11/16/2015  . Acute post-hemorrhagic anemia 11/15/2015  . Bleeding from dialysis shunt ([place]) 11/15/2015  . Incontinence of feces 11/15/2015  . ESRD (end stage renal disease) on dialysis ([place])   . Hepatitis C   . Hypertension   . Dyslipidemia   . PVD (peripheral vascular disease) ([place])  07/02/2014  . End stage renal disease ([place]) 08/08/2013  . Postoperative wound hematoma 08/08/2013  . Breast cancer of lower-outer quadrant of right female breast ([place]) 07/19/2013    Past Surgical History:  Procedure Laterality Date  . ABDOMINAL HYSTERECTOMY    . AMPUTATION Bilateral 11/21/2015   Procedure: AMPUTATION ABOVE KNEE;  Surgeon: Newt Minion, MD;  Location: [facility];  Service: Orthopedics;  Laterality: Bilateral;  . APPENDECTOMY    . ARTERIOVENOUS GRAFT PLACEMENT Left 03/20/09   "had right arm previously but couldn't use it anymore"  . BACK SURGERY    . BREAST BIOPSY Bilateral   . BREAST BIOPSY Right 08/08/2013   Procedure: Evacuation Hematoma Right Chest;  Surgeon: [name], MD;  Location: [facility];  Service: General;  Laterality: Right;  . CATARACT EXTRACTION Right   . DG AV DIALYSIS GRAFT DECLOT OR Left 01/27/11, 02/15/11, 03/15/11, 10/13/11   lua  . EVACUATION BREAST HEMATOMA  2001; 2015   left; right  . INSERTION OF DIALYSIS CATHETER Right 11/15/2015   Procedure: INSERTION OF Right Femoral  DIALYSIS CATHETER.;  Surgeon: Rosetta Posner, MD;  Location: [facility];  Service: Vascular;  Laterality: Right;  . MASTECTOMY Left 1970's?  Marland Kitchen MASTECTOMY COMPLETE / SIMPLE Right 08/07/2013  . MEDIAN NERVE REPAIR Left    "decompression"  . PERIPHERAL VASCULAR CATHETERIZATION N/A 07/10/2014   Procedure: Abdominal Aortogram;  Surgeon: Mertie Clause  Fletcher Anon, MD;  Location: [facility] INVASIVE CV LAB CUPID;  Service: Cardiovascular;  Laterality: N/A;  . POSTERIOR FUSION CERVICAL SPINE     "have 6 screws"  . THROMBECTOMY AND REVISION OF ARTERIOVENTOUS (AV) GORETEX  GRAFT Left 04/18/2009  . THROMBECTOMY AND REVISION OF ARTERIOVENTOUS (AV) GORETEX  GRAFT Left 11/15/2015   Procedure: Thrombectomy and Revision of Left ARm AV Gortex Graft.;  Surgeon: Rosetta Posner, MD;  Location: [facility];  Service: Vascular;  Laterality: Left;  . TOE AMPUTATION Right    1,2nd toes  . TONSILLECTOMY    . TOTAL MASTECTOMY Right 08/07/2013    Procedure: RIGHT TOTAL MASTECTOMY;  Surgeon: [name], MD;  Location: [facility];  Service: General;  Laterality: Right;  . UTERINE FIBROID SURGERY      OB History    No data available       Home Medications    Prior to Admission medications   Medication Sig Start Date End Date Taking? Authorizing Provider  anastrozole (ARIMIDEX) 1 MG tablet take 1 tablet by mouth once daily 07/04/16  Yes Shadad, Mathis Dad, MD  B Complex-C-Folic Acid (DIALYVITE TABLET) TABS Take 1 tablet by mouth daily after supper. 10/31/15  Yes [provider]  calcitRIOL (ROCALTROL) 0.25 MCG capsule Take 3 capsules (0.75 mcg total) by mouth every Monday, Wednesday, and Friday with hemodialysis. 08/27/16  Yes Theodis Blaze, MD  cinacalcet [facility]) 30 MG tablet Take 2 tablets (60 mg total) by mouth every Monday, Wednesday, and Friday with hemodialysis. 08/27/16  Yes Theodis Blaze, MD  collagenase (SANTYL) ointment Apply topically daily. Patient taking differently: Apply 1 application topically daily.  08/26/16  Yes Theodis Blaze, MD  ENSURE (ENSURE) Take 237 mLs by mouth 2 (two) times daily between meals.   Yes [provider]  lanthanum (FOSRENOL) 1000 MG chewable tablet Chew 1,000 mg by mouth daily after supper.    Yes [provider]  metoprolol succinate (TOPROL XL) 25 MG 24 hr tablet Take 0.5 tablets (12.5 mg total) by mouth daily. Patient taking differently: Take 25 mg by mouth at bedtime.  08/27/16  Yes Theodis Blaze, MD  Amino Acids-Protein Hydrolys (FEEDING SUPPLEMENT, PRO-STAT SUGAR FREE 64,) LIQD Take 30 mLs by mouth 2 (two) times daily. Patient not taking: Reported on 03/21/2017 08/26/16   Theodis Blaze, MD  Nutritional Supplements (FEEDING SUPPLEMENT, NEPRO CARB STEADY,) LIQD Take 237 mLs by mouth 2 (two) times daily between meals. Patient not taking: Reported on 03/21/2017 11/24/15   Elgergawy, Silver Huguenin, MD  oxyCODONE (OXY IR/ROXICODONE) 5 MG immediate release tablet Take 1 tablet  (5 mg total) by mouth every 4 (four) hours as needed for moderate pain. Patient not taking: Reported on 03/21/2017 08/26/16   Theodis Blaze, MD    Family History Family History  Problem Relation Age of Onset  . Cancer Mother   . Hypertension Mother     Social History Social History   Tobacco Use  . Smoking status: Former Smoker    Types: Cigarettes  . Smokeless tobacco: Never Used  . Tobacco comment: "stopped smoking in 1969"  Substance Use Topics  . Alcohol use: Yes    Alcohol/week: 0.0 oz    Comment: "no alcohol since 1969"  . Drug use: No     Allergies   Aspirin   Review of Systems Review of Systems  All other systems reviewed and are negative.    Physical Exam Updated Vital Signs BP (!) 135/48 (BP Location: Right  Arm)   Pulse 60   Temp 98.1 F (36.7 C) (Oral)   Resp 17   Ht 5\' 3"  (1.6 m)   Wt 55.8 kg (123 lb)   SpO2 100%   BMI 21.79 kg/m   Physical Exam  Constitutional: She is oriented to person, place, and time. She appears well-developed.  Elderly, frail.  Chronically ill-appearing  HENT:  Head: Normocephalic and atraumatic.  Right Ear: External ear normal.  Left Ear: External ear normal.  Eyes: Conjunctivae and EOM are normal. Pupils are equal, round, and reactive to light.  Neck: Normal range of motion and phonation normal. Neck supple.  Cardiovascular: Normal rate, regular rhythm and normal heart sounds.  Pulmonary/Chest: Effort normal and breath sounds normal. She exhibits no bony tenderness.  Abdominal: Soft. There is no tenderness.  Musculoskeletal:  Bilateral above-knee amputations.  Otherwise normal motion of arms and legs.  Neurological: She is alert and oriented to person, place, and time. No cranial nerve deficit or sensory deficit. She exhibits normal muscle tone. Coordination normal.  Skin: Skin is warm, dry and intact.  Psychiatric: She has a normal mood and affect. Her behavior is normal.  Nursing note and vitals  reviewed.    ED Treatments / Results  Labs (all labs ordered are listed, but only abnormal results are displayed) Labs Reviewed  BASIC METABOLIC PANEL - Abnormal; Notable for the following components:      Result Value   Chloride 100 (*)    Glucose, Bld 57 (*)    Creatinine, Ser 1.80 (*)    Calcium 8.7 (*)    GFR calc non Af Amer 25 (*)    GFR calc Af Amer 29 (*)    All other components within normal limits  CBC WITH DIFFERENTIAL/PLATELET - Abnormal; Notable for the following components:   WBC 3.1 (*)    Platelets 67 (*)    Neutro Abs 1.3 (*)    All other components within normal limits  CBG MONITORING, ED - Abnormal; Notable for the following components:   Glucose-Capillary 52 (*)    All other components within normal limits  CBG MONITORING, ED    EKG  EKG Interpretation  Date/Time:  Monday March 21 2017 13:04:39 EST Ventricular Rate:  65 PR Interval:  206 QRS Duration: 136 QT Interval:  446 QTC Calculation: 463 R Axis:   23 Text Interpretation:  Sinus rhythm with occasional Premature ventricular complexes Left bundle branch block Abnormal ECG since last tracing no significant change Confirmed by Daleen Bo [phone]) on 03/21/2017 3:27:35 PM       Radiology No results found.  Procedures Procedures (including critical care time)  Medications Ordered in ED Medications - No data to display   Initial Impression / Assessment and Plan / ED Course  I have reviewed the triage vital signs and the nursing notes.  Pertinent labs & imaging results that were available during my care of the patient were reviewed by me and considered in my medical decision making (see chart for details).      Patient Vitals for the past 24 hrs:  BP Temp Temp src Pulse Resp SpO2 Height Weight  03/21/17 1523 (!) 135/48 - - 60 17 100 % - -  03/21/17 1400 133/64 - - - 18 - - -  03/21/17 1345 (!) 124/107 - - - 12 - - -  03/21/17 1330 99/87 - - - 18 - - -  03/21/17 1315 (!) 130/54 -  - - 16 - - -  03/21/17 1245 120/74 - - 84 13 90 % - -  03/21/17 1233 - - - - - - 5\' 3"  (1.6 m) 55.8 kg (123 lb)  03/21/17 1231 (!) 150/33 98.1 F (36.7 C) Oral 62 (!) 24 - - -    15:58 PM Reevaluation with update and discussion. After initial assessment and treatment, an updated evaluation reveals patient is alert, and feels better.  She has been able to eat lunch.  Repeat CBG is low. Radley Barto   17: 00-repeat CBG normal   Final Clinical Impressions(s) / ED Diagnoses   Final diagnoses:  Weakness  Hypoglycemia   Nonspecific weakness with hypoglycemia.  Doubt serious bacterial infection metabolic instability or impending vascular collapse.  Nursing Notes Reviewed/ Care Coordinated Applicable Imaging Reviewed Interpretation of Laboratory Data incorporated into ED treatment  The patient appears reasonably screened and/or stabilized for discharge and I doubt any other medical condition or other [facility] requiring further screening, evaluation, or treatment in the ED at this time prior to discharge.  Plan: Home Medications-continue current medications; Home Treatments-try to eat and drink more; return here if the recommended treatment, does not improve the symptoms; Recommended follow up-PCP check in 1 week and as needed.   ED Discharge Orders    None       Daleen Bo, MD 03/21/17 [phone]

## 2017-03-21 NOTE — ED Notes (Signed)
Patient given discharge instructions and verbalized understanding.  Patient stable to discharge at this time.  Patient is alert and oriented to baseline.  No distressed noted at this time.  All belongings taken with the patient at discharge.   

## 2017-03-21 NOTE — ED Notes (Signed)
Pt is AOx4, she states feeling better after eating a sandwich and drinking milk.

## 2017-03-21 NOTE — ED Notes (Signed)
CALLED [facility] TO CHECK ON STATUS OF [facility]

## 2017-03-21 NOTE — ED Notes (Signed)
[facility] called and said it would be 2-3 hours before a truck would arrive.

## 2017-03-21 NOTE — ED Notes (Signed)
Pt given orange juice graham crackers and peanut butter.

## 2017-03-21 NOTE — ED Notes (Signed)
[facility] here to pick up patient 

## 2017-03-21 NOTE — ED Triage Notes (Signed)
Per EMS: Pt c/o weakness x 1 week.  Pt went to dialysis today and had full treatment.  CBG of 89, 130/64, 68 HR w/ Left BBB w/ PVC's.

## 2017-03-21 NOTE — ED Notes (Signed)
Call [name] when [facility] has arrived for pt.

## 2017-03-22 DIAGNOSIS — B999 Unspecified infectious disease: Secondary | ICD-10-CM | POA: Diagnosis not present

## 2017-03-22 DIAGNOSIS — N179 Acute kidney failure, unspecified: Secondary | ICD-10-CM | POA: Diagnosis not present

## 2017-03-23 DIAGNOSIS — D631 Anemia in chronic kidney disease: Secondary | ICD-10-CM | POA: Diagnosis not present

## 2017-03-23 DIAGNOSIS — D509 Iron deficiency anemia, unspecified: Secondary | ICD-10-CM | POA: Diagnosis not present

## 2017-03-23 DIAGNOSIS — E119 Type 2 diabetes mellitus without complications: Secondary | ICD-10-CM | POA: Diagnosis not present

## 2017-03-23 DIAGNOSIS — N186 End stage renal disease: Secondary | ICD-10-CM | POA: Diagnosis not present

## 2017-03-23 DIAGNOSIS — N2581 Secondary hyperparathyroidism of renal origin: Secondary | ICD-10-CM | POA: Diagnosis not present

## 2017-03-25 DIAGNOSIS — D631 Anemia in chronic kidney disease: Secondary | ICD-10-CM | POA: Diagnosis not present

## 2017-03-25 DIAGNOSIS — D509 Iron deficiency anemia, unspecified: Secondary | ICD-10-CM | POA: Diagnosis not present

## 2017-03-25 DIAGNOSIS — E119 Type 2 diabetes mellitus without complications: Secondary | ICD-10-CM | POA: Diagnosis not present

## 2017-03-25 DIAGNOSIS — N2581 Secondary hyperparathyroidism of renal origin: Secondary | ICD-10-CM | POA: Diagnosis not present

## 2017-03-25 DIAGNOSIS — N186 End stage renal disease: Secondary | ICD-10-CM | POA: Diagnosis not present

## 2017-03-28 DIAGNOSIS — E119 Type 2 diabetes mellitus without complications: Secondary | ICD-10-CM | POA: Diagnosis not present

## 2017-03-28 DIAGNOSIS — D509 Iron deficiency anemia, unspecified: Secondary | ICD-10-CM | POA: Diagnosis not present

## 2017-03-28 DIAGNOSIS — D631 Anemia in chronic kidney disease: Secondary | ICD-10-CM | POA: Diagnosis not present

## 2017-03-28 DIAGNOSIS — N2581 Secondary hyperparathyroidism of renal origin: Secondary | ICD-10-CM | POA: Diagnosis not present

## 2017-03-28 DIAGNOSIS — N186 End stage renal disease: Secondary | ICD-10-CM | POA: Diagnosis not present

## 2017-03-30 DIAGNOSIS — E119 Type 2 diabetes mellitus without complications: Secondary | ICD-10-CM | POA: Diagnosis not present

## 2017-03-30 DIAGNOSIS — N2581 Secondary hyperparathyroidism of renal origin: Secondary | ICD-10-CM | POA: Diagnosis not present

## 2017-03-30 DIAGNOSIS — E1129 Type 2 diabetes mellitus with other diabetic kidney complication: Secondary | ICD-10-CM | POA: Diagnosis not present

## 2017-03-30 DIAGNOSIS — D509 Iron deficiency anemia, unspecified: Secondary | ICD-10-CM | POA: Diagnosis not present

## 2017-03-30 DIAGNOSIS — N186 End stage renal disease: Secondary | ICD-10-CM | POA: Diagnosis not present

## 2017-03-30 DIAGNOSIS — D631 Anemia in chronic kidney disease: Secondary | ICD-10-CM | POA: Diagnosis not present

## 2017-04-01 DIAGNOSIS — N2581 Secondary hyperparathyroidism of renal origin: Secondary | ICD-10-CM | POA: Diagnosis not present

## 2017-04-01 DIAGNOSIS — D631 Anemia in chronic kidney disease: Secondary | ICD-10-CM | POA: Diagnosis not present

## 2017-04-01 DIAGNOSIS — N186 End stage renal disease: Secondary | ICD-10-CM | POA: Diagnosis not present

## 2017-04-01 DIAGNOSIS — D509 Iron deficiency anemia, unspecified: Secondary | ICD-10-CM | POA: Diagnosis not present

## 2017-04-01 DIAGNOSIS — E119 Type 2 diabetes mellitus without complications: Secondary | ICD-10-CM | POA: Diagnosis not present

## 2017-04-04 DIAGNOSIS — E119 Type 2 diabetes mellitus without complications: Secondary | ICD-10-CM | POA: Diagnosis not present

## 2017-04-04 DIAGNOSIS — N179 Acute kidney failure, unspecified: Secondary | ICD-10-CM | POA: Diagnosis not present

## 2017-04-04 DIAGNOSIS — S78119A Complete traumatic amputation at level between unspecified hip and knee, initial encounter: Secondary | ICD-10-CM | POA: Diagnosis not present

## 2017-04-04 DIAGNOSIS — N186 End stage renal disease: Secondary | ICD-10-CM | POA: Diagnosis not present

## 2017-04-04 DIAGNOSIS — D509 Iron deficiency anemia, unspecified: Secondary | ICD-10-CM | POA: Diagnosis not present

## 2017-04-04 DIAGNOSIS — N2581 Secondary hyperparathyroidism of renal origin: Secondary | ICD-10-CM | POA: Diagnosis not present

## 2017-04-04 DIAGNOSIS — D631 Anemia in chronic kidney disease: Secondary | ICD-10-CM | POA: Diagnosis not present

## 2017-04-06 DIAGNOSIS — D631 Anemia in chronic kidney disease: Secondary | ICD-10-CM | POA: Diagnosis not present

## 2017-04-06 DIAGNOSIS — N2581 Secondary hyperparathyroidism of renal origin: Secondary | ICD-10-CM | POA: Diagnosis not present

## 2017-04-06 DIAGNOSIS — D509 Iron deficiency anemia, unspecified: Secondary | ICD-10-CM | POA: Diagnosis not present

## 2017-04-06 DIAGNOSIS — N186 End stage renal disease: Secondary | ICD-10-CM | POA: Diagnosis not present

## 2017-04-06 DIAGNOSIS — E119 Type 2 diabetes mellitus without complications: Secondary | ICD-10-CM | POA: Diagnosis not present

## 2017-04-07 DIAGNOSIS — E1129 Type 2 diabetes mellitus with other diabetic kidney complication: Secondary | ICD-10-CM | POA: Diagnosis not present

## 2017-04-07 DIAGNOSIS — Z992 Dependence on renal dialysis: Secondary | ICD-10-CM | POA: Diagnosis not present

## 2017-04-07 DIAGNOSIS — N186 End stage renal disease: Secondary | ICD-10-CM | POA: Diagnosis not present

## 2017-04-08 DIAGNOSIS — D631 Anemia in chronic kidney disease: Secondary | ICD-10-CM | POA: Diagnosis not present

## 2017-04-08 DIAGNOSIS — N186 End stage renal disease: Secondary | ICD-10-CM | POA: Diagnosis not present

## 2017-04-08 DIAGNOSIS — E1129 Type 2 diabetes mellitus with other diabetic kidney complication: Secondary | ICD-10-CM | POA: Diagnosis not present

## 2017-04-08 DIAGNOSIS — E119 Type 2 diabetes mellitus without complications: Secondary | ICD-10-CM | POA: Diagnosis not present

## 2017-04-08 DIAGNOSIS — D509 Iron deficiency anemia, unspecified: Secondary | ICD-10-CM | POA: Diagnosis not present

## 2017-04-08 DIAGNOSIS — N2581 Secondary hyperparathyroidism of renal origin: Secondary | ICD-10-CM | POA: Diagnosis not present

## 2017-04-08 DIAGNOSIS — Z992 Dependence on renal dialysis: Secondary | ICD-10-CM | POA: Diagnosis not present

## 2017-04-11 DIAGNOSIS — N2581 Secondary hyperparathyroidism of renal origin: Secondary | ICD-10-CM | POA: Diagnosis not present

## 2017-04-11 DIAGNOSIS — E119 Type 2 diabetes mellitus without complications: Secondary | ICD-10-CM | POA: Diagnosis not present

## 2017-04-11 DIAGNOSIS — N186 End stage renal disease: Secondary | ICD-10-CM | POA: Diagnosis not present

## 2017-04-11 DIAGNOSIS — D509 Iron deficiency anemia, unspecified: Secondary | ICD-10-CM | POA: Diagnosis not present

## 2017-04-11 DIAGNOSIS — D631 Anemia in chronic kidney disease: Secondary | ICD-10-CM | POA: Diagnosis not present

## 2017-04-13 DIAGNOSIS — D509 Iron deficiency anemia, unspecified: Secondary | ICD-10-CM | POA: Diagnosis not present

## 2017-04-13 DIAGNOSIS — N186 End stage renal disease: Secondary | ICD-10-CM | POA: Diagnosis not present

## 2017-04-13 DIAGNOSIS — N2581 Secondary hyperparathyroidism of renal origin: Secondary | ICD-10-CM | POA: Diagnosis not present

## 2017-04-13 DIAGNOSIS — E119 Type 2 diabetes mellitus without complications: Secondary | ICD-10-CM | POA: Diagnosis not present

## 2017-04-13 DIAGNOSIS — D631 Anemia in chronic kidney disease: Secondary | ICD-10-CM | POA: Diagnosis not present

## 2017-04-15 DIAGNOSIS — E119 Type 2 diabetes mellitus without complications: Secondary | ICD-10-CM | POA: Diagnosis not present

## 2017-04-15 DIAGNOSIS — N2581 Secondary hyperparathyroidism of renal origin: Secondary | ICD-10-CM | POA: Diagnosis not present

## 2017-04-15 DIAGNOSIS — D509 Iron deficiency anemia, unspecified: Secondary | ICD-10-CM | POA: Diagnosis not present

## 2017-04-15 DIAGNOSIS — N186 End stage renal disease: Secondary | ICD-10-CM | POA: Diagnosis not present

## 2017-04-15 DIAGNOSIS — D631 Anemia in chronic kidney disease: Secondary | ICD-10-CM | POA: Diagnosis not present

## 2017-04-18 DIAGNOSIS — D631 Anemia in chronic kidney disease: Secondary | ICD-10-CM | POA: Diagnosis not present

## 2017-04-18 DIAGNOSIS — D509 Iron deficiency anemia, unspecified: Secondary | ICD-10-CM | POA: Diagnosis not present

## 2017-04-18 DIAGNOSIS — N186 End stage renal disease: Secondary | ICD-10-CM | POA: Diagnosis not present

## 2017-04-18 DIAGNOSIS — E119 Type 2 diabetes mellitus without complications: Secondary | ICD-10-CM | POA: Diagnosis not present

## 2017-04-18 DIAGNOSIS — N2581 Secondary hyperparathyroidism of renal origin: Secondary | ICD-10-CM | POA: Diagnosis not present

## 2017-04-20 DIAGNOSIS — D631 Anemia in chronic kidney disease: Secondary | ICD-10-CM | POA: Diagnosis not present

## 2017-04-20 DIAGNOSIS — N186 End stage renal disease: Secondary | ICD-10-CM | POA: Diagnosis not present

## 2017-04-20 DIAGNOSIS — E119 Type 2 diabetes mellitus without complications: Secondary | ICD-10-CM | POA: Diagnosis not present

## 2017-04-20 DIAGNOSIS — N2581 Secondary hyperparathyroidism of renal origin: Secondary | ICD-10-CM | POA: Diagnosis not present

## 2017-04-20 DIAGNOSIS — D509 Iron deficiency anemia, unspecified: Secondary | ICD-10-CM | POA: Diagnosis not present

## 2017-04-22 DIAGNOSIS — E119 Type 2 diabetes mellitus without complications: Secondary | ICD-10-CM | POA: Diagnosis not present

## 2017-04-22 DIAGNOSIS — D631 Anemia in chronic kidney disease: Secondary | ICD-10-CM | POA: Diagnosis not present

## 2017-04-22 DIAGNOSIS — N2581 Secondary hyperparathyroidism of renal origin: Secondary | ICD-10-CM | POA: Diagnosis not present

## 2017-04-22 DIAGNOSIS — N186 End stage renal disease: Secondary | ICD-10-CM | POA: Diagnosis not present

## 2017-04-22 DIAGNOSIS — D509 Iron deficiency anemia, unspecified: Secondary | ICD-10-CM | POA: Diagnosis not present

## 2017-04-25 ENCOUNTER — Encounter (HOSPITAL_COMMUNITY): Payer: Self-pay

## 2017-04-25 ENCOUNTER — Emergency Department (HOSPITAL_COMMUNITY)
Admission: EM | Admit: 2017-04-25 | Discharge: 2017-04-25 | Disposition: A | Discharge status: Home / Self Care | Payer: Medicare Other | Attending: Emergency Medicine | Admitting: Emergency Medicine

## 2017-04-25 ENCOUNTER — Other Ambulatory Visit: Payer: Self-pay

## 2017-04-25 DIAGNOSIS — Z79899 Other long term (current) drug therapy: Secondary | ICD-10-CM | POA: Insufficient documentation

## 2017-04-25 DIAGNOSIS — R030 Elevated blood-pressure reading, without diagnosis of hypertension: Secondary | ICD-10-CM | POA: Diagnosis not present

## 2017-04-25 DIAGNOSIS — I120 Hypertensive chronic kidney disease with stage 5 chronic kidney disease or end stage renal disease: Secondary | ICD-10-CM | POA: Diagnosis not present

## 2017-04-25 DIAGNOSIS — D509 Iron deficiency anemia, unspecified: Secondary | ICD-10-CM | POA: Diagnosis not present

## 2017-04-25 DIAGNOSIS — N2581 Secondary hyperparathyroidism of renal origin: Secondary | ICD-10-CM | POA: Diagnosis not present

## 2017-04-25 DIAGNOSIS — L89112 Pressure ulcer of right upper back, stage 2: Secondary | ICD-10-CM | POA: Diagnosis not present

## 2017-04-25 DIAGNOSIS — Z87891 Personal history of nicotine dependence: Secondary | ICD-10-CM | POA: Diagnosis not present

## 2017-04-25 DIAGNOSIS — M549 Dorsalgia, unspecified: Secondary | ICD-10-CM | POA: Diagnosis present

## 2017-04-25 DIAGNOSIS — N186 End stage renal disease: Secondary | ICD-10-CM | POA: Diagnosis not present

## 2017-04-25 DIAGNOSIS — E1122 Type 2 diabetes mellitus with diabetic chronic kidney disease: Secondary | ICD-10-CM | POA: Diagnosis not present

## 2017-04-25 DIAGNOSIS — E119 Type 2 diabetes mellitus without complications: Secondary | ICD-10-CM | POA: Diagnosis not present

## 2017-04-25 DIAGNOSIS — D631 Anemia in chronic kidney disease: Secondary | ICD-10-CM | POA: Diagnosis not present

## 2017-04-25 LAB — CBC
HCT: 36 % (ref 36.0–46.0)
Hemoglobin: 12.2 g/dL (ref 12.0–15.0)
MCH: 31 pg (ref 26.0–34.0)
MCHC: 33.9 g/dL (ref 30.0–36.0)
MCV: 91.4 fL (ref 78.0–100.0)
Platelets: 114 10*3/uL — ABNORMAL LOW (ref 150–400)
RBC: 3.94 MIL/uL (ref 3.87–5.11)
RDW: 16.2 % — ABNORMAL HIGH (ref 11.5–15.5)
WBC: 3 10*3/uL — ABNORMAL LOW (ref 4.0–10.5)

## 2017-04-25 LAB — BASIC METABOLIC PANEL
ANION GAP: 11 (ref 5–15)
BUN: 9 mg/dL (ref 6–20)
CHLORIDE: 102 mmol/L (ref 101–111)
CO2: 25 mmol/L (ref 22–32)
Calcium: 8.5 mg/dL — ABNORMAL LOW (ref 8.9–10.3)
Creatinine, Ser: 1.53 mg/dL — ABNORMAL HIGH (ref 0.44–1.00)
GFR calc Af Amer: 35 mL/min — ABNORMAL LOW (ref >60)
GFR calc non Af Amer: 31 mL/min — ABNORMAL LOW (ref >60)
Glucose, Bld: 61 mg/dL — ABNORMAL LOW (ref 65–99)
POTASSIUM: 3.7 mmol/L (ref 3.5–5.1)
SODIUM: 138 mmol/L (ref 135–145)

## 2017-04-25 MED ORDER — COLLAGENASE 250 UNIT/GM EX OINT
TOPICAL_OINTMENT | Freq: Every day | CUTANEOUS | Status: DC
  Filled 2017-04-25: qty 30

## 2017-04-25 NOTE — ED Provider Notes (Signed)
[facility] EMERGENCY DEPARTMENT Provider Note   CSN: [id number] Arrival date & time: 04/25/17  1418     History   Chief Complaint Chief Complaint  Patient presents with  . Back Pain  . Vascular Access Problem    HPI [name] is a 84 y.o. female.  HPI Patient presents to the emergency for evaluation of a wound on her back as well as possible vaginal bleeding.  Patient lives at home.  She has a caregiver that watches her.  Patient's family members state that the caregiver told her they noticed some possible vaginal bleeding today and they were checking her diaper. The caregiver noticed spots when she was being changed in the am.  Pt has not noticed any vaginal bleeding. Family also noticed a small wound on her back a couple of days ago.  Appears to have been a scratch when she slid out of the wheelchair.  Family is concerned that there was an odor and drainage to it.  No known fever.  No vomiting or diarrhea.  Patient is on dialysis and states she has not missed any sessions.   Past Medical History:  Diagnosis Date  . Anemia   . Arthritis   . Bilateral breast cancer ([place])   . DM (diabetes mellitus) ([place])   . Dyslipidemia   . ESRD (end stage renal disease) on dialysis ([place])    a. [place]: MWF (07/10/2014)  . Hepatitis C   . Hyperparathyroidism   . Hypertension   . PVD (peripheral vascular disease) ([place])    a. s/p peripheral angiogram on 07/10/14 with no PCI and cont med Rx  . Vascular disease     Patient Active Problem List   Diagnosis Date Noted  . Pressure injury of skin 08/23/2016  . Cellulitis and abscess of trunk 08/21/2016  . S/P AKA (above knee amputation) bilateral ([place]) 04/22/2016  . Protein-calorie malnutrition, severe 11/20/2015  . Osteomyelitis ([place]) 11/17/2015  . Pressure ulcer 11/16/2015  . Absolute anemia 11/16/2015  . Acute post-hemorrhagic anemia 11/15/2015  . Bleeding from dialysis shunt ([place]) 11/15/2015  . Incontinence of feces  11/15/2015  . ESRD (end stage renal disease) on dialysis ([place])   . Hepatitis C   . Hypertension   . Dyslipidemia   . PVD (peripheral vascular disease) ([place]) 07/02/2014  . End stage renal disease ([place]) 08/08/2013  . Postoperative wound hematoma 08/08/2013  . Breast cancer of lower-outer quadrant of right female breast ([place]) 07/19/2013    Past Surgical History:  Procedure Laterality Date  . ABDOMINAL HYSTERECTOMY    . AMPUTATION Bilateral 11/21/2015   Procedure: AMPUTATION ABOVE KNEE;  Surgeon: Newt Minion, MD;  Location: [facility];  Service: Orthopedics;  Laterality: Bilateral;  . APPENDECTOMY    . ARTERIOVENOUS GRAFT PLACEMENT Left 03/20/09   "had right arm previously but couldn't use it anymore"  . BACK SURGERY    . BREAST BIOPSY Bilateral   . BREAST BIOPSY Right 08/08/2013   Procedure: Evacuation Hematoma Right Chest;  Surgeon: [name], MD;  Location: [facility];  Service: General;  Laterality: Right;  . CATARACT EXTRACTION Right   . DG AV DIALYSIS GRAFT DECLOT OR Left 01/27/11, 02/15/11, 03/15/11, 10/13/11   lua  . EVACUATION BREAST HEMATOMA  2001; 2015   left; right  . INSERTION OF DIALYSIS CATHETER Right 11/15/2015   Procedure: INSERTION OF Right Femoral  DIALYSIS CATHETER.;  Surgeon: Rosetta Posner, MD;  Location: [facility];  Service: Vascular;  Laterality:  Right;  Marland Kitchen MASTECTOMY Left 1970's?  Marland Kitchen MASTECTOMY COMPLETE / SIMPLE Right 08/07/2013  . MEDIAN NERVE REPAIR Left    "decompression"  . PERIPHERAL VASCULAR CATHETERIZATION N/A 07/10/2014   Procedure: Abdominal Aortogram;  Surgeon: Wellington Hampshire, MD;  Location: [facility] INVASIVE CV LAB CUPID;  Service: Cardiovascular;  Laterality: N/A;  . POSTERIOR FUSION CERVICAL SPINE     "have 6 screws"  . THROMBECTOMY AND REVISION OF ARTERIOVENTOUS (AV) GORETEX  GRAFT Left 04/18/2009  . THROMBECTOMY AND REVISION OF ARTERIOVENTOUS (AV) GORETEX  GRAFT Left 11/15/2015   Procedure: Thrombectomy and Revision of Left ARm AV Gortex Graft.;  Surgeon: Rosetta Posner,  MD;  Location: [facility];  Service: Vascular;  Laterality: Left;  . TOE AMPUTATION Right    1,2nd toes  . TONSILLECTOMY    . TOTAL MASTECTOMY Right 08/07/2013   Procedure: RIGHT TOTAL MASTECTOMY;  Surgeon: [name], MD;  Location: [facility];  Service: General;  Laterality: Right;  . UTERINE FIBROID SURGERY      OB History    No data available       Home Medications    Prior to Admission medications   Medication Sig Start Date End Date Taking? Authorizing Provider  Amino Acids-Protein Hydrolys (FEEDING SUPPLEMENT, PRO-STAT SUGAR FREE 64,) LIQD Take 30 mLs by mouth 2 (two) times daily. Patient not taking: Reported on 03/21/2017 08/26/16   Theodis Blaze, MD  anastrozole (ARIMIDEX) 1 MG tablet take 1 tablet by mouth once daily 07/04/16   Wyatt Portela, MD  B Complex-C-Folic Acid (DIALYVITE TABLET) TABS Take 1 tablet by mouth daily after supper. 10/31/15   [provider]  calcitRIOL (ROCALTROL) 0.25 MCG capsule Take 3 capsules (0.75 mcg total) by mouth every Monday, Wednesday, and Friday with hemodialysis. 08/27/16   Theodis Blaze, MD  cinacalcet [facility]) 30 MG tablet Take 2 tablets (60 mg total) by mouth every Monday, Wednesday, and Friday with hemodialysis. 08/27/16   Theodis Blaze, MD  collagenase (SANTYL) ointment Apply topically daily. Patient taking differently: Apply 1 application topically daily.  08/26/16   Theodis Blaze, MD  ENSURE (ENSURE) Take 237 mLs by mouth 2 (two) times daily between meals.    [provider]  lanthanum (FOSRENOL) 1000 MG chewable tablet Chew 1,000 mg by mouth daily after supper.     [provider]  metoprolol succinate (TOPROL XL) 25 MG 24 hr tablet Take 0.5 tablets (12.5 mg total) by mouth daily. Patient taking differently: Take 25 mg by mouth at bedtime.  08/27/16   Theodis Blaze, MD  Nutritional Supplements (FEEDING SUPPLEMENT, NEPRO CARB STEADY,) LIQD Take 237 mLs by mouth 2 (two) times daily between meals. Patient not  taking: Reported on 03/21/2017 11/24/15   Elgergawy, Silver Huguenin, MD  oxyCODONE (OXY IR/ROXICODONE) 5 MG immediate release tablet Take 1 tablet (5 mg total) by mouth every 4 (four) hours as needed for moderate pain. Patient not taking: Reported on 03/21/2017 08/26/16   Theodis Blaze, MD    Family History Family History  Problem Relation Age of Onset  . Cancer Mother   . Hypertension Mother     Social History Social History   Tobacco Use  . Smoking status: Former Smoker    Types: Cigarettes  . Smokeless tobacco: Never Used  . Tobacco comment: "stopped smoking in 1969"  Substance Use Topics  . Alcohol use: Yes    Alcohol/week: 0.0 oz    Comment: "no alcohol since 1969"  . Drug  use: No     Allergies   Aspirin   Review of Systems Review of Systems  All other systems reviewed and are negative.    Physical Exam Updated Vital Signs BP (!) 133/53 (BP Location: Right Arm)   Pulse 74   Temp 98.2 F (36.8 C) (Oral)   Resp 16   SpO2 98%   Physical Exam  Constitutional: No distress.  elderly, frail  HENT:  Head: Normocephalic and atraumatic.  Right Ear: External ear normal.  Left Ear: External ear normal.  Eyes: Conjunctivae are normal. Right eye exhibits no discharge. Left eye exhibits no discharge. No scleral icterus.  Neck: Neck supple. No tracheal deviation present.  Cardiovascular: Normal rate, regular rhythm and intact distal pulses.  Pulmonary/Chest: Effort normal and breath sounds normal. No stridor. No respiratory distress. She has no wheezes. She has no rales.  Abdominal: Soft. Bowel sounds are normal. She exhibits no distension. There is no tenderness. There is no rebound and no guarding.  Genitourinary:  Genitourinary Comments: No external lesions, no bleeding noted, no blood in the introitus; no blood noted around the perianal area  Musculoskeletal: She exhibits no edema or tenderness.  Status post bilateral above-the-knee amputations  Neurological: She is  alert. She has normal strength. No cranial nerve deficit (no facial droop, extraocular movements intact, no slurred speech) or sensory deficit. She exhibits normal muscle tone. She displays no seizure activity. Coordination normal.  Skin: Skin is warm and dry. No rash noted. She is not diaphoretic.  Small oval wound mid thoracic back, approx 1.5 cm, no surrounding erythema  Psychiatric: She has a normal mood and affect.  Nursing note and vitals reviewed.    ED Treatments / Results  Labs (all labs ordered are listed, but only abnormal results are displayed) Labs Reviewed  CBC - Abnormal; Notable for the following components:      Result Value   WBC 3.0 (*)    RDW 16.2 (*)    Platelets 114 (*)    All other components within normal limits  BASIC METABOLIC PANEL - Abnormal; Notable for the following components:   Glucose, Bld 61 (*)    Creatinine, Ser 1.53 (*)    Calcium 8.5 (*)    GFR calc non Af Amer 31 (*)    GFR calc Af Amer 35 (*)    All other components within normal limits     Procedures Procedures (including critical care time)  Medications Ordered in ED Medications  collagenase (SANTYL) ointment (not administered)     Initial Impression / Assessment and Plan / ED Course  I have reviewed the triage vital signs and the nursing notes.  Pertinent labs & imaging results that were available during my care of the patient were reviewed by me and considered in my medical decision making (see chart for details).    Patient presented to the emergency room for evaluation of a wound as well as possible vaginal bleeding.  Patient has a small wound on her back consistent with a decubitus ulcer.  Wound care nurse kindly evaluated the patient here in the emergency room.  She recommended Santyl ointment.  No signs of infection.  Recommend follow-up as an outpatient.  Patient does not have any evidence of bleeding on physical exam.  Hemoglobin is stable.  She does have thrombocytopenia  but this is increased from previous values.  She also has a chronically low white blood cell count but this is not significant.  Base metabolic panel also shows  chronic renal insufficiency but I do not think this is clinically significant.  Patient was given something to eat considering her borderline low glucose.  Patient is otherwise stable for discharge  Final Clinical Impressions(s) / ED Diagnoses   Final diagnoses:  Pressure injury of right upper back, stage 2    ED Discharge Orders    None       Dorie Rank, MD 04/25/17 1750

## 2017-04-25 NOTE — ED Triage Notes (Addendum)
Pt from home, pt fell out of her wheelchair 2 weeks ago and scratched an area between her shoulder blades. Pt's caregiver states that a bandaid and bacitracin was placed over scratch but has not been replaced at home since by the home health CNA. Family concerned that pt has bed sore now. Home health CNA noted that pt had possible vaginal bleeding today but pt denies this. VS 160/60, HR 66, CBG 93, RR 14. Axox4 but pt seems "tired" Pt is on dialysis but unsure what days and EMS was unable to figure when pt goes and if she went today or not but states "I went"

## 2017-04-25 NOTE — Consult Note (Addendum)
[facility] Nurse wound consult note Reason for Consult: Consult requested for middle back wound Wound type: Unstageable pressure injury  Pressure Injury POA: Yes Measurement: .8X.8cm Wound bed: 100% yellow slough Drainage (amount, consistency, odor) small amt tan drainage, no odor Periwound: intact skin surrounding Dressing procedure/placement/frequency: Santyl ointment for enzymatic debridement of nonviable tissue.   No family present to discuss plan of care when location was assessed. Please re-consult if further assistance is needed.  Thank-you,  Julien Girt MSN, [place], [place], [place], [place]

## 2017-04-25 NOTE — ED Notes (Signed)
SN performed wd care as ordered to mid back: cleansed with NS, patted dry, applied santyl, covered with wet to dry drsg; secured with foam adhesive. Pt tolerated well

## 2017-04-25 NOTE — Discharge Instructions (Signed)
Use the santyl ointment daily for the next week, only apply to the scab tissue, not the healthy tissue, switch to bacitracin ointment

## 2017-04-25 NOTE — ED Notes (Signed)
Pt discharged from ED; instructions provided; Pt encouraged to return to ED if symptoms worsen and to f/u with PCP; Pt verbalized understanding of all instructions 

## 2017-04-27 DIAGNOSIS — D631 Anemia in chronic kidney disease: Secondary | ICD-10-CM | POA: Diagnosis not present

## 2017-04-27 DIAGNOSIS — N186 End stage renal disease: Secondary | ICD-10-CM | POA: Diagnosis not present

## 2017-04-27 DIAGNOSIS — N2581 Secondary hyperparathyroidism of renal origin: Secondary | ICD-10-CM | POA: Diagnosis not present

## 2017-04-27 DIAGNOSIS — E119 Type 2 diabetes mellitus without complications: Secondary | ICD-10-CM | POA: Diagnosis not present

## 2017-04-27 DIAGNOSIS — D509 Iron deficiency anemia, unspecified: Secondary | ICD-10-CM | POA: Diagnosis not present

## 2017-04-29 DIAGNOSIS — D509 Iron deficiency anemia, unspecified: Secondary | ICD-10-CM | POA: Diagnosis not present

## 2017-04-29 DIAGNOSIS — D631 Anemia in chronic kidney disease: Secondary | ICD-10-CM | POA: Diagnosis not present

## 2017-04-29 DIAGNOSIS — N186 End stage renal disease: Secondary | ICD-10-CM | POA: Diagnosis not present

## 2017-04-29 DIAGNOSIS — N2581 Secondary hyperparathyroidism of renal origin: Secondary | ICD-10-CM | POA: Diagnosis not present

## 2017-04-29 DIAGNOSIS — E119 Type 2 diabetes mellitus without complications: Secondary | ICD-10-CM | POA: Diagnosis not present

## 2017-05-02 DIAGNOSIS — E119 Type 2 diabetes mellitus without complications: Secondary | ICD-10-CM | POA: Diagnosis not present

## 2017-05-02 DIAGNOSIS — D509 Iron deficiency anemia, unspecified: Secondary | ICD-10-CM | POA: Diagnosis not present

## 2017-05-02 DIAGNOSIS — D631 Anemia in chronic kidney disease: Secondary | ICD-10-CM | POA: Diagnosis not present

## 2017-05-02 DIAGNOSIS — N2581 Secondary hyperparathyroidism of renal origin: Secondary | ICD-10-CM | POA: Diagnosis not present

## 2017-05-02 DIAGNOSIS — N186 End stage renal disease: Secondary | ICD-10-CM | POA: Diagnosis not present

## 2017-05-04 DIAGNOSIS — D509 Iron deficiency anemia, unspecified: Secondary | ICD-10-CM | POA: Diagnosis not present

## 2017-05-04 DIAGNOSIS — E119 Type 2 diabetes mellitus without complications: Secondary | ICD-10-CM | POA: Diagnosis not present

## 2017-05-04 DIAGNOSIS — D631 Anemia in chronic kidney disease: Secondary | ICD-10-CM | POA: Diagnosis not present

## 2017-05-04 DIAGNOSIS — N2581 Secondary hyperparathyroidism of renal origin: Secondary | ICD-10-CM | POA: Diagnosis not present

## 2017-05-04 DIAGNOSIS — N186 End stage renal disease: Secondary | ICD-10-CM | POA: Diagnosis not present

## 2017-05-05 ENCOUNTER — Telehealth: Payer: Self-pay | Admitting: Oncology

## 2017-05-05 ENCOUNTER — Inpatient Hospital Stay: Payer: Medicare Other | Attending: Oncology | Admitting: Oncology

## 2017-05-05 VITALS — BP 130/47 | HR 67 | Temp 97.8°F | Resp 18 | Ht 63.0 in | Wt 118.4 lb

## 2017-05-05 DIAGNOSIS — Z992 Dependence on renal dialysis: Secondary | ICD-10-CM | POA: Diagnosis not present

## 2017-05-05 DIAGNOSIS — Z9013 Acquired absence of bilateral breasts and nipples: Secondary | ICD-10-CM | POA: Diagnosis not present

## 2017-05-05 DIAGNOSIS — Z170 Estrogen receptor positive status [ER+]: Secondary | ICD-10-CM | POA: Diagnosis not present

## 2017-05-05 DIAGNOSIS — C50511 Malignant neoplasm of lower-outer quadrant of right female breast: Secondary | ICD-10-CM

## 2017-05-05 DIAGNOSIS — M545 Low back pain: Secondary | ICD-10-CM | POA: Diagnosis not present

## 2017-05-05 DIAGNOSIS — Z79899 Other long term (current) drug therapy: Secondary | ICD-10-CM | POA: Diagnosis not present

## 2017-05-05 DIAGNOSIS — Z79811 Long term (current) use of aromatase inhibitors: Secondary | ICD-10-CM | POA: Diagnosis not present

## 2017-05-05 DIAGNOSIS — N189 Chronic kidney disease, unspecified: Secondary | ICD-10-CM

## 2017-05-05 DIAGNOSIS — Z886 Allergy status to analgesic agent status: Secondary | ICD-10-CM | POA: Diagnosis not present

## 2017-05-05 NOTE — Telephone Encounter (Signed)
Appointments scheduled AVS/Calendar Printed per 2/28 los

## 2017-05-05 NOTE — Progress Notes (Signed)
Hematology and Oncology Follow Up Visit  [name] [id number] 11-Dec-1932 84 y.o. 05/05/2017 10:04 AM [name], MDGoldsborough, Lambert Keto, MD   Principle Diagnosis: 84 year old woman with the invasive ductal carcinoma of the right breast diagnosed in April 2015. Tumor was found to be ER positive, PR negative HER-2 negative.   Prior Therapy:  She underwent a right total mastectomy under the care of Dr. Dalbert Batman on 08/07/2013. The pathology confirmed the presence of 3.5 cm invasive ductal carcinoma grade I/III with negative surgical margins. She was ER positive PR negative. With Ki-67 of 9%. The pathological staging was pT2 NX.  She is also status post left mastectomy done in 1988.  Current therapy: Arimidex 1 mg daily started in July 2015.  Interim History:  [name] is here for a follow-up visit.  Since last visit, she has been seen in the emergency department on a few occasions due to vascular access issues.  She continues to receive hemodialysis without complications but does report lower back pain and pelvic area from sitting down for an extended period of time.  She denies any complications related to Arimidex.  She denies any arthralgias, myalgias or GI toxicities.  Her appetite remained reasonable and her quality of life is unchanged.  She denied any headaches, blurred vision or alteration in mental status.  She does not report any fevers or chills or sweats. Has not reported any weight loss or appetite changes. She does not report any chest pain shortness of breath cough or hemoptysis. Does not report any palpitation orthopnea or PND. Does not report any nausea or vomiting or abdominal pain. Does not report any lymphadenopathy or petechiae.  He denies any skin rashes or lesions. Rest of the review of system is unremarkable.  Medications: I have reviewed the patient's current medications.  Current Outpatient Medications  Medication Sig Dispense Refill  . Amino Acids-Protein  Hydrolys (FEEDING SUPPLEMENT, PRO-STAT SUGAR FREE 64,) LIQD Take 30 mLs by mouth 2 (two) times daily. (Patient not taking: Reported on 03/21/2017) 900 mL 0  . anastrozole (ARIMIDEX) 1 MG tablet take 1 tablet by mouth once daily 30 tablet 6  . B Complex-C-Folic Acid (DIALYVITE TABLET) TABS Take 1 tablet by mouth daily after supper.  1  . calcitRIOL (ROCALTROL) 0.25 MCG capsule Take 3 capsules (0.75 mcg total) by mouth every Monday, Wednesday, and Friday with hemodialysis.    Marland Kitchen cinacalcet (SENSIPAR) 30 MG tablet Take 2 tablets (60 mg total) by mouth every Monday, Wednesday, and Friday with hemodialysis. 60 tablet 0  . collagenase (SANTYL) ointment Apply topically daily. (Patient taking differently: Apply 1 application topically daily. ) 15 g 0  . ENSURE (ENSURE) Take 237 mLs by mouth 2 (two) times daily between meals.    Marland Kitchen lanthanum (FOSRENOL) 1000 MG chewable tablet Chew 1,000 mg by mouth daily after supper.     . metoprolol succinate (TOPROL XL) 25 MG 24 hr tablet Take 0.5 tablets (12.5 mg total) by mouth daily. (Patient taking differently: Take 25 mg by mouth at bedtime. ) 30 tablet 0  . Nutritional Supplements (FEEDING SUPPLEMENT, NEPRO CARB STEADY,) LIQD Take 237 mLs by mouth 2 (two) times daily between meals. (Patient not taking: Reported on 03/21/2017)  0  . oxyCODONE (OXY IR/ROXICODONE) 5 MG immediate release tablet Take 1 tablet (5 mg total) by mouth every 4 (four) hours as needed for moderate pain. (Patient not taking: Reported on 03/21/2017) 30 tablet 0   No current facility-administered medications for this visit.  Allergies:  Allergies  Allergen Reactions  . Aspirin Other (See Comments)    NO BLOOD THINNERS OF ANY KIND-bleeding events    Past Medical History, Surgical history, Social history, and Family History were reviewed and updated.  Physical Exam:  ECOG: 2 General appearance: Comfortable appearing woman without distress. Head: Normocephalic, without obvious abnormality  atraumatic. Oropharynx: His membranes are moist and pink. Eyes: No scleral icterus.  Pupils are equal and round reactive to light. Lymph nodes: Cervical, supraclavicular, and axillary nodes normal. Heart regular rate and rhythm without murmurs rubs or gallops. Lung: Faythe Dingwall to auscultation without rhonchi or wheezes.  No dullness to percussion. Abdomin: Soft, nontender without any rebound or guarding.  Good bowel sounds. Muscular skeletal: No joint deformity or effusion.  Bilateral amputation noted. Skin: No rashes or lesions. Neurological: No motor or sensory deficits.   Lab Results: Lab Results  Component Value Date   WBC 3.0 (L) 04/25/2017   HGB 12.2 04/25/2017   HCT 36.0 04/25/2017   MCV 91.4 04/25/2017   PLT 114 (L) 04/25/2017     Chemistry      Component Value Date/Time   NA 138 04/25/2017 1557   NA 139 09/04/2013 1025   K 3.7 04/25/2017 1557   K 4.0 09/04/2013 1025   CL 102 04/25/2017 1557   CO2 25 04/25/2017 1557   CO2 31 (H) 09/04/2013 1025   BUN 9 04/25/2017 1557   BUN 49.9 (H) 09/04/2013 1025   CREATININE 1.53 (H) 04/25/2017 1557   CREATININE 6.2 (HH) 09/04/2013 1025      Component Value Date/Time   CALCIUM 8.5 (L) 04/25/2017 1557   CALCIUM 10.4 09/04/2013 1025   ALKPHOS 72 08/21/2016 1052   ALKPHOS 152 (H) 09/04/2013 1025   AST 28 08/21/2016 1052   AST 43 (H) 09/04/2013 1025   ALT 15 08/21/2016 1052   ALT 21 09/04/2013 1025   BILITOT 0.4 08/21/2016 1052   BILITOT 0.54 09/04/2013 1025       Impression and Plan:   84 year old woman with the following issues:  1.  T2 N0 invasive ductal carcinoma of the right breast after presenting with 3.5 cm mass. She is status post right mastectomy.  Her tumor is ER positive HER-2 negative.   She remains on adjuvant hormone therapy since the time of diagnosis after surgery.  She has no complications noted at this time.  Risks and benefits of continuing Arimidex to complete 5 years of therapy was reviewed today.   The rationale for extending and beyond that was also discussed and will be reviewed again in 2020.  For the time being the plan is to continue with the same dose and schedule and reassess extending Arimidex beyond July 2020 at the time.  2. Renal failure: She remains hemodialysis dependent.  No recent hospitalizations.  3. Anemia: Her hemoglobin has been within normal range obtained on 04/25/2017 was personally reviewed today.  4. Follow-up: Will be in 6 months.   15  minutes was spent with the patient face-to-face today.  More than 50% of time was dedicated to patient counseling, education and coordination of her care.  Zola Button, MD 2/28/201910:04 AM

## 2017-05-06 DIAGNOSIS — E119 Type 2 diabetes mellitus without complications: Secondary | ICD-10-CM | POA: Diagnosis not present

## 2017-05-06 DIAGNOSIS — N2581 Secondary hyperparathyroidism of renal origin: Secondary | ICD-10-CM | POA: Diagnosis not present

## 2017-05-06 DIAGNOSIS — Z992 Dependence on renal dialysis: Secondary | ICD-10-CM | POA: Diagnosis not present

## 2017-05-06 DIAGNOSIS — N186 End stage renal disease: Secondary | ICD-10-CM | POA: Diagnosis not present

## 2017-05-06 DIAGNOSIS — E1129 Type 2 diabetes mellitus with other diabetic kidney complication: Secondary | ICD-10-CM | POA: Diagnosis not present

## 2017-05-06 DIAGNOSIS — D631 Anemia in chronic kidney disease: Secondary | ICD-10-CM | POA: Diagnosis not present

## 2017-05-06 DIAGNOSIS — D509 Iron deficiency anemia, unspecified: Secondary | ICD-10-CM | POA: Diagnosis not present

## 2017-05-09 DIAGNOSIS — D631 Anemia in chronic kidney disease: Secondary | ICD-10-CM | POA: Diagnosis not present

## 2017-05-09 DIAGNOSIS — E119 Type 2 diabetes mellitus without complications: Secondary | ICD-10-CM | POA: Diagnosis not present

## 2017-05-09 DIAGNOSIS — N186 End stage renal disease: Secondary | ICD-10-CM | POA: Diagnosis not present

## 2017-05-09 DIAGNOSIS — D509 Iron deficiency anemia, unspecified: Secondary | ICD-10-CM | POA: Diagnosis not present

## 2017-05-09 DIAGNOSIS — N2581 Secondary hyperparathyroidism of renal origin: Secondary | ICD-10-CM | POA: Diagnosis not present

## 2017-05-11 DIAGNOSIS — N2581 Secondary hyperparathyroidism of renal origin: Secondary | ICD-10-CM | POA: Diagnosis not present

## 2017-05-11 DIAGNOSIS — N186 End stage renal disease: Secondary | ICD-10-CM | POA: Diagnosis not present

## 2017-05-11 DIAGNOSIS — E119 Type 2 diabetes mellitus without complications: Secondary | ICD-10-CM | POA: Diagnosis not present

## 2017-05-11 DIAGNOSIS — D509 Iron deficiency anemia, unspecified: Secondary | ICD-10-CM | POA: Diagnosis not present

## 2017-05-11 DIAGNOSIS — D631 Anemia in chronic kidney disease: Secondary | ICD-10-CM | POA: Diagnosis not present

## 2017-05-12 DIAGNOSIS — T82858A Stenosis of vascular prosthetic devices, implants and grafts, initial encounter: Secondary | ICD-10-CM | POA: Diagnosis not present

## 2017-05-12 DIAGNOSIS — N186 End stage renal disease: Secondary | ICD-10-CM | POA: Diagnosis not present

## 2017-05-12 DIAGNOSIS — Z992 Dependence on renal dialysis: Secondary | ICD-10-CM | POA: Diagnosis not present

## 2017-05-12 DIAGNOSIS — I871 Compression of vein: Secondary | ICD-10-CM | POA: Diagnosis not present

## 2017-05-13 DIAGNOSIS — N186 End stage renal disease: Secondary | ICD-10-CM | POA: Diagnosis not present

## 2017-05-13 DIAGNOSIS — D631 Anemia in chronic kidney disease: Secondary | ICD-10-CM | POA: Diagnosis not present

## 2017-05-13 DIAGNOSIS — E119 Type 2 diabetes mellitus without complications: Secondary | ICD-10-CM | POA: Diagnosis not present

## 2017-05-13 DIAGNOSIS — N2581 Secondary hyperparathyroidism of renal origin: Secondary | ICD-10-CM | POA: Diagnosis not present

## 2017-05-13 DIAGNOSIS — D509 Iron deficiency anemia, unspecified: Secondary | ICD-10-CM | POA: Diagnosis not present

## 2017-05-16 DIAGNOSIS — N186 End stage renal disease: Secondary | ICD-10-CM | POA: Diagnosis not present

## 2017-05-16 DIAGNOSIS — D631 Anemia in chronic kidney disease: Secondary | ICD-10-CM | POA: Diagnosis not present

## 2017-05-16 DIAGNOSIS — E119 Type 2 diabetes mellitus without complications: Secondary | ICD-10-CM | POA: Diagnosis not present

## 2017-05-16 DIAGNOSIS — N2581 Secondary hyperparathyroidism of renal origin: Secondary | ICD-10-CM | POA: Diagnosis not present

## 2017-05-16 DIAGNOSIS — D509 Iron deficiency anemia, unspecified: Secondary | ICD-10-CM | POA: Diagnosis not present

## 2017-05-18 DIAGNOSIS — I999 Unspecified disorder of circulatory system: Secondary | ICD-10-CM | POA: Diagnosis not present

## 2017-05-18 DIAGNOSIS — Z452 Encounter for adjustment and management of vascular access device: Secondary | ICD-10-CM | POA: Diagnosis not present

## 2017-05-18 DIAGNOSIS — I10 Essential (primary) hypertension: Secondary | ICD-10-CM | POA: Diagnosis not present

## 2017-05-18 DIAGNOSIS — I871 Compression of vein: Secondary | ICD-10-CM | POA: Diagnosis not present

## 2017-05-18 DIAGNOSIS — N186 End stage renal disease: Secondary | ICD-10-CM | POA: Diagnosis not present

## 2017-05-18 DIAGNOSIS — J9600 Acute respiratory failure, unspecified whether with hypoxia or hypercapnia: Secondary | ICD-10-CM | POA: Diagnosis not present

## 2017-05-18 DIAGNOSIS — G8911 Acute pain due to trauma: Secondary | ICD-10-CM | POA: Diagnosis not present

## 2017-05-18 DIAGNOSIS — T82868A Thrombosis of vascular prosthetic devices, implants and grafts, initial encounter: Secondary | ICD-10-CM | POA: Diagnosis not present

## 2017-05-18 DIAGNOSIS — Z992 Dependence on renal dialysis: Secondary | ICD-10-CM | POA: Diagnosis not present

## 2017-05-20 DIAGNOSIS — E119 Type 2 diabetes mellitus without complications: Secondary | ICD-10-CM | POA: Diagnosis not present

## 2017-05-20 DIAGNOSIS — D631 Anemia in chronic kidney disease: Secondary | ICD-10-CM | POA: Diagnosis not present

## 2017-05-20 DIAGNOSIS — N2581 Secondary hyperparathyroidism of renal origin: Secondary | ICD-10-CM | POA: Diagnosis not present

## 2017-05-20 DIAGNOSIS — N186 End stage renal disease: Secondary | ICD-10-CM | POA: Diagnosis not present

## 2017-05-20 DIAGNOSIS — D509 Iron deficiency anemia, unspecified: Secondary | ICD-10-CM | POA: Diagnosis not present

## 2017-05-23 DIAGNOSIS — D509 Iron deficiency anemia, unspecified: Secondary | ICD-10-CM | POA: Diagnosis not present

## 2017-05-23 DIAGNOSIS — E119 Type 2 diabetes mellitus without complications: Secondary | ICD-10-CM | POA: Diagnosis not present

## 2017-05-23 DIAGNOSIS — N186 End stage renal disease: Secondary | ICD-10-CM | POA: Diagnosis not present

## 2017-05-23 DIAGNOSIS — D631 Anemia in chronic kidney disease: Secondary | ICD-10-CM | POA: Diagnosis not present

## 2017-05-23 DIAGNOSIS — N2581 Secondary hyperparathyroidism of renal origin: Secondary | ICD-10-CM | POA: Diagnosis not present

## 2017-05-25 DIAGNOSIS — D631 Anemia in chronic kidney disease: Secondary | ICD-10-CM | POA: Diagnosis not present

## 2017-05-25 DIAGNOSIS — N2581 Secondary hyperparathyroidism of renal origin: Secondary | ICD-10-CM | POA: Diagnosis not present

## 2017-05-25 DIAGNOSIS — E119 Type 2 diabetes mellitus without complications: Secondary | ICD-10-CM | POA: Diagnosis not present

## 2017-05-25 DIAGNOSIS — D509 Iron deficiency anemia, unspecified: Secondary | ICD-10-CM | POA: Diagnosis not present

## 2017-05-25 DIAGNOSIS — N186 End stage renal disease: Secondary | ICD-10-CM | POA: Diagnosis not present

## 2017-05-27 DIAGNOSIS — N2581 Secondary hyperparathyroidism of renal origin: Secondary | ICD-10-CM | POA: Diagnosis not present

## 2017-05-27 DIAGNOSIS — D509 Iron deficiency anemia, unspecified: Secondary | ICD-10-CM | POA: Diagnosis not present

## 2017-05-27 DIAGNOSIS — D631 Anemia in chronic kidney disease: Secondary | ICD-10-CM | POA: Diagnosis not present

## 2017-05-27 DIAGNOSIS — N186 End stage renal disease: Secondary | ICD-10-CM | POA: Diagnosis not present

## 2017-05-27 DIAGNOSIS — E119 Type 2 diabetes mellitus without complications: Secondary | ICD-10-CM | POA: Diagnosis not present

## 2017-05-30 ENCOUNTER — Other Ambulatory Visit: Payer: Self-pay

## 2017-05-30 ENCOUNTER — Encounter (HOSPITAL_COMMUNITY): Payer: Self-pay

## 2017-05-30 ENCOUNTER — Emergency Department (HOSPITAL_COMMUNITY): Payer: Medicare Other

## 2017-05-30 ENCOUNTER — Emergency Department (HOSPITAL_COMMUNITY)
Admission: EM | Admit: 2017-05-30 | Discharge: 2017-05-30 | Disposition: A | Discharge status: Home / Self Care | Payer: Medicare Other | Attending: Emergency Medicine | Admitting: Emergency Medicine

## 2017-05-30 DIAGNOSIS — Y929 Unspecified place or not applicable: Secondary | ICD-10-CM | POA: Diagnosis not present

## 2017-05-30 DIAGNOSIS — S72002A Fracture of unspecified part of neck of left femur, initial encounter for closed fracture: Secondary | ICD-10-CM | POA: Diagnosis not present

## 2017-05-30 DIAGNOSIS — Z87891 Personal history of nicotine dependence: Secondary | ICD-10-CM | POA: Insufficient documentation

## 2017-05-30 DIAGNOSIS — Y999 Unspecified external cause status: Secondary | ICD-10-CM | POA: Diagnosis not present

## 2017-05-30 DIAGNOSIS — Z79899 Other long term (current) drug therapy: Secondary | ICD-10-CM | POA: Insufficient documentation

## 2017-05-30 DIAGNOSIS — N186 End stage renal disease: Secondary | ICD-10-CM | POA: Diagnosis not present

## 2017-05-30 DIAGNOSIS — N2581 Secondary hyperparathyroidism of renal origin: Secondary | ICD-10-CM | POA: Diagnosis not present

## 2017-05-30 DIAGNOSIS — M545 Low back pain: Secondary | ICD-10-CM | POA: Diagnosis not present

## 2017-05-30 DIAGNOSIS — S199XXA Unspecified injury of neck, initial encounter: Secondary | ICD-10-CM | POA: Diagnosis not present

## 2017-05-30 DIAGNOSIS — E1122 Type 2 diabetes mellitus with diabetic chronic kidney disease: Secondary | ICD-10-CM | POA: Insufficient documentation

## 2017-05-30 DIAGNOSIS — I120 Hypertensive chronic kidney disease with stage 5 chronic kidney disease or end stage renal disease: Secondary | ICD-10-CM | POA: Insufficient documentation

## 2017-05-30 DIAGNOSIS — W228XXA Striking against or struck by other objects, initial encounter: Secondary | ICD-10-CM | POA: Insufficient documentation

## 2017-05-30 DIAGNOSIS — M549 Dorsalgia, unspecified: Secondary | ICD-10-CM | POA: Diagnosis not present

## 2017-05-30 DIAGNOSIS — D631 Anemia in chronic kidney disease: Secondary | ICD-10-CM | POA: Diagnosis not present

## 2017-05-30 DIAGNOSIS — E119 Type 2 diabetes mellitus without complications: Secondary | ICD-10-CM | POA: Diagnosis not present

## 2017-05-30 DIAGNOSIS — S3992XA Unspecified injury of lower back, initial encounter: Secondary | ICD-10-CM | POA: Diagnosis not present

## 2017-05-30 DIAGNOSIS — S79912A Unspecified injury of left hip, initial encounter: Secondary | ICD-10-CM | POA: Diagnosis not present

## 2017-05-30 DIAGNOSIS — S79911A Unspecified injury of right hip, initial encounter: Secondary | ICD-10-CM | POA: Diagnosis not present

## 2017-05-30 DIAGNOSIS — S299XXA Unspecified injury of thorax, initial encounter: Secondary | ICD-10-CM | POA: Diagnosis not present

## 2017-05-30 DIAGNOSIS — M546 Pain in thoracic spine: Secondary | ICD-10-CM | POA: Diagnosis not present

## 2017-05-30 DIAGNOSIS — Y939 Activity, unspecified: Secondary | ICD-10-CM | POA: Diagnosis not present

## 2017-05-30 DIAGNOSIS — M25552 Pain in left hip: Secondary | ICD-10-CM | POA: Diagnosis not present

## 2017-05-30 DIAGNOSIS — M542 Cervicalgia: Secondary | ICD-10-CM | POA: Diagnosis not present

## 2017-05-30 DIAGNOSIS — D509 Iron deficiency anemia, unspecified: Secondary | ICD-10-CM | POA: Diagnosis not present

## 2017-05-30 DIAGNOSIS — M25551 Pain in right hip: Secondary | ICD-10-CM | POA: Diagnosis not present

## 2017-05-30 DIAGNOSIS — G8911 Acute pain due to trauma: Secondary | ICD-10-CM | POA: Diagnosis not present

## 2017-05-30 DIAGNOSIS — N179 Acute kidney failure, unspecified: Secondary | ICD-10-CM | POA: Diagnosis not present

## 2017-05-30 MED ORDER — LIDOCAINE 5 % EX PTCH: 1.0000 | MEDICATED_PATCH | CUTANEOUS | 0 refills | Status: AC

## 2017-05-30 MED ORDER — OXYCODONE-ACETAMINOPHEN 5-325 MG PO TABS: 1.0000 | ORAL_TABLET | Freq: Four times a day (QID) | ORAL | 0 refills | Status: AC | PRN

## 2017-05-30 MED ORDER — LIDOCAINE 5 % EX PTCH
1.0000 | MEDICATED_PATCH | Freq: Once | CUTANEOUS | Status: DC
  Administered 2017-05-30: 1 via TRANSDERMAL
  Filled 2017-05-30: qty 1

## 2017-05-30 NOTE — ED Notes (Signed)
Patient talking to family on phone

## 2017-05-30 NOTE — ED Provider Notes (Signed)
I received this patient in signout from [facility].  We were awaiting imaging results.  Imaging was notable for likely impacted left femoral neck fracture.  The patient has bilateral AKA's in his wheelchair bound with no anticipation of ambulating in the future.  I discussed with orthopedics, Dr. Ninfa Linden, who agrees with plan for supportive measures only because the patient is nonambulatory at baseline.  Provided with pain medication to use at home and cautioned on side effects.  Instructed to follow-up with PCP and with [facility] if she continues to have problems with thigh or hip pain.  She voiced understanding was discharged in satisfactory condition.   Kanon Novosel, Wenda Overland, MD 05/30/17 [phone]

## 2017-05-30 NOTE — ED Notes (Signed)
Pt verbalized understanding of d/c instructions and has no further questions. VSS, PTAR called and coming to get patient.

## 2017-05-30 NOTE — ED Notes (Signed)
Patient transported to CT 

## 2017-05-30 NOTE — Discharge Instructions (Signed)
Your X rays showed a fracture at your left hip (femur bone). This does not need to be repaired but may be causing you pain. You can follow up with [facility] orthopedics if you continue to have problems with pain in your left thigh/hip.   You have been prescribed narcotic medications to be used as needed for severe pain. Do not operate heavy machinery, drive, or work while taking these medications. These medications contain tylenol, so do not take tylenol while taking these medications.

## 2017-05-30 NOTE — ED Provider Notes (Signed)
[facility] EMERGENCY DEPARTMENT Provider Note   CSN: [id number] Arrival date & time: 05/30/17  1326     History   Chief Complaint Chief Complaint  Patient presents with  . Fall    HPI [name] is a 84 y.o. female.  HPI 84 year old African-American female past medical history significant for diabetes, ESRD on end-stage dialysis, hepatitis C's, hypertension, arthritis, AKA that presents to the emergency department today for evaluation of back pain.  Patient states that 3 weeks ago she slipped out of her wheelchair and when she was being lifted up they hit her back on the [place] lift.  Patient states that she has had pain in her back since then.  Patient also does report chronic back pain.  She has not been taking for pain at home.  Nothing makes her symptoms better.  Patient presents to the ED for evaluation of back pain.  She also notes that she does have a decubitus ulcer on her buttocks.  She has not taken anything for her symptoms prior to arrival.  Pt denies any fever, chill, ha, vision changes, lightheadedness, dizziness, congestion, neck pain, cp, sob, cough, abd pain, n/v/d, urinary symptoms, change in bowel habits, melena, hematochezia, lower extremity paresthesias.  Past Medical History:  Diagnosis Date  . Anemia   . Arthritis   . Bilateral breast cancer ([place])   . DM (diabetes mellitus) ([place])   . Dyslipidemia   . ESRD (end stage renal disease) on dialysis ([place])    a. [place]: MWF (07/10/2014)  . Hepatitis C   . Hyperparathyroidism   . Hypertension   . PVD (peripheral vascular disease) ([place])    a. s/p peripheral angiogram on 07/10/14 with no PCI and cont med Rx  . Vascular disease     Patient Active Problem List   Diagnosis Date Noted  . Pressure injury of skin 08/23/2016  . Cellulitis and abscess of trunk 08/21/2016  . S/P AKA (above knee amputation) bilateral ([place]) 04/22/2016  . Protein-calorie malnutrition, severe 11/20/2015  .  Osteomyelitis ([place]) 11/17/2015  . Pressure ulcer 11/16/2015  . Absolute anemia 11/16/2015  . Acute post-hemorrhagic anemia 11/15/2015  . Bleeding from dialysis shunt ([place]) 11/15/2015  . Incontinence of feces 11/15/2015  . ESRD (end stage renal disease) on dialysis ([place])   . Hepatitis C   . Hypertension   . Dyslipidemia   . PVD (peripheral vascular disease) ([place]) 07/02/2014  . End stage renal disease ([place]) 08/08/2013  . Postoperative wound hematoma 08/08/2013  . Breast cancer of lower-outer quadrant of right female breast ([place]) 07/19/2013    Past Surgical History:  Procedure Laterality Date  . ABDOMINAL HYSTERECTOMY    . AMPUTATION Bilateral 11/21/2015   Procedure: AMPUTATION ABOVE KNEE;  Surgeon: Newt Minion, MD;  Location: [facility];  Service: Orthopedics;  Laterality: Bilateral;  . APPENDECTOMY    . ARTERIOVENOUS GRAFT PLACEMENT Left 03/20/09   "had right arm previously but couldn't use it anymore"  . BACK SURGERY    . BREAST BIOPSY Bilateral   . BREAST BIOPSY Right 08/08/2013   Procedure: Evacuation Hematoma Right Chest;  Surgeon: [name], MD;  Location: [facility];  Service: General;  Laterality: Right;  . CATARACT EXTRACTION Right   . DG AV DIALYSIS GRAFT DECLOT OR Left 01/27/11, 02/15/11, 03/15/11, 10/13/11   lua  . EVACUATION BREAST HEMATOMA  2001; 2015   left; right  . INSERTION OF DIALYSIS CATHETER Right 11/15/2015   Procedure: INSERTION OF Right Femoral  DIALYSIS CATHETER.;  Surgeon: Rosetta Posner, MD;  Location: [facility];  Service: Vascular;  Laterality: Right;  . MASTECTOMY Left 1970's?  Marland Kitchen MASTECTOMY COMPLETE / SIMPLE Right 08/07/2013  . MEDIAN NERVE REPAIR Left    "decompression"  . PERIPHERAL VASCULAR CATHETERIZATION N/A 07/10/2014   Procedure: Abdominal Aortogram;  Surgeon: Wellington Hampshire, MD;  Location: [facility];  Service: Cardiovascular;  Laterality: N/A;  . POSTERIOR FUSION CERVICAL SPINE     "have 6 screws"  . THROMBECTOMY AND REVISION OF ARTERIOVENTOUS  (AV) GORETEX  GRAFT Left 04/18/2009  . THROMBECTOMY AND REVISION OF ARTERIOVENTOUS (AV) GORETEX  GRAFT Left 11/15/2015   Procedure: Thrombectomy and Revision of Left ARm AV Gortex Graft.;  Surgeon: Rosetta Posner, MD;  Location: [facility];  Service: Vascular;  Laterality: Left;  . TOE AMPUTATION Right    1,2nd toes  . TONSILLECTOMY    . TOTAL MASTECTOMY Right 08/07/2013   Procedure: RIGHT TOTAL MASTECTOMY;  Surgeon: [name], MD;  Location: [facility];  Service: General;  Laterality: Right;  . UTERINE FIBROID SURGERY       OB History   None      Home Medications    Prior to Admission medications   Medication Sig Start Date End Date Taking? Authorizing Provider  Amino Acids-Protein Hydrolys (FEEDING SUPPLEMENT, PRO-STAT SUGAR FREE 64,) LIQD Take 30 mLs by mouth 2 (two) times daily. Patient not taking: Reported on 03/21/2017 08/26/16   Theodis Blaze, MD  anastrozole (ARIMIDEX) 1 MG tablet take 1 tablet by mouth once daily 07/04/16   Wyatt Portela, MD  B Complex-C-Folic Acid (DIALYVITE TABLET) TABS Take 1 tablet by mouth daily after supper. 10/31/15   [provider]  calcitRIOL (ROCALTROL) 0.25 MCG capsule Take 3 capsules (0.75 mcg total) by mouth every Monday, Wednesday, and Friday with hemodialysis. 08/27/16   Theodis Blaze, MD  cinacalcet [facility]) 30 MG tablet Take 2 tablets (60 mg total) by mouth every Monday, Wednesday, and Friday with hemodialysis. 08/27/16   Theodis Blaze, MD  collagenase (SANTYL) ointment Apply topically daily. Patient taking differently: Apply 1 application topically daily.  08/26/16   Theodis Blaze, MD  ENSURE (ENSURE) Take 237 mLs by mouth 2 (two) times daily between meals.    [provider]  lanthanum (FOSRENOL) 1000 MG chewable tablet Chew 1,000 mg by mouth daily after supper.     [provider]  metoprolol succinate (TOPROL XL) 25 MG 24 hr tablet Take 0.5 tablets (12.5 mg total) by mouth daily. Patient taking differently: Take 25 mg  by mouth at bedtime.  08/27/16   Theodis Blaze, MD  Nutritional Supplements (FEEDING SUPPLEMENT, NEPRO CARB STEADY,) LIQD Take 237 mLs by mouth 2 (two) times daily between meals. Patient not taking: Reported on 03/21/2017 11/24/15   Elgergawy, Silver Huguenin, MD  oxyCODONE (OXY IR/ROXICODONE) 5 MG immediate release tablet Take 1 tablet (5 mg total) by mouth every 4 (four) hours as needed for moderate pain. Patient not taking: Reported on 03/21/2017 08/26/16   Theodis Blaze, MD    Family History Family History  Problem Relation Age of Onset  . Cancer Mother   . Hypertension Mother     Social History Social History   Tobacco Use  . Smoking status: Former Smoker    Types: Cigarettes  . Smokeless tobacco: Never Used  . Tobacco comment: "stopped smoking in 1969"  Substance Use Topics  . Alcohol use: Not Currently  Alcohol/week: 0.0 oz    Frequency: Never    Comment: "no alcohol since 1969"  . Drug use: No     Allergies   Aspirin   Review of Systems Review of Systems  All other systems reviewed and are negative.    Physical Exam Updated Vital Signs BP (!) 112/36 (BP Location: Right Arm)   Pulse 76   Temp 98.9 F (37.2 C) (Oral)   Resp 18   SpO2 100%   Physical Exam  Constitutional: She appears well-developed and well-nourished. No distress.  Thin and chronically ill-appearing.  Nontoxic and not septic appearing.  HENT:  Head: Normocephalic and atraumatic.  Eyes: Right eye exhibits no discharge. Left eye exhibits no discharge. No scleral icterus.  Neck: Normal range of motion.  Pulmonary/Chest: No respiratory distress.  Musculoskeletal: Normal range of motion.  Bilateral AKA.  midline T spine or L spine tenderness. No deformities or step offs noted. Full ROM. Pelvis is stable. Patient able to move all extremities any difficulties.  Neurological: She is alert.  Strength 5 out of 5 in lower extremities.  Sensation intact.  Skin: Skin is warm and dry. Capillary refill  takes less than 2 seconds. No pallor.  Patient does have stage I decubitus ulcer without any signs of purulent drainage, erythema, warmth, fluctuance or tenderness to palpation.  Psychiatric: Her behavior is normal. Judgment and thought content normal.  Nursing note and vitals reviewed.    ED Treatments / Results  Labs (all labs ordered are listed, but only abnormal results are displayed) Labs Reviewed - No data to display  EKG None  Radiology No results found.  Procedures Procedures (including critical care time)  Medications Ordered in ED Medications  lidocaine (LIDODERM) 5 % 1 patch (has no administration in time range)     Initial Impression / Assessment and Plan / ED Course  I have reviewed the triage vital signs and the nursing notes.  Pertinent labs & imaging results that were available during my care of the patient were reviewed by me and considered in my medical decision making (see chart for details).     She presents to the ED with complaints of acute on chronic back pain after mechanical fall out of wheelchair 3 weeks ago.  The patient appears to be in no acute distress.  Vital signs are reassuring.  Patient is wheelchair-bound at baseline due to bilateral AKA's.  Patient with end-stage renal disease on end-stage dialysis.  Patient is able to move all extremities.  She does complain of some midline L-spine and T-spine tenderness.  Patient is pelvis is stable.  Strength is 5 out of 5 in lower extremities.  Station intact.  Have a decubitus ulcer that does not show any signs of infection at this time.  This appears to be acute on chronic back pain for patient.  She is neurovascular intact on a focal neuro deficit.  Given history of breast cancer will obtain CT scan of thoracic and lumbar region to evaluate any acute abnormalities.  This is reassuring patient can be discharged home with lidocaine and Tylenol with outpatient follow-up with a primary care doctor.  Care  handoff to Dr. Rex Kras. Pt has pending at this time imaging and dispo hoome.  Disposition likely home pending lab and test results. Care dicussed and plan agreed upon with oncoming PA. Pt updated on plan of care and is currently hemodynamically stable at this time with normal vs.     Final Clinical Impressions(s) / ED Diagnoses  Final diagnoses:  Acute midline back pain, unspecified back location    ED Discharge Orders    None       Aaron Edelman 05/30/17 1622    Quintella Reichert, MD 05/31/17 [phone]

## 2017-05-30 NOTE — ED Notes (Signed)
Patient returned from CT/xray

## 2017-05-30 NOTE — ED Triage Notes (Addendum)
Per [facility] pt sent here from [facility] where received her full dialysis treatment today, pt complains of lower back pain, buttocks pain and shoulder pain that has been going on since having a mechanical fall out of her wheelchair at home x 1 month ago. Pt axox4. Pt is bilateral BKA.

## 2017-06-01 ENCOUNTER — Other Ambulatory Visit: Payer: Self-pay | Admitting: Oncology

## 2017-06-01 DIAGNOSIS — N2581 Secondary hyperparathyroidism of renal origin: Secondary | ICD-10-CM | POA: Diagnosis not present

## 2017-06-01 DIAGNOSIS — D509 Iron deficiency anemia, unspecified: Secondary | ICD-10-CM | POA: Diagnosis not present

## 2017-06-01 DIAGNOSIS — D631 Anemia in chronic kidney disease: Secondary | ICD-10-CM | POA: Diagnosis not present

## 2017-06-01 DIAGNOSIS — E119 Type 2 diabetes mellitus without complications: Secondary | ICD-10-CM | POA: Diagnosis not present

## 2017-06-01 DIAGNOSIS — N186 End stage renal disease: Secondary | ICD-10-CM | POA: Diagnosis not present

## 2017-06-03 DIAGNOSIS — E119 Type 2 diabetes mellitus without complications: Secondary | ICD-10-CM | POA: Diagnosis not present

## 2017-06-03 DIAGNOSIS — D509 Iron deficiency anemia, unspecified: Secondary | ICD-10-CM | POA: Diagnosis not present

## 2017-06-03 DIAGNOSIS — D631 Anemia in chronic kidney disease: Secondary | ICD-10-CM | POA: Diagnosis not present

## 2017-06-03 DIAGNOSIS — N2581 Secondary hyperparathyroidism of renal origin: Secondary | ICD-10-CM | POA: Diagnosis not present

## 2017-06-03 DIAGNOSIS — N186 End stage renal disease: Secondary | ICD-10-CM | POA: Diagnosis not present

## 2017-06-06 DIAGNOSIS — N186 End stage renal disease: Secondary | ICD-10-CM | POA: Diagnosis not present

## 2017-06-06 DIAGNOSIS — E1129 Type 2 diabetes mellitus with other diabetic kidney complication: Secondary | ICD-10-CM | POA: Diagnosis not present

## 2017-06-06 DIAGNOSIS — N2581 Secondary hyperparathyroidism of renal origin: Secondary | ICD-10-CM | POA: Diagnosis not present

## 2017-06-06 DIAGNOSIS — Z992 Dependence on renal dialysis: Secondary | ICD-10-CM | POA: Diagnosis not present

## 2017-06-06 DIAGNOSIS — D631 Anemia in chronic kidney disease: Secondary | ICD-10-CM | POA: Diagnosis not present

## 2017-06-06 DIAGNOSIS — D509 Iron deficiency anemia, unspecified: Secondary | ICD-10-CM | POA: Diagnosis not present

## 2017-06-08 DIAGNOSIS — D631 Anemia in chronic kidney disease: Secondary | ICD-10-CM | POA: Diagnosis not present

## 2017-06-08 DIAGNOSIS — N2581 Secondary hyperparathyroidism of renal origin: Secondary | ICD-10-CM | POA: Diagnosis not present

## 2017-06-08 DIAGNOSIS — N186 End stage renal disease: Secondary | ICD-10-CM | POA: Diagnosis not present

## 2017-06-08 DIAGNOSIS — D509 Iron deficiency anemia, unspecified: Secondary | ICD-10-CM | POA: Diagnosis not present

## 2017-06-10 DIAGNOSIS — D631 Anemia in chronic kidney disease: Secondary | ICD-10-CM | POA: Diagnosis not present

## 2017-06-10 DIAGNOSIS — D509 Iron deficiency anemia, unspecified: Secondary | ICD-10-CM | POA: Diagnosis not present

## 2017-06-10 DIAGNOSIS — N2581 Secondary hyperparathyroidism of renal origin: Secondary | ICD-10-CM | POA: Diagnosis not present

## 2017-06-10 DIAGNOSIS — N186 End stage renal disease: Secondary | ICD-10-CM | POA: Diagnosis not present

## 2017-06-13 DIAGNOSIS — N186 End stage renal disease: Secondary | ICD-10-CM | POA: Diagnosis not present

## 2017-06-13 DIAGNOSIS — D631 Anemia in chronic kidney disease: Secondary | ICD-10-CM | POA: Diagnosis not present

## 2017-06-13 DIAGNOSIS — N2581 Secondary hyperparathyroidism of renal origin: Secondary | ICD-10-CM | POA: Diagnosis not present

## 2017-06-13 DIAGNOSIS — D509 Iron deficiency anemia, unspecified: Secondary | ICD-10-CM | POA: Diagnosis not present

## 2017-06-15 DIAGNOSIS — D631 Anemia in chronic kidney disease: Secondary | ICD-10-CM | POA: Diagnosis not present

## 2017-06-15 DIAGNOSIS — N186 End stage renal disease: Secondary | ICD-10-CM | POA: Diagnosis not present

## 2017-06-15 DIAGNOSIS — D509 Iron deficiency anemia, unspecified: Secondary | ICD-10-CM | POA: Diagnosis not present

## 2017-06-15 DIAGNOSIS — N2581 Secondary hyperparathyroidism of renal origin: Secondary | ICD-10-CM | POA: Diagnosis not present

## 2017-06-17 DIAGNOSIS — D509 Iron deficiency anemia, unspecified: Secondary | ICD-10-CM | POA: Diagnosis not present

## 2017-06-17 DIAGNOSIS — D631 Anemia in chronic kidney disease: Secondary | ICD-10-CM | POA: Diagnosis not present

## 2017-06-17 DIAGNOSIS — N2581 Secondary hyperparathyroidism of renal origin: Secondary | ICD-10-CM | POA: Diagnosis not present

## 2017-06-17 DIAGNOSIS — N186 End stage renal disease: Secondary | ICD-10-CM | POA: Diagnosis not present

## 2017-06-20 DIAGNOSIS — D631 Anemia in chronic kidney disease: Secondary | ICD-10-CM | POA: Diagnosis not present

## 2017-06-20 DIAGNOSIS — N2581 Secondary hyperparathyroidism of renal origin: Secondary | ICD-10-CM | POA: Diagnosis not present

## 2017-06-20 DIAGNOSIS — D509 Iron deficiency anemia, unspecified: Secondary | ICD-10-CM | POA: Diagnosis not present

## 2017-06-20 DIAGNOSIS — N186 End stage renal disease: Secondary | ICD-10-CM | POA: Diagnosis not present

## 2017-06-22 ENCOUNTER — Other Ambulatory Visit (HOSPITAL_COMMUNITY): Payer: Self-pay | Admitting: Nephrology

## 2017-06-22 ENCOUNTER — Other Ambulatory Visit: Payer: Self-pay | Admitting: Radiology

## 2017-06-22 DIAGNOSIS — N186 End stage renal disease: Secondary | ICD-10-CM | POA: Diagnosis not present

## 2017-06-22 DIAGNOSIS — D631 Anemia in chronic kidney disease: Secondary | ICD-10-CM | POA: Diagnosis not present

## 2017-06-22 DIAGNOSIS — D509 Iron deficiency anemia, unspecified: Secondary | ICD-10-CM | POA: Diagnosis not present

## 2017-06-22 DIAGNOSIS — N2581 Secondary hyperparathyroidism of renal origin: Secondary | ICD-10-CM | POA: Diagnosis not present

## 2017-06-23 ENCOUNTER — Other Ambulatory Visit: Payer: Self-pay

## 2017-06-23 ENCOUNTER — Encounter (HOSPITAL_COMMUNITY): Payer: Self-pay | Admitting: Interventional Radiology

## 2017-06-23 ENCOUNTER — Ambulatory Visit (HOSPITAL_COMMUNITY)
Admission: RE | Admit: 2017-06-23 | Discharge: 2017-06-23 | Disposition: A | Discharge status: Home / Self Care | Payer: Medicare Other | Source: Ambulatory Visit | Attending: Nephrology | Admitting: Nephrology

## 2017-06-23 ENCOUNTER — Emergency Department (HOSPITAL_COMMUNITY): Payer: Medicare Other

## 2017-06-23 ENCOUNTER — Other Ambulatory Visit (HOSPITAL_COMMUNITY): Payer: Self-pay | Admitting: Nephrology

## 2017-06-23 ENCOUNTER — Inpatient Hospital Stay (HOSPITAL_COMMUNITY)
Admission: EM | Admit: 2017-06-23 | Discharge: 2017-06-26 | DRG: 252 | Disposition: A | Discharge status: Hospice | Payer: Medicare Other | Attending: Internal Medicine | Admitting: Internal Medicine

## 2017-06-23 DIAGNOSIS — E1122 Type 2 diabetes mellitus with diabetic chronic kidney disease: Secondary | ICD-10-CM | POA: Diagnosis present

## 2017-06-23 DIAGNOSIS — Z79899 Other long term (current) drug therapy: Secondary | ICD-10-CM

## 2017-06-23 DIAGNOSIS — Z8249 Family history of ischemic heart disease and other diseases of the circulatory system: Secondary | ICD-10-CM

## 2017-06-23 DIAGNOSIS — Z6820 Body mass index (BMI) 20.0-20.9, adult: Secondary | ICD-10-CM | POA: Diagnosis not present

## 2017-06-23 DIAGNOSIS — Z89612 Acquired absence of left leg above knee: Secondary | ICD-10-CM

## 2017-06-23 DIAGNOSIS — J90 Pleural effusion, not elsewhere classified: Secondary | ICD-10-CM | POA: Diagnosis present

## 2017-06-23 DIAGNOSIS — E213 Hyperparathyroidism, unspecified: Secondary | ICD-10-CM | POA: Diagnosis present

## 2017-06-23 DIAGNOSIS — N186 End stage renal disease: Secondary | ICD-10-CM

## 2017-06-23 DIAGNOSIS — Y838 Other surgical procedures as the cause of abnormal reaction of the patient, or of later complication, without mention of misadventure at the time of the procedure: Secondary | ICD-10-CM | POA: Diagnosis present

## 2017-06-23 DIAGNOSIS — D631 Anemia in chronic kidney disease: Secondary | ICD-10-CM | POA: Diagnosis not present

## 2017-06-23 DIAGNOSIS — T82868A Thrombosis of vascular prosthetic devices, implants and grafts, initial encounter: Secondary | ICD-10-CM | POA: Diagnosis present

## 2017-06-23 DIAGNOSIS — Z992 Dependence on renal dialysis: Secondary | ICD-10-CM

## 2017-06-23 DIAGNOSIS — Y832 Surgical operation with anastomosis, bypass or graft as the cause of abnormal reaction of the patient, or of later complication, without mention of misadventure at the time of the procedure: Secondary | ICD-10-CM | POA: Diagnosis present

## 2017-06-23 DIAGNOSIS — N2581 Secondary hyperparathyroidism of renal origin: Secondary | ICD-10-CM | POA: Diagnosis present

## 2017-06-23 DIAGNOSIS — Z886 Allergy status to analgesic agent status: Secondary | ICD-10-CM

## 2017-06-23 DIAGNOSIS — E876 Hypokalemia: Secondary | ICD-10-CM | POA: Diagnosis not present

## 2017-06-23 DIAGNOSIS — I120 Hypertensive chronic kidney disease with stage 5 chronic kidney disease or end stage renal disease: Secondary | ICD-10-CM | POA: Diagnosis present

## 2017-06-23 DIAGNOSIS — R079 Chest pain, unspecified: Secondary | ICD-10-CM | POA: Diagnosis not present

## 2017-06-23 DIAGNOSIS — L8990 Pressure ulcer of unspecified site, unspecified stage: Secondary | ICD-10-CM | POA: Diagnosis not present

## 2017-06-23 DIAGNOSIS — I472 Ventricular tachycardia: Secondary | ICD-10-CM | POA: Diagnosis present

## 2017-06-23 DIAGNOSIS — Z981 Arthrodesis status: Secondary | ICD-10-CM | POA: Diagnosis not present

## 2017-06-23 DIAGNOSIS — I219 Acute myocardial infarction, unspecified: Secondary | ICD-10-CM | POA: Diagnosis not present

## 2017-06-23 DIAGNOSIS — B182 Chronic viral hepatitis C: Secondary | ICD-10-CM | POA: Diagnosis not present

## 2017-06-23 DIAGNOSIS — E162 Hypoglycemia, unspecified: Secondary | ICD-10-CM | POA: Diagnosis not present

## 2017-06-23 DIAGNOSIS — R54 Age-related physical debility: Secondary | ICD-10-CM | POA: Diagnosis present

## 2017-06-23 DIAGNOSIS — I493 Ventricular premature depolarization: Secondary | ICD-10-CM | POA: Diagnosis present

## 2017-06-23 DIAGNOSIS — J810 Acute pulmonary edema: Secondary | ICD-10-CM | POA: Diagnosis not present

## 2017-06-23 DIAGNOSIS — J9601 Acute respiratory failure with hypoxia: Secondary | ICD-10-CM | POA: Diagnosis not present

## 2017-06-23 DIAGNOSIS — Z89611 Acquired absence of right leg above knee: Secondary | ICD-10-CM | POA: Diagnosis not present

## 2017-06-23 DIAGNOSIS — Z853 Personal history of malignant neoplasm of breast: Secondary | ICD-10-CM

## 2017-06-23 DIAGNOSIS — E1159 Type 2 diabetes mellitus with other circulatory complications: Secondary | ICD-10-CM | POA: Diagnosis not present

## 2017-06-23 DIAGNOSIS — Z79811 Long term (current) use of aromatase inhibitors: Secondary | ICD-10-CM

## 2017-06-23 DIAGNOSIS — Z515 Encounter for palliative care: Secondary | ICD-10-CM | POA: Diagnosis not present

## 2017-06-23 DIAGNOSIS — Z87891 Personal history of nicotine dependence: Secondary | ICD-10-CM

## 2017-06-23 DIAGNOSIS — T82856A Stenosis of peripheral vascular stent, initial encounter: Secondary | ICD-10-CM | POA: Diagnosis present

## 2017-06-23 DIAGNOSIS — E43 Unspecified severe protein-calorie malnutrition: Secondary | ICD-10-CM | POA: Diagnosis present

## 2017-06-23 DIAGNOSIS — E785 Hyperlipidemia, unspecified: Secondary | ICD-10-CM | POA: Diagnosis not present

## 2017-06-23 DIAGNOSIS — Z9013 Acquired absence of bilateral breasts and nipples: Secondary | ICD-10-CM | POA: Diagnosis not present

## 2017-06-23 DIAGNOSIS — Z66 Do not resuscitate: Secondary | ICD-10-CM | POA: Diagnosis present

## 2017-06-23 DIAGNOSIS — Z90710 Acquired absence of both cervix and uterus: Secondary | ICD-10-CM

## 2017-06-23 DIAGNOSIS — R0602 Shortness of breath: Secondary | ICD-10-CM | POA: Diagnosis not present

## 2017-06-23 DIAGNOSIS — I953 Hypotension of hemodialysis: Secondary | ICD-10-CM | POA: Diagnosis not present

## 2017-06-23 DIAGNOSIS — I10 Essential (primary) hypertension: Secondary | ICD-10-CM | POA: Diagnosis present

## 2017-06-23 DIAGNOSIS — E8770 Fluid overload, unspecified: Secondary | ICD-10-CM | POA: Diagnosis not present

## 2017-06-23 DIAGNOSIS — I214 Non-ST elevation (NSTEMI) myocardial infarction: Secondary | ICD-10-CM

## 2017-06-23 DIAGNOSIS — L89159 Pressure ulcer of sacral region, unspecified stage: Secondary | ICD-10-CM | POA: Diagnosis not present

## 2017-06-23 DIAGNOSIS — Z7401 Bed confinement status: Secondary | ICD-10-CM | POA: Diagnosis not present

## 2017-06-23 DIAGNOSIS — E46 Unspecified protein-calorie malnutrition: Secondary | ICD-10-CM | POA: Diagnosis not present

## 2017-06-23 DIAGNOSIS — L89152 Pressure ulcer of sacral region, stage 2: Secondary | ICD-10-CM | POA: Diagnosis present

## 2017-06-23 DIAGNOSIS — Z9841 Cataract extraction status, right eye: Secondary | ICD-10-CM | POA: Diagnosis not present

## 2017-06-23 DIAGNOSIS — I70209 Unspecified atherosclerosis of native arteries of extremities, unspecified extremity: Secondary | ICD-10-CM | POA: Diagnosis not present

## 2017-06-23 DIAGNOSIS — R627 Adult failure to thrive: Secondary | ICD-10-CM | POA: Diagnosis present

## 2017-06-23 DIAGNOSIS — C50511 Malignant neoplasm of lower-outer quadrant of right female breast: Secondary | ICD-10-CM | POA: Diagnosis not present

## 2017-06-23 DIAGNOSIS — M2550 Pain in unspecified joint: Secondary | ICD-10-CM | POA: Diagnosis not present

## 2017-06-23 DIAGNOSIS — E11649 Type 2 diabetes mellitus with hypoglycemia without coma: Secondary | ICD-10-CM | POA: Diagnosis present

## 2017-06-23 DIAGNOSIS — B1920 Unspecified viral hepatitis C without hepatic coma: Secondary | ICD-10-CM | POA: Diagnosis present

## 2017-06-23 DIAGNOSIS — M1990 Unspecified osteoarthritis, unspecified site: Secondary | ICD-10-CM | POA: Diagnosis present

## 2017-06-23 DIAGNOSIS — Z79891 Long term (current) use of opiate analgesic: Secondary | ICD-10-CM | POA: Diagnosis not present

## 2017-06-23 DIAGNOSIS — I447 Left bundle-branch block, unspecified: Secondary | ICD-10-CM | POA: Diagnosis present

## 2017-06-23 DIAGNOSIS — E8889 Other specified metabolic disorders: Secondary | ICD-10-CM | POA: Diagnosis present

## 2017-06-23 DIAGNOSIS — E1151 Type 2 diabetes mellitus with diabetic peripheral angiopathy without gangrene: Secondary | ICD-10-CM | POA: Diagnosis present

## 2017-06-23 HISTORY — PX: IR US GUIDE VASC ACCESS LEFT: IMG2389

## 2017-06-23 HISTORY — PX: IR PTA ADDL CENTRAL DIALYSIS SEG THRU DIALY CIRCUIT LEFT: IMG6108

## 2017-06-23 HISTORY — PX: IR THROMBECTOMY AV FISTULA W/THROMBOLYSIS INC/SHUNT/IMG LEFT: IMG6105

## 2017-06-23 LAB — POCT I-STAT, CHEM 8
BUN: 27 mg/dL — AB (ref 6–20)
CHLORIDE: 98 mmol/L — AB (ref 101–111)
Calcium, Ion: 1.24 mmol/L (ref 1.15–1.40)
Creatinine, Ser: 3.2 mg/dL — ABNORMAL HIGH (ref 0.44–1.00)
Glucose, Bld: 67 mg/dL (ref 65–99)
HCT: 34 % — ABNORMAL LOW (ref 36.0–46.0)
Hemoglobin: 11.6 g/dL — ABNORMAL LOW (ref 12.0–15.0)
POTASSIUM: 3.4 mmol/L — AB (ref 3.5–5.1)
SODIUM: 139 mmol/L (ref 135–145)
TCO2: 28 mmol/L (ref 22–32)

## 2017-06-23 LAB — TROPONIN I
TROPONIN I: 2.32 ng/mL — AB (ref <0.03)
Troponin I: 2.2 ng/mL (ref <0.03)

## 2017-06-23 LAB — GLUCOSE, CAPILLARY
GLUCOSE-CAPILLARY: 58 mg/dL — AB (ref 65–99)
Glucose-Capillary: 59 mg/dL — ABNORMAL LOW (ref 65–99)
Glucose-Capillary: 98 mg/dL (ref 65–99)

## 2017-06-23 MED ORDER — RENA-VITE PO TABS
1.0000 | ORAL_TABLET | Freq: Every day | ORAL | Status: DC
  Administered 2017-06-24 – 2017-06-25 (×2): 1 via ORAL
  Filled 2017-06-23 (×2): qty 1

## 2017-06-23 MED ORDER — ZOLPIDEM TARTRATE 5 MG PO TABS: 5.0000 mg | ORAL_TABLET | Freq: Every evening | ORAL | Status: DC | PRN

## 2017-06-23 MED ORDER — HEPARIN BOLUS VIA INFUSION
3000.0000 [IU] | Freq: Once | INTRAVENOUS | Status: DC
  Filled 2017-06-23: qty 3000

## 2017-06-23 MED ORDER — LANTHANUM CARBONATE 500 MG PO CHEW
1000.0000 mg | CHEWABLE_TABLET | Freq: Every day | ORAL | Status: DC
  Administered 2017-06-24 – 2017-06-25 (×2): 1000 mg via ORAL
  Filled 2017-06-23 (×2): qty 2

## 2017-06-23 MED ORDER — LIDOCAINE 5 % EX PTCH
1.0000 | MEDICATED_PATCH | CUTANEOUS | Status: DC
  Administered 2017-06-24 – 2017-06-25 (×3): 1 via TRANSDERMAL
  Filled 2017-06-23 (×3): qty 1

## 2017-06-23 MED ORDER — ONDANSETRON HCL 4 MG/2ML IJ SOLN: 4.0000 mg | Freq: Four times a day (QID) | INTRAMUSCULAR | Status: DC | PRN

## 2017-06-23 MED ORDER — LIDOCAINE HCL (PF) 1 % IJ SOLN
INTRAMUSCULAR | Status: AC | PRN
  Administered 2017-06-23: 10 mL

## 2017-06-23 MED ORDER — ENSURE ENLIVE PO LIQD: 237.0000 mL | Freq: Two times a day (BID) | ORAL | Status: DC

## 2017-06-23 MED ORDER — GLUCOSE 4 G PO CHEW
CHEWABLE_TABLET | ORAL | Status: AC
  Administered 2017-06-23: 13:00:00 1
  Filled 2017-06-23: qty 1

## 2017-06-23 MED ORDER — ACETAMINOPHEN 325 MG PO TABS: 650.0000 mg | ORAL_TABLET | ORAL | Status: DC | PRN

## 2017-06-23 MED ORDER — MORPHINE SULFATE (PF) 4 MG/ML IV SOLN: 0.5000 mg | INTRAVENOUS | Status: DC | PRN

## 2017-06-23 MED ORDER — ATORVASTATIN CALCIUM 80 MG PO TABS
80.0000 mg | ORAL_TABLET | Freq: Every day | ORAL | Status: DC
  Administered 2017-06-24 – 2017-06-25 (×3): 80 mg via ORAL
  Filled 2017-06-23 (×4): qty 1

## 2017-06-23 MED ORDER — METOPROLOL SUCCINATE ER 25 MG PO TB24
25.0000 mg | ORAL_TABLET | Freq: Every day | ORAL | Status: DC
  Administered 2017-06-24 – 2017-06-25 (×2): 25 mg via ORAL
  Filled 2017-06-23 (×4): qty 1

## 2017-06-23 MED ORDER — ASPIRIN 81 MG PO CHEW
324.0000 mg | CHEWABLE_TABLET | Freq: Once | ORAL | Status: AC
  Administered 2017-06-23: 324 mg via ORAL
  Filled 2017-06-23: qty 4

## 2017-06-23 MED ORDER — DEXTROSE 50 % IV SOLN: 50.0000 mL | INTRAVENOUS | Status: DC | PRN

## 2017-06-23 MED ORDER — COLLAGENASE 250 UNIT/GM EX OINT
1.0000 "application " | TOPICAL_OINTMENT | Freq: Every day | CUTANEOUS | Status: DC
  Filled 2017-06-23: qty 30

## 2017-06-23 MED ORDER — SODIUM CHLORIDE 0.9 % IV SOLN: INTRAVENOUS | Status: DC

## 2017-06-23 MED ORDER — IOPAMIDOL (ISOVUE-300) INJECTION 61%
INTRAVENOUS | Status: AC
  Administered 2017-06-23: 50 mL
  Filled 2017-06-23: qty 100

## 2017-06-23 MED ORDER — NITROGLYCERIN 0.4 MG SL SUBL: 0.4000 mg | SUBLINGUAL_TABLET | SUBLINGUAL | Status: DC | PRN

## 2017-06-23 MED ORDER — OXYCODONE-ACETAMINOPHEN 5-325 MG PO TABS
1.0000 | ORAL_TABLET | Freq: Four times a day (QID) | ORAL | Status: DC | PRN
  Administered 2017-06-25: 1 via ORAL
  Filled 2017-06-23: qty 1

## 2017-06-23 MED ORDER — HYDRALAZINE HCL 20 MG/ML IJ SOLN: 5.0000 mg | INTRAMUSCULAR | Status: DC | PRN

## 2017-06-23 MED ORDER — ALPRAZOLAM 0.25 MG PO TABS: 0.2500 mg | ORAL_TABLET | Freq: Two times a day (BID) | ORAL | Status: DC | PRN

## 2017-06-23 MED ORDER — ANASTROZOLE 1 MG PO TABS
1.0000 mg | ORAL_TABLET | Freq: Every day | ORAL | Status: DC
  Administered 2017-06-25 – 2017-06-26 (×2): 1 mg via ORAL
  Filled 2017-06-23 (×3): qty 1

## 2017-06-23 MED ORDER — ALTEPLASE 2 MG IJ SOLR
INTRAMUSCULAR | Status: AC
  Filled 2017-06-23: qty 4

## 2017-06-23 MED ORDER — LIDOCAINE HCL 1 % IJ SOLN
INTRAMUSCULAR | Status: AC
  Filled 2017-06-23: qty 20

## 2017-06-23 MED ORDER — ALTEPLASE 2 MG IJ SOLR
INTRAMUSCULAR | Status: AC | PRN
  Administered 2017-06-23: 4 mg

## 2017-06-23 MED ORDER — HEPARIN BOLUS VIA INFUSION
2000.0000 [IU] | Freq: Once | INTRAVENOUS | Status: AC
  Administered 2017-06-23: 2000 [IU] via INTRAVENOUS
  Filled 2017-06-23: qty 2000

## 2017-06-23 MED ORDER — HEPARIN SODIUM (PORCINE) 1000 UNIT/ML IJ SOLN
INTRAMUSCULAR | Status: AC
  Administered 2017-06-23: 5000 [IU]
  Filled 2017-06-23: qty 1

## 2017-06-23 MED ORDER — ASPIRIN 81 MG PO CHEW: 324.0000 mg | CHEWABLE_TABLET | Freq: Every day | ORAL | Status: DC

## 2017-06-23 MED ORDER — CINACALCET HCL 30 MG PO TABS
30.0000 mg | ORAL_TABLET | Freq: Every day | ORAL | Status: DC
  Administered 2017-06-25 – 2017-06-26 (×2): 30 mg via ORAL
  Filled 2017-06-23 (×3): qty 1

## 2017-06-23 MED ORDER — DEXTROSE 50 % IV SOLN
INTRAVENOUS | Status: AC
  Administered 2017-06-23: 25 mL
  Filled 2017-06-23: qty 50

## 2017-06-23 MED ORDER — HEPARIN (PORCINE) IN NACL 100-0.45 UNIT/ML-% IJ SOLN
700.0000 [IU]/h | INTRAMUSCULAR | Status: DC
  Administered 2017-06-23: 650 [IU]/h via INTRAVENOUS
  Filled 2017-06-23 (×2): qty 250

## 2017-06-23 NOTE — Progress Notes (Signed)
Second phlebotomist not able to obtain labs, Brynda Greathouse  notified will continue to monitor

## 2017-06-23 NOTE — Progress Notes (Signed)
Received report

## 2017-06-23 NOTE — ED Notes (Signed)
Unable to obtain accurate BP automatic or manual. EDP made aware. Phlebotomy at bedside attempting to collect blood. Pt A&O.

## 2017-06-23 NOTE — Progress Notes (Addendum)
[facility] for heparin Indication: chest pain/ACS  Heparin Dosing Weight: 53.5 kg  Labs: Recent Labs    06/23/17 1535 06/23/17 1544 06/23/17 1900  HGB 8.8* 11.6*  --   HCT 26.0* 34.0*  --   CREATININE 2.50* 3.20*  --   TROPONINI  --   --  2.20*    Assessment: 89 yof with hx of bilateral AKA, ESRD on HD presenting with CP during thrombectomy of LUE HD graft. Pharmacy consulted to dose heparin for ACS. Not on anticoagulation PTA. Hg 11.6, plt pending, troponin 2.2 on admit. No active bleed issues documented but noted has had "bleeding events" in the past with aspirin per allergy documentation - will give smaller bolus.  Goal of Therapy:  Heparin level 0.3-0.7 units/ml Monitor platelets by anticoagulation protocol: Yes   Plan:  Heparin 2000 unit bolus Start heparin at 650 units/h 8h heparin level Daily heparin level/CBC Monitor closely for s/sx bleeding  Elicia Lamp, PharmD, BCPS Clinical Pharmacist 06/23/2017 10:14 PM

## 2017-06-23 NOTE — Consult Note (Addendum)
Cardiology Consultation Note    Patient ID: [name], MRN: [id number], DOB/AGE: [id number] y.o. Admit date: 06/23/2017   Date of Consult: 06/23/2017 Primary Physician: [name], MD Primary Cardiologist: Fletcher Anon  Reason for Consultation: NSTEMI Requesting MD: Dr. Blaine Hamper  HPI: [name] is a 84 y.o. female with a history of end-stage renal disease on HD, peripheral vascular disease status post bilateral lower extremity amputations, diabetes, hypertension, HCV, who presents with chest pain during elective thrombectomy of left upper extremity fistula.  Patient noted chest pain during the procedure, which was associated with shortness of breath and excessive sweating.  This stopped shortly after the procedure.  The patient was sent to the ED for further evaluation.  In the ED, ECG was notable for a left bundle branch block that was old, but otherwise no acute ischemic changes.  Labs showed a creatinine of 3.2, and a troponin that was elevated at 2.2.  She had been is admitted to the hospitalist service for further management.  Upon my interview, the patient denies any chest discomfort, but does have buttock pain, which she thinks is related to a decubitus ulcer.  Past Medical History:  Diagnosis Date  . Anemia   . Arthritis   . Bilateral breast cancer ([place])   . DM (diabetes mellitus) ([place])   . Dyslipidemia   . ESRD (end stage renal disease) on dialysis ([place])    a. [place]: MWF (07/10/2014)  . Hepatitis C   . Hyperparathyroidism   . Hypertension   . PVD (peripheral vascular disease) ([place])    a. s/p peripheral angiogram on 07/10/14 with no PCI and cont med Rx  . Vascular disease       Surgical History:  Past Surgical History:  Procedure Laterality Date  . ABDOMINAL HYSTERECTOMY    . AMPUTATION Bilateral 11/21/2015   Procedure: AMPUTATION ABOVE KNEE;  Surgeon: Newt Minion, MD;  Location: [facility];  Service: Orthopedics;  Laterality: Bilateral;  . APPENDECTOMY    .  ARTERIOVENOUS GRAFT PLACEMENT Left 03/20/09   "had right arm previously but couldn't use it anymore"  . BACK SURGERY    . BREAST BIOPSY Bilateral   . BREAST BIOPSY Right 08/08/2013   Procedure: Evacuation Hematoma Right Chest;  Surgeon: [name], MD;  Location: [place];  Service: General;  Laterality: Right;  . CATARACT EXTRACTION Right   . DG AV DIALYSIS GRAFT DECLOT OR Left 01/27/11, 02/15/11, 03/15/11, 10/13/11   lua  . EVACUATION BREAST HEMATOMA  2001; 2015   left; right  . INSERTION OF DIALYSIS CATHETER Right 11/15/2015   Procedure: INSERTION OF Right Femoral  DIALYSIS CATHETER.;  Surgeon: Rosetta Posner, MD;  Location[facility];  Service: Vascular;  Laterality: Right;  . IR PTA ADDL CENTRAL DIALYSIS SEG THRU DIALY CIRCUIT LEFT Left 06/23/2017  . IR THROMBECTOMY AV FISTULA W/THROMBOLYSIS INC/SHUNT/IMG LEFT Left 06/23/2017  . IR US GUIDE VASC ACCESS LEFT  06/23/2017  . MASTECTOMY Left 1970's?  Marland Kitchen MASTECTOMY COMPLETE / SIMPLE Right 08/07/2013  . MEDIAN NERVE REPAIR Left    "decompression"  . PERIPHERAL VASCULAR CATHETERIZATION N/A 07/10/2014   Procedure: Abdominal Aortogram;  Surgeon: Wellington Hampshire, MD;  Location: [facility] IN[facility]ASIVE [facility] CUPID;  Service: Cardiovascular;  Laterality: N/A;  . POSTERIOR FUSION CERVICAL SPINE     "have 6 screws"  . THROMBECTOMY AND REVISION OF ARTERIOVENTOUS (AV) GORETEX  GRAFT Left 04/18/2009  . THROMBECTOMY AND REVISION OF ARTERIOVENTOUS (AV) GORETEX  GRAFT Left 11/15/2015  Procedure: Thrombectomy and Revision of Left ARm AV Gortex Graft.;  Surgeon: Rosetta Posner, MD;  Location: [facility];  Service: Vascular;  Laterality: Left;  . TOE AMPUTATION Right    1,2nd toes  . TONSILLECTOMY    . TOTAL MASTECTOMY Right 08/07/2013   Procedure: RIGHT TOTAL MASTECTOMY;  Surgeon: [name], MD;  Location: [place];  Service: General;  Laterality: Right;  . UTERINE FIBROID SURGERY       Home Meds: Prior to Admission medications   Medication Sig Start Date End Date Taking?  Authorizing Provider  anastrozole (ARIMIDEX) 1 MG tablet TAKE 1 TABLET BY MOUTH DAILY 06/01/17  Yes Shadad, Mathis Dad, MD  B Complex-C-Folic Acid (DIALYVITE TABLET) TABS Take 1 tablet by mouth daily after supper. 10/31/15  Yes [provider]  cinacalcet (SENSIPAR) 30 MG tablet Take 2 tablets (60 mg total) by mouth every Monday, Wednesday, and Friday with hemodialysis. Patient taking differently: Take 30 mg by mouth daily.  08/27/16  Yes Theodis Blaze, MD  collagenase (SANTYL) ointment Apply topically daily. Patient taking differently: Apply 1 application topically daily.  08/26/16  Yes Theodis Blaze, MD  ENSURE (ENSURE) Take 237 mLs by mouth 2 (two) times daily between meals.   Yes [provider]  lanthanum (FOSRENOL) 1000 MG chewable tablet Chew 1,000 mg by mouth daily after supper.    Yes [provider]  lidocaine (LIDODERM) 5 % Place 1 patch onto the skin daily. Remove & Discard patch within 12 hours or as directed by MD 05/30/17  Yes Leaphart, Zack Seal, PA-C  metoprolol succinate (TOPROL XL) 25 MG 24 hr tablet Take 0.5 tablets (12.5 mg total) by mouth daily. Patient taking differently: Take 25 mg by mouth at bedtime.  08/27/16  Yes Theodis Blaze, MD  oxyCODONE-acetaminophen (PERCOCET) 5-325 MG tablet Take 1 tablet by mouth every 6 (six) hours as needed for severe pain. 05/30/17  Yes Little, Wenda Overland, MD  Amino Acids-Protein Hydrolys (FEEDING SUPPLEMENT, PRO-STAT SUGAR FREE 64,) LIQD Take 30 mLs by mouth 2 (two) times daily. Patient not taking: Reported on 03/21/2017 08/26/16   Theodis Blaze, MD  calcitRIOL (ROCALTROL) 0.25 MCG capsule Take 3 capsules (0.75 mcg total) by mouth every Monday, Wednesday, and Friday with hemodialysis. Patient not taking: Reported on 06/23/2017 08/27/16   Theodis Blaze, MD  Nutritional Supplements (FEEDING SUPPLEMENT, NEPRO CARB STEADY,) LIQD Take 237 mLs by mouth 2 (two) times daily between meals. Patient not taking: Reported on 03/21/2017  11/24/15   Elgergawy, Silver Huguenin, MD  oxyCODONE (OXY IR/ROXICODONE) 5 MG immediate release tablet Take 1 tablet (5 mg total) by mouth every 4 (four) hours as needed for moderate pain. Patient not taking: Reported on 03/21/2017 08/26/16   Theodis Blaze, MD    Inpatient Medications:  . [START ON 06/24/2017] anastrozole  1 mg Oral Daily  . [START ON 06/24/2017] aspirin  324 mg Oral Daily  . atorvastatin  80 mg Oral q1800  . [START ON 06/24/2017] cinacalcet  30 mg Oral Daily  . [START ON 06/24/2017] collagenase  1 application Topical Daily  . [START ON 06/24/2017] DIALYVITE TABLET  1 tablet Oral QPC supper  . [START ON 06/24/2017] ENSURE  237 mL Oral BID BM  . heparin  2,000 Units Intravenous Once  . [START ON 06/24/2017] lanthanum  1,000 mg Oral QPC supper  . lidocaine  1 patch Transdermal Q24H  . metoprolol succinate  25 mg Oral QHS   . heparin 650  Units/hr (06/23/17 2246)    Allergies:  Allergies  Allergen Reactions  . Aspirin Other (See Comments)    NO BLOOD THINNERS OF ANY KIND-bleeding events    Social History   Socioeconomic History  . Marital status: Widowed    Spouse name: Not on file  . Number of children: Not on file  . Years of education: Not on file  . Highest education level: Not on file  Occupational History  . Not on file  Social Needs  . Financial resource strain: Not on file  . Food insecurity:    Worry: Not on file    Inability: Not on file  . Transportation needs:    Medical: Not on file    Non-medical: Not on file  Tobacco Use  . Smoking status: Former Smoker    Types: Cigarettes  . Smokeless tobacco: Never Used  . Tobacco comment: "stopped smoking in 1969"  Substance and Sexual Activity  . Alcohol use: Not Currently    Alcohol/week: 0.0 oz    Frequency: Never    Comment: "no alcohol since 1969"  . Drug use: No  . Sexual activity: Never  Lifestyle  . Physical activity:    Days per week: Not on file    Minutes per session: Not on file  . Stress: Not  on file  Relationships  . Social connections:    Talks on phone: Not on file    Gets together: Not on file    Attends religious service: Not on file    Active member of club or organization: Not on file    Attends meetings of clubs or organizations: Not on file    Relationship status: Not on file  . Intimate partner violence:    Fear of current or ex partner: Not on file    Emotionally abused: Not on file    Physically abused: Not on file    Forced sexual activity: Not on file  Other Topics Concern  . Not on file  Social History Narrative  . Not on file     Family History  Problem Relation Age of Onset  . Cancer Mother   . Hypertension Mother      Review of Systems: All other systems reviewed and are otherwise negative except as noted above.  Labs: Recent Labs    06/23/17 1900  TROPONINI 2.20*   Lab Results  Component Value Date   WBC 3.0 (L) 04/25/2017   HGB 11.6 (L) 06/23/2017   HCT 34.0 (L) 06/23/2017   MCV 91.4 04/25/2017   PLT 114 (L) 04/25/2017    Recent Labs  Lab 06/23/17 1544  NA 139  K 3.4*  CL 98*  BUN 27*  CREATININE 3.20*  GLUCOSE 67   No results found for: CHOL, HDL, LDLCALC, TRIG No results found for: DDIMER  Radiology/Studies:  Dg Chest 2 View  Result Date: 06/23/2017 CLINICAL DATA:  Chest pain and shortness of breath which developed during interventional declotting procedure. EXAM: CHEST - 2 VIEW COMPARISON:  Chest radiograph 11/15/2015. FINDINGS: The heart is enlarged. Perihilar and lower lobe opacities are new from priors, along with small BILATERAL effusions. Etiology is uncertain. Pneumonia, pulmonary edema, or atelectasis are considerations. No osseous findings. Vascular stents are seen in the LEFT arm and LEFT central veins. LEFT axillary clips. IMPRESSION: Cardiomegaly with BILATERAL perihilar and lower lobe opacities, uncertain significance. No immediate preprocedure chest is available for correlation, but the findings have worsened  since the prior chest radiograph of 2017. Consider  CTA chest for further evaluation. Electronically Signed   By: Staci Righter M.D.   On: 06/23/2017 19:07   Dg Cervical Spine Complete  Result Date: 05/30/2017 CLINICAL DATA:  84 y/o F; fall 1 month ago with pain on the right side. Stiffness on the left side. EXAM: CERVICAL SPINE - COMPLETE 4+ VIEW COMPARISON:  None. FINDINGS: Oblique and frontal radiographs of the cervical spine. Left subclavian vascular stent noted. Anterior fusion hardware of upper cervical spine appears intact. No acute fracture or malalignment identified given limited views. Dense calcific atherosclerosis and carotid bifurcations. IMPRESSION: No acute fracture or malalignment identified on limited views. Anterior cervical fusion hardware is intact. If clinically indicated consider CT of the cervical spine for better characterization. Electronically Signed   By: Kristine Garbe M.D.   On: 05/30/2017 17:16   Ct Thoracic Spine Wo Contrast  Result Date: 05/30/2017 CLINICAL DATA:  84 y/o F; mechanical fall 1 month ago with persistent shoulder pain, lower back pain, buttocks pain. EXAM: CT THORACIC SPINE WITHOUT CONTRAST TECHNIQUE: Multidetector CT images of the thoracic were obtained using the standard protocol without intravenous contrast. COMPARISON:  None. FINDINGS: Alignment: Minimal dextrocurvature.  Normal kyphosis. Vertebrae: No acute fracture or focal pathologic process. Paraspinal and other soft tissues: Atrophic kidneys with multiple small cysts. Large bilateral pleural effusions. Interstitial pulmonary edema. Patchy opacities in the left upper lobe. Aortic atherosclerosis and severe coronary artery calcification. Disc levels: Multilevel discogenic degenerative changes with mild loss of intervertebral disc space height, disc calcifications, endplate marginal osteophytes. No high-grade bony canal or foraminal stenosis. IMPRESSION: 1. No acute fracture or dislocation of  thoracic spine. 2. Mild thoracic spine spondylosis. 3. Large pleural effusions and interstitial pulmonary edema. Left upper lobe patchy opacities may represent alveolar edema or superimposed pneumonia. 4. Severe coronary artery calcification.  Aortic atherosclerosis. Electronically Signed   By: Kristine Garbe M.D.   On: 05/30/2017 17:08   Ct Lumbar Spine Wo Contrast  Result Date: 05/30/2017 CLINICAL DATA:  84 y/o F; fall 1 month ago with lower back and buttocks pain. EXAM: CT LUMBAR SPINE WITHOUT CONTRAST TECHNIQUE: Multidetector CT imaging of the lumbar spine was performed without intravenous contrast administration. Multiplanar CT image reconstructions were also generated. COMPARISON:  None. FINDINGS: Segmentation: 5 lumbar type vertebrae. Alignment: Grade 1 L4-5 anterolisthesis. Vertebrae: Bones are demineralized. No acute fracture or dislocation. Right S1 segment well-circumscribed sclerotic focus, likely bone island. Paraspinal and other soft tissues: Renal atrophy with multiple cysts. Severe calcific atherosclerosis of the aorta and its branch vessels. Dilatation of the distal sigmoid and rectum. Disc levels: Multilevel disc and facet degenerative changes greatest at the L4-5 level at L5-S1 level where disc bulges, endplate marginal osteophytes, and facet hypertrophy result in mild-to-moderate foraminal stenosis and there is moderate to severe L4-5 canal stenosis. IMPRESSION: 1. No acute fracture identified. 2. Grade 1 L4-5 anterolisthesis. 3. Lumbar spondylosis greatest at L4-5 level where there is mild-to-moderate foraminal stenosis and moderate to severe canal stenosis. 4. Aortic atherosclerosis. 5. Distal sigmoid colon and rectum distention may represent constipation. Electronically Signed   By: Kristine Garbe M.D.   On: 05/30/2017 17:13   Ir US Guide Vasc Access Left  Result Date: 06/23/2017 INDICATION: 84 year old female with end-stage renal disease on hemodialysis via a left  upper extremity arteriovenous graft. Her access has clotted, she has not dialyzed since Monday. EXAM: 1. Ultrasound-guided antegrade access 2. Ultrasound-guided retrograde access 3. Central venogram 4. Thrombolysis/thrombectomy declot procedure using tPA, the Angiojet and the Arrow percutaneous thrombectomy  device 5. Angioplasty of in stent stenosis within the left innominate vein stent system. MEDICATIONS: 5000 units heparin ANESTHESIA/SEDATION: No sedation. FLUOROSCOPY TIME:  Fluoroscopy Time: 11 minutes 42 seconds (45 mGy). COMPLICATIONS: None immediate. Patient did experience some chest pain and shortness of breath during the procedure as well as complaining of buttock pain, possibly due to a sacral decubitus ulcer. She also notes that she has no one at home to stay with her. For these reasons, after the procedure we transferred her to the emergency room for further evaluation prior to discharge. PROCEDURE: Informed written consent was obtained from the patient after a thorough discussion of the procedural risks, benefits and alternatives. All questions were addressed. Maximal Sterile Barrier Technique was utilized including caps, mask, sterile gowns, sterile gloves, sterile drape, hand hygiene and skin antiseptic. A timeout was performed prior to the initiation of the procedure. The left upper arm graft was interrogated with ultrasound and found to be completely thrombosed. An image was obtained and stored for the medical record. Local anesthesia was attained by infiltration with 1% lidocaine. A small dermatotomy was made. Under real-time sonographic guidance, the graft was punctured with a 21 gauge micropuncture needle in antegrade fashion adjacent to the arterial anastomosis. Using standard technique, the initial micro needle was exchanged over a 0.018 micro wire for a transitional 4 [place] micro sheath. 2 mg of tPA was then instilled into the thrombus. A second retrograde access was identified farther up the  arm. Local anesthesia was attained by infiltration with 1% lidocaine. A small dermatotomy was made. Under real-time sonographic guidance, the vessel was punctured with a 21 gauge micropuncture needle. Using standard technique, the initial micro needle was exchanged over a 0.018 micro wire for a transitional 4 [place] micro sheath. Another 2 mg of tPA was instilled into the thrombus from the retrograde access. Attention was again turned to the antegrade access adjacent to the arterial anastomosis. A short Amplatz wire was advanced through the transitional micro sheath in the micro sheath was exchanged for a working 7 [place] vascular sheath. An angled catheter was then advanced over the wire and into the left subclavian vein. A central venogram was performed. There is a a combination of likely in stent stenosis and thrombus within the left innominate and subclavian venous stent system resulting in severe limitation of flow. Nonocclusive thrombus extends from the peripheral margin of the stent system throughout the left subclavian and axillary veins. Pull-back left upper extremity venogram confirms the extent of the thrombus as well as thrombus within the graft. Due to the volume of thrombus, the decision was made to proceed with debulking of the thrombus using the Angiojet. The catheter was navigated through the nearly occluded central stent system using a Bentson wire. The Bentson wire was then exchanged for a Rosen wire which was placed in the superior vena cava. A 6 French 90 cm Angiojet was then advanced over the wire and the segments of vein from the innominate stent to the stent in the mid arm was thrombectomized. Multiple passes of the Angiojet was performed. Follow-up venography demonstrates marked reduction in clot burden. There is only some mild residual InStent stenosis within the stent system. No definite clot identified. The catheter was then removed. The percutaneous thrombectomy device was then advanced  into the graft and the graft was thrombectomized using the device. Attention was then turned to the retrograde graft access. The micro sheath was exchanged over a short Amplatz wire for a working 6 [place] vascular sheath.  The percutaneous thrombectomy device was then advanced into the brachial artery and used in basket fashion to pull the arterial plug back into the body of the graft where it was macerated. Aspiration was then performed. Both the antegrade and retrograde sheath demonstrate brisk bleeding consistent with restoration of flow. The angled catheter was then advanced over a Bentson wire into the brachial artery and a brachial arteriogram was performed. This confirms patency of the arterial anastomosis, the graft and the draining vein. There is persistent in stent stenosis versus peripheral adherent thrombus within the left subclavian stent system. Angioplasty was performed using a 10 x 40 mm Conquest balloon. Following angioplasty, there is some reduction of flow secondary to residual thrombus. The balloon was removed. The Angiojet device was reinserted. Additional thrombectomy was performed through the Angiojet within the stent system. Final arteriography demonstrates brisk flow with minimal residual filling defect. There is a palpable thrill at the graft. IMPRESSION: 1. 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 2. Successful pharmacomechanical thrombolysis/thrombectomy procedure using a combination of tPA, Angiojet and the Arrow percutaneous thrombectomy device. 3. Successful angioplasty of in stent stenosis within the left innominate stent system using a 10 x 40 mm Conquest balloon. ACCESS: This access remains amenable to future percutaneous interventions as clinically indicated. Electronically Signed   By: Jacqulynn Cadet M.D.   On: 06/23/2017 17:06   Ir Thrombectomy Av Fistula W/thrombolysis Inc/shunt/img Left  Result Date:  06/23/2017 INDICATION: 84 year old female with end-stage renal disease on hemodialysis via a left upper extremity arteriovenous graft. Her access has clotted, she has not dialyzed since Monday. EXAM: 1. Ultrasound-guided antegrade access 2. Ultrasound-guided retrograde access 3. Central venogram 4. Thrombolysis/thrombectomy declot procedure using tPA, the Angiojet and the Arrow percutaneous thrombectomy device 5. Angioplasty of in stent stenosis within the left innominate vein stent system. MEDICATIONS: 5000 units heparin ANESTHESIA/SEDATION: No sedation. FLUOROSCOPY TIME:  Fluoroscopy Time: 11 minutes 42 seconds (45 mGy). COMPLICATIONS: None immediate. Patient did experience some chest pain and shortness of breath during the procedure as well as complaining of buttock pain, possibly due to a sacral decubitus ulcer. She also notes that she has no one at home to stay with her. For these reasons, after the procedure we transferred her to the emergency room for further evaluation prior to discharge. PROCEDURE: Informed written consent was obtained from the patient after a thorough discussion of the procedural risks, benefits and alternatives. All questions were addressed. Maximal Sterile Barrier Technique was utilized including caps, mask, sterile gowns, sterile gloves, sterile drape, hand hygiene and skin antiseptic. A timeout was performed prior to the initiation of the procedure. The left upper arm graft was interrogated with ultrasound and found to be completely thrombosed. An image was obtained and stored for the medical record. Local anesthesia was attained by infiltration with 1% lidocaine. A small dermatotomy was made. Under real-time sonographic guidance, the graft was punctured with a 21 gauge micropuncture needle in antegrade fashion adjacent to the arterial anastomosis. Using standard technique, the initial micro needle was exchanged over a 0.018 micro wire for a transitional 4 [place] micro sheath. 2 mg of  tPA was then instilled into the thrombus. A second retrograde access was identified farther up the arm. Local anesthesia was attained by infiltration with 1% lidocaine. A small dermatotomy was made. Under real-time sonographic guidance, the vessel was punctured with a 21 gauge micropuncture needle. Using standard technique, the initial micro needle was exchanged over a 0.018 micro  wire for a transitional 4 [place] micro sheath. Another 2 mg of tPA was instilled into the thrombus from the retrograde access. Attention was again turned to the antegrade access adjacent to the arterial anastomosis. A short Amplatz wire was advanced through the transitional micro sheath in the micro sheath was exchanged for a working 7 [place] vascular sheath. An angled catheter was then advanced over the wire and into the left subclavian vein. A central venogram was performed. There is a a combination of likely in stent stenosis and thrombus within the left innominate and subclavian venous stent system resulting in severe limitation of flow. Nonocclusive thrombus extends from the peripheral margin of the stent system throughout the left subclavian and axillary veins. Pull-back left upper extremity venogram confirms the extent of the thrombus as well as thrombus within the graft. Due to the volume of thrombus, the decision was made to proceed with debulking of the thrombus using the Angiojet. The catheter was navigated through the nearly occluded central stent system using a Bentson wire. The Bentson wire was then exchanged for a Rosen wire which was placed in the superior vena cava. A 6 French 90 cm Angiojet was then advanced over the wire and the segments of vein from the innominate stent to the stent in the mid arm was thrombectomized. Multiple passes of the Angiojet was performed. Follow-up venography demonstrates marked reduction in clot burden. There is only some mild residual InStent stenosis within the stent system. No definite clot  identified. The catheter was then removed. The percutaneous thrombectomy device was then advanced into the graft and the graft was thrombectomized using the device. Attention was then turned to the retrograde graft access. The micro sheath was exchanged over a short Amplatz wire for a working 6 [place] vascular sheath. The percutaneous thrombectomy device was then advanced into the brachial artery and used in basket fashion to pull the arterial plug back into the body of the graft where it was macerated. Aspiration was then performed. Both the antegrade and retrograde sheath demonstrate brisk bleeding consistent with restoration of flow. The angled catheter was then advanced over a Bentson wire into the brachial artery and a brachial arteriogram was performed. This confirms patency of the arterial anastomosis, the graft and the draining vein. There is persistent in stent stenosis versus peripheral adherent thrombus within the left subclavian stent system. Angioplasty was performed using a 10 x 40 mm Conquest balloon. Following angioplasty, there is some reduction of flow secondary to residual thrombus. The balloon was removed. The Angiojet device was reinserted. Additional thrombectomy was performed through the Angiojet within the stent system. Final arteriography demonstrates brisk flow with minimal residual filling defect. There is a palpable thrill at the graft. IMPRESSION: 1. 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 2. Successful pharmacomechanical thrombolysis/thrombectomy procedure using a combination of tPA, Angiojet and the Arrow percutaneous thrombectomy device. 3. Successful angioplasty of in stent stenosis within the left innominate stent system using a 10 x 40 mm Conquest balloon. ACCESS: This access remains amenable to future percutaneous interventions as clinically indicated. Electronically Signed   By: Jacqulynn Cadet M.D.   On:  06/23/2017 17:06   Ir Pta Addl Central Dialysis Seg Thru Dialy Circuit Left  Result Date: 06/23/2017 INDICATION: 84 year old female with end-stage renal disease on hemodialysis via a left upper extremity arteriovenous graft. Her access has clotted, she has not dialyzed since Monday. EXAM: 1. Ultrasound-guided antegrade access 2. Ultrasound-guided retrograde access 3.  Central venogram 4. Thrombolysis/thrombectomy declot procedure using tPA, the Angiojet and the Arrow percutaneous thrombectomy device 5. Angioplasty of in stent stenosis within the left innominate vein stent system. MEDICATIONS: 5000 units heparin ANESTHESIA/SEDATION: No sedation. FLUOROSCOPY TIME:  Fluoroscopy Time: 11 minutes 42 seconds (45 mGy). COMPLICATIONS: None immediate. Patient did experience some chest pain and shortness of breath during the procedure as well as complaining of buttock pain, possibly due to a sacral decubitus ulcer. She also notes that she has no one at home to stay with her. For these reasons, after the procedure we transferred her to the emergency room for further evaluation prior to discharge. PROCEDURE: Informed written consent was obtained from the patient after a thorough discussion of the procedural risks, benefits and alternatives. All questions were addressed. Maximal Sterile Barrier Technique was utilized including caps, mask, sterile gowns, sterile gloves, sterile drape, hand hygiene and skin antiseptic. A timeout was performed prior to the initiation of the procedure. The left upper arm graft was interrogated with ultrasound and found to be completely thrombosed. An image was obtained and stored for the medical record. Local anesthesia was attained by infiltration with 1% lidocaine. A small dermatotomy was made. Under real-time sonographic guidance, the graft was punctured with a 21 gauge micropuncture needle in antegrade fashion adjacent to the arterial anastomosis. Using standard technique, the initial micro  needle was exchanged over a 0.018 micro wire for a transitional 4 [place] micro sheath. 2 mg of tPA was then instilled into the thrombus. A second retrograde access was identified farther up the arm. Local anesthesia was attained by infiltration with 1% lidocaine. A small dermatotomy was made. Under real-time sonographic guidance, the vessel was punctured with a 21 gauge micropuncture needle. Using standard technique, the initial micro needle was exchanged over a 0.018 micro wire for a transitional 4 [place] micro sheath. Another 2 mg of tPA was instilled into the thrombus from the retrograde access. Attention was again turned to the antegrade access adjacent to the arterial anastomosis. A short Amplatz wire was advanced through the transitional micro sheath in the micro sheath was exchanged for a working 7 [place] vascular sheath. An angled catheter was then advanced over the wire and into the left subclavian vein. A central venogram was performed. There is a a combination of likely in stent stenosis and thrombus within the left innominate and subclavian venous stent system resulting in severe limitation of flow. Nonocclusive thrombus extends from the peripheral margin of the stent system throughout the left subclavian and axillary veins. Pull-back left upper extremity venogram confirms the extent of the thrombus as well as thrombus within the graft. Due to the volume of thrombus, the decision was made to proceed with debulking of the thrombus using the Angiojet. The catheter was navigated through the nearly occluded central stent system using a Bentson wire. The Bentson wire was then exchanged for a Rosen wire which was placed in the superior vena cava. A 6 French 90 cm Angiojet was then advanced over the wire and the segments of vein from the innominate stent to the stent in the mid arm was thrombectomized. Multiple passes of the Angiojet was performed. Follow-up venography demonstrates marked reduction in clot  burden. There is only some mild residual InStent stenosis within the stent system. No definite clot identified. The catheter was then removed. The percutaneous thrombectomy device was then advanced into the graft and the graft was thrombectomized using the device. Attention was then turned to the retrograde graft access. The micro  sheath was exchanged over a short Amplatz wire for a working 6 [place] vascular sheath. The percutaneous thrombectomy device was then advanced into the brachial artery and used in basket fashion to pull the arterial plug back into the body of the graft where it was macerated. Aspiration was then performed. Both the antegrade and retrograde sheath demonstrate brisk bleeding consistent with restoration of flow. The angled catheter was then advanced over a Bentson wire into the brachial artery and a brachial arteriogram was performed. This confirms patency of the arterial anastomosis, the graft and the draining vein. There is persistent in stent stenosis versus peripheral adherent thrombus within the left subclavian stent system. Angioplasty was performed using a 10 x 40 mm Conquest balloon. Following angioplasty, there is some reduction of flow secondary to residual thrombus. The balloon was removed. The Angiojet device was reinserted. Additional thrombectomy was performed through the Angiojet within the stent system. Final arteriography demonstrates brisk flow with minimal residual filling defect. There is a palpable thrill at the graft. IMPRESSION: 1. 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 2. Successful pharmacomechanical thrombolysis/thrombectomy procedure using a combination of tPA, Angiojet and the Arrow percutaneous thrombectomy device. 3. Successful angioplasty of in stent stenosis within the left innominate stent system using a 10 x 40 mm Conquest balloon. ACCESS: This access remains amenable to future  percutaneous interventions as clinically indicated. Electronically Signed   By: Jacqulynn Cadet M.D.   On: 06/23/2017 17:06   Dg Hips Bilat W Or Wo Pelvis 3-4 Views  Result Date: 05/30/2017 CLINICAL DATA:  [place] a month ago, RIGHT-side pain, LEFT side stiffness EXAM: DG HIP (WITH OR WITHOUT PELVIS) 3-4V BILAT COMPARISON:  Abdominal radiograph 11/15/2015 FINDINGS: Severe osseous demineralization. Narrowing of the hip joints bilaterally. Foreshortening of the LEFT femoral neck suspicious for an impacted subcapital fracture. No additional fracture, dislocation or bone destruction. Severe diffuse atherosclerotic calcification of large and small vessels in pelvis. IMPRESSION: Severe osseous demineralization with suspected impacted subcapital fracture of the LEFT femoral neck. Electronically Signed   By: Lavonia Dana M.D.   On: 05/30/2017 17:15    Wt Readings from Last 3 Encounters:  06/23/17 53.5 kg (118 lb)  05/05/17 53.7 kg (118 lb 6.4 oz)  03/21/17 55.8 kg (123 lb)    EKG: Appears to be normal sinus rhythm, although significant artifact.  Left bundle branch block (as seen on prior ECGs).  Physical Exam: Blood pressure (!) 111/57, pulse 73, temperature 98.1 F (36.7 C), temperature source Oral, resp. rate 19, height 5\' 3"  (1.6 m), weight 53.5 kg (118 lb), SpO2 99 %. Body mass index is 20.9 kg/m. General: Chronically ill-appearing woman in no acute distress. Head: Normocephalic, atraumatic, sclera non-icteric, no xanthomas, nares are without discharge.  Neck: Negative for carotid bruits. JVD not elevated. Lungs: Clear bilaterally to auscultation without wheezes, rales, or rhonchi. Breathing is unlabored. Heart: RRR with diminished S1 and S2. No murmurs, rubs, or gallops appreciated. Abdomen: Soft, non-tender, non-distended with normoactive bowel sounds. No hepatomegaly. No rebound/guarding. No obvious abdominal masses. Msk:  Strength and tone appear normal for age. Extremities: Left upper  extremity with bandage in place over fistula site.  2+ nonpitting edema present over the left upper extremity.  This post bilateral lower extremity amputations. Neuro: Alert and oriented X 3. No facial asymmetry. No focal deficit. Moves all extremities spontaneously. Psych:  Responds to questions appropriately with a normal affect.     Assessment and Plan  36F  with a history of end-stage renal disease on HD, peripheral vascular disease status post bilateral lower extremity amputations, diabetes, hypertension, HCV, who presents with chest pain during elective thrombectomy of left upper extremity fistula, and was found to have NSTEMI.  She is currently chest pain-free.  Her ECG shows old left bundle branch block.  There is significant baseline artifact in this ECG so would obtain a repeat presently.  Would recommend starting her on therapeutic heparin drip for management of presumed ACS.  Plan for echo and cath in the morning.  In the unlikely event that her coronaries show no obstructive disease, would consider the possibility of PE as an alternative, especially given her thrombectomy procedure earlier today.  Cardiology will continue to follow along with you.  Signed, Doylene Canning, MD 06/23/2017, 10:47 PM

## 2017-06-23 NOTE — Progress Notes (Signed)
Voicemail left at [phone]- [phone] for [place]

## 2017-06-23 NOTE — H&P (Signed)
History and Physical    [name]:[id number] DOB: 1932/10/24 DOA: 06/23/2017  Referring MD/NP/PA:   PCP: [name], MD   Patient coming from:  The patient is coming from home.  At baseline, pt is partially dependent for most of ADL.   Chief Complaint: chest pain  HPI: [name] is a 84 y.o. female with medical history significant of bilateral s/p of AKA, hypertension, hyperlipidemia, diet-controlled diabetes, PVD, ESRD-HD, anemia, HCV, bilateral breast cancer,who presents with chest pain.  Pt underwent thrombectomy of left upper extremity dialysis graft by IR. During the procedure, she developed SOB and CP. The chest pains located in the left side of the chest, moderate, pressure-like, nonradiating. No cough, fever or chills. She states that her CP and dyspnea resolved at the completion of the procedure. Currently she has some mild shortness of breath, but no chest pain. Patient has nausea and vomited once. Currently she does not have nausea, vomiting, diarrhea or abdominal pain. She has sacral ulcer and complains buttock pain. Pt had CBG 58 in ED-->67 on BMP.  ED Course: pt was found to have troponin 2.20, potassium is 3.4,creatinine 3.20, BUN 27, pending CBC, temperature normal, no tachycardia, O2 99% on room air, chest x-ray showed cardiomegaly. Patient is admitted to stepdown as inpatient. Cardiology will be consulted by EDP.  Review of Systems:   General: no fevers, chills, no body weight gain, has poor appetite, has fatigue HEENT: no blurry vision, hearing changes or sore throat Respiratory: has dyspnea, no coughing, wheezing CV: has chest pain, no palpitations GI: had nausea, vomiting, no abdominal pain, diarrhea, constipation GU: no dysuria, burning on urination, increased urinary frequency, hematuria  Ext: no leg edema. S/p of bilateral AKA Neuro: no unilateral weakness, numbness, or tingling, no vision change or hearing loss Skin: has sacral ulcer MSK:  No muscle spasm, no deformity, no limitation of range of movement in spin Heme: No easy bruising.  Travel history: No recent long distant travel.  Allergy:  Allergies  Allergen Reactions  . Aspirin Other (See Comments)    NO BLOOD THINNERS OF ANY KIND-bleeding events    Past Medical History:  Diagnosis Date  . Anemia   . Arthritis   . Bilateral breast cancer ([place])   . DM (diabetes mellitus) ([place])   . Dyslipidemia   . ESRD (end stage renal disease) on dialysis ([place])    a. [place]: MWF (07/10/2014)  . Hepatitis C   . Hyperparathyroidism   . Hypertension   . PVD (peripheral vascular disease) ([place])    a. s/p peripheral angiogram on 07/10/14 with no PCI and cont med Rx  . Vascular disease     Past Surgical History:  Procedure Laterality Date  . ABDOMINAL HYSTERECTOMY    . AMPUTATION Bilateral 11/21/2015   Procedure: AMPUTATION ABOVE KNEE;  Surgeon: Newt Minion, MD;  Location: [facility];  Service: Orthopedics;  Laterality: Bilateral;  . APPENDECTOMY    . ARTERIOVENOUS GRAFT PLACEMENT Left 03/20/09   "had right arm previously but couldn't use it anymore"  . BACK SURGERY    . BREAST BIOPSY Bilateral   . BREAST BIOPSY Right 08/08/2013   Procedure: Evacuation Hematoma Right Chest;  Surgeon: [name], MD;  Location: [facility];  Service: General;  Laterality: Right;  . CATARACT EXTRACTION Right   . DG AV DIALYSIS GRAFT DECLOT OR Left 01/27/11, 02/15/11, 03/15/11, 10/13/11   lua  . EVACUATION BREAST HEMATOMA  2001; 2015   left; right  .  INSERTION OF DIALYSIS CATHETER Right 11/15/2015   Procedure: INSERTION OF Right Femoral  DIALYSIS CATHETER.;  Surgeon: Rosetta Posner, MD;  Location: [facility] OR;  Service: Vascular;  Laterality: Right;  . IR PTA ADDL CENTRAL DIALYSIS SEG THRU DIALY CIRCUIT LEFT Left 06/23/2017  . IR THROMBECTOMY AV FISTULA W/THROMBOLYSIS INC/SHUNT/IMG LEFT Left 06/23/2017  . IR US GUIDE VASC ACCESS LEFT  06/23/2017  . MASTECTOMY Left 1970's?  Marland Kitchen MASTECTOMY COMPLETE / SIMPLE Right  08/07/2013  . MEDIAN NERVE REPAIR Left    "decompression"  . PERIPHERAL VASCULAR CATHETERIZATION N/A 07/10/2014   Procedure: Abdominal Aortogram;  Surgeon: Wellington Hampshire, MD;  Location: [place] INVASIVE CV LAB CUPID;  Service: Cardiovascular;  Laterality: N/A;  . POSTERIOR FUSION CERVICAL SPINE     "have 6 screws"  . THROMBECTOMY AND REVISION OF ARTERIOVENTOUS (AV) GORETEX  GRAFT Left 04/18/2009  . THROMBECTOMY AND REVISION OF ARTERIOVENTOUS (AV) GORETEX  GRAFT Left 11/15/2015   Procedure: Thrombectomy and Revision of Left ARm AV Gortex Graft.;  Surgeon: Rosetta Posner, MD;  Location: [facility];  Service: Vascular;  Laterality: Left;  . TOE AMPUTATION Right    1,2nd toes  . TONSILLECTOMY    . TOTAL MASTECTOMY Right 08/07/2013   Procedure: RIGHT TOTAL MASTECTOMY;  Surgeon: [name], MD;  Location: [place];  Service: General;  Laterality: Right;  . UTERINE FIBROID SURGERY      Social History:  reports that she has quit smoking. Her smoking use included cigarettes. She has never used smokeless tobacco. She reports that she drank alcohol. She reports that she does not use drugs.  Family History:  Family History  Problem Relation Age of Onset  . Cancer Mother   . Hypertension Mother      Prior to Admission medications   Medication Sig Start Date End Date Taking? Authorizing Provider  anastrozole (ARIMIDEX) 1 MG tablet TAKE 1 TABLET BY MOUTH DAILY 06/01/17  Yes Shadad, Mathis Dad, MD  B Complex-C-Folic Acid (DIALYVITE TABLET) TABS Take 1 tablet by mouth daily after supper. 10/31/15  Yes [provider]  cinacalcet (SENSIPAR) 30 MG tablet Take 2 tablets (60 mg total) by mouth every Monday, Wednesday, and Friday with hemodialysis. Patient taking differently: Take 30 mg by mouth daily.  08/27/16  Yes Theodis Blaze, MD  collagenase (SANTYL) ointment Apply topically daily. Patient taking differently: Apply 1 application topically daily.  08/26/16  Yes Theodis Blaze, MD  ENSURE (ENSURE) Take 237 mLs  by mouth 2 (two) times daily between meals.   Yes [provider]  lanthanum (FOSRENOL) 1000 MG chewable tablet Chew 1,000 mg by mouth daily after supper.    Yes [provider]  lidocaine (LIDODERM) 5 % Place 1 patch onto the skin daily. Remove & Discard patch within 12 hours or as directed by MD 05/30/17  Yes Leaphart, Zack Seal, PA-C  metoprolol succinate (TOPROL XL) 25 MG 24 hr tablet Take 0.5 tablets (12.5 mg total) by mouth daily. Patient taking differently: Take 25 mg by mouth at bedtime.  08/27/16  Yes Theodis Blaze, MD  oxyCODONE-acetaminophen (PERCOCET) 5-325 MG tablet Take 1 tablet by mouth every 6 (six) hours as needed for severe pain. 05/30/17  Yes Little, Wenda Overland, MD  Amino Acids-Protein Hydrolys (FEEDING SUPPLEMENT, PRO-STAT SUGAR FREE 64,) LIQD Take 30 mLs by mouth 2 (two) times daily. Patient not taking: Reported on 03/21/2017 08/26/16   Theodis Blaze, MD  calcitRIOL (ROCALTROL) 0.25 MCG capsule Take 3 capsules (0.75 mcg  total) by mouth every Monday, Wednesday, and Friday with hemodialysis. Patient not taking: Reported on 06/23/2017 08/27/16   Theodis Blaze, MD  Nutritional Supplements (FEEDING SUPPLEMENT, NEPRO CARB STEADY,) LIQD Take 237 mLs by mouth 2 (two) times daily between meals. Patient not taking: Reported on 03/21/2017 11/24/15   Elgergawy, Silver Huguenin, MD  oxyCODONE (OXY IR/ROXICODONE) 5 MG immediate release tablet Take 1 tablet (5 mg total) by mouth every 4 (four) hours as needed for moderate pain. Patient not taking: Reported on 03/21/2017 08/26/16   Theodis Blaze, MD    Physical Exam: Vitals:   06/23/17 2115 06/23/17 2145 06/23/17 2215 06/23/17 2230  BP: (!) 109/55 (!) 111/57 (!) 109/59 (!) 124/58  Pulse:      Resp: (!) 21 19 19  (!) 24  Temp:      TempSrc:      SpO2:      Weight:      Height:       General: Not in acute distress HEENT:       Eyes: PERRL, EOMI, no scleral icterus.       ENT: No discharge from the ears and nose, no pharynx  injection, no tonsillar enlargement.        Neck: No JVD, no bruit, no mass felt. Heme: No neck lymph node enlargement. Cardiac: S1/S2, RRR, No murmurs, No gallops or rubs. Respiratory: No rales, wheezing, rhonchi or rubs. GI: Soft, nondistended, nontender, no rebound pain, no organomegaly, BS present. GU: No hematuria Ext: No pitting leg edema bilaterally. S/p of bilateral AKA Musculoskeletal: No joint deformities, No joint redness or warmth, no limitation of ROM in spin. Skin: sacral ulcer-stage II Neuro: Alert, oriented X3, cranial nerves II-XII grossly intact, moves all extremities normally Psych: Patient is not psychotic, no suicidal or hemocidal ideation.  Labs on Admission: I have personally reviewed following labs and imaging studies  CBC: Recent Labs  Lab 06/23/17 1535 06/23/17 1544  HGB 8.8* 11.6*  HCT 26.0* 38.1*   Basic Metabolic Panel: Recent Labs  Lab 06/23/17 1535 06/23/17 1544  NA 101* 139  K 3.6 3.4*  CL 87* 98*  GLUCOSE 38* 67  BUN 34* 27*  CREATININE 2.50* 3.20*   GFR: Estimated Creatinine Clearance: 11.2 mL/min (A) (by C-G formula based on SCr of 3.2 mg/dL (H)). Liver Function Tests: No results for input(s): AST, ALT, ALKPHOS, BILITOT, PROT, ALBUMIN in the last 168 hours. No results for input(s): LIPASE, AMYLASE in the last 168 hours. No results for input(s): AMMONIA in the last 168 hours. Coagulation Profile: No results for input(s): INR, PROTIME in the last 168 hours. Cardiac Enzymes: Recent Labs  Lab 06/23/17 1900  TROPONINI 2.20*   BNP (last 3 results) No results for input(s): PROBNP in the last 8760 hours. HbA1C: No results for input(s): HGBA1C in the last 72 hours. CBG: Recent Labs  Lab 06/23/17 1303 06/23/17 1402 06/23/17 1431  GLUCAP 58* 59* 98   Lipid Profile: No results for input(s): CHOL, HDL, LDLCALC, TRIG, CHOLHDL, LDLDIRECT in the last 72 hours. Thyroid Function Tests: No results for input(s): TSH, T4TOTAL, FREET4,  T3FREE, THYROIDAB in the last 72 hours. Anemia Panel: No results for input(s): VITAMINB12, FOLATE, FERRITIN, TIBC, IRON, RETICCTPCT in the last 72 hours. Urine analysis: No results found for: COLORURINE, APPEARANCEUR, LABSPEC, PHURINE, GLUCOSEU, HGBUR, BILIRUBINUR, KETONESUR, PROTEINUR, UROBILINOGEN, NITRITE, LEUKOCYTESUR Sepsis Labs: @LABRCNTIP (procalcitonin:4,lacticidven:4) )No results found for this or any previous visit (from the past 240 hour(s)).   Radiological Exams on Admission: Dg Chest 2  View  Result Date: 06/23/2017 CLINICAL DATA:  Chest pain and shortness of breath which developed during interventional declotting procedure. EXAM: CHEST - 2 VIEW COMPARISON:  Chest radiograph 11/15/2015. FINDINGS: The heart is enlarged. Perihilar and lower lobe opacities are new from priors, along with small BILATERAL effusions. Etiology is uncertain. Pneumonia, pulmonary edema, or atelectasis are considerations. No osseous findings. Vascular stents are seen in the LEFT arm and LEFT central veins. LEFT axillary clips. IMPRESSION: Cardiomegaly with BILATERAL perihilar and lower lobe opacities, uncertain significance. No immediate preprocedure chest is available for correlation, but the findings have worsened since the prior chest radiograph of 2017. Consider CTA chest for further evaluation. Electronically Signed   By: Staci Righter M.D.   On: 06/23/2017 19:07   Ir US Guide Vasc Access Left  Result Date: 06/23/2017 INDICATION: 84 year old female with end-stage renal disease on hemodialysis via a left upper extremity arteriovenous graft. Her access has clotted, she has not dialyzed since Monday. EXAM: 1. Ultrasound-guided antegrade access 2. Ultrasound-guided retrograde access 3. Central venogram 4. Thrombolysis/thrombectomy declot procedure using tPA, the Angiojet and the Arrow percutaneous thrombectomy device 5. Angioplasty of in stent stenosis within the left innominate vein stent system. MEDICATIONS: 5000  units heparin ANESTHESIA/SEDATION: No sedation. FLUOROSCOPY TIME:  Fluoroscopy Time: 11 minutes 42 seconds (45 mGy). COMPLICATIONS: None immediate. Patient did experience some chest pain and shortness of breath during the procedure as well as complaining of buttock pain, possibly due to a sacral decubitus ulcer. She also notes that she has no one at home to stay with her. For these reasons, after the procedure we transferred her to the emergency room for further evaluation prior to discharge. PROCEDURE: Informed written consent was obtained from the patient after a thorough discussion of the procedural risks, benefits and alternatives. All questions were addressed. Maximal Sterile Barrier Technique was utilized including caps, mask, sterile gowns, sterile gloves, sterile drape, hand hygiene and skin antiseptic. A timeout was performed prior to the initiation of the procedure. The left upper arm graft was interrogated with ultrasound and found to be completely thrombosed. An image was obtained and stored for the medical record. Local anesthesia was attained by infiltration with 1% lidocaine. A small dermatotomy was made. Under real-time sonographic guidance, the graft was punctured with a 21 gauge micropuncture needle in antegrade fashion adjacent to the arterial anastomosis. Using standard technique, the initial micro needle was exchanged over a 0.018 micro wire for a transitional 4 [place] micro sheath. 2 mg of tPA was then instilled into the thrombus. A second retrograde access was identified farther up the arm. Local anesthesia was attained by infiltration with 1% lidocaine. A small dermatotomy was made. Under real-time sonographic guidance, the vessel was punctured with a 21 gauge micropuncture needle. Using standard technique, the initial micro needle was exchanged over a 0.018 micro wire for a transitional 4 [place] micro sheath. Another 2 mg of tPA was instilled into the thrombus from the retrograde access.  Attention was again turned to the antegrade access adjacent to the arterial anastomosis. A short Amplatz wire was advanced through the transitional micro sheath in the micro sheath was exchanged for a working 7 [place] vascular sheath. An angled catheter was then advanced over the wire and into the left subclavian vein. A central venogram was performed. There is a a combination of likely in stent stenosis and thrombus within the left innominate and subclavian venous stent system resulting in severe limitation of flow. Nonocclusive thrombus extends from the peripheral margin of  the stent system throughout the left subclavian and axillary veins. Pull-back left upper extremity venogram confirms the extent of the thrombus as well as thrombus within the graft. Due to the volume of thrombus, the decision was made to proceed with debulking of the thrombus using the Angiojet. The catheter was navigated through the nearly occluded central stent system using a Bentson wire. The Bentson wire was then exchanged for a Rosen wire which was placed in the superior vena cava. A 6 French 90 cm Angiojet was then advanced over the wire and the segments of vein from the innominate stent to the stent in the mid arm was thrombectomized. Multiple passes of the Angiojet was performed. Follow-up venography demonstrates marked reduction in clot burden. There is only some mild residual InStent stenosis within the stent system. No definite clot identified. The catheter was then removed. The percutaneous thrombectomy device was then advanced into the graft and the graft was thrombectomized using the device. Attention was then turned to the retrograde graft access. The micro sheath was exchanged over a short Amplatz wire for a working 6 [place] vascular sheath. The percutaneous thrombectomy device was then advanced into the brachial artery and used in basket fashion to pull the arterial plug back into the body of the graft where it was macerated.  Aspiration was then performed. Both the antegrade and retrograde sheath demonstrate brisk bleeding consistent with restoration of flow. The angled catheter was then advanced over a Bentson wire into the brachial artery and a brachial arteriogram was performed. This confirms patency of the arterial anastomosis, the graft and the draining vein. There is persistent in stent stenosis versus peripheral adherent thrombus within the left subclavian stent system. Angioplasty was performed using a 10 x 40 mm Conquest balloon. Following angioplasty, there is some reduction of flow secondary to residual thrombus. The balloon was removed. The Angiojet device was reinserted. Additional thrombectomy was performed through the Angiojet within the stent system. Final arteriography demonstrates brisk flow with minimal residual filling defect. There is a palpable thrill at the graft. IMPRESSION: 1. 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 2. Successful pharmacomechanical thrombolysis/thrombectomy procedure using a combination of tPA, Angiojet and the Arrow percutaneous thrombectomy device. 3. Successful angioplasty of in stent stenosis within the left innominate stent system using a 10 x 40 mm Conquest balloon. ACCESS: This access remains amenable to future percutaneous interventions as clinically indicated. Electronically Signed   By: Jacqulynn Cadet M.D.   On: 06/23/2017 17:06   Ir Thrombectomy Av Fistula W/thrombolysis Inc/shunt/img Left  Result Date: 06/23/2017 INDICATION: 84 year old female with end-stage renal disease on hemodialysis via a left upper extremity arteriovenous graft. Her access has clotted, she has not dialyzed since Monday. EXAM: 1. Ultrasound-guided antegrade access 2. Ultrasound-guided retrograde access 3. Central venogram 4. Thrombolysis/thrombectomy declot procedure using tPA, the Angiojet and the Arrow percutaneous thrombectomy  device 5. Angioplasty of in stent stenosis within the left innominate vein stent system. MEDICATIONS: 5000 units heparin ANESTHESIA/SEDATION: No sedation. FLUOROSCOPY TIME:  Fluoroscopy Time: 11 minutes 42 seconds (45 mGy). COMPLICATIONS: None immediate. Patient did experience some chest pain and shortness of breath during the procedure as well as complaining of buttock pain, possibly due to a sacral decubitus ulcer. She also notes that she has no one at home to stay with her. For these reasons, after the procedure we transferred her to the emergency room for further evaluation prior to discharge. PROCEDURE: Informed written consent was obtained from  the patient after a thorough discussion of the procedural risks, benefits and alternatives. All questions were addressed. Maximal Sterile Barrier Technique was utilized including caps, mask, sterile gowns, sterile gloves, sterile drape, hand hygiene and skin antiseptic. A timeout was performed prior to the initiation of the procedure. The left upper arm graft was interrogated with ultrasound and found to be completely thrombosed. An image was obtained and stored for the medical record. Local anesthesia was attained by infiltration with 1% lidocaine. A small dermatotomy was made. Under real-time sonographic guidance, the graft was punctured with a 21 gauge micropuncture needle in antegrade fashion adjacent to the arterial anastomosis. Using standard technique, the initial micro needle was exchanged over a 0.018 micro wire for a transitional 4 [place] micro sheath. 2 mg of tPA was then instilled into the thrombus. A second retrograde access was identified farther up the arm. Local anesthesia was attained by infiltration with 1% lidocaine. A small dermatotomy was made. Under real-time sonographic guidance, the vessel was punctured with a 21 gauge micropuncture needle. Using standard technique, the initial micro needle was exchanged over a 0.018 micro wire for a transitional  4 [place] micro sheath. Another 2 mg of tPA was instilled into the thrombus from the retrograde access. Attention was again turned to the antegrade access adjacent to the arterial anastomosis. A short Amplatz wire was advanced through the transitional micro sheath in the micro sheath was exchanged for a working 7 [place] vascular sheath. An angled catheter was then advanced over the wire and into the left subclavian vein. A central venogram was performed. There is a a combination of likely in stent stenosis and thrombus within the left innominate and subclavian venous stent system resulting in severe limitation of flow. Nonocclusive thrombus extends from the peripheral margin of the stent system throughout the left subclavian and axillary veins. Pull-back left upper extremity venogram confirms the extent of the thrombus as well as thrombus within the graft. Due to the volume of thrombus, the decision was made to proceed with debulking of the thrombus using the Angiojet. The catheter was navigated through the nearly occluded central stent system using a Bentson wire. The Bentson wire was then exchanged for a Rosen wire which was placed in the superior vena cava. A 6 French 90 cm Angiojet was then advanced over the wire and the segments of vein from the innominate stent to the stent in the mid arm was thrombectomized. Multiple passes of the Angiojet was performed. Follow-up venography demonstrates marked reduction in clot burden. There is only some mild residual InStent stenosis within the stent system. No definite clot identified. The catheter was then removed. The percutaneous thrombectomy device was then advanced into the graft and the graft was thrombectomized using the device. Attention was then turned to the retrograde graft access. The micro sheath was exchanged over a short Amplatz wire for a working 6 [place] vascular sheath. The percutaneous thrombectomy device was then advanced into the brachial artery and  used in basket fashion to pull the arterial plug back into the body of the graft where it was macerated. Aspiration was then performed. Both the antegrade and retrograde sheath demonstrate brisk bleeding consistent with restoration of flow. The angled catheter was then advanced over a Bentson wire into the brachial artery and a brachial arteriogram was performed. This confirms patency of the arterial anastomosis, the graft and the draining vein. There is persistent in stent stenosis versus peripheral adherent thrombus within the left subclavian stent system. Angioplasty was performed  using a 10 x 40 mm Conquest balloon. Following angioplasty, there is some reduction of flow secondary to residual thrombus. The balloon was removed. The Angiojet device was reinserted. Additional thrombectomy was performed through the Angiojet within the stent system. Final arteriography demonstrates brisk flow with minimal residual filling defect. There is a palpable thrill at the graft. IMPRESSION: 1. 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 2. Successful pharmacomechanical thrombolysis/thrombectomy procedure using a combination of tPA, Angiojet and the Arrow percutaneous thrombectomy device. 3. Successful angioplasty of in stent stenosis within the left innominate stent system using a 10 x 40 mm Conquest balloon. ACCESS: This access remains amenable to future percutaneous interventions as clinically indicated. Electronically Signed   By: Jacqulynn Cadet M.D.   On: 06/23/2017 17:06   Ir Pta Addl Central Dialysis Seg Thru Dialy Circuit Left  Result Date: 06/23/2017 INDICATION: 84 year old female with end-stage renal disease on hemodialysis via a left upper extremity arteriovenous graft. Her access has clotted, she has not dialyzed since Monday. EXAM: 1. Ultrasound-guided antegrade access 2. Ultrasound-guided retrograde access 3. Central venogram 4.  Thrombolysis/thrombectomy declot procedure using tPA, the Angiojet and the Arrow percutaneous thrombectomy device 5. Angioplasty of in stent stenosis within the left innominate vein stent system. MEDICATIONS: 5000 units heparin ANESTHESIA/SEDATION: No sedation. FLUOROSCOPY TIME:  Fluoroscopy Time: 11 minutes 42 seconds (45 mGy). COMPLICATIONS: None immediate. Patient did experience some chest pain and shortness of breath during the procedure as well as complaining of buttock pain, possibly due to a sacral decubitus ulcer. She also notes that she has no one at home to stay with her. For these reasons, after the procedure we transferred her to the emergency room for further evaluation prior to discharge. PROCEDURE: Informed written consent was obtained from the patient after a thorough discussion of the procedural risks, benefits and alternatives. All questions were addressed. Maximal Sterile Barrier Technique was utilized including caps, mask, sterile gowns, sterile gloves, sterile drape, hand hygiene and skin antiseptic. A timeout was performed prior to the initiation of the procedure. The left upper arm graft was interrogated with ultrasound and found to be completely thrombosed. An image was obtained and stored for the medical record. Local anesthesia was attained by infiltration with 1% lidocaine. A small dermatotomy was made. Under real-time sonographic guidance, the graft was punctured with a 21 gauge micropuncture needle in antegrade fashion adjacent to the arterial anastomosis. Using standard technique, the initial micro needle was exchanged over a 0.018 micro wire for a transitional 4 [place] micro sheath. 2 mg of tPA was then instilled into the thrombus. A second retrograde access was identified farther up the arm. Local anesthesia was attained by infiltration with 1% lidocaine. A small dermatotomy was made. Under real-time sonographic guidance, the vessel was punctured with a 21 gauge micropuncture needle.  Using standard technique, the initial micro needle was exchanged over a 0.018 micro wire for a transitional 4 [place] micro sheath. Another 2 mg of tPA was instilled into the thrombus from the retrograde access. Attention was again turned to the antegrade access adjacent to the arterial anastomosis. A short Amplatz wire was advanced through the transitional micro sheath in the micro sheath was exchanged for a working 7 [place] vascular sheath. An angled catheter was then advanced over the wire and into the left subclavian vein. A central venogram was performed. There is a a combination of likely in stent stenosis and thrombus within the left innominate and subclavian venous stent  system resulting in severe limitation of flow. Nonocclusive thrombus extends from the peripheral margin of the stent system throughout the left subclavian and axillary veins. Pull-back left upper extremity venogram confirms the extent of the thrombus as well as thrombus within the graft. Due to the volume of thrombus, the decision was made to proceed with debulking of the thrombus using the Angiojet. The catheter was navigated through the nearly occluded central stent system using a Bentson wire. The Bentson wire was then exchanged for a Rosen wire which was placed in the superior vena cava. A 6 French 90 cm Angiojet was then advanced over the wire and the segments of vein from the innominate stent to the stent in the mid arm was thrombectomized. Multiple passes of the Angiojet was performed. Follow-up venography demonstrates marked reduction in clot burden. There is only some mild residual InStent stenosis within the stent system. No definite clot identified. The catheter was then removed. The percutaneous thrombectomy device was then advanced into the graft and the graft was thrombectomized using the device. Attention was then turned to the retrograde graft access. The micro sheath was exchanged over a short Amplatz wire for a working 6  [place] vascular sheath. The percutaneous thrombectomy device was then advanced into the brachial artery and used in basket fashion to pull the arterial plug back into the body of the graft where it was macerated. Aspiration was then performed. Both the antegrade and retrograde sheath demonstrate brisk bleeding consistent with restoration of flow. The angled catheter was then advanced over a Bentson wire into the brachial artery and a brachial arteriogram was performed. This confirms patency of the arterial anastomosis, the graft and the draining vein. There is persistent in stent stenosis versus peripheral adherent thrombus within the left subclavian stent system. Angioplasty was performed using a 10 x 40 mm Conquest balloon. Following angioplasty, there is some reduction of flow secondary to residual thrombus. The balloon was removed. The Angiojet device was reinserted. Additional thrombectomy was performed through the Angiojet within the stent system. Final arteriography demonstrates brisk flow with minimal residual filling defect. There is a palpable thrill at the graft. IMPRESSION: 1. 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 2. Successful pharmacomechanical thrombolysis/thrombectomy procedure using a combination of tPA, Angiojet and the Arrow percutaneous thrombectomy device. 3. Successful angioplasty of in stent stenosis within the left innominate stent system using a 10 x 40 mm Conquest balloon. ACCESS: This access remains amenable to future percutaneous interventions as clinically indicated. Electronically Signed   By: Jacqulynn Cadet M.D.   On: 06/23/2017 17:06     EKG: Independently reviewed.  Sinus rhythm, QTC 463, old left bundle blockage, anteroseptal infarction pattern, poor R-wave progression, LAD.   Assessment/Plan Principal Problem:   NSTEMI (non-ST elevated myocardial infarction) ([place]) Active Problems:   Breast  cancer of lower-outer quadrant of right female breast ([place])   ESRD (end stage renal disease) on dialysis ([place])   Hypertension   Dyslipidemia   Pressure ulcer   Protein-calorie malnutrition, severe   S/P AKA (above knee amputation) bilateral ([facility])   Hypokalemia   NSTEMI (non-ST elevated myocardial infarction) ([place]): trop 2.20. Currently CP free. Card, Dr.  Lions was consulted.  - will admit to SDU as inpt - start IV hepain - start ASA 324 mg daily (need to monitor closely since pt had high risk bleeding event in the past) - cycle CE q6 x3 and repeat EKG in the am  - prn  Nitroglycerin, Morphine, and aspirin - start lipitor  80 mg daily - Risk factor stratification: will check FLP and A1C  - 2d echo - f/u card Recommendations.  ESRD (end stage renal disease) on dialysis ([facility]): pt states that she did not have HD due to AVF clot.  Potassium is 3.4, creatinine 3.20, BUN 27. -renal box is not working anymore -please call renal in the AM  Hypokalemia: K=3.4 on admission. - will f/u by BMP without repletion since pt is HD pt.   HTN:  -Continue home medications: metoprolol -IV hydralazine prn  Dyslipidemia: -started lipitor as above -f/u FLP  Pressure ulcer-sacral ulcer stage II -wound care consult  Protein-calorie malnutrition, severe: -continue nutrition supplement  Breast cancer of lower-outer quadrant of right female breast ([place]): -continue anastrozole  DVT ppx: IV Heparin         Code Status: Full code Family Communication: None at bed side.    Disposition Plan:  Anticipate discharge back to previous home environment Consults called:  Card, Dr. [place] Lions Admission status:  SDU/inpation       Date of Service 06/23/2017    Ivor Costa [facility] Pager [phone]  If 7PM-7AM, please contact night-coverage [email or web address] Password [facility] 06/23/2017, 11:07 PM

## 2017-06-23 NOTE — ED Notes (Signed)
Per IR, pt was found in chair with CBG of 58. IR reports last CBG of 67.

## 2017-06-23 NOTE — ED Triage Notes (Signed)
Pt arrived from IR with c/o CP and SOB after having her left upper arm graft declotted. Procedure was reported to be successful with thrombi pad placed over the site at 1645. Pt has wheelchair in room. From home, is bilateral AKA. Pt A&O upon arrival.

## 2017-06-23 NOTE — H&P (Signed)
Chief Complaint: Patient was seen in consultation today for clotted AV graft  Referring Physician(s): Hyde B  Supervising Physician: Jacqulynn Cadet  Patient Status: [facility] - Out-pt  History of Present Illness: [name] is a 84 y.o. female with past medical history of DM, Hep C, HTN, PVD s/p bilateral AKAs, and ESRD on out-patient dialysis who has a left arm AV graft.  She reportedly presented to dialysis center for regular dialysis yesterday however her graft was unable to be accessed.  IR consulted for declotting procedure.   Patient presented to [facility] short stay today via SCAT. She was found in the lobby by Short Stay RN to be groggy but still alert and oriented. Her CBG was 58.    Upon assessment, patient is able to correct give her name, birthday, and demonstrate understanding of plans for today.  She states her HCPOA is currently at work and will not be available until later this evening.  She plans to return home via SCAT.   Past Medical History:  Diagnosis Date  . Anemia   . Arthritis   . Bilateral breast cancer ([place])   . DM (diabetes mellitus) ([place])   . Dyslipidemia   . ESRD (end stage renal disease) on dialysis ([place])    a. [place]: MWF (07/10/2014)  . Hepatitis C   . Hyperparathyroidism   . Hypertension   . PVD (peripheral vascular disease) ([place])    a. s/p peripheral angiogram on 07/10/14 with no PCI and cont med Rx  . Vascular disease     Past Surgical History:  Procedure Laterality Date  . ABDOMINAL HYSTERECTOMY    . AMPUTATION Bilateral 11/21/2015   Procedure: AMPUTATION ABOVE KNEE;  Surgeon: Newt Minion, MD;  Location: [place];  Service: Orthopedics;  Laterality: Bilateral;  . APPENDECTOMY    . ARTERIOVENOUS GRAFT PLACEMENT Left 03/20/09   "had right arm previously but couldn't use it anymore"  . BACK SURGERY    . BREAST BIOPSY Bilateral   . BREAST BIOPSY Right 08/08/2013   Procedure: Evacuation Hematoma Right Chest;  Surgeon: [name], MD;  Location: [place];  Service: General;  Laterality: Right;  . CATARACT EXTRACTION Right   . DG AV DIALYSIS GRAFT DECLOT OR Left 01/27/11, 02/15/11, 03/15/11, 10/13/11   lua  . EVACUATION BREAST HEMATOMA  2001; 2015   left; right  . INSERTION OF DIALYSIS CATHETER Right 11/15/2015   Procedure: INSERTION OF Right Femoral  DIALYSIS CATHETER.;  Surgeon: Rosetta Posner, MD;  Location: [place];  Service: Vascular;  Laterality: Right;  . MASTECTOMY Left 1970's?  Marland Kitchen MASTECTOMY COMPLETE / SIMPLE Right 08/07/2013  . MEDIAN NERVE REPAIR Left    "decompression"  . PERIPHERAL VASCULAR CATHETERIZATION N/A 07/10/2014   Procedure: Abdominal Aortogram;  Surgeon: Wellington Hampshire, MD;  Location: [place] INVASIVE CV LAB CUPID;  Service: Cardiovascular;  Laterality: N/A;  . POSTERIOR FUSION CERVICAL SPINE     "have 6 screws"  . THROMBECTOMY AND REVISION OF ARTERIOVENTOUS (AV) GORETEX  GRAFT Left 04/18/2009  . THROMBECTOMY AND REVISION OF ARTERIOVENTOUS (AV) GORETEX  GRAFT Left 11/15/2015   Procedure: Thrombectomy and Revision of Left ARm AV Gortex Graft.;  Surgeon: Rosetta Posner, MD;  Location: [place];  Service: Vascular;  Laterality: Left;  . TOE AMPUTATION Right    1,2nd toes  . TONSILLECTOMY    . TOTAL MASTECTOMY Right 08/07/2013   Procedure: RIGHT TOTAL MASTECTOMY;  Surgeon: [name], MD;  Location: [place];  Service: General;  Laterality: Right;  . UTERINE FIBROID SURGERY      Allergies: Aspirin  Medications: Prior to Admission medications   Medication Sig Start Date End Date Taking? Authorizing Provider  anastrozole (ARIMIDEX) 1 MG tablet TAKE 1 TABLET BY MOUTH DAILY 06/01/17  Yes Shadad, Mathis Dad, MD  B Complex-C-Folic Acid (DIALYVITE TABLET) TABS Take 1 tablet by mouth daily after supper. 10/31/15  Yes [provider]  cinacalcet (SENSIPAR) 30 MG tablet Take 2 tablets (60 mg total) by mouth every Monday, Wednesday, and Friday with hemodialysis. Patient taking differently: Take 30 mg by mouth daily.   08/27/16  Yes Theodis Blaze, MD  collagenase (SANTYL) ointment Apply topically daily. Patient taking differently: Apply 1 application topically daily.  08/26/16  Yes Theodis Blaze, MD  ENSURE (ENSURE) Take 237 mLs by mouth 2 (two) times daily between meals.   Yes [provider]  lanthanum (FOSRENOL) 1000 MG chewable tablet Chew 1,000 mg by mouth daily after supper.    Yes [provider]  lidocaine (LIDODERM) 5 % Place 1 patch onto the skin daily. Remove & Discard patch within 12 hours or as directed by MD 05/30/17  Yes Leaphart, Zack Seal, PA-C  metoprolol succinate (TOPROL XL) 25 MG 24 hr tablet Take 0.5 tablets (12.5 mg total) by mouth daily. Patient taking differently: Take 25 mg by mouth at bedtime.  08/27/16  Yes Theodis Blaze, MD  oxyCODONE-acetaminophen (PERCOCET) 5-325 MG tablet Take 1 tablet by mouth every 6 (six) hours as needed for severe pain. 05/30/17  Yes Little, Wenda Overland, MD  Amino Acids-Protein Hydrolys (FEEDING SUPPLEMENT, PRO-STAT SUGAR FREE 64,) LIQD Take 30 mLs by mouth 2 (two) times daily. Patient not taking: Reported on 03/21/2017 08/26/16   Theodis Blaze, MD  calcitRIOL (ROCALTROL) 0.25 MCG capsule Take 3 capsules (0.75 mcg total) by mouth every Monday, Wednesday, and Friday with hemodialysis. Patient not taking: Reported on 06/23/2017 08/27/16   Theodis Blaze, MD  Nutritional Supplements (FEEDING SUPPLEMENT, NEPRO CARB STEADY,) LIQD Take 237 mLs by mouth 2 (two) times daily between meals. Patient not taking: Reported on 03/21/2017 11/24/15   Elgergawy, Silver Huguenin, MD  oxyCODONE (OXY IR/ROXICODONE) 5 MG immediate release tablet Take 1 tablet (5 mg total) by mouth every 4 (four) hours as needed for moderate pain. Patient not taking: Reported on 03/21/2017 08/26/16   Theodis Blaze, MD     Family History  Problem Relation Age of Onset  . Cancer Mother   . Hypertension Mother     Social History   Socioeconomic History  . Marital status: Widowed     Spouse name: Not on file  . Number of children: Not on file  . Years of education: Not on file  . Highest education level: Not on file  Occupational History  . Not on file  Social Needs  . Financial resource strain: Not on file  . Food insecurity:    Worry: Not on file    Inability: Not on file  . Transportation needs:    Medical: Not on file    Non-medical: Not on file  Tobacco Use  . Smoking status: Former Smoker    Types: Cigarettes  . Smokeless tobacco: Never Used  . Tobacco comment: "stopped smoking in 1969"  Substance and Sexual Activity  . Alcohol use: Not Currently    Alcohol/week: 0.0 oz    Frequency: Never    Comment: "no alcohol since 1969"  . Drug use: No  .  Sexual activity: Never  Lifestyle  . Physical activity:    Days per week: Not on file    Minutes per session: Not on file  . Stress: Not on file  Relationships  . Social connections:    Talks on phone: Not on file    Gets together: Not on file    Attends religious service: Not on file    Active member of club or organization: Not on file    Attends meetings of clubs or organizations: Not on file    Relationship status: Not on file  Other Topics Concern  . Not on file  Social History Narrative  . Not on file     Review of Systems: A 12 point ROS discussed and pertinent positives are indicated in the HPI above.  All other systems are negative.  Review of Systems  Constitutional: Negative for fatigue and fever.  Respiratory: Negative for cough and shortness of breath.   Cardiovascular: Negative for chest pain.  Gastrointestinal: Negative for abdominal pain.  Musculoskeletal: Negative for back pain.  Psychiatric/Behavioral: Negative for behavioral problems and confusion.    Vital Signs: BP (!) 100/30   Pulse 65   Temp 98.9 F (37.2 C) (Oral)   Resp 16   SpO2 100%   Physical Exam  Constitutional: She is oriented to person, place, and time. She appears well-developed.  Cardiovascular:  Normal rate, regular rhythm and normal heart sounds.  Pulmonary/Chest: Effort normal and breath sounds normal. No respiratory distress.  Abdominal: Soft.  Musculoskeletal:  No thrill to L arm AV graft.  Neurological: She is alert and oriented to person, place, and time.  Skin: Skin is warm and dry.  Psychiatric: She has a normal mood and affect. Her behavior is normal. Judgment and thought content normal.  Nursing note and vitals reviewed.      Imaging: Dg Cervical Spine Complete  Result Date: 05/30/2017 CLINICAL DATA:  84 y/o F; fall 1 month ago with pain on the right side. Stiffness on the left side. EXAM: CERVICAL SPINE - COMPLETE 4+ VIEW COMPARISON:  None. FINDINGS: Oblique and frontal radiographs of the cervical spine. Left subclavian vascular stent noted. Anterior fusion hardware of upper cervical spine appears intact. No acute fracture or malalignment identified given limited views. Dense calcific atherosclerosis and carotid bifurcations. IMPRESSION: No acute fracture or malalignment identified on limited views. Anterior cervical fusion hardware is intact. If clinically indicated consider CT of the cervical spine for better characterization. Electronically Signed   By: Kristine Garbe M.D.   On: 05/30/2017 17:16   Ct Thoracic Spine Wo Contrast  Result Date: 05/30/2017 CLINICAL DATA:  84 y/o F; mechanical fall 1 month ago with persistent shoulder pain, lower back pain, buttocks pain. EXAM: CT THORACIC SPINE WITHOUT CONTRAST TECHNIQUE: Multidetector CT images of the thoracic were obtained using the standard protocol without intravenous contrast. COMPARISON:  None. FINDINGS: Alignment: Minimal dextrocurvature.  Normal kyphosis. Vertebrae: No acute fracture or focal pathologic process. Paraspinal and other soft tissues: Atrophic kidneys with multiple small cysts. Large bilateral pleural effusions. Interstitial pulmonary edema. Patchy opacities in the left upper lobe. Aortic  atherosclerosis and severe coronary artery calcification. Disc levels: Multilevel discogenic degenerative changes with mild loss of intervertebral disc space height, disc calcifications, endplate marginal osteophytes. No high-grade bony canal or foraminal stenosis. IMPRESSION: 1. No acute fracture or dislocation of thoracic spine. 2. Mild thoracic spine spondylosis. 3. Large pleural effusions and interstitial pulmonary edema. Left upper lobe patchy opacities may represent alveolar edema or  superimposed pneumonia. 4. Severe coronary artery calcification.  Aortic atherosclerosis. Electronically Signed   By: Kristine Garbe M.D.   On: 05/30/2017 17:08   Ct Lumbar Spine Wo Contrast  Result Date: 05/30/2017 CLINICAL DATA:  84 y/o F; fall 1 month ago with lower back and buttocks pain. EXAM: CT LUMBAR SPINE WITHOUT CONTRAST TECHNIQUE: Multidetector CT imaging of the lumbar spine was performed without intravenous contrast administration. Multiplanar CT image reconstructions were also generated. COMPARISON:  None. FINDINGS: Segmentation: 5 lumbar type vertebrae. Alignment: Grade 1 L4-5 anterolisthesis. Vertebrae: Bones are demineralized. No acute fracture or dislocation. Right S1 segment well-circumscribed sclerotic focus, likely bone island. Paraspinal and other soft tissues: Renal atrophy with multiple cysts. Severe calcific atherosclerosis of the aorta and its branch vessels. Dilatation of the distal sigmoid and rectum. Disc levels: Multilevel disc and facet degenerative changes greatest at the L4-5 level at L5-S1 level where disc bulges, endplate marginal osteophytes, and facet hypertrophy result in mild-to-moderate foraminal stenosis and there is moderate to severe L4-5 canal stenosis. IMPRESSION: 1. No acute fracture identified. 2. Grade 1 L4-5 anterolisthesis. 3. Lumbar spondylosis greatest at L4-5 level where there is mild-to-moderate foraminal stenosis and moderate to severe canal stenosis. 4. Aortic  atherosclerosis. 5. Distal sigmoid colon and rectum distention may represent constipation. Electronically Signed   By: Kristine Garbe M.D.   On: 05/30/2017 17:13   Dg Hips Bilat W Or Wo Pelvis 3-4 Views  Result Date: 05/30/2017 CLINICAL DATA:  [place] a month ago, RIGHT-side pain, LEFT side stiffness EXAM: DG HIP (WITH OR WITHOUT PELVIS) 3-4V BILAT COMPARISON:  Abdominal radiograph 11/15/2015 FINDINGS: Severe osseous demineralization. Narrowing of the hip joints bilaterally. Foreshortening of the LEFT femoral neck suspicious for an impacted subcapital fracture. No additional fracture, dislocation or bone destruction. Severe diffuse atherosclerotic calcification of large and small vessels in pelvis. IMPRESSION: Severe osseous demineralization with suspected impacted subcapital fracture of the LEFT femoral neck. Electronically Signed   By: Lavonia Dana M.D.   On: 05/30/2017 17:15    Labs:  CBC: Recent Labs    08/25/16 0723 08/26/16 0445 03/21/17 1325 04/25/17 1557  WBC 5.1 5.7 3.1* 3.0*  HGB 9.1* 9.2* 13.7 12.2  HCT 27.4* 28.0* 41.8 36.0  PLT 212 165 67* 114*    COAGS: No results for input(s): INR, APTT in the last 8760 hours.  BMP: Recent Labs    08/25/16 0723 08/26/16 0445 03/21/17 1325 04/25/17 1557  NA 128* 132* 136 138  K 4.8 3.9 3.7 3.7  CL 98* 97* 100* 102  CO2 25 28 25 25   GLUCOSE 64* 85 57* 61*  BUN 19 14 7 9   CALCIUM 9.3 8.8* 8.7* 8.5*  CREATININE 2.97* 2.02* 1.80* 1.53*  GFRNONAA 14* 22* 25* 31*  GFRAA 16* 25* 29* 35*    LIVER FUNCTION TESTS: Recent Labs    08/21/16 1052 08/23/16 1511 08/24/16 0607 08/25/16 0723 08/26/16 0445  BILITOT 0.4  --   --   --   --   AST 28  --   --   --   --   ALT 15  --   --   --   --   ALKPHOS 72  --   --   --   --   PROT 6.1*  --   --   --   --   ALBUMIN 2.2* 1.8* 1.8* 1.8* 1.8*    TUMOR MARKERS: No results for input(s): AFPTM, CEA, CA199, CHROMGRNA in the last 8760 hours.  Assessment and  Plan: Patient with  past medical history of ESRD presents with complaint of clotted AV access.  IR consulted for declotting procedure at the request of Dr. Joelyn Oms. Patient presented to short stay today groggy with a blood sugar of 58.  She states she does not take insulin and her blood glucose improved to 98 after administration of D50.  Her hypoglycemia is likely due to NPO status today for procedure as she does not take insuliln. Patient was mentating appropriately throughout her time in short stay and was able to demonstrate understanding and ability to consent for herself. Patient does not have a ride home today.  She states her HCPOA is at work and will not be available until later today.  Her HCPOA was called by RN several times with no answer.  Informed patient that she would not be able to receive sedation medication today but that a plan to move forward in obtaining access for her to receive dialysis would be made. Discussed multiple options with patient including proceeding with declotting procedure without sedation.  If unsuccessful, patient may require temporary catheter placement as she cannot receive sedation for a tunneled catheter.  Patient was informed that placement of a temporary catheter may require her to be admitted.  Discussed with Dr. Laurence Ferrari who agrees to proceed with declotting procedure today.  Will obtain iSTAT.  She has been NPO and is not currently on blood thinners.   Risks and benefits discussed with the patient including, but not limited to bleeding, infection, vascular injury, pulmonary embolism, need for temporary HD catheter placement or even death.  All of the patient's questions were answered, patient is agreeable to proceed. Consent signed and in chart.  Thank you for this interesting consult.  I greatly enjoyed meeting [name] and look forward to participating in their care.  A copy of this report was sent to the requesting provider on this date.  Electronically  Signed: Docia Barrier, PA 06/23/2017, 3:31 PM   I spent a total of  30 Minutes   in face to face in clinical consultation, greater than 50% of which was counseling/coordinating care for clotted AV graft.

## 2017-06-23 NOTE — Progress Notes (Signed)
Pt bought from admitting in own wheelchair, lethargic, c/o pain in buttocks. Bought back to room P12 CBG check 58.  Attempted PIV in right posterior hand, IV team notified.  Glucose tablet given.  Will continue to monitor

## 2017-06-23 NOTE — ED Provider Notes (Signed)
[facility] EMERGENCY DEPARTMENT Provider Note   CSN: [id number] Arrival date & time: 06/23/17  1708     History   Chief Complaint Chief Complaint  Patient presents with  . Chest Pain    HPI [name] is a 84 y.o. female.  HPI   84 year old female sent from interventional radiology for further evaluation of chest pain.  She was there today to have thrombectomy of left upper extremity dialysis graft.  She says during the procedure she was having pain in her chest and she couldn't breath. "every time I looked up they were sticking needles in me." she says the pain and dyspnea resolved at the completion of the procedure. Currently only complains that "my rump roast is burning" referring to buttock pain. She reports this is chronic.  Past Medical History:  Diagnosis Date  . Anemia   . Arthritis   . Bilateral breast cancer ([place])   . DM (diabetes mellitus) ([place])   . Dyslipidemia   . ESRD (end stage renal disease) on dialysis ([place])    a. [place]: MWF (07/10/2014)  . Hepatitis C   . Hyperparathyroidism   . Hypertension   . PVD (peripheral vascular disease) ([place])    a. s/p peripheral angiogram on 07/10/14 with no PCI and cont med Rx  . Vascular disease     Patient Active Problem List   Diagnosis Date Noted  . Pressure injury of skin 08/23/2016  . Cellulitis and abscess of trunk 08/21/2016  . S/P AKA (above knee amputation) bilateral ([place]) 04/22/2016  . Protein-calorie malnutrition, severe 11/20/2015  . Osteomyelitis ([place]) 11/17/2015  . Pressure ulcer 11/16/2015  . Absolute anemia 11/16/2015  . Acute post-hemorrhagic anemia 11/15/2015  . Bleeding from dialysis shunt ([place]) 11/15/2015  . Incontinence of feces 11/15/2015  . ESRD (end stage renal disease) on dialysis ([place])   . Hepatitis C   . Hypertension   . Dyslipidemia   . PVD (peripheral vascular disease) ([place]) 07/02/2014  . End stage renal disease ([place]) 08/08/2013  . Postoperative wound hematoma  08/08/2013  . Breast cancer of lower-outer quadrant of right female breast ([place]) 07/19/2013    Past Surgical History:  Procedure Laterality Date  . ABDOMINAL HYSTERECTOMY    . AMPUTATION Bilateral 11/21/2015   Procedure: AMPUTATION ABOVE KNEE;  Surgeon: Newt Minion, MD;  Location: [facility];  Service: Orthopedics;  Laterality: Bilateral;  . APPENDECTOMY    . ARTERIOVENOUS GRAFT PLACEMENT Left 03/20/09   "had right arm previously but couldn't use it anymore"  . BACK SURGERY    . BREAST BIOPSY Bilateral   . BREAST BIOPSY Right 08/08/2013   Procedure: Evacuation Hematoma Right Chest;  Surgeon: [name], MD;  Location: [facility];  Service: General;  Laterality: Right;  . CATARACT EXTRACTION Right   . DG AV DIALYSIS GRAFT DECLOT OR Left 01/27/11, 02/15/11, 03/15/11, 10/13/11   lua  . EVACUATION BREAST HEMATOMA  2001; 2015   left; right  . INSERTION OF DIALYSIS CATHETER Right 11/15/2015   Procedure: INSERTION OF Right Femoral  DIALYSIS CATHETER.;  Surgeon: Rosetta Posner, MD;  Location[facility] OR;  Service: Vascular;  Laterality: Right;  . IR PTA ADDL CENTRAL DIALYSIS SEG THRU DIALY CIRCUIT LEFT Left 06/23/2017  . IR THROMBECTOMY AV FISTULA W/THROMBOLYSIS INC/SHUNT/IMG LEFT Left 06/23/2017  . IR US GUIDE VASC ACCESS LEFT  06/23/2017  . MASTECTOMY Left 1970's?  Marland Kitchen MASTECTOMY COMPLETE / SIMPLE Right 08/07/2013  . MEDIAN NERVE REPAIR Left    "  decompression"  . PERIPHERAL VASCULAR CATHETERIZATION N/A 07/10/2014   Procedure: Abdominal Aortogram;  Surgeon: Wellington Hampshire, MD;  Location: [facility] INVASIVE CV LAB CUPID;  Service: Cardiovascular;  Laterality: N/A;  . POSTERIOR FUSION CERVICAL SPINE     "have 6 screws"  . THROMBECTOMY AND REVISION OF ARTERIOVENTOUS (AV) GORETEX  GRAFT Left 04/18/2009  . THROMBECTOMY AND REVISION OF ARTERIOVENTOUS (AV) GORETEX  GRAFT Left 11/15/2015   Procedure: Thrombectomy and Revision of Left ARm AV Gortex Graft.;  Surgeon: Rosetta Posner, MD;  Location: [facility];  Service: Vascular;   Laterality: Left;  . TOE AMPUTATION Right    1,2nd toes  . TONSILLECTOMY    . TOTAL MASTECTOMY Right 08/07/2013   Procedure: RIGHT TOTAL MASTECTOMY;  Surgeon: [name], MD;  Location: [facility];  Service: General;  Laterality: Right;  . UTERINE FIBROID SURGERY       OB History   None      Home Medications    Prior to Admission medications   Medication Sig Start Date End Date Taking? Authorizing Provider  Amino Acids-Protein Hydrolys (FEEDING SUPPLEMENT, PRO-STAT SUGAR FREE 64,) LIQD Take 30 mLs by mouth 2 (two) times daily. Patient not taking: Reported on 03/21/2017 08/26/16   Theodis Blaze, MD  anastrozole (ARIMIDEX) 1 MG tablet TAKE 1 TABLET BY MOUTH DAILY 06/01/17   Wyatt Portela, MD  B Complex-C-Folic Acid (DIALYVITE TABLET) TABS Take 1 tablet by mouth daily after supper. 10/31/15   [provider]  calcitRIOL (ROCALTROL) 0.25 MCG capsule Take 3 capsules (0.75 mcg total) by mouth every Monday, Wednesday, and Friday with hemodialysis. Patient not taking: Reported on 06/23/2017 08/27/16   Theodis Blaze, MD  cinacalcet [facility]) 30 MG tablet Take 2 tablets (60 mg total) by mouth every Monday, Wednesday, and Friday with hemodialysis. Patient taking differently: Take 30 mg by mouth daily.  08/27/16   Theodis Blaze, MD  collagenase (SANTYL) ointment Apply topically daily. Patient taking differently: Apply 1 application topically daily.  08/26/16   Theodis Blaze, MD  ENSURE (ENSURE) Take 237 mLs by mouth 2 (two) times daily between meals.    [provider]  lanthanum (FOSRENOL) 1000 MG chewable tablet Chew 1,000 mg by mouth daily after supper.     [provider]  lidocaine (LIDODERM) 5 % Place 1 patch onto the skin daily. Remove & Discard patch within 12 hours or as directed by MD 05/30/17   Ocie Cornfield T, PA-C  metoprolol succinate (TOPROL XL) 25 MG 24 hr tablet Take 0.5 tablets (12.5 mg total) by mouth daily. Patient taking differently: Take 25 mg by  mouth at bedtime.  08/27/16   Theodis Blaze, MD  Nutritional Supplements (FEEDING SUPPLEMENT, NEPRO CARB STEADY,) LIQD Take 237 mLs by mouth 2 (two) times daily between meals. Patient not taking: Reported on 03/21/2017 11/24/15   Elgergawy, Silver Huguenin, MD  oxyCODONE (OXY IR/ROXICODONE) 5 MG immediate release tablet Take 1 tablet (5 mg total) by mouth every 4 (four) hours as needed for moderate pain. Patient not taking: Reported on 03/21/2017 08/26/16   Theodis Blaze, MD  oxyCODONE-acetaminophen (PERCOCET) 5-325 MG tablet Take 1 tablet by mouth every 6 (six) hours as needed for severe pain. 05/30/17   Little, Wenda Overland, MD    Family History Family History  Problem Relation Age of Onset  . Cancer Mother   . Hypertension Mother     Social History Social History   Tobacco Use  . Smoking status:  Former Smoker    Types: Cigarettes  . Smokeless tobacco: Never Used  . Tobacco comment: "stopped smoking in 1969"  Substance Use Topics  . Alcohol use: Not Currently    Alcohol/week: 0.0 oz    Frequency: Never    Comment: "no alcohol since 1969"  . Drug use: No     Allergies   Aspirin   Review of Systems Review of Systems  All systems reviewed and negative, other than as noted in HPI.  Physical Exam Updated Vital Signs BP 100/83 (BP Location: Right Arm)   Pulse 73   Temp 98.1 F (36.7 C) (Oral)   Resp 18   Ht 5\' 3"  (1.6 m)   Wt 53.5 kg (118 lb)   SpO2 99%   BMI 20.90 kg/m   Physical Exam  Constitutional: She appears well-developed and well-nourished. No distress.  HENT:  Head: Normocephalic and atraumatic.  Eyes: Conjunctivae are normal. Right eye exhibits no discharge. Left eye exhibits no discharge.  Neck: Neck supple.  Cardiovascular: Normal rate, regular rhythm and normal heart sounds. Exam reveals no gallop and no friction rub.  No murmur heard. I couldn't clearly identify a thrill in LUE graft but bandages in place post procedure.   Pulmonary/Chest: Effort normal  and breath sounds normal. No respiratory distress.  Abdominal: Soft. She exhibits no distension. There is no tenderness.  Musculoskeletal: She exhibits no edema or tenderness.  B/l aka  Neurological: She is alert.  Skin: Skin is warm and dry.  Psychiatric: She has a normal mood and affect. Her behavior is normal. Thought content normal.  Nursing note and vitals reviewed.    ED Treatments / Results  Labs (all labs ordered are listed, but only abnormal results are displayed) Labs Reviewed  TROPONIN I - Abnormal; Notable for the following components:      Result Value   Troponin I 2.20 (*)    All other components within normal limits    EKG None  Radiology Ir US Guide Vasc Access Left  Result Date: 06/23/2017 INDICATION: 84 year old female with end-stage renal disease on hemodialysis via a left upper extremity arteriovenous graft. Her access has clotted, she has not dialyzed since Monday. EXAM: 1. Ultrasound-guided antegrade access 2. Ultrasound-guided retrograde access 3. Central venogram 4. Thrombolysis/thrombectomy declot procedure using tPA, the Angiojet and the Arrow percutaneous thrombectomy device 5. Angioplasty of in stent stenosis within the left innominate vein stent system. MEDICATIONS: 5000 units heparin ANESTHESIA/SEDATION: No sedation. FLUOROSCOPY TIME:  Fluoroscopy Time: 11 minutes 42 seconds (45 mGy). COMPLICATIONS: None immediate. Patient did experience some chest pain and shortness of breath during the procedure as well as complaining of buttock pain, possibly due to a sacral decubitus ulcer. She also notes that she has no one at home to stay with her. For these reasons, after the procedure we transferred her to the emergency room for further evaluation prior to discharge. PROCEDURE: Informed written consent was obtained from the patient after a thorough discussion of the procedural risks, benefits and alternatives. All questions were addressed. Maximal Sterile Barrier  Technique was utilized including caps, mask, sterile gowns, sterile gloves, sterile drape, hand hygiene and skin antiseptic. A timeout was performed prior to the initiation of the procedure. The left upper arm graft was interrogated with ultrasound and found to be completely thrombosed. An image was obtained and stored for the medical record. Local anesthesia was attained by infiltration with 1% lidocaine. A small dermatotomy was made. Under real-time sonographic guidance, the graft was punctured with  a 21 gauge micropuncture needle in antegrade fashion adjacent to the arterial anastomosis. Using standard technique, the initial micro needle was exchanged over a 0.018 micro wire for a transitional 4 [place] micro sheath. 2 mg of tPA was then instilled into the thrombus. A second retrograde access was identified farther up the arm. Local anesthesia was attained by infiltration with 1% lidocaine. A small dermatotomy was made. Under real-time sonographic guidance, the vessel was punctured with a 21 gauge micropuncture needle. Using standard technique, the initial micro needle was exchanged over a 0.018 micro wire for a transitional 4 [place] micro sheath. Another 2 mg of tPA was instilled into the thrombus from the retrograde access. Attention was again turned to the antegrade access adjacent to the arterial anastomosis. A short Amplatz wire was advanced through the transitional micro sheath in the micro sheath was exchanged for a working 7 [place] vascular sheath. An angled catheter was then advanced over the wire and into the left subclavian vein. A central venogram was performed. There is a a combination of likely in stent stenosis and thrombus within the left innominate and subclavian venous stent system resulting in severe limitation of flow. Nonocclusive thrombus extends from the peripheral margin of the stent system throughout the left subclavian and axillary veins. Pull-back left upper extremity venogram confirms  the extent of the thrombus as well as thrombus within the graft. Due to the volume of thrombus, the decision was made to proceed with debulking of the thrombus using the Angiojet. The catheter was navigated through the nearly occluded central stent system using a Bentson wire. The Bentson wire was then exchanged for a Rosen wire which was placed in the superior vena cava. A 6 French 90 cm Angiojet was then advanced over the wire and the segments of vein from the innominate stent to the stent in the mid arm was thrombectomized. Multiple passes of the Angiojet was performed. Follow-up venography demonstrates marked reduction in clot burden. There is only some mild residual InStent stenosis within the stent system. No definite clot identified. The catheter was then removed. The percutaneous thrombectomy device was then advanced into the graft and the graft was thrombectomized using the device. Attention was then turned to the retrograde graft access. The micro sheath was exchanged over a short Amplatz wire for a working 6 [place] vascular sheath. The percutaneous thrombectomy device was then advanced into the brachial artery and used in basket fashion to pull the arterial plug back into the body of the graft where it was macerated. Aspiration was then performed. Both the antegrade and retrograde sheath demonstrate brisk bleeding consistent with restoration of flow. The angled catheter was then advanced over a Bentson wire into the brachial artery and a brachial arteriogram was performed. This confirms patency of the arterial anastomosis, the graft and the draining vein. There is persistent in stent stenosis versus peripheral adherent thrombus within the left subclavian stent system. Angioplasty was performed using a 10 x 40 mm Conquest balloon. Following angioplasty, there is some reduction of flow secondary to residual thrombus. The balloon was removed. The Angiojet device was reinserted. Additional thrombectomy was  performed through the Angiojet within the stent system. Final arteriography demonstrates brisk flow with minimal residual filling defect. There is a palpable thrill at the graft. IMPRESSION: 1. 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 2. Successful pharmacomechanical thrombolysis/thrombectomy procedure using a combination of tPA, Angiojet and the Arrow percutaneous thrombectomy device. 3. Successful angioplasty of  in stent stenosis within the left innominate stent system using a 10 x 40 mm Conquest balloon. ACCESS: This access remains amenable to future percutaneous interventions as clinically indicated. Electronically Signed   By: Jacqulynn Cadet M.D.   On: 06/23/2017 17:06   Ir Thrombectomy Av Fistula W/thrombolysis Inc/shunt/img Left  Result Date: 06/23/2017 INDICATION: 84 year old female with end-stage renal disease on hemodialysis via a left upper extremity arteriovenous graft. Her access has clotted, she has not dialyzed since Monday. EXAM: 1. Ultrasound-guided antegrade access 2. Ultrasound-guided retrograde access 3. Central venogram 4. Thrombolysis/thrombectomy declot procedure using tPA, the Angiojet and the Arrow percutaneous thrombectomy device 5. Angioplasty of in stent stenosis within the left innominate vein stent system. MEDICATIONS: 5000 units heparin ANESTHESIA/SEDATION: No sedation. FLUOROSCOPY TIME:  Fluoroscopy Time: 11 minutes 42 seconds (45 mGy). COMPLICATIONS: None immediate. Patient did experience some chest pain and shortness of breath during the procedure as well as complaining of buttock pain, possibly due to a sacral decubitus ulcer. She also notes that she has no one at home to stay with her. For these reasons, after the procedure we transferred her to the emergency room for further evaluation prior to discharge. PROCEDURE: Informed written consent was obtained from the patient after a thorough discussion of  the procedural risks, benefits and alternatives. All questions were addressed. Maximal Sterile Barrier Technique was utilized including caps, mask, sterile gowns, sterile gloves, sterile drape, hand hygiene and skin antiseptic. A timeout was performed prior to the initiation of the procedure. The left upper arm graft was interrogated with ultrasound and found to be completely thrombosed. An image was obtained and stored for the medical record. Local anesthesia was attained by infiltration with 1% lidocaine. A small dermatotomy was made. Under real-time sonographic guidance, the graft was punctured with a 21 gauge micropuncture needle in antegrade fashion adjacent to the arterial anastomosis. Using standard technique, the initial micro needle was exchanged over a 0.018 micro wire for a transitional 4 [place] micro sheath. 2 mg of tPA was then instilled into the thrombus. A second retrograde access was identified farther up the arm. Local anesthesia was attained by infiltration with 1% lidocaine. A small dermatotomy was made. Under real-time sonographic guidance, the vessel was punctured with a 21 gauge micropuncture needle. Using standard technique, the initial micro needle was exchanged over a 0.018 micro wire for a transitional 4 [place] micro sheath. Another 2 mg of tPA was instilled into the thrombus from the retrograde access. Attention was again turned to the antegrade access adjacent to the arterial anastomosis. A short Amplatz wire was advanced through the transitional micro sheath in the micro sheath was exchanged for a working 7 [place] vascular sheath. An angled catheter was then advanced over the wire and into the left subclavian vein. A central venogram was performed. There is a a combination of likely in stent stenosis and thrombus within the left innominate and subclavian venous stent system resulting in severe limitation of flow. Nonocclusive thrombus extends from the peripheral margin of the stent  system throughout the left subclavian and axillary veins. Pull-back left upper extremity venogram confirms the extent of the thrombus as well as thrombus within the graft. Due to the volume of thrombus, the decision was made to proceed with debulking of the thrombus using the Angiojet. The catheter was navigated through the nearly occluded central stent system using a Bentson wire. The Bentson wire was then exchanged for a Rosen wire which was placed in the superior vena cava. A 6 [place]  90 cm Angiojet was then advanced over the wire and the segments of vein from the innominate stent to the stent in the mid arm was thrombectomized. Multiple passes of the Angiojet was performed. Follow-up venography demonstrates marked reduction in clot burden. There is only some mild residual InStent stenosis within the stent system. No definite clot identified. The catheter was then removed. The percutaneous thrombectomy device was then advanced into the graft and the graft was thrombectomized using the device. Attention was then turned to the retrograde graft access. The micro sheath was exchanged over a short Amplatz wire for a working 6 [place] vascular sheath. The percutaneous thrombectomy device was then advanced into the brachial artery and used in basket fashion to pull the arterial plug back into the body of the graft where it was macerated. Aspiration was then performed. Both the antegrade and retrograde sheath demonstrate brisk bleeding consistent with restoration of flow. The angled catheter was then advanced over a Bentson wire into the brachial artery and a brachial arteriogram was performed. This confirms patency of the arterial anastomosis, the graft and the draining vein. There is persistent in stent stenosis versus peripheral adherent thrombus within the left subclavian stent system. Angioplasty was performed using a 10 x 40 mm Conquest balloon. Following angioplasty, there is some reduction of flow secondary to  residual thrombus. The balloon was removed. The Angiojet device was reinserted. Additional thrombectomy was performed through the Angiojet within the stent system. Final arteriography demonstrates brisk flow with minimal residual filling defect. There is a palpable thrill at the graft. IMPRESSION: 1. 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 2. Successful pharmacomechanical thrombolysis/thrombectomy procedure using a combination of tPA, Angiojet and the Arrow percutaneous thrombectomy device. 3. Successful angioplasty of in stent stenosis within the left innominate stent system using a 10 x 40 mm Conquest balloon. ACCESS: This access remains amenable to future percutaneous interventions as clinically indicated. Electronically Signed   By: Jacqulynn Cadet M.D.   On: 06/23/2017 17:06   Ir Pta Addl Central Dialysis Seg Thru Dialy Circuit Left  Result Date: 06/23/2017 INDICATION: 84 year old female with end-stage renal disease on hemodialysis via a left upper extremity arteriovenous graft. Her access has clotted, she has not dialyzed since Monday. EXAM: 1. Ultrasound-guided antegrade access 2. Ultrasound-guided retrograde access 3. Central venogram 4. Thrombolysis/thrombectomy declot procedure using tPA, the Angiojet and the Arrow percutaneous thrombectomy device 5. Angioplasty of in stent stenosis within the left innominate vein stent system. MEDICATIONS: 5000 units heparin ANESTHESIA/SEDATION: No sedation. FLUOROSCOPY TIME:  Fluoroscopy Time: 11 minutes 42 seconds (45 mGy). COMPLICATIONS: None immediate. Patient did experience some chest pain and shortness of breath during the procedure as well as complaining of buttock pain, possibly due to a sacral decubitus ulcer. She also notes that she has no one at home to stay with her. For these reasons, after the procedure we transferred her to the emergency room for further evaluation prior to  discharge. PROCEDURE: Informed written consent was obtained from the patient after a thorough discussion of the procedural risks, benefits and alternatives. All questions were addressed. Maximal Sterile Barrier Technique was utilized including caps, mask, sterile gowns, sterile gloves, sterile drape, hand hygiene and skin antiseptic. A timeout was performed prior to the initiation of the procedure. The left upper arm graft was interrogated with ultrasound and found to be completely thrombosed. An image was obtained and stored for the medical record. Local anesthesia was attained by infiltration with 1%  lidocaine. A small dermatotomy was made. Under real-time sonographic guidance, the graft was punctured with a 21 gauge micropuncture needle in antegrade fashion adjacent to the arterial anastomosis. Using standard technique, the initial micro needle was exchanged over a 0.018 micro wire for a transitional 4 [place] micro sheath. 2 mg of tPA was then instilled into the thrombus. A second retrograde access was identified farther up the arm. Local anesthesia was attained by infiltration with 1% lidocaine. A small dermatotomy was made. Under real-time sonographic guidance, the vessel was punctured with a 21 gauge micropuncture needle. Using standard technique, the initial micro needle was exchanged over a 0.018 micro wire for a transitional 4 [place] micro sheath. Another 2 mg of tPA was instilled into the thrombus from the retrograde access. Attention was again turned to the antegrade access adjacent to the arterial anastomosis. A short Amplatz wire was advanced through the transitional micro sheath in the micro sheath was exchanged for a working 7 [place] vascular sheath. An angled catheter was then advanced over the wire and into the left subclavian vein. A central venogram was performed. There is a a combination of likely in stent stenosis and thrombus within the left innominate and subclavian venous stent system  resulting in severe limitation of flow. Nonocclusive thrombus extends from the peripheral margin of the stent system throughout the left subclavian and axillary veins. Pull-back left upper extremity venogram confirms the extent of the thrombus as well as thrombus within the graft. Due to the volume of thrombus, the decision was made to proceed with debulking of the thrombus using the Angiojet. The catheter was navigated through the nearly occluded central stent system using a Bentson wire. The Bentson wire was then exchanged for a Rosen wire which was placed in the superior vena cava. A 6 French 90 cm Angiojet was then advanced over the wire and the segments of vein from the innominate stent to the stent in the mid arm was thrombectomized. Multiple passes of the Angiojet was performed. Follow-up venography demonstrates marked reduction in clot burden. There is only some mild residual InStent stenosis within the stent system. No definite clot identified. The catheter was then removed. The percutaneous thrombectomy device was then advanced into the graft and the graft was thrombectomized using the device. Attention was then turned to the retrograde graft access. The micro sheath was exchanged over a short Amplatz wire for a working 6 [place] vascular sheath. The percutaneous thrombectomy device was then advanced into the brachial artery and used in basket fashion to pull the arterial plug back into the body of the graft where it was macerated. Aspiration was then performed. Both the antegrade and retrograde sheath demonstrate brisk bleeding consistent with restoration of flow. The angled catheter was then advanced over a Bentson wire into the brachial artery and a brachial arteriogram was performed. This confirms patency of the arterial anastomosis, the graft and the draining vein. There is persistent in stent stenosis versus peripheral adherent thrombus within the left subclavian stent system. Angioplasty was  performed using a 10 x 40 mm Conquest balloon. Following angioplasty, there is some reduction of flow secondary to residual thrombus. The balloon was removed. The Angiojet device was reinserted. Additional thrombectomy was performed through the Angiojet within the stent system. Final arteriography demonstrates brisk flow with minimal residual filling defect. There is a palpable thrill at the graft. IMPRESSION: 1. 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 2. Successful pharmacomechanical thrombolysis/thrombectomy procedure using  a combination of tPA, Angiojet and the Arrow percutaneous thrombectomy device. 3. Successful angioplasty of in stent stenosis within the left innominate stent system using a 10 x 40 mm Conquest balloon. ACCESS: This access remains amenable to future percutaneous interventions as clinically indicated. Electronically Signed   By: Jacqulynn Cadet M.D.   On: 06/23/2017 17:06    Procedures Procedures (including critical care time)  CRITICAL CARE Performed by: Virgel Manifold Total critical care time: 35 minutes Critical care time was exclusive of separately billable procedures and treating other patients. Critical care was necessary to treat or prevent imminent or life-threatening deterioration. Critical care was time spent personally by me on the following activities: development of treatment plan with patient and/or surrogate as well as nursing, discussions with consultants, evaluation of patient's response to treatment, examination of patient, obtaining history from patient or surrogate, ordering and performing treatments and interventions, ordering and review of laboratory studies, ordering and review of radiographic studies, pulse oximetry and re-evaluation of patient's condition.   Medications Ordered in ED Medications - No data to display   Initial Impression / Assessment and Plan / ED Course  I have  reviewed the triage vital signs and the nursing notes.  Pertinent labs & imaging results that were available during my care of the patient were reviewed by me and considered in my medical decision making (see chart for details).     84 year old female with chest pain and dyspnea during thrombectomy of her left upper extremity dialysis graft.  She is not a good historian, but pain resolved upon completion of the procedure.  EKG with chronic LBBB. Symptoms now resolved, but troponin >2. Cardiology consultation. Admit.   Final Clinical Impressions(s) / ED Diagnoses   Final diagnoses:  NSTEMI (non-ST elevated myocardial infarction) [facility])    ED Discharge Orders    None       Virgel Manifold, MD 06/25/17 1026

## 2017-06-24 ENCOUNTER — Encounter (HOSPITAL_COMMUNITY): Payer: Self-pay | Admitting: General Practice

## 2017-06-24 ENCOUNTER — Encounter (HOSPITAL_COMMUNITY)
Admission: EM | Disposition: A | Discharge status: Hospice | Payer: Self-pay | Source: Home / Self Care | Attending: Internal Medicine

## 2017-06-24 ENCOUNTER — Inpatient Hospital Stay (HOSPITAL_COMMUNITY): Payer: Medicare Other

## 2017-06-24 DIAGNOSIS — I214 Non-ST elevation (NSTEMI) myocardial infarction: Principal | ICD-10-CM

## 2017-06-24 DIAGNOSIS — J810 Acute pulmonary edema: Secondary | ICD-10-CM

## 2017-06-24 DIAGNOSIS — E8770 Fluid overload, unspecified: Secondary | ICD-10-CM

## 2017-06-24 DIAGNOSIS — R079 Chest pain, unspecified: Secondary | ICD-10-CM

## 2017-06-24 DIAGNOSIS — J9601 Acute respiratory failure with hypoxia: Secondary | ICD-10-CM

## 2017-06-24 DIAGNOSIS — L89159 Pressure ulcer of sacral region, unspecified stage: Secondary | ICD-10-CM

## 2017-06-24 LAB — ALBUMIN: Albumin: 3.1 g/dL — ABNORMAL LOW (ref 3.5–5.0)

## 2017-06-24 LAB — CBC WITH DIFFERENTIAL/PLATELET
BASOS ABS: 0 10*3/uL (ref 0.0–0.1)
Basophils Relative: 0 %
EOS ABS: 0 10*3/uL (ref 0.0–0.7)
EOS PCT: 0 %
HCT: 27.9 % — ABNORMAL LOW (ref 36.0–46.0)
Hemoglobin: 9.4 g/dL — ABNORMAL LOW (ref 12.0–15.0)
Lymphocytes Relative: 16 %
Lymphs Abs: 0.7 10*3/uL (ref 0.7–4.0)
MCH: 30.6 pg (ref 26.0–34.0)
MCHC: 33.7 g/dL (ref 30.0–36.0)
MCV: 90.9 fL (ref 78.0–100.0)
MONO ABS: 0.2 10*3/uL (ref 0.1–1.0)
MONOS PCT: 4 %
Neutro Abs: 3.7 10*3/uL (ref 1.7–7.7)
Neutrophils Relative %: 80 %
PLATELETS: 91 10*3/uL — AB (ref 150–400)
RBC: 3.07 MIL/uL — ABNORMAL LOW (ref 3.87–5.11)
RDW: 15 % (ref 11.5–15.5)
WBC: 4.6 10*3/uL (ref 4.0–10.5)

## 2017-06-24 LAB — ECHOCARDIOGRAM COMPLETE
Height: 63 in
WEIGHTICAEL: 699.2 [oz_av]

## 2017-06-24 LAB — POCT I-STAT, CHEM 8
BUN: 34 mg/dL — AB (ref 6–20)
CALCIUM ION: 0.54 mmol/L — AB (ref 1.15–1.40)
CREATININE: 2.5 mg/dL — AB (ref 0.44–1.00)
Chloride: 87 mmol/L — ABNORMAL LOW (ref 101–111)
Glucose, Bld: 38 mg/dL — CL (ref 65–99)
HCT: 26 % — ABNORMAL LOW (ref 36.0–46.0)
Hemoglobin: 8.8 g/dL — ABNORMAL LOW (ref 12.0–15.0)
Potassium: 3.6 mmol/L (ref 3.5–5.1)
Sodium: 101 mmol/L — CL (ref 135–145)
TCO2: 23 mmol/L (ref 22–32)

## 2017-06-24 LAB — LIPID PANEL
Cholesterol: 126 mg/dL (ref 0–200)
HDL: 47 mg/dL (ref >40)
LDL CALC: 72 mg/dL (ref 0–99)
TRIGLYCERIDES: 33 mg/dL (ref <150)
Total CHOL/HDL Ratio: 2.7 RATIO
VLDL: 7 mg/dL (ref 0–40)

## 2017-06-24 LAB — BASIC METABOLIC PANEL
ANION GAP: 11 (ref 5–15)
BUN: 38 mg/dL — ABNORMAL HIGH (ref 6–20)
CHLORIDE: 102 mmol/L (ref 101–111)
CO2: 24 mmol/L (ref 22–32)
Calcium: 9.4 mg/dL (ref 8.9–10.3)
Creatinine, Ser: 3.77 mg/dL — ABNORMAL HIGH (ref 0.44–1.00)
GFR calc non Af Amer: 10 mL/min — ABNORMAL LOW (ref >60)
GFR, EST AFRICAN AMERICAN: 12 mL/min — AB (ref >60)
GLUCOSE: 102 mg/dL — AB (ref 65–99)
Potassium: 3.5 mmol/L (ref 3.5–5.1)
Sodium: 137 mmol/L (ref 135–145)

## 2017-06-24 LAB — GLUCOSE, CAPILLARY
GLUCOSE-CAPILLARY: 29 mg/dL — AB (ref 65–99)
Glucose-Capillary: <10 mg/dL — CL (ref 65–99)
Glucose-Capillary: 39 mg/dL — CL (ref 65–99)
Glucose-Capillary: <10 mg/dL — CL (ref 65–99)

## 2017-06-24 LAB — HEMOGLOBIN A1C
HEMOGLOBIN A1C: 3.9 % — AB (ref 4.8–5.6)
Mean Plasma Glucose: 65.23 mg/dL

## 2017-06-24 LAB — MRSA PCR SCREENING: MRSA BY PCR: NEGATIVE

## 2017-06-24 LAB — TROPONIN I
TROPONIN I: 18.94 ng/mL — AB (ref <0.03)
Troponin I: 30.29 ng/mL (ref <0.03)

## 2017-06-24 LAB — HEPARIN LEVEL (UNFRACTIONATED): Heparin Unfractionated: 0.18 IU/mL — ABNORMAL LOW (ref 0.30–0.70)

## 2017-06-24 LAB — BRAIN NATRIURETIC PEPTIDE: B Natriuretic Peptide: >4500 pg/mL — ABNORMAL HIGH (ref 0.0–100.0)

## 2017-06-24 SURGERY — LEFT HEART CATH AND CORONARY ANGIOGRAPHY
Anesthesia: LOCAL

## 2017-06-24 MED ORDER — CALCITRIOL 0.5 MCG PO CAPS
ORAL_CAPSULE | ORAL | Status: AC
  Filled 2017-06-24: qty 3

## 2017-06-24 MED ORDER — SODIUM CHLORIDE 0.9% FLUSH: 3.0000 mL | INTRAVENOUS | Status: DC | PRN

## 2017-06-24 MED ORDER — LIDOCAINE-PRILOCAINE 2.5-2.5 % EX CREA: 1.0000 "application " | TOPICAL_CREAM | CUTANEOUS | Status: DC | PRN

## 2017-06-24 MED ORDER — LIDOCAINE HCL (PF) 1 % IJ SOLN: 5.0000 mL | INTRAMUSCULAR | Status: DC | PRN

## 2017-06-24 MED ORDER — PRO-STAT SUGAR FREE PO LIQD
30.0000 mL | Freq: Two times a day (BID) | ORAL | Status: DC
  Administered 2017-06-25 – 2017-06-26 (×3): 30 mL via ORAL
  Filled 2017-06-24 (×3): qty 30

## 2017-06-24 MED ORDER — COLLAGENASE 250 UNIT/GM EX OINT
TOPICAL_OINTMENT | Freq: Every day | CUTANEOUS | Status: DC
  Administered 2017-06-24 – 2017-06-26 (×3): via TOPICAL
  Filled 2017-06-24: qty 30

## 2017-06-24 MED ORDER — NEPRO/CARBSTEADY PO LIQD
237.0000 mL | Freq: Three times a day (TID) | ORAL | Status: DC
  Administered 2017-06-24 – 2017-06-26 (×5): 237 mL via ORAL

## 2017-06-24 MED ORDER — MIDODRINE HCL 5 MG PO TABS
ORAL_TABLET | ORAL | Status: AC
  Administered 2017-06-24: 5 mg
  Filled 2017-06-24: qty 1

## 2017-06-24 MED ORDER — HEPARIN SODIUM (PORCINE) 1000 UNIT/ML DIALYSIS: 1000.0000 [IU] | INTRAMUSCULAR | Status: DC | PRN

## 2017-06-24 MED ORDER — MIDODRINE HCL 5 MG PO TABS: 10.0000 mg | ORAL_TABLET | Freq: Every day | ORAL | Status: DC | PRN

## 2017-06-24 MED ORDER — SODIUM CHLORIDE 0.9% FLUSH
3.0000 mL | Freq: Two times a day (BID) | INTRAVENOUS | Status: DC
  Administered 2017-06-24 – 2017-06-25 (×3): 3 mL via INTRAVENOUS

## 2017-06-24 MED ORDER — JUVEN PO PACK
1.0000 | PACK | Freq: Two times a day (BID) | ORAL | Status: DC
  Administered 2017-06-25 – 2017-06-26 (×2): 1 via ORAL
  Filled 2017-06-24 (×5): qty 1

## 2017-06-24 MED ORDER — ASPIRIN 81 MG PO CHEW
81.0000 mg | CHEWABLE_TABLET | Freq: Every day | ORAL | Status: DC
  Filled 2017-06-24: qty 1

## 2017-06-24 MED ORDER — SODIUM CHLORIDE 0.9 % IV SOLN: INTRAVENOUS | Status: DC

## 2017-06-24 MED ORDER — SODIUM CHLORIDE 0.9 % IV SOLN: 100.0000 mL | INTRAVENOUS | Status: DC | PRN

## 2017-06-24 MED ORDER — HEPARIN SODIUM (PORCINE) 1000 UNIT/ML DIALYSIS: 20.0000 [IU]/kg | INTRAMUSCULAR | Status: DC | PRN

## 2017-06-24 MED ORDER — CALCITRIOL 0.5 MCG PO CAPS
1.5000 ug | ORAL_CAPSULE | ORAL | Status: DC
  Administered 2017-06-24: 1.5 ug via ORAL

## 2017-06-24 MED ORDER — PENTAFLUOROPROP-TETRAFLUOROETH EX AERO
1.0000 | INHALATION_SPRAY | CUTANEOUS | Status: DC | PRN
Start: 2017-06-24 — End: 2017-06-25

## 2017-06-24 MED ORDER — DEXTROSE 50 % IV SOLN
INTRAVENOUS | Status: AC
  Administered 2017-06-24: 50 mL
  Filled 2017-06-24: qty 50

## 2017-06-24 MED ORDER — SODIUM CHLORIDE 0.9 % IV SOLN: 250.0000 mL | INTRAVENOUS | Status: DC | PRN

## 2017-06-24 NOTE — Progress Notes (Addendum)
Date and time results received: 06/24/17 0936  (use smartphrase ".now" to insert current time)  Test: Troponin  Critical Value: 18.94  Name of Provider Notified: Wendie Simmer, PA  Orders Received? Or Actions Taken?: No. Patient is on schedule for cath today.

## 2017-06-24 NOTE — Progress Notes (Signed)
Pt admitted to the unit. Pt is stable, alert. Educated to call for any assistance. Bed in lowest position, call bell within reach- will continue to monitor.

## 2017-06-24 NOTE — Progress Notes (Signed)
Spoke with pharmacy concerning lab tech request to pause the heparin gtt for labs. Patient is a bil AKA and her other arm is restricted. Will pass on to day RN

## 2017-06-24 NOTE — Progress Notes (Signed)
[facility] for heparin Indication: chest pain/ACS  Heparin Dosing Weight: ~35kg  Labs: Recent Labs    06/23/17 1535 06/23/17 1544 06/23/17 1900 06/23/17 2256 06/24/17 0701 06/24/17 0940 06/24/17 1046  HGB 8.8* 11.6*  --   --   --   --  9.4*  HCT 26.0* 34.0*  --   --   --   --  27.9*  PLT  --   --   --   --   --   --  91*  HEPARINUNFRC  --   --   --   --   --  0.18*  --   CREATININE 2.50* 3.20*  --   --   --   --   --   TROPONINI  --   --  2.20* 2.32* 18.94*  --   --     Assessment: 32 yof with hx of bilateral AKA, ESRD on HD presenting with CP during thrombectomy of LUE HD graft. Pharmacy consulted to dose heparin for ACS. Not on anticoagulation PTA.  Patient is a very difficult stick, and an arterial blood draw was done this morning for her labs. Heparin level resulted low at 0.18units/mL. Hgb 9.4, platelets low at 91- no overt bleeding noted.  Her weight has also been updated as it was recorded incorrectly this morning (recorded in pounds instead of kilograms which resulted in dramatically lower weight).  Goal of Therapy:  Heparin level 0.3-0.7 units/ml Monitor platelets by anticoagulation protocol: Yes   Plan:  Increase heparin to 700 units/hr Heparin level in 8 hours Daily heparin level/CBC Follow length of treatment with heparin as cardiology is not planning for cath  Jenness Stemler D. Makynlee Kressin, PharmD, BCPS Clinical Pharmacist Clinical Phone for 06/24/2017 until 3:30pm: [phone] If after 3:30pm, please call main pharmacy at [phone] 06/24/2017 2:02 PM

## 2017-06-24 NOTE — Progress Notes (Signed)
Lab tech told day and night RN's that she was unable to get adequate blood for all the labs for this pt. She stated that after sticking the pt multiple times by multiple lab techs, the next step is to contact the MD to see what he would like to do. Day RN aware.

## 2017-06-24 NOTE — Progress Notes (Addendum)
Progress Note  Patient Name: [name] Date of Encounter: 06/24/2017  Primary Cardiologist: Dr. Fletcher Anon  Subjective   She reports feeling poorly. With some SOB and cough x24 hours. Denies current chest pain. Reports sacral pain at the site of her ulcer. Reports LE edema has been going on for a while.   Patient was repositioned prior to leaving the room.   Inpatient Medications    Scheduled Meds: . anastrozole  1 mg Oral Daily  . aspirin  324 mg Oral Daily  . atorvastatin  80 mg Oral q1800  . cinacalcet  30 mg Oral Q breakfast  . collagenase   Topical Daily  . feeding supplement (ENSURE ENLIVE)  237 mL Oral BID BM  . lanthanum  1,000 mg Oral QPC supper  . lidocaine  1 patch Transdermal Q24H  . metoprolol succinate  25 mg Oral QHS  . multivitamin  1 tablet Oral QPC supper   Continuous Infusions: . heparin 650 Units/hr (06/23/17 2246)   PRN Meds: acetaminophen, ALPRAZolam, dextrose, hydrALAZINE, morphine injection, nitroGLYCERIN, ondansetron (ZOFRAN) IV, oxyCODONE-acetaminophen, zolpidem   Vital Signs    Vitals:   06/24/17 0055 06/24/17 0140 06/24/17 0414 06/24/17 0739  BP:   (!) 132/50 (!) 110/43  Pulse:  78 71 (!) 47  Resp: (!) 26 (!) 28 14 (!) 21  Temp:   97.7 F (36.5 C) 98.7 F (37.1 C)  TempSrc:   Oral Oral  SpO2:  92% 97% 94%  Weight:      Height:        Intake/Output Summary (Last 24 hours) at 06/24/2017 0948 Last data filed at 06/24/2017 0500 Gross per 24 hour  Intake 27.52 ml  Output 0 ml  Net 27.52 ml   Filed Weights   06/23/17 1719 06/24/17 0043  Weight: 118 lb (53.5 kg) 43 lb 11.2 oz (19.8 kg)    Telemetry    Sinus rhythm with LBBB, occasional PVCs and one brief episode of NSVT (4 beats) - Personally Reviewed  ECG    Sinus rhythm with LBBB (chronic) - Personally Reviewed  Physical Exam   GEN: Thin, frail elderly female laying in bed mildly uncomfortable Neck: No JVD, no carotid bruits Cardiac: quiet hearts sounds with RRR, no  obvious murmurs, rubs, or gallops.  Respiratory: unable to assess posteriorly due to limited mobility; rhonchi noted anteriorly, no obvious wheezing or rales; decreased breath sounds at bases GI: NABS, Soft, nontender, non-distended  MS: 3+ pitting edema to b/l LE; b/l AKA  Neuro:  Nonfocal, moving all extremities spontaneously Psych: Normal affect   Labs    Chemistry Recent Labs  Lab 06/23/17 1535 06/23/17 1544  NA 101* 139  K 3.6 3.4*  CL 87* 98*  GLUCOSE 38* 67  BUN 34* 27*  CREATININE 2.50* 3.20*     Hematology Recent Labs  Lab 06/23/17 1535 06/23/17 1544  HGB 8.8* 11.6*  HCT 26.0* 34.0*    Cardiac Enzymes Recent Labs  Lab 06/23/17 1900 06/23/17 2256 06/24/17 0701  TROPONINI 2.20* 2.32* 18.94*   No results for input(s): TROPIPOC in the last 168 hours.   BNPNo results for input(s): BNP, PROBNP in the last 168 hours.   DDimer No results for input(s): DDIMER in the last 168 hours.   Radiology    Dg Chest 2 View  Result Date: 06/23/2017 CLINICAL DATA:  Chest pain and shortness of breath which developed during interventional declotting procedure. EXAM: CHEST - 2 VIEW COMPARISON:  Chest radiograph 11/15/2015. FINDINGS: The heart  is enlarged. Perihilar and lower lobe opacities are new from priors, along with small BILATERAL effusions. Etiology is uncertain. Pneumonia, pulmonary edema, or atelectasis are considerations. No osseous findings. Vascular stents are seen in the LEFT arm and LEFT central veins. LEFT axillary clips. IMPRESSION: Cardiomegaly with BILATERAL perihilar and lower lobe opacities, uncertain significance. No immediate preprocedure chest is available for correlation, but the findings have worsened since the prior chest radiograph of 2017. Consider CTA chest for further evaluation. Electronically Signed   By: Staci Righter M.D.   On: 06/23/2017 19:07   Ir US Guide Vasc Access Left  Result Date: 06/23/2017 INDICATION: 84 year old female with end-stage  renal disease on hemodialysis via a left upper extremity arteriovenous graft. Her access has clotted, she has not dialyzed since Monday. EXAM: 1. Ultrasound-guided antegrade access 2. Ultrasound-guided retrograde access 3. Central venogram 4. Thrombolysis/thrombectomy declot procedure using tPA, the Angiojet and the Arrow percutaneous thrombectomy device 5. Angioplasty of in stent stenosis within the left innominate vein stent system. MEDICATIONS: 5000 units heparin ANESTHESIA/SEDATION: No sedation. FLUOROSCOPY TIME:  Fluoroscopy Time: 11 minutes 42 seconds (45 mGy). COMPLICATIONS: None immediate. Patient did experience some chest pain and shortness of breath during the procedure as well as complaining of buttock pain, possibly due to a sacral decubitus ulcer. She also notes that she has no one at home to stay with her. For these reasons, after the procedure we transferred her to the emergency room for further evaluation prior to discharge. PROCEDURE: Informed written consent was obtained from the patient after a thorough discussion of the procedural risks, benefits and alternatives. All questions were addressed. Maximal Sterile Barrier Technique was utilized including caps, mask, sterile gowns, sterile gloves, sterile drape, hand hygiene and skin antiseptic. A timeout was performed prior to the initiation of the procedure. The left upper arm graft was interrogated with ultrasound and found to be completely thrombosed. An image was obtained and stored for the medical record. Local anesthesia was attained by infiltration with 1% lidocaine. A small dermatotomy was made. Under real-time sonographic guidance, the graft was punctured with a 21 gauge micropuncture needle in antegrade fashion adjacent to the arterial anastomosis. Using standard technique, the initial micro needle was exchanged over a 0.018 micro wire for a transitional 4 [place] micro sheath. 2 mg of tPA was then instilled into the thrombus. A second  retrograde access was identified farther up the arm. Local anesthesia was attained by infiltration with 1% lidocaine. A small dermatotomy was made. Under real-time sonographic guidance, the vessel was punctured with a 21 gauge micropuncture needle. Using standard technique, the initial micro needle was exchanged over a 0.018 micro wire for a transitional 4 [place] micro sheath. Another 2 mg of tPA was instilled into the thrombus from the retrograde access. Attention was again turned to the antegrade access adjacent to the arterial anastomosis. A short Amplatz wire was advanced through the transitional micro sheath in the micro sheath was exchanged for a working 7 [place] vascular sheath. An angled catheter was then advanced over the wire and into the left subclavian vein. A central venogram was performed. There is a a combination of likely in stent stenosis and thrombus within the left innominate and subclavian venous stent system resulting in severe limitation of flow. Nonocclusive thrombus extends from the peripheral margin of the stent system throughout the left subclavian and axillary veins. Pull-back left upper extremity venogram confirms the extent of the thrombus as well as thrombus within the graft. Due to the volume of  thrombus, the decision was made to proceed with debulking of the thrombus using the Angiojet. The catheter was navigated through the nearly occluded central stent system using a Bentson wire. The Bentson wire was then exchanged for a Rosen wire which was placed in the superior vena cava. A 6 French 90 cm Angiojet was then advanced over the wire and the segments of vein from the innominate stent to the stent in the mid arm was thrombectomized. Multiple passes of the Angiojet was performed. Follow-up venography demonstrates marked reduction in clot burden. There is only some mild residual InStent stenosis within the stent system. No definite clot identified. The catheter was then removed. The  percutaneous thrombectomy device was then advanced into the graft and the graft was thrombectomized using the device. Attention was then turned to the retrograde graft access. The micro sheath was exchanged over a short Amplatz wire for a working 6 [place] vascular sheath. The percutaneous thrombectomy device was then advanced into the brachial artery and used in basket fashion to pull the arterial plug back into the body of the graft where it was macerated. Aspiration was then performed. Both the antegrade and retrograde sheath demonstrate brisk bleeding consistent with restoration of flow. The angled catheter was then advanced over a Bentson wire into the brachial artery and a brachial arteriogram was performed. This confirms patency of the arterial anastomosis, the graft and the draining vein. There is persistent in stent stenosis versus peripheral adherent thrombus within the left subclavian stent system. Angioplasty was performed using a 10 x 40 mm Conquest balloon. Following angioplasty, there is some reduction of flow secondary to residual thrombus. The balloon was removed. The Angiojet device was reinserted. Additional thrombectomy was performed through the Angiojet within the stent system. Final arteriography demonstrates brisk flow with minimal residual filling defect. There is a palpable thrill at the graft. IMPRESSION: 1. 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 2. Successful pharmacomechanical thrombolysis/thrombectomy procedure using a combination of tPA, Angiojet and the Arrow percutaneous thrombectomy device. 3. Successful angioplasty of in stent stenosis within the left innominate stent system using a 10 x 40 mm Conquest balloon. ACCESS: This access remains amenable to future percutaneous interventions as clinically indicated. Electronically Signed   By: Jacqulynn Cadet M.D.   On: 06/23/2017 17:06   Ir Thrombectomy Av  Fistula W/thrombolysis Inc/shunt/img Left  Result Date: 06/23/2017 INDICATION: 84 year old female with end-stage renal disease on hemodialysis via a left upper extremity arteriovenous graft. Her access has clotted, she has not dialyzed since Monday. EXAM: 1. Ultrasound-guided antegrade access 2. Ultrasound-guided retrograde access 3. Central venogram 4. Thrombolysis/thrombectomy declot procedure using tPA, the Angiojet and the Arrow percutaneous thrombectomy device 5. Angioplasty of in stent stenosis within the left innominate vein stent system. MEDICATIONS: 5000 units heparin ANESTHESIA/SEDATION: No sedation. FLUOROSCOPY TIME:  Fluoroscopy Time: 11 minutes 42 seconds (45 mGy). COMPLICATIONS: None immediate. Patient did experience some chest pain and shortness of breath during the procedure as well as complaining of buttock pain, possibly due to a sacral decubitus ulcer. She also notes that she has no one at home to stay with her. For these reasons, after the procedure we transferred her to the emergency room for further evaluation prior to discharge. PROCEDURE: Informed written consent was obtained from the patient after a thorough discussion of the procedural risks, benefits and alternatives. All questions were addressed. Maximal Sterile Barrier Technique was utilized including caps, mask, sterile gowns, sterile gloves, sterile drape, hand  hygiene and skin antiseptic. A timeout was performed prior to the initiation of the procedure. The left upper arm graft was interrogated with ultrasound and found to be completely thrombosed. An image was obtained and stored for the medical record. Local anesthesia was attained by infiltration with 1% lidocaine. A small dermatotomy was made. Under real-time sonographic guidance, the graft was punctured with a 21 gauge micropuncture needle in antegrade fashion adjacent to the arterial anastomosis. Using standard technique, the initial micro needle was exchanged over a 0.018  micro wire for a transitional 4 [place] micro sheath. 2 mg of tPA was then instilled into the thrombus. A second retrograde access was identified farther up the arm. Local anesthesia was attained by infiltration with 1% lidocaine. A small dermatotomy was made. Under real-time sonographic guidance, the vessel was punctured with a 21 gauge micropuncture needle. Using standard technique, the initial micro needle was exchanged over a 0.018 micro wire for a transitional 4 [place] micro sheath. Another 2 mg of tPA was instilled into the thrombus from the retrograde access. Attention was again turned to the antegrade access adjacent to the arterial anastomosis. A short Amplatz wire was advanced through the transitional micro sheath in the micro sheath was exchanged for a working 7 [place] vascular sheath. An angled catheter was then advanced over the wire and into the left subclavian vein. A central venogram was performed. There is a a combination of likely in stent stenosis and thrombus within the left innominate and subclavian venous stent system resulting in severe limitation of flow. Nonocclusive thrombus extends from the peripheral margin of the stent system throughout the left subclavian and axillary veins. Pull-back left upper extremity venogram confirms the extent of the thrombus as well as thrombus within the graft. Due to the volume of thrombus, the decision was made to proceed with debulking of the thrombus using the Angiojet. The catheter was navigated through the nearly occluded central stent system using a Bentson wire. The Bentson wire was then exchanged for a Rosen wire which was placed in the superior vena cava. A 6 French 90 cm Angiojet was then advanced over the wire and the segments of vein from the innominate stent to the stent in the mid arm was thrombectomized. Multiple passes of the Angiojet was performed. Follow-up venography demonstrates marked reduction in clot burden. There is only some mild  residual InStent stenosis within the stent system. No definite clot identified. The catheter was then removed. The percutaneous thrombectomy device was then advanced into the graft and the graft was thrombectomized using the device. Attention was then turned to the retrograde graft access. The micro sheath was exchanged over a short Amplatz wire for a working 6 [place] vascular sheath. The percutaneous thrombectomy device was then advanced into the brachial artery and used in basket fashion to pull the arterial plug back into the body of the graft where it was macerated. Aspiration was then performed. Both the antegrade and retrograde sheath demonstrate brisk bleeding consistent with restoration of flow. The angled catheter was then advanced over a Bentson wire into the brachial artery and a brachial arteriogram was performed. This confirms patency of the arterial anastomosis, the graft and the draining vein. There is persistent in stent stenosis versus peripheral adherent thrombus within the left subclavian stent system. Angioplasty was performed using a 10 x 40 mm Conquest balloon. Following angioplasty, there is some reduction of flow secondary to residual thrombus. The balloon was removed. The Angiojet device was reinserted. Additional thrombectomy was performed  through the Angiojet within the stent system. Final arteriography demonstrates brisk flow with minimal residual filling defect. There is a palpable thrill at the graft. IMPRESSION: 1. 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 2. Successful pharmacomechanical thrombolysis/thrombectomy procedure using a combination of tPA, Angiojet and the Arrow percutaneous thrombectomy device. 3. Successful angioplasty of in stent stenosis within the left innominate stent system using a 10 x 40 mm Conquest balloon. ACCESS: This access remains amenable to future percutaneous interventions as clinically  indicated. Electronically Signed   By: Jacqulynn Cadet M.D.   On: 06/23/2017 17:06   Ir Pta Addl Central Dialysis Seg Thru Dialy Circuit Left  Result Date: 06/23/2017 INDICATION: 84 year old female with end-stage renal disease on hemodialysis via a left upper extremity arteriovenous graft. Her access has clotted, she has not dialyzed since Monday. EXAM: 1. Ultrasound-guided antegrade access 2. Ultrasound-guided retrograde access 3. Central venogram 4. Thrombolysis/thrombectomy declot procedure using tPA, the Angiojet and the Arrow percutaneous thrombectomy device 5. Angioplasty of in stent stenosis within the left innominate vein stent system. MEDICATIONS: 5000 units heparin ANESTHESIA/SEDATION: No sedation. FLUOROSCOPY TIME:  Fluoroscopy Time: 11 minutes 42 seconds (45 mGy). COMPLICATIONS: None immediate. Patient did experience some chest pain and shortness of breath during the procedure as well as complaining of buttock pain, possibly due to a sacral decubitus ulcer. She also notes that she has no one at home to stay with her. For these reasons, after the procedure we transferred her to the emergency room for further evaluation prior to discharge. PROCEDURE: Informed written consent was obtained from the patient after a thorough discussion of the procedural risks, benefits and alternatives. All questions were addressed. Maximal Sterile Barrier Technique was utilized including caps, mask, sterile gowns, sterile gloves, sterile drape, hand hygiene and skin antiseptic. A timeout was performed prior to the initiation of the procedure. The left upper arm graft was interrogated with ultrasound and found to be completely thrombosed. An image was obtained and stored for the medical record. Local anesthesia was attained by infiltration with 1% lidocaine. A small dermatotomy was made. Under real-time sonographic guidance, the graft was punctured with a 21 gauge micropuncture needle in antegrade fashion adjacent to the  arterial anastomosis. Using standard technique, the initial micro needle was exchanged over a 0.018 micro wire for a transitional 4 [place] micro sheath. 2 mg of tPA was then instilled into the thrombus. A second retrograde access was identified farther up the arm. Local anesthesia was attained by infiltration with 1% lidocaine. A small dermatotomy was made. Under real-time sonographic guidance, the vessel was punctured with a 21 gauge micropuncture needle. Using standard technique, the initial micro needle was exchanged over a 0.018 micro wire for a transitional 4 [place] micro sheath. Another 2 mg of tPA was instilled into the thrombus from the retrograde access. Attention was again turned to the antegrade access adjacent to the arterial anastomosis. A short Amplatz wire was advanced through the transitional micro sheath in the micro sheath was exchanged for a working 7 [place] vascular sheath. An angled catheter was then advanced over the wire and into the left subclavian vein. A central venogram was performed. There is a a combination of likely in stent stenosis and thrombus within the left innominate and subclavian venous stent system resulting in severe limitation of flow. Nonocclusive thrombus extends from the peripheral margin of the stent system throughout the left subclavian and axillary veins. Pull-back left upper extremity venogram confirms the extent  of the thrombus as well as thrombus within the graft. Due to the volume of thrombus, the decision was made to proceed with debulking of the thrombus using the Angiojet. The catheter was navigated through the nearly occluded central stent system using a Bentson wire. The Bentson wire was then exchanged for a Rosen wire which was placed in the superior vena cava. A 6 French 90 cm Angiojet was then advanced over the wire and the segments of vein from the innominate stent to the stent in the mid arm was thrombectomized. Multiple passes of the Angiojet was  performed. Follow-up venography demonstrates marked reduction in clot burden. There is only some mild residual InStent stenosis within the stent system. No definite clot identified. The catheter was then removed. The percutaneous thrombectomy device was then advanced into the graft and the graft was thrombectomized using the device. Attention was then turned to the retrograde graft access. The micro sheath was exchanged over a short Amplatz wire for a working 6 [place] vascular sheath. The percutaneous thrombectomy device was then advanced into the brachial artery and used in basket fashion to pull the arterial plug back into the body of the graft where it was macerated. Aspiration was then performed. Both the antegrade and retrograde sheath demonstrate brisk bleeding consistent with restoration of flow. The angled catheter was then advanced over a Bentson wire into the brachial artery and a brachial arteriogram was performed. This confirms patency of the arterial anastomosis, the graft and the draining vein. There is persistent in stent stenosis versus peripheral adherent thrombus within the left subclavian stent system. Angioplasty was performed using a 10 x 40 mm Conquest balloon. Following angioplasty, there is some reduction of flow secondary to residual thrombus. The balloon was removed. The Angiojet device was reinserted. Additional thrombectomy was performed through the Angiojet within the stent system. Final arteriography demonstrates brisk flow with minimal residual filling defect. There is a palpable thrill at the graft. IMPRESSION: 1. 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 2. Successful pharmacomechanical thrombolysis/thrombectomy procedure using a combination of tPA, Angiojet and the Arrow percutaneous thrombectomy device. 3. Successful angioplasty of in stent stenosis within the left innominate stent system using a 10 x 40 mm  Conquest balloon. ACCESS: This access remains amenable to future percutaneous interventions as clinically indicated. Electronically Signed   By: Jacqulynn Cadet M.D.   On: 06/23/2017 17:06    Cardiac Studies   Echocardiogram 08/2016: Study Conclusions  - Left ventricle: The cavity size was normal. Wall thickness was   increased in a pattern of moderate LVH. Systolic function was   normal. The estimated ejection fraction was in the range of 60%   to 65%. Wall motion was normal; there were no regional wall   motion abnormalities. Doppler parameters are consistent with   abnormal left ventricular relaxation (grade 1 diastolic   dysfunction). The E/e&' ratio is between 8-15, suggesting   indeterminate LV filling pressure. - Mitral valve: Calcified annulus. Mildly thickened leaflets . - Atrial septum: No defect or patent foramen ovale was identified. - Tricuspid valve: There was mild regurgitation. - Pulmonary arteries: PA peak pressure: 34 mm Hg (S). - Inferior vena cava: The vessel was normal in size. The   respirophasic diameter changes were in the normal range (>= 50%),   consistent with normal central venous pressure.  Impressions:  - LVEF 60-65%, moderate LVH, normal wall motion, grade 1 diastolic   dysfunction, indeterminate LV filling pressure,  mild TR, RVSP 34   mmHg, normal IVC.   Patient Profile     27F with a history of end-stage renal disease on HD, peripheral vascular disease status post bilateral lower extremity amputations, diabetes, hypertension, HCV, who presents with chest pain during elective thrombectomy of left upper extremity fistula, and was found to have NSTEMI.   Assessment & Plan    1. NSTEMI: p/w chest pain and SOB. Trop 2.2>18.94. EKG with sinus rhythm with LBBB (chronic). Currently chest pain free. She is extremely malnutrition and debilitated.  - Continue heparin gtt - respiratory notified to obtain arterial heparin level given difficult venous  access.  - Consider nitro gtt if CP reoccurs - Continue ASA and statin - Tentatively planned for LHC today - keep NPO - Echo pending - F/u Lipid panel and hemoglobin A1C for risk stratification  2. SOB and cough: p/w CP and SOB. Also with wet, nonproductive cough that started in the past 24 hours. CXR with b/l pleural effusions and perihilar and lower lobe opacities - recommended CT chest for further evaluation.  Last echo 12/2016 with G1DD. With significant LE edema which may be contributed to by poor nutritional status but could be acute on chronic diastolic CHF. Cough could be 2/2 PNA given opacities on CXR.  - Echo pending to assess LV function  - Will check BNP - Will check CBC with diff and procalcitonin for evaluation for infection - Can consider IV lasix pending above - Will defer further imaging/ consideration for antibiotics to primary team  3. HTN: BP soft but stable - Continue current regimen  4. 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 K 3.4, Cr 3.2 on labs yesterday. Unable to obtain labs this morning due to poor venous access. Respiratory notified to obtain arterial labs.  - Replete potassium if still low on repeat labs  5. Malnutrition:  - Continue nutritional supplements per nutrition  6. Sacral Ulcer: Wound care following - Continue management per primary team    For questions or updates, please contact [place] Please consult [email or web address] for contact info under Cardiology/STEMI.      Signed, Abigail Butts, PA-C  06/24/2017, 9:48 AM   [phone]  History and all data above reviewed.  Patient examined.  I agree with the findings as above.  The patient is chronically ill.  She has no chest pain.  She has missed dialysis because of vascular access issues.  She has anasarca.  She has had an NSTEMI but without chest pain currently and she has chronic LBBB.    The patient exam reveals COR:RRR  ,  Lungs: Decreased breath sounds  ,   Abd: Positive bowel sounds, no rebound no guarding , Ext Diffuse edema, bilateral AKA  .  All available labs, radiology testing, previous records reviewed. Agree with documented assessment and plan.  NSTEMI:  She has multiple comorbid issues and she is not having acute chest pain or new EKG changes on top of her old LBBB.  She has anasarca with missed dialysis.  At this point she is not a candidate for nor is she in need of acute cardiac cath.  Echo is ongoing and we will review this.  Continue heparin, beta blocker and ASA (reduce dose).  Discussed with primary care team and they will discuss with nephrology.   Jeneen Rinks Bryann Gentz  11:46 AM  06/24/2017

## 2017-06-24 NOTE — Progress Notes (Signed)
  Echocardiogram 2D Echocardiogram has been performed.  [name] 06/24/2017, 12:32 PM

## 2017-06-24 NOTE — Consult Note (Signed)
[facility] Nurse wound consult note Reason for Consult: sacral wound Wound type: Pressure injury to coccyx Pressure Injury POA: Yes Measurement: The coccyx area has two small, shallow areas that are separated by an epithelial island.  The area of both wounds measures 2 cm x 0.8 cm x no appreciable depth.   Wound beds are 100% pink, clean, needing to re-epithelialize. Drainage (amount, consistency, odor):  No drainage, no odor Periwound: hypopigmented, with epithelial tissue present. Dressing procedure/placement/frequency:  Foam dressing, change every 3 days and prn.   The thoracic back had a foam dressing and when I peeled it down I found an unstageable wound along the spine that measures 1 cm x 0.6 cm x unknown depth due to the yellow slough in the wound bed.  The surrounding area is firm and darkened.  Care for this area is to apply Santyl, cover with saline moistened gauze, then dry gauze or ABD pad.  Change daily. Also, monitor sacral and thoracic back wounds for deterioration in condition such as enlarging size, increase in slough, signs of infection.  If observed, notify medical team. Thank you for the consult.  Discussed plan of care with the patient and bedside nurse.  [place] nurse will not follow at this time.  Please re-consult the [facility] team if needed.  Val Riles, RN, MSN, CWOCN, CNS-BC, pager [phone]

## 2017-06-24 NOTE — Progress Notes (Addendum)
Initial Nutrition Assessment  DOCUMENTATION CODES:   Not applicable  INTERVENTION:  D/c Ensure Enlive Recommend:  Nepro Shake po BID, each supplement provides 425 kcal and 19 grams protein Juven BID each supplement provids 80 kcal and 14 grams of protein  NUTRITION DIAGNOSIS:   Increased nutrient needs related to wound healing as evidenced by estimated needs.  GOAL:   Patient will meet greater than or equal to 90% of their needs  MONITOR:   Diet advancement, Labs, I & O's, Weight trends  REASON FOR ASSESSMENT:   Malnutrition Screening Tool    ASSESSMENT:   84 y.o. F admitted for NSTEMI. PMH of htn, hyperlipidemia, T2DM, PVD, ESRD on HD, anemia, HCV, breast cancer. PSH of bilateral AKA (2017) and posterior fusion cervical spine.   Pt was very lethargic and couldn't give detailed nutrition history. Pt reports that her weight loss was due to fluid fluctuations from dialysis, but could not recall a dry weight or UBW. Pt reports that all she wants is some eggs, but no one will give them to her; this is due to her current NPO order. Pt reports that she eats as much as she want, but could give a specific amount or intake hx; pt states "I tell my renal dietitian what I eat and she says that it's good".   Current supplements: Ensure Enlive BID  Medications reviewed: sensipar, rena-vit.  Labs reviewed: troponin 18.94 (H). 06/23/17: K+ 3.4 (L), Cl 98 (L), BG 38 (L) & 67 (WNL), hemoglobin 11.6 (L), HCT 34 (L).    Intake/Output Summary (Last 24 hours) at 06/24/2017 1342 Last data filed at 06/24/2017 1100 Gross per 24 hour  Intake 79.52 ml  Output 0 ml  Net 79.52 ml   Lab Results  Component Value Date   HGBA1C 5.2 11/17/2015    Suspected malnutrition due to moderate fat/muscle depletions, but unable to identify at this time due to lack of nutrition and weight history.  NUTRITION - FOCUSED PHYSICAL EXAM:    Most Recent Value  Orbital Region  Moderate depletion  Upper Arm  Region  Unable to assess  Thoracic and Lumbar Region  Unable to assess  Buccal Region  Moderate depletion  Temple Region  Moderate depletion  Clavicle Bone Region  Moderate depletion  Clavicle and Acromion Bone Region  Moderate depletion  Scapular Bone Region  Unable to assess  Dorsal Hand  Moderate depletion  Patellar Region  Unable to assess  Anterior Thigh Region  Unable to assess  Posterior Calf Region  Unable to assess  Edema (RD Assessment)  None  Hair  Reviewed  Eyes  Reviewed  Mouth  Reviewed  Skin  Reviewed  Nails  Reviewed       Diet Order:  Diet renal/carb modified with fluid restriction Diet-HS Snack? Nothing; Fluid restriction: 1200 mL Fluid; Room service appropriate? Yes; Fluid consistency: Thin  EDUCATION NEEDS:   Education needs have been addressed(Discussed with pt that her protein and calorie needs were increased due for wound healing )  Skin:  Skin Assessment: Skin Integrity Issues: Skin Integrity Issues:: Unstageable Unstageable: Sacral wound  Last BM:  06/24/17  Height:   Ht Readings from Last 1 Encounters:  06/23/17 5\' 3"  (1.6 m)    Weight:  Highly suspect that last weight is not accurate. Suspect weight fluctuations due to fluid changes (dialysis pt).   Wt Readings from Last 1 Encounters:  06/24/17 43 lb 11.2 oz (19.8 kg)   Wt Readings from Last 3 Encounters:  06/24/17  43 lb 11.2 oz (19.8 kg)  05/05/17 118 lb 6.4 oz (53.7 kg)  03/21/17 123 lb (55.8 kg)   UBW: Unknown %UBW: Unknown  Ideal Body Weight:  43.9 kg  BMI:  Body mass index is 7.74 kg/m.  Estimated Nutritional Needs:   Kcal:  1300-1500 kcal  Protein:  70-85 grams  Fluid:  1.3-1.5 L    Hope Budds, Dietetic Intern

## 2017-06-24 NOTE — Progress Notes (Addendum)
PROGRESS NOTE    [name]   NWG:[id number]  DOB: 28-Jul-1932  DOA: 06/23/2017 PCP: [name], MD   Brief Narrative:  [name] is a 84 y.o. female with ESRD on HD , PAD with b/l AKA,  HTN, Hep C,  breast Ca who is a very erratic historian and so most of the history is obtained from the chart.   She tells me she has not had dialysis since Monday due to issues with her access. Per chart, she presented for declotting of her left arm AVG yesterday by SCAT and was noted to be drowsy. CBG was 53 (she is not on any medication for her diabetes). She was given treatment for her sugar and declotted. She subsequently had dyspnea and maybe chest pain and was sent to the ED. Admitted to [facility] Troponin 2.20. Cardiology saw her in the evening. Started on Heparin infusion with plans for cath the next day.   Troponin 18 today, CBGs registering sugars < 10. Not able to obtain enough blood for labs. Cannot place central line due to ongoing Heparin infusion. Cardiology does not plan on cath. We will obtain blood for labs on dialysis today and tomorrow.  Subjective: No chest pain and currently no dyspnea.   Assessment & Plan:   Principal Problem:   NSTEMI (non-ST elevated myocardial infarction)  - in setting of inadequate dialysis and declotting of graft yesterday - in light of these other issues, cardiology does not plan on cath as her elevated troponin but would like to aggressive manage medically- recommending to cont Heparin, ASA and Metoprolol. - troponin now 18 Addendum: troponin now 30 ECHO>> Left ventricle: The estimated ejection   fraction was in the range of 55% to 60%. Although no diagnostic   regional wall motion abnormality was identified, this possibility   cannot be completely excluded on the basis of this study. The   study is not technically sufficient to allow evaluation of LV   diastolic function. - Ventricular septum: Septal motion showed paradox.  These changes   are consistent with intraventricular conduction delay. - Aortic valve: There was trivial regurgitation. - Mitral valve: Calcified annulus. Mildly thickened, mildly   calcified leaflets . - Pericardium, extracardiac: There was a left pleural effusion.   ESRD/  Hypertension/ acute hypoxic resp failure due to fluid overload - per patient she has not been dialyzed since last week. She went for dialysis on Monday but was not dialyzed due to issues with graft - she has severe pitting edema, pulm edema and is hypoxic - Nephrology has seen her today and she is being dialyzed- will be dialyzed again tomorrow but not on Sunday  -- leave BP high for now as this will allow removal of fluid- on Metoprolol  Hypoglycemia - last A1c in computer was 5.2 in 2017  - Bmet recorded sugars of 38 and 67 and finger sticks are  < 10, 21, 39- likely inaccurate- she was awake and alert with these low sugars- stop finger sticks     Breast cancer of lower-outer quadrant of right female breast  - anastrozole   Dyslipidemia - Lipitor     Severe pitting edema -  Nepro, Prostat - check albumin level - Addendum: found to be 3.1- see if edema improves with dialysis    S/P AKA (above knee amputation) bilateral    Sacral decubitus ulcer - cont wound care per RNs  Dicussed with RN, Dr Jonnie Finner, Dr Percival Spanish and Roby Lofts, PA  DVT prophylaxis: Heparin infusion Code Status: Full code Family Communication: she lives with her niece who is her POA Disposition Plan: critically ill- follow in SDU- if she does not improve with daily dialysis treatments, may need to consult palliative care Consultants:   Cardiology  nephrology Procedures:   Declotting of  AVG 4/18 Antimicrobials:  Anti-infectives (From admission, onward)   None       Objective: Vitals:   06/24/17 0055 06/24/17 0140 06/24/17 0414 06/24/17 0739  BP:   (!) 132/50 (!) 110/43  Pulse:  78 71 (!) 47  Resp: (!) 26 (!)  28 14 (!) 21  Temp:   97.7 F (36.5 C) 98.7 F (37.1 C)  TempSrc:   Oral Oral  SpO2:  92% 97% 94%  Weight:      Height:        Intake/Output Summary (Last 24 hours) at 06/24/2017 1231 Last data filed at 06/24/2017 0900 Gross per 24 hour  Intake 27.52 ml  Output 0 ml  Net 27.52 ml   Filed Weights   06/23/17 1719 06/24/17 0043  Weight: 53.5 kg (118 lb) 19.8 kg (43 lb 11.2 oz)    Examination: General exam: Appears comfortable  HEENT: PERRLA, oral mucosa moist, no sclera icterus or thrush Respiratory system: very coarse breath sounds when listened to anteriorly- Respiratory effort normal. On 3 L O2 pulse ox in high 90s Cardiovascular system: S1 & S2 heard, RRR.   Gastrointestinal system: Abdomen soft, non-tender, nondistended. Normal bowel sound.   Central nervous system: Alert and oriented. No focal neurological deficits. Extremities: No cyanosis, clubbing -  pitting edema in all extermities Psychiatry:  Mood & affect appropriate.     Data Reviewed: I have personally reviewed following labs and imaging studies  CBC: Recent Labs  Lab 06/23/17 1535 06/23/17 1544  HGB 8.8* 11.6*  HCT 26.0* 71.2*   Basic Metabolic Panel: Recent Labs  Lab 06/23/17 1535 06/23/17 1544  NA 101* 139  K 3.6 3.4*  CL 87* 98*  GLUCOSE 38* 67  BUN 34* 27*  CREATININE 2.50* 3.20*   GFR: Estimated Creatinine Clearance: 4.2 mL/min (A) (by C-G formula based on SCr of 3.2 mg/dL (H)). Liver Function Tests: No results for input(s): AST, ALT, ALKPHOS, BILITOT, PROT, ALBUMIN in the last 168 hours. No results for input(s): LIPASE, AMYLASE in the last 168 hours. No results for input(s): AMMONIA in the last 168 hours. Coagulation Profile: No results for input(s): INR, PROTIME in the last 168 hours. Cardiac Enzymes: Recent Labs  Lab 06/23/17 1900 06/23/17 2256 06/24/17 0701  TROPONINI 2.20* 2.32* 18.94*   BNP (last 3 results) No results for input(s): PROBNP in the last 8760  hours. HbA1C: No results for input(s): HGBA1C in the last 72 hours. CBG: Recent Labs  Lab 06/23/17 1431 06/24/17 1047 06/24/17 1049 06/24/17 1155 06/24/17 1158  GLUCAP 98 <10* <10* 21* 39*   Lipid Profile: No results for input(s): CHOL, HDL, LDLCALC, TRIG, CHOLHDL, LDLDIRECT in the last 72 hours. Thyroid Function Tests: No results for input(s): TSH, T4TOTAL, FREET4, T3FREE, THYROIDAB in the last 72 hours. Anemia Panel: No results for input(s): VITAMINB12, FOLATE, FERRITIN, TIBC, IRON, RETICCTPCT in the last 72 hours. Urine analysis: No results found for: COLORURINE, APPEARANCEUR, LABSPEC, PHURINE, GLUCOSEU, HGBUR, BILIRUBINUR, KETONESUR, PROTEINUR, UROBILINOGEN, NITRITE, LEUKOCYTESUR Sepsis Labs: @LABRCNTIP (procalcitonin:4,lacticidven:4) ) Recent Results (from the past 240 hour(s))  MRSA PCR Screening     Status: None   Collection Time: 06/24/17 12:46 AM  Result Value Ref Range Status  MRSA by PCR NEGATIVE NEGATIVE Final    Comment:        The GeneXpert MRSA Assay (FDA approved for NASAL specimens only), is one component of a comprehensive MRSA colonization surveillance program. It is not intended to diagnose MRSA infection nor to guide or monitor treatment for MRSA infections. Performed at [facility], [place] [address] [place], [place] [address]          Radiology Studies: Dg Chest 2 View  Result Date: 06/23/2017 CLINICAL DATA:  Chest pain and shortness of breath which developed during interventional declotting procedure. EXAM: CHEST - 2 VIEW COMPARISON:  Chest radiograph 11/15/2015. FINDINGS: The heart is enlarged. Perihilar and lower lobe opacities are new from priors, along with small BILATERAL effusions. Etiology is uncertain. Pneumonia, pulmonary edema, or atelectasis are considerations. No osseous findings. Vascular stents are seen in the LEFT arm and LEFT central veins. LEFT axillary clips. IMPRESSION: Cardiomegaly with BILATERAL perihilar and lower  lobe opacities, uncertain significance. No immediate preprocedure chest is available for correlation, but the findings have worsened since the prior chest radiograph of 2017. Consider CTA chest for further evaluation. Electronically Signed   By: Staci Righter M.D.   On: 06/23/2017 19:07   Ir US Guide Vasc Access Left  Result Date: 06/23/2017 INDICATION: 84 year old female with end-stage renal disease on hemodialysis via a left upper extremity arteriovenous graft. Her access has clotted, she has not dialyzed since Monday. EXAM: 1. Ultrasound-guided antegrade access 2. Ultrasound-guided retrograde access 3. Central venogram 4. Thrombolysis/thrombectomy declot procedure using tPA, the Angiojet and the Arrow percutaneous thrombectomy device 5. Angioplasty of in stent stenosis within the left innominate vein stent system. MEDICATIONS: 5000 units heparin ANESTHESIA/SEDATION: No sedation. FLUOROSCOPY TIME:  Fluoroscopy Time: 11 minutes 42 seconds (45 mGy). COMPLICATIONS: None immediate. Patient did experience some chest pain and shortness of breath during the procedure as well as complaining of buttock pain, possibly due to a sacral decubitus ulcer. She also notes that she has no one at home to stay with her. For these reasons, after the procedure we transferred her to the emergency room for further evaluation prior to discharge. PROCEDURE: Informed written consent was obtained from the patient after a thorough discussion of the procedural risks, benefits and alternatives. All questions were addressed. Maximal Sterile Barrier Technique was utilized including caps, mask, sterile gowns, sterile gloves, sterile drape, hand hygiene and skin antiseptic. A timeout was performed prior to the initiation of the procedure. The left upper arm graft was interrogated with ultrasound and found to be completely thrombosed. An image was obtained and stored for the medical record. Local anesthesia was attained by infiltration with 1%  lidocaine. A small dermatotomy was made. Under real-time sonographic guidance, the graft was punctured with a 21 gauge micropuncture needle in antegrade fashion adjacent to the arterial anastomosis. Using standard technique, the initial micro needle was exchanged over a 0.018 micro wire for a transitional 4 [place] micro sheath. 2 mg of tPA was then instilled into the thrombus. A second retrograde access was identified farther up the arm. Local anesthesia was attained by infiltration with 1% lidocaine. A small dermatotomy was made. Under real-time sonographic guidance, the vessel was punctured with a 21 gauge micropuncture needle. Using standard technique, the initial micro needle was exchanged over a 0.018 micro wire for a transitional 4 [place] micro sheath. Another 2 mg of tPA was instilled into the thrombus from the retrograde access. Attention was again turned to the antegrade access adjacent to the  arterial anastomosis. A short Amplatz wire was advanced through the transitional micro sheath in the micro sheath was exchanged for a working 7 [place] vascular sheath. An angled catheter was then advanced over the wire and into the left subclavian vein. A central venogram was performed. There is a a combination of likely in stent stenosis and thrombus within the left innominate and subclavian venous stent system resulting in severe limitation of flow. Nonocclusive thrombus extends from the peripheral margin of the stent system throughout the left subclavian and axillary veins. Pull-back left upper extremity venogram confirms the extent of the thrombus as well as thrombus within the graft. Due to the volume of thrombus, the decision was made to proceed with debulking of the thrombus using the Angiojet. The catheter was navigated through the nearly occluded central stent system using a Bentson wire. The Bentson wire was then exchanged for a Rosen wire which was placed in the superior vena cava. A 6 French 90 cm Angiojet  was then advanced over the wire and the segments of vein from the innominate stent to the stent in the mid arm was thrombectomized. Multiple passes of the Angiojet was performed. Follow-up venography demonstrates marked reduction in clot burden. There is only some mild residual InStent stenosis within the stent system. No definite clot identified. The catheter was then removed. The percutaneous thrombectomy device was then advanced into the graft and the graft was thrombectomized using the device. Attention was then turned to the retrograde graft access. The micro sheath was exchanged over a short Amplatz wire for a working 6 [place] vascular sheath. The percutaneous thrombectomy device was then advanced into the brachial artery and used in basket fashion to pull the arterial plug back into the body of the graft where it was macerated. Aspiration was then performed. Both the antegrade and retrograde sheath demonstrate brisk bleeding consistent with restoration of flow. The angled catheter was then advanced over a Bentson wire into the brachial artery and a brachial arteriogram was performed. This confirms patency of the arterial anastomosis, the graft and the draining vein. There is persistent in stent stenosis versus peripheral adherent thrombus within the left subclavian stent system. Angioplasty was performed using a 10 x 40 mm Conquest balloon. Following angioplasty, there is some reduction of flow secondary to residual thrombus. The balloon was removed. The Angiojet device was reinserted. Additional thrombectomy was performed through the Angiojet within the stent system. Final arteriography demonstrates brisk flow with minimal residual filling defect. There is a palpable thrill at the graft. IMPRESSION: 1. 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 2. Successful pharmacomechanical thrombolysis/thrombectomy procedure using a combination of  tPA, Angiojet and the Arrow percutaneous thrombectomy device. 3. Successful angioplasty of in stent stenosis within the left innominate stent system using a 10 x 40 mm Conquest balloon. ACCESS: This access remains amenable to future percutaneous interventions as clinically indicated. Electronically Signed   By: Jacqulynn Cadet M.D.   On: 06/23/2017 17:06   Ir Thrombectomy Av Fistula W/thrombolysis Inc/shunt/img Left  Result Date: 06/23/2017 INDICATION: 84 year old female with end-stage renal disease on hemodialysis via a left upper extremity arteriovenous graft. Her access has clotted, she has not dialyzed since Monday. EXAM: 1. Ultrasound-guided antegrade access 2. Ultrasound-guided retrograde access 3. Central venogram 4. Thrombolysis/thrombectomy declot procedure using tPA, the Angiojet and the Arrow percutaneous thrombectomy device 5. Angioplasty of in stent stenosis within the left innominate vein stent system. MEDICATIONS: 5000 units heparin ANESTHESIA/SEDATION: No sedation.  FLUOROSCOPY TIME:  Fluoroscopy Time: 11 minutes 42 seconds (45 mGy). COMPLICATIONS: None immediate. Patient did experience some chest pain and shortness of breath during the procedure as well as complaining of buttock pain, possibly due to a sacral decubitus ulcer. She also notes that she has no one at home to stay with her. For these reasons, after the procedure we transferred her to the emergency room for further evaluation prior to discharge. PROCEDURE: Informed written consent was obtained from the patient after a thorough discussion of the procedural risks, benefits and alternatives. All questions were addressed. Maximal Sterile Barrier Technique was utilized including caps, mask, sterile gowns, sterile gloves, sterile drape, hand hygiene and skin antiseptic. A timeout was performed prior to the initiation of the procedure. The left upper arm graft was interrogated with ultrasound and found to be completely thrombosed. An image  was obtained and stored for the medical record. Local anesthesia was attained by infiltration with 1% lidocaine. A small dermatotomy was made. Under real-time sonographic guidance, the graft was punctured with a 21 gauge micropuncture needle in antegrade fashion adjacent to the arterial anastomosis. Using standard technique, the initial micro needle was exchanged over a 0.018 micro wire for a transitional 4 [place] micro sheath. 2 mg of tPA was then instilled into the thrombus. A second retrograde access was identified farther up the arm. Local anesthesia was attained by infiltration with 1% lidocaine. A small dermatotomy was made. Under real-time sonographic guidance, the vessel was punctured with a 21 gauge micropuncture needle. Using standard technique, the initial micro needle was exchanged over a 0.018 micro wire for a transitional 4 [place] micro sheath. Another 2 mg of tPA was instilled into the thrombus from the retrograde access. Attention was again turned to the antegrade access adjacent to the arterial anastomosis. A short Amplatz wire was advanced through the transitional micro sheath in the micro sheath was exchanged for a working 7 [place] vascular sheath. An angled catheter was then advanced over the wire and into the left subclavian vein. A central venogram was performed. There is a a combination of likely in stent stenosis and thrombus within the left innominate and subclavian venous stent system resulting in severe limitation of flow. Nonocclusive thrombus extends from the peripheral margin of the stent system throughout the left subclavian and axillary veins. Pull-back left upper extremity venogram confirms the extent of the thrombus as well as thrombus within the graft. Due to the volume of thrombus, the decision was made to proceed with debulking of the thrombus using the Angiojet. The catheter was navigated through the nearly occluded central stent system using a Bentson wire. The Bentson wire was  then exchanged for a Rosen wire which was placed in the superior vena cava. A 6 French 90 cm Angiojet was then advanced over the wire and the segments of vein from the innominate stent to the stent in the mid arm was thrombectomized. Multiple passes of the Angiojet was performed. Follow-up venography demonstrates marked reduction in clot burden. There is only some mild residual InStent stenosis within the stent system. No definite clot identified. The catheter was then removed. The percutaneous thrombectomy device was then advanced into the graft and the graft was thrombectomized using the device. Attention was then turned to the retrograde graft access. The micro sheath was exchanged over a short Amplatz wire for a working 6 [place] vascular sheath. The percutaneous thrombectomy device was then advanced into the brachial artery and used in basket fashion to pull the arterial plug  back into the body of the graft where it was macerated. Aspiration was then performed. Both the antegrade and retrograde sheath demonstrate brisk bleeding consistent with restoration of flow. The angled catheter was then advanced over a Bentson wire into the brachial artery and a brachial arteriogram was performed. This confirms patency of the arterial anastomosis, the graft and the draining vein. There is persistent in stent stenosis versus peripheral adherent thrombus within the left subclavian stent system. Angioplasty was performed using a 10 x 40 mm Conquest balloon. Following angioplasty, there is some reduction of flow secondary to residual thrombus. The balloon was removed. The Angiojet device was reinserted. Additional thrombectomy was performed through the Angiojet within the stent system. Final arteriography demonstrates brisk flow with minimal residual filling defect. There is a palpable thrill at the graft. IMPRESSION: 1. Relatively large central thrombus burden beginning within the left innominate and subclavian venous stent  system. 2. Successful pharmacomechanical thrombolysis/thrombectomy procedure using a combination of tPA, Angiojet and the Arrow percutaneous thrombectomy device. 3. Successful angioplasty of in stent stenosis within the left innominate stent system using a 10 x 40 mm Conquest balloon. ACCESS: This access remains amenable to future percutaneous interventions as clinically indicated. Electronically Signed   By: Jacqulynn Cadet M.D.   On: 06/23/2017 17:06   Ir Pta Addl Central Dialysis Seg Thru Dialy Circuit Left  Result Date: 06/23/2017 INDICATION: 84 year old female with end-stage renal disease on hemodialysis via a left upper extremity arteriovenous graft. Her access has clotted, she has not dialyzed since Monday. EXAM: 1. Ultrasound-guided antegrade access 2. Ultrasound-guided retrograde access 3. Central venogram 4. Thrombolysis/thrombectomy declot procedure using tPA, the Angiojet and the Arrow percutaneous thrombectomy device 5. Angioplasty of in stent stenosis within the left innominate vein stent system. MEDICATIONS: 5000 units heparin ANESTHESIA/SEDATION: No sedation. FLUOROSCOPY TIME:  Fluoroscopy Time: 11 minutes 42 seconds (45 mGy). COMPLICATIONS: None immediate. Patient did experience some chest pain and shortness of breath during the procedure as well as complaining of buttock pain, possibly due to a sacral decubitus ulcer. She also notes that she has no one at home to stay with her. For these reasons, after the procedure we transferred her to the emergency room for further evaluation prior to discharge. PROCEDURE: Informed written consent was obtained from the patient after a thorough discussion of the procedural risks, benefits and alternatives. All questions were addressed. Maximal Sterile Barrier Technique was utilized including caps, mask, sterile gowns, sterile gloves, sterile drape, hand hygiene and skin antiseptic. A timeout was performed prior to the initiation of the procedure. The left  upper arm graft was interrogated with ultrasound and found to be completely thrombosed. An image was obtained and stored for the medical record. Local anesthesia was attained by infiltration with 1% lidocaine. A small dermatotomy was made. Under real-time sonographic guidance, the graft was punctured with a 21 gauge micropuncture needle in antegrade fashion adjacent to the arterial anastomosis. Using standard technique, the initial micro needle was exchanged over a 0.018 micro wire for a transitional 4 [place] micro sheath. 2 mg of tPA was then instilled into the thrombus. A second retrograde access was identified farther up the arm. Local anesthesia was attained by infiltration with 1% lidocaine. A small dermatotomy was made. Under real-time sonographic guidance, the vessel was punctured with a 21 gauge micropuncture needle. Using standard technique, the initial micro needle was exchanged over a 0.018 micro wire for a transitional 4 [place] micro sheath. Another 2 mg of tPA was instilled into the thrombus  from the retrograde access. Attention was again turned to the antegrade access adjacent to the arterial anastomosis. A short Amplatz wire was advanced through the transitional micro sheath in the micro sheath was exchanged for a working 7 [place] vascular sheath. An angled catheter was then advanced over the wire and into the left subclavian vein. A central venogram was performed. There is a a combination of likely in stent stenosis and thrombus within the left innominate and subclavian venous stent system resulting in severe limitation of flow. Nonocclusive thrombus extends from the peripheral margin of the stent system throughout the left subclavian and axillary veins. Pull-back left upper extremity venogram confirms the extent of the thrombus as well as thrombus within the graft. Due to the volume of thrombus, the decision was made to proceed with debulking of the thrombus using the Angiojet. The catheter was  navigated through the nearly occluded central stent system using a Bentson wire. The Bentson wire was then exchanged for a Rosen wire which was placed in the superior vena cava. A 6 French 90 cm Angiojet was then advanced over the wire and the segments of vein from the innominate stent to the stent in the mid arm was thrombectomized. Multiple passes of the Angiojet was performed. Follow-up venography demonstrates marked reduction in clot burden. There is only some mild residual InStent stenosis within the stent system. No definite clot identified. The catheter was then removed. The percutaneous thrombectomy device was then advanced into the graft and the graft was thrombectomized using the device. Attention was then turned to the retrograde graft access. The micro sheath was exchanged over a short Amplatz wire for a working 6 [place] vascular sheath. The percutaneous thrombectomy device was then advanced into the brachial artery and used in basket fashion to pull the arterial plug back into the body of the graft where it was macerated. Aspiration was then performed. Both the antegrade and retrograde sheath demonstrate brisk bleeding consistent with restoration of flow. The angled catheter was then advanced over a Bentson wire into the brachial artery and a brachial arteriogram was performed. This confirms patency of the arterial anastomosis, the graft and the draining vein. There is persistent in stent stenosis versus peripheral adherent thrombus within the left subclavian stent system. Angioplasty was performed using a 10 x 40 mm Conquest balloon. Following angioplasty, there is some reduction of flow secondary to residual thrombus. The balloon was removed. The Angiojet device was reinserted. Additional thrombectomy was performed through the Angiojet within the stent system. Final arteriography demonstrates brisk flow with minimal residual filling defect. There is a palpable thrill at the graft. IMPRESSION: 1.  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 2. Successful pharmacomechanical thrombolysis/thrombectomy procedure using a combination of tPA, Angiojet and the Arrow percutaneous thrombectomy device. 3. Successful angioplasty of in stent stenosis within the left innominate stent system using a 10 x 40 mm Conquest balloon. ACCESS: This access remains amenable to future percutaneous interventions as clinically indicated. Electronically Signed   By: Jacqulynn Cadet M.D.   On: 06/23/2017 17:06      Scheduled Meds: . anastrozole  1 mg Oral Daily  . aspirin  81 mg Oral Daily  . atorvastatin  80 mg Oral q1800  . calcitRIOL  1.5 mcg Oral Q M,W,F-HD  . cinacalcet  30 mg Oral Q breakfast  . collagenase   Topical Daily  . feeding supplement (ENSURE ENLIVE)  237 mL Oral BID BM  . feeding  supplement (PRO-STAT SUGAR FREE 64)  30 mL Oral BID  . lanthanum  1,000 mg Oral QPC supper  . lidocaine  1 patch Transdermal Q24H  . metoprolol succinate  25 mg Oral QHS  . multivitamin  1 tablet Oral QPC supper  . sodium chloride flush  3 mL Intravenous Q12H   Continuous Infusions: . sodium chloride    . sodium chloride    . heparin 650 Units/hr (06/23/17 2246)     LOS: 1 day    Time spent in minutes: [place], MD [facility] Pager: [email or web address] Password [facility] 06/24/2017, 12:31 PM

## 2017-06-24 NOTE — Progress Notes (Signed)
Pharmacist Jeani Hawking, cardiologists PAs Ellen Henri and Daleen Snook [employer] made aware of 4 phlebotomists were unable to collect lab specimen to run heparin level ordered  on patient after 7 sticks. [name] [name], BSN, RN

## 2017-06-24 NOTE — Consult Note (Addendum)
[facility] Renal Consultation Note    Indication for Consultation:  Management of ESRD/hemodialysis; anemia, hypertension/volume and secondary hyperparathyroidism  HPI: [name] is a 84 y.o. female [facility] DM, HTN, Hep C, PVD s/p bilateral AKAs, Hx breast Ca.   She presented for her usual dialysis on 4/17 but was found to have her AVG clotted. Underwent successful declot/angioplasty of relatively large central thrombus per IR yesterday. After the procedure she developed CP/SOB. Found to have troponin >2, EKG with chronic LBBB,  heparin drip started for NSTEMI/ACS. Cardiology tentatively planning LHC today.  CXR with bilateral pleural effusions and perihilar and lower lobe opacities.   Seen in room patient is very weak and debilitated. Per charge nurse at dialysis center, patient resides at home with relative who is HCPOA. Spends most of her time in wheelchair and been getting progressively weaker. Complains frequently of back and buttocks pain during HD and needs frequent repositioning and assistance.   Pt's BP's at OP HD have been normal to high.  She is coming in at her dry wt and hasn't had much fluid pulled off for several HD sessions.  She does not miss a lot of HD, was clotted and missed HD this Wednesday.    Past Medical History:  Diagnosis Date  . Anemia   . Arthritis   . Bilateral breast cancer ([place])   . DM (diabetes mellitus) ([place])   . Dyslipidemia   . ESRD (end stage renal disease) on dialysis ([place])    a. [place]: MWF (07/10/2014)  . Hepatitis C   . Hyperparathyroidism   . Hypertension   . PVD (peripheral vascular disease) ([place])    a. s/p peripheral angiogram on 07/10/14 with no PCI and cont med Rx  . Vascular disease    Past Surgical History:  Procedure Laterality Date  . ABDOMINAL HYSTERECTOMY    . AMPUTATION Bilateral 11/21/2015   Procedure: AMPUTATION ABOVE KNEE;  Surgeon: Newt Minion, MD;  Location: [place];  Service: Orthopedics;  Laterality: Bilateral;  . APPENDECTOMY    . ARTERIOVENOUS GRAFT PLACEMENT Left 03/20/09   "had right arm previously but couldn't use it anymore"  . BACK SURGERY    . BREAST BIOPSY Bilateral   . BREAST BIOPSY Right 08/08/2013   Procedure: Evacuation Hematoma Right Chest;  Surgeon: [name], MD;  Location: [facility];  Service: General;  Laterality: Right;  . CATARACT EXTRACTION Right   . DG AV DIALYSIS GRAFT DECLOT OR Left 01/27/11, 02/15/11, 03/15/11, 10/13/11   lua  . EVACUATION BREAST HEMATOMA  2001; 2015   left; right  . INSERTION OF DIALYSIS CATHETER Right 11/15/2015   Procedure: INSERTION OF Right Femoral  DIALYSIS CATHETER.;  Surgeon: Rosetta Posner, MD;  Location: [facility] OR;  Service: Vascular;  Laterality: Right;  . IR PTA ADDL CENTRAL DIALYSIS SEG THRU DIALY CIRCUIT LEFT Left 06/23/2017  . IR THROMBECTOMY AV FISTULA W/THROMBOLYSIS INC/SHUNT/IMG LEFT Left 06/23/2017  . IR US GUIDE VASC ACCESS LEFT  06/23/2017  . MASTECTOMY Left 1970's?  Marland Kitchen MASTECTOMY COMPLETE / SIMPLE Right 08/07/2013  . MEDIAN NERVE REPAIR Left    "decompression"  . PERIPHERAL VASCULAR CATHETERIZATION N/A 07/10/2014   Procedure: Abdominal Aortogram;  Surgeon: Wellington Hampshire, MD;  Location: [facility] IN[facility]ASIVE [facility]UPID;  Service: Cardiovascular;  Laterality: N/A;  . POSTERIOR FUSION CERVICAL SPINE     "have 6 screws"  . THROMBECTOMY AND REVISION OF ARTERIOVENTOUS (AV) GORETEX  GRAFT Left 04/18/2009  . THROMBECTOMY AND REVISION OF ARTERIOVENTOUS (AV) GORETEX  GRAFT Left 11/15/2015   Procedure: Thrombectomy and Revision of Left ARm AV Gortex Graft.;  Surgeon: Rosetta Posner, MD;  Location: [place];  Service: Vascular;  Laterality: Left;  . TOE AMPUTATION Right    1,2nd toes  . TONSILLECTOMY    . TOTAL MASTECTOMY Right 08/07/2013   Procedure: RIGHT TOTAL MASTECTOMY;  Surgeon: [name], MD;  Location: [place];  Service: General;  Laterality: Right;  . UTERINE FIBROID SURGERY     Family History  Problem  Relation Age of Onset  . Cancer Mother   . Hypertension Mother    Social History:  reports that she has quit smoking. Her smoking use included cigarettes. She has never used smokeless tobacco. She reports that she drank alcohol. She reports that she does not use drugs. Allergies  Allergen Reactions  . Aspirin Other (See Comments)    NO BLOOD THINNERS OF ANY KIND-bleeding events   Prior to Admission medications   Medication Sig Start Date End Date Taking? Authorizing Provider  anastrozole (ARIMIDEX) 1 MG tablet TAKE 1 TABLET BY MOUTH DAILY 06/01/17  Yes Shadad, Mathis Dad, MD  B Complex-C-Folic Acid (DIALYVITE TABLET) TABS Take 1 tablet by mouth daily after supper. 10/31/15  Yes [provider]  cinacalcet (SENSIPAR) 30 MG tablet Take 2 tablets (60 mg total) by mouth every Monday, Wednesday, and Friday with hemodialysis. Patient taking differently: Take 30 mg by mouth daily.  08/27/16  Yes Theodis Blaze, MD  collagenase (SANTYL) ointment Apply topically daily. Patient taking differently: Apply 1 application topically daily.  08/26/16  Yes Theodis Blaze, MD  ENSURE (ENSURE) Take 237 mLs by mouth 2 (two) times daily between meals.   Yes [provider]  lanthanum (FOSRENOL) 1000 MG chewable tablet Chew 1,000 mg by mouth daily after supper.    Yes [provider]  lidocaine (LIDODERM) 5 % Place 1 patch onto the skin daily. Remove & Discard patch within 12 hours or as directed by MD 05/30/17  Yes Leaphart, Zack Seal, PA-C  metoprolol succinate (TOPROL XL) 25 MG 24 hr tablet Take 0.5 tablets (12.5 mg total) by mouth daily. Patient taking differently: Take 25 mg by mouth at bedtime.  08/27/16  Yes Theodis Blaze, MD  oxyCODONE-acetaminophen (PERCOCET) 5-325 MG tablet Take 1 tablet by mouth every 6 (six) hours as needed for severe pain. 05/30/17  Yes Little, Wenda Overland, MD  Amino Acids-Protein Hydrolys (FEEDING SUPPLEMENT, PRO-STAT SUGAR FREE 64,) LIQD Take 30 mLs by mouth 2  (two) times daily. Patient not taking: Reported on 03/21/2017 08/26/16   Theodis Blaze, MD  calcitRIOL (ROCALTROL) 0.25 MCG capsule Take 3 capsules (0.75 mcg total) by mouth every Monday, Wednesday, and Friday with hemodialysis. Patient not taking: Reported on 06/23/2017 08/27/16   Theodis Blaze, MD  Nutritional Supplements (FEEDING SUPPLEMENT, NEPRO CARB STEADY,) LIQD Take 237 mLs by mouth 2 (two) times daily between meals. Patient not taking: Reported on 03/21/2017 11/24/15   Elgergawy, Silver Huguenin, MD  oxyCODONE (OXY IR/ROXICODONE) 5 MG immediate release tablet Take 1 tablet (5 mg total) by mouth every 4 (four) hours as needed for moderate pain. Patient not taking: Reported on 03/21/2017 08/26/16   Theodis Blaze, MD   Current Facility-Administered Medications  Medication Dose Route Frequency Provider Last Rate Last Dose  . acetaminophen (TYLENOL) tablet 650 mg  650 mg Oral Q4H PRN Ivor Costa, MD      .  ALPRAZolam Duanne Moron) tablet 0.25 mg  0.25 mg Oral BID PRN Ivor Costa, MD      . anastrozole (ARIMIDEX) tablet 1 mg  1 mg Oral Daily Ivor Costa, MD      . aspirin chewable tablet 324 mg  324 mg Oral Daily Ivor Costa, MD      . atorvastatin (LIPITOR) tablet 80 mg  80 mg Oral q1800 Ivor Costa, MD   80 mg at 06/24/17 0139  . cinacalcet (SENSIPAR) tablet 30 mg  30 mg Oral Q breakfast Ivor Costa, MD   30 mg at 06/24/17 3244  . collagenase (SANTYL) ointment   Topical Daily Rizwan, Saima, MD      . dextrose 50 % solution 50 mL  50 mL Intravenous PRN Ivor Costa, MD      . dextrose 50 % solution           . feeding supplement (ENSURE ENLIVE) (ENSURE ENLIVE) liquid 237 mL  237 mL Oral BID BM Ivor Costa, MD      . heparin ADULT infusion 100 units/mL (25000 units/261mL sodium chloride 0.45%)  650 Units/hr Intravenous Continuous Ivor Costa, MD 6.5 mL/hr at 06/23/17 2246 650 Units/hr at 06/23/17 2246  . hydrALAZINE (APRESOLINE) injection 5 mg  5 mg Intravenous Q2H PRN Ivor Costa, MD      . lanthanum Lucretia Kern)  chewable tablet 1,000 mg  1,000 mg Oral QPC supper Ivor Costa, MD      . lidocaine (LIDODERM) 5 % 1 patch  1 patch Transdermal Q24H Ivor Costa, MD   1 patch at 06/24/17 0145  . metoprolol succinate (TOPROL-XL) 24 hr tablet 25 mg  25 mg Oral QHS Ivor Costa, MD   25 mg at 06/24/17 0137  . morphine 4 MG/ML injection 0.52 mg  0.52 mg Intravenous Q3H PRN Ivor Costa, MD      . multivitamin (RENA-VIT) tablet 1 tablet  1 tablet Oral QPC supper Ivor Costa, MD      . nitroGLYCERIN (NITROSTAT) SL tablet 0.4 mg  0.4 mg Sublingual Q5 min PRN Ivor Costa, MD      . ondansetron [facility]) injection 4 mg  4 mg Intravenous Q6H PRN Ivor Costa, MD      . oxyCODONE-acetaminophen (PERCOCET/ROXICET) 5-325 MG per tablet 1 tablet  1 tablet Oral Q6H PRN Ivor Costa, MD      . zolpidem (AMBIEN) tablet 5 mg  5 mg Oral QHS PRN Ivor Costa, MD        ROS: As per HPI otherwise negative.  Physical Exam: Vitals:   06/24/17 0055 06/24/17 0140 06/24/17 0414 06/24/17 0739  BP:   (!) 132/50 (!) 110/43  Pulse:  78 71 (!) 47  Resp: (!) 26 (!) 28 14 (!) 21  Temp:   97.7 F (36.5 C) 98.7 F (37.1 C)  TempSrc:   Oral Oral  SpO2:  92% 97% 94%  Weight:      Height:         General: Frail chronically illl appearing elderly female lying in bed  Head: NCAT sclera not icteric MMM Neck: Supple. No JVD No masses Lungs: Diminished bilaterally  Heart: RRR with S1 S2 Abdomen: soft NT + BS Lower extremities: pitting edema bilaterally from stumps to flanks; some upper extremity edema L>R Neuro: A & O  X 3. Moves all extremities spontaneously. Psych:  Responds to questions appropriately with a normal affect. Dialysis Access: LUE AVG bandaged +bruit   Labs: Basic Metabolic Panel: Recent Labs  Lab 06/23/17 1535 06/23/17  1544  NA 101* 139  K 3.6 3.4*  CL 87* 98*  GLUCOSE 38* 67  BUN 34* 27*  CREATININE 2.50* 3.20*   Liver Function Tests: No results for input(s): AST, ALT, ALKPHOS, BILITOT, PROT, ALBUMIN in the last 168  hours. No results for input(s): LIPASE, AMYLASE in the last 168 hours. No results for input(s): AMMONIA in the last 168 hours. CBC: Recent Labs  Lab 06/23/17 1535 06/23/17 1544  HGB 8.8* 11.6*  HCT 26.0* 34.0*   Cardiac Enzymes: Recent Labs  Lab 06/23/17 1900 06/23/17 2256 06/24/17 0701  TROPONINI 2.20* 2.32* 18.94*   CBG: Recent Labs  Lab 06/23/17 1303 06/23/17 1402 06/23/17 1431 06/24/17 1047 06/24/17 1049  GLUCAP 58* 59* 98 <10* <10*   Iron Studies: No results for input(s): IRON, TIBC, TRANSFERRIN, FERRITIN in the last 72 hours. Studies/Results: Dg Chest 2 View  Result Date: 06/23/2017 CLINICAL DATA:  Chest pain and shortness of breath which developed during interventional declotting procedure. EXAM: CHEST - 2 VIEW COMPARISON:  Chest radiograph 11/15/2015. FINDINGS: The heart is enlarged. Perihilar and lower lobe opacities are new from priors, along with small BILATERAL effusions. Etiology is uncertain. Pneumonia, pulmonary edema, or atelectasis are considerations. No osseous findings. Vascular stents are seen in the LEFT arm and LEFT central veins. LEFT axillary clips. IMPRESSION: Cardiomegaly with BILATERAL perihilar and lower lobe opacities, uncertain significance. No immediate preprocedure chest is available for correlation, but the findings have worsened since the prior chest radiograph of 2017. Consider CTA chest for further evaluation. Electronically Signed   By: Staci Righter M.D.   On: 06/23/2017 19:07   Ir US Guide Vasc Access Left  Result Date: 06/23/2017 INDICATION: 84 year old female with end-stage renal disease on hemodialysis via a left upper extremity arteriovenous graft. Her access has clotted, she has not dialyzed since Monday. EXAM: 1. Ultrasound-guided antegrade access 2. Ultrasound-guided retrograde access 3. Central venogram 4. Thrombolysis/thrombectomy declot procedure using tPA, the Angiojet and the Arrow percutaneous thrombectomy device 5. Angioplasty  of in stent stenosis within the left innominate vein stent system. MEDICATIONS: 5000 units heparin ANESTHESIA/SEDATION: No sedation. FLUOROSCOPY TIME:  Fluoroscopy Time: 11 minutes 42 seconds (45 mGy). COMPLICATIONS: None immediate. Patient did experience some chest pain and shortness of breath during the procedure as well as complaining of buttock pain, possibly due to a sacral decubitus ulcer. She also notes that she has no one at home to stay with her. For these reasons, after the procedure we transferred her to the emergency room for further evaluation prior to discharge. PROCEDURE: Informed written consent was obtained from the patient after a thorough discussion of the procedural risks, benefits and alternatives. All questions were addressed. Maximal Sterile Barrier Technique was utilized including caps, mask, sterile gowns, sterile gloves, sterile drape, hand hygiene and skin antiseptic. A timeout was performed prior to the initiation of the procedure. The left upper arm graft was interrogated with ultrasound and found to be completely thrombosed. An image was obtained and stored for the medical record. Local anesthesia was attained by infiltration with 1% lidocaine. A small dermatotomy was made. Under real-time sonographic guidance, the graft was punctured with a 21 gauge micropuncture needle in antegrade fashion adjacent to the arterial anastomosis. Using standard technique, the initial micro needle was exchanged over a 0.018 micro wire for a transitional 4 [place] micro sheath. 2 mg of tPA was then instilled into the thrombus. A second retrograde access was identified farther up the arm. Local anesthesia was attained by infiltration with 1%  lidocaine. A small dermatotomy was made. Under real-time sonographic guidance, the vessel was punctured with a 21 gauge micropuncture needle. Using standard technique, the initial micro needle was exchanged over a 0.018 micro wire for a transitional 4 [place] micro  sheath. Another 2 mg of tPA was instilled into the thrombus from the retrograde access. Attention was again turned to the antegrade access adjacent to the arterial anastomosis. A short Amplatz wire was advanced through the transitional micro sheath in the micro sheath was exchanged for a working 7 [place] vascular sheath. An angled catheter was then advanced over the wire and into the left subclavian vein. A central venogram was performed. There is a a combination of likely in stent stenosis and thrombus within the left innominate and subclavian venous stent system resulting in severe limitation of flow. Nonocclusive thrombus extends from the peripheral margin of the stent system throughout the left subclavian and axillary veins. Pull-back left upper extremity venogram confirms the extent of the thrombus as well as thrombus within the graft. Due to the volume of thrombus, the decision was made to proceed with debulking of the thrombus using the Angiojet. The catheter was navigated through the nearly occluded central stent system using a Bentson wire. The Bentson wire was then exchanged for a Rosen wire which was placed in the superior vena cava. A 6 French 90 cm Angiojet was then advanced over the wire and the segments of vein from the innominate stent to the stent in the mid arm was thrombectomized. Multiple passes of the Angiojet was performed. Follow-up venography demonstrates marked reduction in clot burden. There is only some mild residual InStent stenosis within the stent system. No definite clot identified. The catheter was then removed. The percutaneous thrombectomy device was then advanced into the graft and the graft was thrombectomized using the device. Attention was then turned to the retrograde graft access. The micro sheath was exchanged over a short Amplatz wire for a working 6 [place] vascular sheath. The percutaneous thrombectomy device was then advanced into the brachial artery and used in basket  fashion to pull the arterial plug back into the body of the graft where it was macerated. Aspiration was then performed. Both the antegrade and retrograde sheath demonstrate brisk bleeding consistent with restoration of flow. The angled catheter was then advanced over a Bentson wire into the brachial artery and a brachial arteriogram was performed. This confirms patency of the arterial anastomosis, the graft and the draining vein. There is persistent in stent stenosis versus peripheral adherent thrombus within the left subclavian stent system. Angioplasty was performed using a 10 x 40 mm Conquest balloon. Following angioplasty, there is some reduction of flow secondary to residual thrombus. The balloon was removed. The Angiojet device was reinserted. Additional thrombectomy was performed through the Angiojet within the stent system. Final arteriography demonstrates brisk flow with minimal residual filling defect. There is a palpable thrill at the graft. IMPRESSION: 1. 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 2. Successful pharmacomechanical thrombolysis/thrombectomy procedure using a combination of tPA, Angiojet and the Arrow percutaneous thrombectomy device. 3. Successful angioplasty of in stent stenosis within the left innominate stent system using a 10 x 40 mm Conquest balloon. ACCESS: This access remains amenable to future percutaneous interventions as clinically indicated. Electronically Signed   By: Jacqulynn Cadet M.D.   On: 06/23/2017 17:06   Ir Thrombectomy Av Fistula W/thrombolysis Inc/shunt/img Left  Result Date: 06/23/2017 INDICATION: 84 year old female with end-stage renal  disease on hemodialysis via a left upper extremity arteriovenous graft. Her access has clotted, she has not dialyzed since Monday. EXAM: 1. Ultrasound-guided antegrade access 2. Ultrasound-guided retrograde access 3. Central venogram 4.  Thrombolysis/thrombectomy declot procedure using tPA, the Angiojet and the Arrow percutaneous thrombectomy device 5. Angioplasty of in stent stenosis within the left innominate vein stent system. MEDICATIONS: 5000 units heparin ANESTHESIA/SEDATION: No sedation. FLUOROSCOPY TIME:  Fluoroscopy Time: 11 minutes 42 seconds (45 mGy). COMPLICATIONS: None immediate. Patient did experience some chest pain and shortness of breath during the procedure as well as complaining of buttock pain, possibly due to a sacral decubitus ulcer. She also notes that she has no one at home to stay with her. For these reasons, after the procedure we transferred her to the emergency room for further evaluation prior to discharge. PROCEDURE: Informed written consent was obtained from the patient after a thorough discussion of the procedural risks, benefits and alternatives. All questions were addressed. Maximal Sterile Barrier Technique was utilized including caps, mask, sterile gowns, sterile gloves, sterile drape, hand hygiene and skin antiseptic. A timeout was performed prior to the initiation of the procedure. The left upper arm graft was interrogated with ultrasound and found to be completely thrombosed. An image was obtained and stored for the medical record. Local anesthesia was attained by infiltration with 1% lidocaine. A small dermatotomy was made. Under real-time sonographic guidance, the graft was punctured with a 21 gauge micropuncture needle in antegrade fashion adjacent to the arterial anastomosis. Using standard technique, the initial micro needle was exchanged over a 0.018 micro wire for a transitional 4 [place] micro sheath. 2 mg of tPA was then instilled into the thrombus. A second retrograde access was identified farther up the arm. Local anesthesia was attained by infiltration with 1% lidocaine. A small dermatotomy was made. Under real-time sonographic guidance, the vessel was punctured with a 21 gauge micropuncture needle.  Using standard technique, the initial micro needle was exchanged over a 0.018 micro wire for a transitional 4 [place] micro sheath. Another 2 mg of tPA was instilled into the thrombus from the retrograde access. Attention was again turned to the antegrade access adjacent to the arterial anastomosis. A short Amplatz wire was advanced through the transitional micro sheath in the micro sheath was exchanged for a working 7 [place] vascular sheath. An angled catheter was then advanced over the wire and into the left subclavian vein. A central venogram was performed. There is a a combination of likely in stent stenosis and thrombus within the left innominate and subclavian venous stent system resulting in severe limitation of flow. Nonocclusive thrombus extends from the peripheral margin of the stent system throughout the left subclavian and axillary veins. Pull-back left upper extremity venogram confirms the extent of the thrombus as well as thrombus within the graft. Due to the volume of thrombus, the decision was made to proceed with debulking of the thrombus using the Angiojet. The catheter was navigated through the nearly occluded central stent system using a Bentson wire. The Bentson wire was then exchanged for a Rosen wire which was placed in the superior vena cava. A 6 French 90 cm Angiojet was then advanced over the wire and the segments of vein from the innominate stent to the stent in the mid arm was thrombectomized. Multiple passes of the Angiojet was performed. Follow-up venography demonstrates marked reduction in clot burden. There is only some mild residual InStent stenosis within the stent system. No definite clot identified. The catheter was then  removed. The percutaneous thrombectomy device was then advanced into the graft and the graft was thrombectomized using the device. Attention was then turned to the retrograde graft access. The micro sheath was exchanged over a short Amplatz wire for a working 6  [place] vascular sheath. The percutaneous thrombectomy device was then advanced into the brachial artery and used in basket fashion to pull the arterial plug back into the body of the graft where it was macerated. Aspiration was then performed. Both the antegrade and retrograde sheath demonstrate brisk bleeding consistent with restoration of flow. The angled catheter was then advanced over a Bentson wire into the brachial artery and a brachial arteriogram was performed. This confirms patency of the arterial anastomosis, the graft and the draining vein. There is persistent in stent stenosis versus peripheral adherent thrombus within the left subclavian stent system. Angioplasty was performed using a 10 x 40 mm Conquest balloon. Following angioplasty, there is some reduction of flow secondary to residual thrombus. The balloon was removed. The Angiojet device was reinserted. Additional thrombectomy was performed through the Angiojet within the stent system. Final arteriography demonstrates brisk flow with minimal residual filling defect. There is a palpable thrill at the graft. IMPRESSION: 1. 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 2. Successful pharmacomechanical thrombolysis/thrombectomy procedure using a combination of tPA, Angiojet and the Arrow percutaneous thrombectomy device. 3. Successful angioplasty of in stent stenosis within the left innominate stent system using a 10 x 40 mm Conquest balloon. ACCESS: This access remains amenable to future percutaneous interventions as clinically indicated. Electronically Signed   By: Jacqulynn Cadet M.D.   On: 06/23/2017 17:06   Ir Pta Addl Central Dialysis Seg Thru Dialy Circuit Left  Result Date: 06/23/2017 INDICATION: 84 year old female with end-stage renal disease on hemodialysis via a left upper extremity arteriovenous graft. Her access has clotted, she has not dialyzed since Monday. EXAM:  1. Ultrasound-guided antegrade access 2. Ultrasound-guided retrograde access 3. Central venogram 4. Thrombolysis/thrombectomy declot procedure using tPA, the Angiojet and the Arrow percutaneous thrombectomy device 5. Angioplasty of in stent stenosis within the left innominate vein stent system. MEDICATIONS: 5000 units heparin ANESTHESIA/SEDATION: No sedation. FLUOROSCOPY TIME:  Fluoroscopy Time: 11 minutes 42 seconds (45 mGy). COMPLICATIONS: None immediate. Patient did experience some chest pain and shortness of breath during the procedure as well as complaining of buttock pain, possibly due to a sacral decubitus ulcer. She also notes that she has no one at home to stay with her. For these reasons, after the procedure we transferred her to the emergency room for further evaluation prior to discharge. PROCEDURE: Informed written consent was obtained from the patient after a thorough discussion of the procedural risks, benefits and alternatives. All questions were addressed. Maximal Sterile Barrier Technique was utilized including caps, mask, sterile gowns, sterile gloves, sterile drape, hand hygiene and skin antiseptic. A timeout was performed prior to the initiation of the procedure. The left upper arm graft was interrogated with ultrasound and found to be completely thrombosed. An image was obtained and stored for the medical record. Local anesthesia was attained by infiltration with 1% lidocaine. A small dermatotomy was made. Under real-time sonographic guidance, the graft was punctured with a 21 gauge micropuncture needle in antegrade fashion adjacent to the arterial anastomosis. Using standard technique, the initial micro needle was exchanged over a 0.018 micro wire for a transitional 4 [place] micro sheath. 2 mg of tPA was then instilled into the thrombus. A second retrograde  access was identified farther up the arm. Local anesthesia was attained by infiltration with 1% lidocaine. A small dermatotomy was made.  Under real-time sonographic guidance, the vessel was punctured with a 21 gauge micropuncture needle. Using standard technique, the initial micro needle was exchanged over a 0.018 micro wire for a transitional 4 [place] micro sheath. Another 2 mg of tPA was instilled into the thrombus from the retrograde access. Attention was again turned to the antegrade access adjacent to the arterial anastomosis. A short Amplatz wire was advanced through the transitional micro sheath in the micro sheath was exchanged for a working 7 [place] vascular sheath. An angled catheter was then advanced over the wire and into the left subclavian vein. A central venogram was performed. There is a a combination of likely in stent stenosis and thrombus within the left innominate and subclavian venous stent system resulting in severe limitation of flow. Nonocclusive thrombus extends from the peripheral margin of the stent system throughout the left subclavian and axillary veins. Pull-back left upper extremity venogram confirms the extent of the thrombus as well as thrombus within the graft. Due to the volume of thrombus, the decision was made to proceed with debulking of the thrombus using the Angiojet. The catheter was navigated through the nearly occluded central stent system using a Bentson wire. The Bentson wire was then exchanged for a Rosen wire which was placed in the superior vena cava. A 6 French 90 cm Angiojet was then advanced over the wire and the segments of vein from the innominate stent to the stent in the mid arm was thrombectomized. Multiple passes of the Angiojet was performed. Follow-up venography demonstrates marked reduction in clot burden. There is only some mild residual InStent stenosis within the stent system. No definite clot identified. The catheter was then removed. The percutaneous thrombectomy device was then advanced into the graft and the graft was thrombectomized using the device. Attention was then turned to the  retrograde graft access. The micro sheath was exchanged over a short Amplatz wire for a working 6 [place] vascular sheath. The percutaneous thrombectomy device was then advanced into the brachial artery and used in basket fashion to pull the arterial plug back into the body of the graft where it was macerated. Aspiration was then performed. Both the antegrade and retrograde sheath demonstrate brisk bleeding consistent with restoration of flow. The angled catheter was then advanced over a Bentson wire into the brachial artery and a brachial arteriogram was performed. This confirms patency of the arterial anastomosis, the graft and the draining vein. There is persistent in stent stenosis versus peripheral adherent thrombus within the left subclavian stent system. Angioplasty was performed using a 10 x 40 mm Conquest balloon. Following angioplasty, there is some reduction of flow secondary to residual thrombus. The balloon was removed. The Angiojet device was reinserted. Additional thrombectomy was performed through the Angiojet within the stent system. Final arteriography demonstrates brisk flow with minimal residual filling defect. There is a palpable thrill at the graft. IMPRESSION: 1. 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 2. Successful pharmacomechanical thrombolysis/thrombectomy procedure using a combination of tPA, Angiojet and the Arrow percutaneous thrombectomy device. 3. Successful angioplasty of in stent stenosis within the left innominate stent system using a 10 x 40 mm Conquest balloon. ACCESS: This access remains amenable to future percutaneous interventions as clinically indicated. Electronically Signed   By: Jacqulynn Cadet M.D.   On: 06/23/2017 17:06    Dialysis  Orders:  [facility] 3.75h 160 NRe 350/600 EDW 43kg 3K/2Ca Profile 4 LUE AVG Hep 1700 Venofer 50mg  IV q week Mircera 13mcg IV q 2 weeks (last 4/3) Calcitriol  1.38mcg PO  TIW BMM: Fosrenol 1000mg  qac   Assessment/Plan: 1. NSTEMI/Elevated tropoinins - per cards/primary. Planning LHC today  2. Clotted AVF- s/p IR thrombectomy/angioplasty 4/18  3. ESRD -  MWF. For HD today on schedule. May need serial HD for volume removal  4. Hypertension/volume  - Significant volume excess on exam/Plan serial HD for volume 5. Anemia  - Hgb >11. Follow trends. Redose ESA as needed  6. Metabolic bone disease - Follow labs. Continue VDRA 7. PCM- Malnourished. Liberalize diet when resumed/Protein supplements 8. Debility/QOL- very weak/debiltiated with questionable support at home. Patient is extremely sick w/ anasarca, severe debility and FTT.  She is markedly vol overloaded prob reflecting progressive wt loss.  We will attempt to do serial dialysis on her and get some of this volume off, not sure how she will tolerate however.  Not sure that she will remain able to safely do dialysis unless something reversible is found to improve her debility.    Lynnda Child PA-C [facility] Pager [phone] 06/24/2017, 11:13 AM   Pt seen, examined, agree w assess/plan as above with additions as indicated.  Kelly Splinter MD [employer] pager [phone]    cell [phone] 06/24/2017, 2:53 PM

## 2017-06-24 NOTE — Procedures (Signed)
   I was present at this dialysis session, have reviewed the session itself and made  appropriate changes Kelly Splinter MD [facility] pager [phone]   06/24/2017, 4:38 PM

## 2017-06-24 NOTE — Progress Notes (Signed)
Pt's o2 difficult to track per ED RN- this RN able to see it with pulse ox on ear lobe- low 90's. Pt stated she would feel better with some oxygen. 2L add. Will continue to monitor

## 2017-06-25 DIAGNOSIS — Z515 Encounter for palliative care: Secondary | ICD-10-CM

## 2017-06-25 LAB — GLUCOSE, CAPILLARY: GLUCOSE-CAPILLARY: 16 mg/dL — AB (ref 65–99)

## 2017-06-25 LAB — PROCALCITONIN: PROCALCITONIN: 2.64 ng/mL

## 2017-06-25 LAB — BASIC METABOLIC PANEL
Anion gap: 15 (ref 5–15)
BUN: 20 mg/dL (ref 6–20)
CO2: 22 mmol/L (ref 22–32)
Calcium: 9.3 mg/dL (ref 8.9–10.3)
Chloride: 100 mmol/L — ABNORMAL LOW (ref 101–111)
Creatinine, Ser: 2.44 mg/dL — ABNORMAL HIGH (ref 0.44–1.00)
GFR calc Af Amer: 20 mL/min — ABNORMAL LOW (ref >60)
GFR, EST NON AFRICAN AMERICAN: 17 mL/min — AB (ref >60)
GLUCOSE: 46 mg/dL — AB (ref 65–99)
POTASSIUM: 4.2 mmol/L (ref 3.5–5.1)
Sodium: 137 mmol/L (ref 135–145)

## 2017-06-25 LAB — LIPID PANEL
CHOLESTEROL: 140 mg/dL (ref 0–200)
HDL: 55 mg/dL (ref >40)
LDL Cholesterol: 76 mg/dL (ref 0–99)
TRIGLYCERIDES: 44 mg/dL (ref <150)
Total CHOL/HDL Ratio: 2.5 RATIO
VLDL: 9 mg/dL (ref 0–40)

## 2017-06-25 LAB — CBC
HCT: 30.8 % — ABNORMAL LOW (ref 36.0–46.0)
Hemoglobin: 10 g/dL — ABNORMAL LOW (ref 12.0–15.0)
MCH: 30 pg (ref 26.0–34.0)
MCHC: 32.5 g/dL (ref 30.0–36.0)
MCV: 92.5 fL (ref 78.0–100.0)
PLATELETS: 77 10*3/uL — AB (ref 150–400)
RBC: 3.33 MIL/uL — AB (ref 3.87–5.11)
RDW: 15.2 % (ref 11.5–15.5)
WBC: 5.1 10*3/uL (ref 4.0–10.5)

## 2017-06-25 LAB — HEPATITIS B SURFACE ANTIGEN: HEP B S AG: NEGATIVE

## 2017-06-25 LAB — HEPARIN LEVEL (UNFRACTIONATED): HEPARIN UNFRACTIONATED: 0.35 [IU]/mL (ref 0.30–0.70)

## 2017-06-25 LAB — PROTIME-INR
INR: 1.45
Prothrombin Time: 17.5 seconds — ABNORMAL HIGH (ref 11.4–15.2)

## 2017-06-25 LAB — TROPONIN I: TROPONIN I: 9.01 ng/mL — AB (ref <0.03)

## 2017-06-25 NOTE — Plan of Care (Signed)
  Problem: Nutrition: Goal: Adequate nutrition will be maintained Outcome: Not Progressing  Pt eating approximately 10% of meal.

## 2017-06-25 NOTE — Progress Notes (Addendum)
[facility] Progress Note  Subjective: no new c/o's, very tired and weak  Vitals:   06/24/17 2351 06/25/17 0010 06/25/17 0449 06/25/17 0742  BP:  (!) 84/54 (!) 103/43 (!) 100/40  Pulse: 70 (!) 58 (!) 58 (!) 57  Resp:  16 16   Temp:  (!) 97.4 F (36.3 C) 97.6 F (36.4 C) 97.6 F (36.4 C)  TempSrc:  Oral Oral Oral  SpO2:      Weight:   23.1 kg (51 lb)   Height:        Inpatient medications: . anastrozole  1 mg Oral Daily  . aspirin  81 mg Oral Daily  . atorvastatin  80 mg Oral q1800  . calcitRIOL  1.5 mcg Oral Q M,W,F-HD  . cinacalcet  30 mg Oral Q breakfast  . collagenase   Topical Daily  . feeding supplement (NEPRO CARB STEADY)  237 mL Oral TID BM  . feeding supplement (PRO-STAT SUGAR FREE 64)  30 mL Oral BID  . lanthanum  1,000 mg Oral QPC supper  . lidocaine  1 patch Transdermal Q24H  . metoprolol succinate  25 mg Oral QHS  . multivitamin  1 tablet Oral QPC supper  . nutrition supplement (JUVEN)  1 packet Oral BID BM  . sodium chloride flush  3 mL Intravenous Q12H   . sodium chloride    . sodium chloride    . sodium chloride    . sodium chloride    . heparin 700 Units/hr (06/25/17 0630)   sodium chloride, sodium chloride, sodium chloride, acetaminophen, ALPRAZolam, heparin, heparin, hydrALAZINE, lidocaine (PF), lidocaine-prilocaine, midodrine, morphine injection, nitroGLYCERIN, ondansetron (ZOFRAN) IV, oxyCODONE-acetaminophen, pentafluoroprop-tetrafluoroeth, sodium chloride flush, zolpidem  Exam: General: Frail chronically illl appearing elderly female lying in bed  Head: NCAT sclera not icteric MMM Neck: Supple. No JVD No masses Lungs: Diminished bilaterally  Heart: RRR with S1 S2 Abdomen: soft NT + BS Lower extremities: 2-3+ pitting edema bilaterally from stumps to flanks; bilat flank/ UE edema also Neuro: A & O  X 3. Severely weak x 4 ext Psych:  Responds to questions appropriately with a normal affect. Dialysis Access: LUE AVG bandaged +bruit     [facility] 3h 31min   350/600   43kg   3K/2Ca bath P4  LUE AVG  Hep 1700 Venofer 50mg  IV q week Mircera 80mcg IV q 2 weeks (last 4/3) Calcitriol 1.43mcg PO  TIW BMM: Fosrenol 1000mg  qac       Impression: 1  Acute MI / NSTEMI/ ^troponins - not candidate for intervention 2  SP AVF declot - by IR on 4/18 3  ESRD - HD MWF.  Had HD yesterday, BP's dropped and only 1 L UF removed. 4  Anasarca /  FTT - severe vol overload 5  Hypotension - on HD, precludes sig fluid removal 6  FTT - is severely debilitated, declining health 7  Hep C   Plan -  Pt has multiple comorbidities including acute MI, not candidate for intervention. Also severely debilitated.  Continued HD is unsafe due to severe untreatable comorbidities. Have d/w primary MD, recommend consult for palliative care/ hospice care.      Kelly Splinter MD [facility] pager [phone]   06/25/2017, 11:03 AM   Recent Labs  Lab 06/23/17 1535 06/23/17 1544 06/24/17 0939  NA 101* 139 137  K 3.6 3.4* 3.5  CL 87* 98* 102  CO2  --   --  24  GLUCOSE 38* 67 102*  BUN  34* 27* 38*  CREATININE 2.50* 3.20* 3.77*  CALCIUM  --   --  9.4   Recent Labs  Lab 06/24/17 1011  ALBUMIN 3.1*   Recent Labs  Lab 06/23/17 1544 06/24/17 1046 06/25/17 1029  WBC  --  4.6 5.1  NEUTROABS  --  3.7  --   HGB 11.6* 9.4* 10.0*  HCT 34.0* 27.9* 30.8*  MCV  --  90.9 92.5  PLT  --  91* 77*   Iron/TIBC/Ferritin/ %Sat No results found for: IRON, TIBC, FERRITIN, IRONPCTSAT

## 2017-06-25 NOTE — Plan of Care (Signed)
  Problem: Nutrition: Goal: Adequate nutrition will be maintained Outcome: Not Progressing  Pt endorses poor appetite. Consumes less than 25% of meals

## 2017-06-25 NOTE — Progress Notes (Signed)
Per pharmacy discussion over the phone, Pt;s heparin level therapeutic this morning and no need to draw this afternoon. Collection cancelled. Elita Boone BSN, RN

## 2017-06-25 NOTE — Progress Notes (Signed)
Lab attempted failed lab draw X3. On Call [place] notified. On-call also notified of sustained low BP. Instructed by Kennon Holter to hold metoprolol.

## 2017-06-25 NOTE — Progress Notes (Signed)
ANTICOAGULATION CONSULT NOTE  Pharmacy Consult for heparin Indication: chest pain/ACS  Heparin Dosing Weight: ~35kg  Labs: Recent Labs    06/23/17 1544  06/24/17 0701 06/24/17 0939 06/24/17 0940 06/24/17 1046 06/25/17 1029  HGB 11.6*  --   --   --   --  9.4* 10.0*  HCT 34.0*  --   --   --   --  27.9* 30.8*  PLT  --   --   --   --   --  91* 77*  HEPARINUNFRC  --   --   --   --  0.18*  --  0.35  CREATININE 3.20*  --   --  3.77*  --   --  2.44*  TROPONINI  --    < > 18.94* 30.29*  --   --  9.01*   < > = values in this interval not displayed.    Assessment: 29 yof with hx of bilateral AKA, ESRD on HD presenting with CP during thrombectomy of LUE HD graft. Pharmacy consulted to dose heparin for ACS. Not on anticoagulation PTA.  Difficult stick, had to wait for pt to go for HD to get labs, heparin level therapeutic at 0.35 on 700 units/hr.  H/H low but improving, no bleeding reported, plts are getting borderline.    Goal of Therapy:  Heparin level 0.3-0.7 units/ml Monitor platelets by anticoagulation protocol: Yes   Plan:  Continue heparin gtt at 700 units/hr F/u cards plan for ending gtt at 72h Monitor daily heparin level, CBC, s/s bleeding  Bertis Ruddy, PharmD Pharmacy Resident Pager #: [phone] 06/25/2017 2:44 PM

## 2017-06-25 NOTE — Progress Notes (Signed)
Patient ID: [name], female   DOB: 09-07-1932, 84 y.o.   MRN: [id number]  Case discussed with Drs Waldron Labs and Jonnie Finner. Patient is felt to be no longer a safe dialysis candidate.   I met again with patient in the presence of [name], her HCPOA. I updated both on the status of dialysis per nephrology. Both seemed accepting of stopping dialysis. We talked at length about comfort measures, end of life care, and transfer to residential hospice. Patient is familiar with [facility], who provided end of life care for her husband. Both patient and HCPOA felt like [place] would be their preferred location. However, I will consult SW to coordinate discharge.   Plan: 1. Comfort Care 2. SW to coordinate transfer to [facility]  Time: 60 minutes

## 2017-06-25 NOTE — Consult Note (Signed)
Consultation Note Date: 06/25/2017   Patient Name: [name]  DOB: 20-Oct-1932  MRN: [id number]  Age / Sex: 84 y.o., female  PCP: Heather Parish, MD Referring Physician: Albertine Patricia, MD  Reason for Consultation: Establishing goals of care  HPI/Patient Profile: 84 y.o. female  with past medical history of ESRD on HD (MWF), DM, PVD s/p B. BKA, HTN, HCV, h/o breast cancer, and recent AVG clot s/p angioplasty (06/22/17), who was admitted on 06/23/2017 with NSTEMI, which was conservatively managed given her age, frailty and co-morbidities. Patient was dialyzed yesterday but apparently she was unable to have much fluid removed due to hypotension. Nephrology feels continued dialysis is likely unsafe. Palliative care has been consulted to help clarify goals.   Clinical Assessment and Goals of Care: I met with patient to clarify goals and then called her cousin, [name], who is patient's HCPOA.    Patient says that she lives at home and is cared for by her two cousins, who help her with ADLs. Her husband died seven years ago at hospice and patient has no children. She says she has been receiving dialysis for many years and has never thought about stopping. She is able to tell me that the dialysis session yesterday was unsuccessful given her low blood pressure. We talked about future dialysis sessions and she indicated that she would like to try if possible. However, patient speaks rather candidly about her mortality and says that she is not afraid to die and that she is ok when it is her time. We talked about hospice in the event that dialysis is stopped. Her husband died at [facility] and she is familiar with hospice care. We talked about code status and patient clearly stated that she would not want to be resuscitated.   I called and spoke with patient's cousin, [name], who is also her HCPOA. I updated her on  patient's medical status. She says that patient has had more pain during outpatient dialysis. We talked about the problem with dialyzing her due to hypotension and that patient could be approaching end of life. We also talked about possible hospice involvement.   SUMMARY OF RECOMMENDATIONS   1. DNR 2. Patient had requested continued dialysis if she tolerates. In the event that dialysis is not appropriate, would recommend hospice involvement.  3. Will follow.      Primary Diagnoses: Present on Admission: . Hypertension . Dyslipidemia . NSTEMI (non-ST elevated myocardial infarction) ([place]) . Hypokalemia . Breast cancer of lower-outer quadrant of right female breast ([place]) . Protein-calorie malnutrition, severe . Pressure ulcer   I have reviewed the medical record, interviewed the patient and family, and examined the patient. The following aspects are pertinent.  Past Medical History:  Diagnosis Date  . Anemia   . Arthritis   . Bilateral breast cancer ([place])   . DM (diabetes mellitus) ([place])   . Dyslipidemia   . ESRD (end stage renal disease) on dialysis ([place])    a. [place]: MWF (07/10/2014)  . Hepatitis C   .  Hyperparathyroidism   . Hypertension   . PVD (peripheral vascular disease) ([place])    a. s/p peripheral angiogram on 07/10/14 with no PCI and cont med Rx  . Vascular disease    Social History   Socioeconomic History  . Marital status: Widowed    Spouse name: Not on file  . Number of children: Not on file  . Years of education: Not on file  . Highest education level: Not on file  Occupational History  . Not on file  Social Needs  . Financial resource strain: Not on file  . Food insecurity:    Worry: Not on file    Inability: Not on file  . Transportation needs:    Medical: Not on file    Non-medical: Not on file  Tobacco Use  . Smoking status: Former Smoker    Types: Cigarettes  . Smokeless tobacco: Never Used  . Tobacco comment: "stopped smoking in 1969"    Substance and Sexual Activity  . Alcohol use: Not Currently    Alcohol/week: 0.0 oz    Frequency: Never    Comment: "no alcohol since 1969"  . Drug use: No  . Sexual activity: Never  Lifestyle  . Physical activity:    Days per week: Not on file    Minutes per session: Not on file  . Stress: Not on file  Relationships  . Social connections:    Talks on phone: Not on file    Gets together: Not on file    Attends religious service: Not on file    Active member of club or organization: Not on file    Attends meetings of clubs or organizations: Not on file    Relationship status: Not on file  Other Topics Concern  . Not on file  Social History Narrative  . Not on file   Family History  Problem Relation Age of Onset  . Cancer Mother   . Hypertension Mother    Scheduled Meds: . anastrozole  1 mg Oral Daily  . aspirin  81 mg Oral Daily  . atorvastatin  80 mg Oral q1800  . calcitRIOL  1.5 mcg Oral Q M,W,F-HD  . cinacalcet  30 mg Oral Q breakfast  . collagenase   Topical Daily  . feeding supplement (NEPRO CARB STEADY)  237 mL Oral TID BM  . feeding supplement (PRO-STAT SUGAR FREE 64)  30 mL Oral BID  . lanthanum  1,000 mg Oral QPC supper  . lidocaine  1 patch Transdermal Q24H  . metoprolol succinate  25 mg Oral QHS  . multivitamin  1 tablet Oral QPC supper  . nutrition supplement (JUVEN)  1 packet Oral BID BM  . sodium chloride flush  3 mL Intravenous Q12H   Continuous Infusions: . sodium chloride    . sodium chloride    . heparin 700 Units/hr (06/25/17 0630)   PRN Meds:.sodium chloride, acetaminophen, ALPRAZolam, hydrALAZINE, midodrine, morphine injection, nitroGLYCERIN, ondansetron (ZOFRAN) IV, oxyCODONE-acetaminophen, sodium chloride flush, zolpidem Medications Prior to Admission:  Prior to Admission medications   Medication Sig Start Date End Date Taking? Authorizing Provider  anastrozole (ARIMIDEX) 1 MG tablet TAKE 1 TABLET BY MOUTH DAILY 06/01/17  Yes Shadad, Mathis Dad, MD  B Complex-C-Folic Acid (DIALYVITE TABLET) TABS Take 1 tablet by mouth daily after supper. 10/31/15  Yes [provider]  cinacalcet (SENSIPAR) 30 MG tablet Take 2 tablets (60 mg total) by mouth every Monday, Wednesday, and Friday with hemodialysis. Patient taking differently: Take 30 mg  by mouth daily.  08/27/16  Yes Theodis Blaze, MD  collagenase (SANTYL) ointment Apply topically daily. Patient taking differently: Apply 1 application topically daily.  08/26/16  Yes Theodis Blaze, MD  ENSURE (ENSURE) Take 237 mLs by mouth 2 (two) times daily between meals.   Yes [provider]  lanthanum (FOSRENOL) 1000 MG chewable tablet Chew 1,000 mg by mouth daily after supper.    Yes [provider]  lidocaine (LIDODERM) 5 % Place 1 patch onto the skin daily. Remove & Discard patch within 12 hours or as directed by MD 05/30/17  Yes Leaphart, Zack Seal, PA-C  metoprolol succinate (TOPROL XL) 25 MG 24 hr tablet Take 0.5 tablets (12.5 mg total) by mouth daily. Patient taking differently: Take 25 mg by mouth at bedtime.  08/27/16  Yes Theodis Blaze, MD  oxyCODONE-acetaminophen (PERCOCET) 5-325 MG tablet Take 1 tablet by mouth every 6 (six) hours as needed for severe pain. 05/30/17  Yes Little, Wenda Overland, MD  Amino Acids-Protein Hydrolys (FEEDING SUPPLEMENT, PRO-STAT SUGAR FREE 64,) LIQD Take 30 mLs by mouth 2 (two) times daily. Patient not taking: Reported on 03/21/2017 08/26/16   Theodis Blaze, MD  calcitRIOL (ROCALTROL) 0.25 MCG capsule Take 3 capsules (0.75 mcg total) by mouth every Monday, Wednesday, and Friday with hemodialysis. Patient not taking: Reported on 06/23/2017 08/27/16   Theodis Blaze, MD  Nutritional Supplements (FEEDING SUPPLEMENT, NEPRO CARB STEADY,) LIQD Take 237 mLs by mouth 2 (two) times daily between meals. Patient not taking: Reported on 03/21/2017 11/24/15   Elgergawy, Silver Huguenin, MD  oxyCODONE (OXY IR/ROXICODONE) 5 MG immediate release tablet Take 1 tablet (5  mg total) by mouth every 4 (four) hours as needed for moderate pain. Patient not taking: Reported on 03/21/2017 08/26/16   Theodis Blaze, MD   Allergies  Allergen Reactions  . Aspirin Other (See Comments)    NO BLOOD THINNERS OF ANY KIND-bleeding events   Review of Systems  All other systems reviewed and are negative.   Physical Exam  Constitutional: She is oriented to person, place, and time.  Thin, frail appearing  Cardiovascular: Normal rate and regular rhythm.  Musculoskeletal:  B. AKA  Neurological: She is alert and oriented to person, place, and time.  Psychiatric: She has a normal mood and affect. Her behavior is normal.    Vital Signs: BP (!) 93/49 (BP Location: Right Arm)   Pulse (!) 59   Temp 97.6 F (36.4 C) (Oral)   Resp 16   Ht 5' 3"  (1.6 m)   Wt 23.1 kg (51 lb) Comment: Low bed, removed all but 1 sheet, 1 pillow  SpO2 (!) 2%   BMI 9.03 kg/m  Pain Scale: 0-10   Pain Score: 0-No pain   SpO2: SpO2: (!) 2 % O2 Device:SpO2: (!) 2 % O2 Flow Rate: .O2 Flow Rate (L/min): 2 L/min  IO: Intake/output summary:   Intake/Output Summary (Last 24 hours) at 06/25/2017 1442 Last data filed at 06/25/2017 1130 Gross per 24 hour  Intake 761.54 ml  Output 1100 ml  Net -338.46 ml    LBM: Last BM Date: 06/24/17 Baseline Weight: Weight: 53.5 kg (118 lb) Most recent weight: Weight: 23.1 kg (51 lb)(Low bed, removed all but 1 sheet, 1 pillow)     Palliative Assessment/Data:   Flowsheet Rows     Most Recent Value  Intake Tab  Referral Department  Hospitalist  Date Notified  06/25/17  Palliative Care Type  New Palliative care  Reason for referral  Clarify Goals of Care  Date of Admission  06/23/17  Date first seen by Palliative Care  06/25/17  # of days Palliative referral response time  0 Day(s)  # of days IP prior to Palliative referral  2  Clinical Assessment  Palliative Performance Scale Score  30%  Psychosocial & Spiritual Assessment  Palliative Care Outcomes    Patient/Family meeting held?  Yes  Who was at the meeting?  patient, then called HCPOA      Time In: 1400 Time Out: 1500 Time Total: 60 minutes Greater than 50%  of this time was spent counseling and coordinating care related to the above assessment and plan.  Signed by: Irean Hong, NP   Please contact Palliative Medicine Team phone at [phone] for questions and concerns.  For individual provider: See Shea Evans

## 2017-06-25 NOTE — Progress Notes (Signed)
PROGRESS NOTE    [name]   AOZ:[id number]  DOB: Jan 05, 1933  DOA: 06/23/2017 PCP: [name], MD   Brief Narrative:  [name] is a 84 y.o. female with ESRD on HD , PAD with b/l AKA,  HTN, Hep C,  breast Ca who is a very erratic historian and so most of the history is obtained from the chart.   She tells me she has not had dialysis since Monday due to issues with her access. Per chart, she presented for declotting of her left arm AVG yesterday by SCAT and was noted to be drowsy. CBG was 53 (she is not on any medication for her diabetes). She was given treatment for her sugar and declotted. She subsequently had dyspnea and maybe chest pain and was sent to the ED. Admitted to [facility] Troponin 2.20. Cardiology saw her in the evening. Started on Heparin infusion with plans for cath the next day.   Troponin 18 today, CBGs registering sugars < 10. Not able to obtain enough blood for labs. Cannot place central line due to ongoing Heparin infusion. Cardiology does not plan on cath. We will obtain blood for labs on dialysis today and tomorrow.  Subjective: No chest pain and currently no dyspnea. No fever or chills overnight  Assessment & Plan:    STEMI (non-ST elevated myocardial infarction)  - cardiology input greatly appreciated, troponins peaked at30.2, baseline EKG with left bundle branch block, a 2-D echo showing EF 55-60%, patient is not a candidate for intervention, plan for medical therapy, continue with aspirin, beta blockers and Lipitor, on heparin GTT.  ESRD/  Hypertension/ acute hypoxic resp failure due to fluid overload - Management per renal, patient is extremely frail, hypotensive with dialysis, no significant amount of fluids could be removed with hemodialysis, she is a very poor candidate for dialysis, he is a very poor candidate/high risk for dialysis , palliative care has been consulted.   Hypoglycemia - last A1c in computer was 5.2 in 2017    -Patient withvery low CBGs/glucose level during these episodes, but she is asymptomatic awake alert and talkative duhese episodes,     Breast cancer of lower-outer quadrant of right female breast  - anastrozole   Dyslipidemia - Lipitor     Severe pitting edema -  Nepro, Prostat - check albumin level - Addendum: found to be 3.1- see if edema improves with dialysis    S/P AKA (above knee amputation) bilateral    Sacral decubitus ulcer - cont wound care per RNs  Dicussed with Dr Jonnie Finner.      DVT prophylaxis: Heparin infusion Code Status: Full code Family Communication: she lives with her niece who is her POA Disposition Plan: critically ill- follow in SDU- if she does not improve with daily dialysis treatments, may need to consult palliative care Consultants:   Cardiology  nephrology Procedures:   Declotting of  AVG 4/18 Antimicrobials:  Anti-infectives (From admission, onward)   None       Objective: Vitals:   06/24/17 2351 06/25/17 0010 06/25/17 0449 06/25/17 0742  BP:  (!) 84/54 (!) 103/43 (!) 100/40  Pulse: 70 (!) 58 (!) 58 (!) 57  Resp:  16 16   Temp:  (!) 97.4 F (36.3 C) 97.6 F (36.4 C) 97.6 F (36.4 C)  TempSrc:  Oral Oral Oral  SpO2:      Weight:   23.1 kg (51 lb)   Height:        Intake/Output Summary (Last 24  hours) at 06/25/2017 1321 Last data filed at 06/25/2017 1111 Gross per 24 hour  Intake 493.04 ml  Output 1100 ml  Net -606.96 ml   Filed Weights   06/24/17 1400 06/24/17 1645 06/25/17 0449  Weight: 33.2 kg (73 lb 2.4 oz) 32.3 kg (71 lb 3.3 oz) 23.1 kg (51 lb)    Examination:  Extremely frail, elderly debilitated female laying in bed in no apparent distress Fair entry bilaterally, no use of accessory muscles, no wheezing S1 & S2 heard, RRR.   Abdomen soft none distended, bowel sounds presentbdomen soft, non-tender, nondistended. Normal bowel sound.   2-3+ edema bilaterally in  stumps and hip area, upper extremity with edema as  well.     Data Reviewed: I have personally reviewed following labs and imaging studies  CBC: Recent Labs  Lab 06/23/17 1535 06/23/17 1544 06/24/17 1046 06/25/17 1029  WBC  --   --  4.6 5.1  NEUTROABS  --   --  3.7  --   HGB 8.8* 11.6* 9.4* 10.0*  HCT 26.0* 34.0* 27.9* 30.8*  MCV  --   --  90.9 92.5  PLT  --   --  91* 77*   Basic Metabolic Panel: Recent Labs  Lab 06/23/17 1535 06/23/17 1544 06/24/17 0939 06/25/17 1029  NA 101* 139 137 137  K 3.6 3.4* 3.5 4.2  CL 87* 98* 102 100*  CO2  --   --  24 22  GLUCOSE 38* 67 102* 46*  BUN 34* 27* 38* 20  CREATININE 2.50* 3.20* 3.77* 2.44*  CALCIUM  --   --  9.4 9.3   GFR: Estimated Creatinine Clearance: 6.5 mL/min (A) (by C-G formula based on SCr of 2.44 mg/dL (H)). Liver Function Tests: Recent Labs  Lab 06/24/17 1011  ALBUMIN 3.1*   No results for input(s): LIPASE, AMYLASE in the last 168 hours. No results for input(s): AMMONIA in the last 168 hours. Coagulation Profile: No results for input(s): INR, PROTIME in the last 168 hours. Cardiac Enzymes: Recent Labs  Lab 06/23/17 1900 06/23/17 2256 06/24/17 0701 06/24/17 0939 06/25/17 1029  TROPONINI 2.20* 2.32* 18.94* 30.29* 9.01*   BNP (last 3 results) No results for input(s): PROBNP in the last 8760 hours. HbA1C: Recent Labs    06/24/17 1030  HGBA1C 3.9*   CBG: Recent Labs  Lab 06/24/17 1155 06/24/17 1158 06/24/17 1800 06/24/17 2028 06/25/17 0736  GLUCAP 21* 39* <10* 29* 16*   Lipid Profile: Recent Labs    06/24/17 1011  CHOL 126  HDL 47  LDLCALC 72  TRIG 33  CHOLHDL 2.7   Thyroid Function Tests: No results for input(s): TSH, T4TOTAL, FREET4, T3FREE, THYROIDAB in the last 72 hours. Anemia Panel: No results for input(s): VITAMINB12, FOLATE, FERRITIN, TIBC, IRON, RETICCTPCT in the last 72 hours. Urine analysis: No results found for: COLORURINE, APPEARANCEUR, LABSPEC, PHURINE, GLUCOSEU, HGBUR, BILIRUBINUR, KETONESUR, PROTEINUR, UROBILINOGEN,  NITRITE, LEUKOCYTESUR Sepsis Labs: @LABRCNTIP (procalcitonin:4,lacticidven:4) ) Recent Results (from the past 240 hour(s))  MRSA PCR Screening     Status: None   Collection Time: 06/24/17 12:46 AM  Result Value Ref Range Status   MRSA by PCR NEGATIVE NEGATIVE Final    Comment:        The GeneXpert MRSA Assay (FDA approved for NASAL specimens only), is one component of a comprehensive MRSA colonization surveillance program. It is not intended to diagnose MRSA infection nor to guide or monitor treatment for MRSA infections. Performed at [facility], [address],  [place] [address]          Radiology Studies: Dg Chest 2 View  Result Date: 06/23/2017 CLINICAL DATA:  Chest pain and shortness of breath which developed during interventional declotting procedure. EXAM: CHEST - 2 VIEW COMPARISON:  Chest radiograph 11/15/2015. FINDINGS: The heart is enlarged. Perihilar and lower lobe opacities are new from priors, along with small BILATERAL effusions. Etiology is uncertain. Pneumonia, pulmonary edema, or atelectasis are considerations. No osseous findings. Vascular stents are seen in the LEFT arm and LEFT central veins. LEFT axillary clips. IMPRESSION: Cardiomegaly with BILATERAL perihilar and lower lobe opacities, uncertain significance. No immediate preprocedure chest is available for correlation, but the findings have worsened since the prior chest radiograph of 2017. Consider CTA chest for further evaluation. Electronically Signed   By: Staci Righter M.D.   On: 06/23/2017 19:07   Ir US Guide Vasc Access Left  Result Date: 06/23/2017 INDICATION: 84 year old female with end-stage renal disease on hemodialysis via a left upper extremity arteriovenous graft. Her access has clotted, she has not dialyzed since Monday. EXAM: 1. Ultrasound-guided antegrade access 2. Ultrasound-guided retrograde access 3. Central venogram 4. Thrombolysis/thrombectomy declot procedure using tPA,  the Angiojet and the Arrow percutaneous thrombectomy device 5. Angioplasty of in stent stenosis within the left innominate vein stent system. MEDICATIONS: 5000 units heparin ANESTHESIA/SEDATION: No sedation. FLUOROSCOPY TIME:  Fluoroscopy Time: 11 minutes 42 seconds (45 mGy). COMPLICATIONS: None immediate. Patient did experience some chest pain and shortness of breath during the procedure as well as complaining of buttock pain, possibly due to a sacral decubitus ulcer. She also notes that she has no one at home to stay with her. For these reasons, after the procedure we transferred her to the emergency room for further evaluation prior to discharge. PROCEDURE: Informed written consent was obtained from the patient after a thorough discussion of the procedural risks, benefits and alternatives. All questions were addressed. Maximal Sterile Barrier Technique was utilized including caps, mask, sterile gowns, sterile gloves, sterile drape, hand hygiene and skin antiseptic. A timeout was performed prior to the initiation of the procedure. The left upper arm graft was interrogated with ultrasound and found to be completely thrombosed. An image was obtained and stored for the medical record. Local anesthesia was attained by infiltration with 1% lidocaine. A small dermatotomy was made. Under real-time sonographic guidance, the graft was punctured with a 21 gauge micropuncture needle in antegrade fashion adjacent to the arterial anastomosis. Using standard technique, the initial micro needle was exchanged over a 0.018 micro wire for a transitional 4 [place] micro sheath. 2 mg of tPA was then instilled into the thrombus. A second retrograde access was identified farther up the arm. Local anesthesia was attained by infiltration with 1% lidocaine. A small dermatotomy was made. Under real-time sonographic guidance, the vessel was punctured with a 21 gauge micropuncture needle. Using standard technique, the initial micro needle was  exchanged over a 0.018 micro wire for a transitional 4 [place] micro sheath. Another 2 mg of tPA was instilled into the thrombus from the retrograde access. Attention was again turned to the antegrade access adjacent to the arterial anastomosis. A short Amplatz wire was advanced through the transitional micro sheath in the micro sheath was exchanged for a working 7 [place] vascular sheath. An angled catheter was then advanced over the wire and into the left subclavian vein. A central venogram was performed. There is a a combination of likely in stent stenosis and thrombus within the left innominate and subclavian venous  stent system resulting in severe limitation of flow. Nonocclusive thrombus extends from the peripheral margin of the stent system throughout the left subclavian and axillary veins. Pull-back left upper extremity venogram confirms the extent of the thrombus as well as thrombus within the graft. Due to the volume of thrombus, the decision was made to proceed with debulking of the thrombus using the Angiojet. The catheter was navigated through the nearly occluded central stent system using a Bentson wire. The Bentson wire was then exchanged for a Rosen wire which was placed in the superior vena cava. A 6 French 90 cm Angiojet was then advanced over the wire and the segments of vein from the innominate stent to the stent in the mid arm was thrombectomized. Multiple passes of the Angiojet was performed. Follow-up venography demonstrates marked reduction in clot burden. There is only some mild residual InStent stenosis within the stent system. No definite clot identified. The catheter was then removed. The percutaneous thrombectomy device was then advanced into the graft and the graft was thrombectomized using the device. Attention was then turned to the retrograde graft access. The micro sheath was exchanged over a short Amplatz wire for a working 6 [place] vascular sheath. The percutaneous thrombectomy  device was then advanced into the brachial artery and used in basket fashion to pull the arterial plug back into the body of the graft where it was macerated. Aspiration was then performed. Both the antegrade and retrograde sheath demonstrate brisk bleeding consistent with restoration of flow. The angled catheter was then advanced over a Bentson wire into the brachial artery and a brachial arteriogram was performed. This confirms patency of the arterial anastomosis, the graft and the draining vein. There is persistent in stent stenosis versus peripheral adherent thrombus within the left subclavian stent system. Angioplasty was performed using a 10 x 40 mm Conquest balloon. Following angioplasty, there is some reduction of flow secondary to residual thrombus. The balloon was removed. The Angiojet device was reinserted. Additional thrombectomy was performed through the Angiojet within the stent system. Final arteriography demonstrates brisk flow with minimal residual filling defect. There is a palpable thrill at the graft. IMPRESSION: 1. 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 2. Successful pharmacomechanical thrombolysis/thrombectomy procedure using a combination of tPA, Angiojet and the Arrow percutaneous thrombectomy device. 3. Successful angioplasty of in stent stenosis within the left innominate stent system using a 10 x 40 mm Conquest balloon. ACCESS: This access remains amenable to future percutaneous interventions as clinically indicated. Electronically Signed   By: Jacqulynn Cadet M.D.   On: 06/23/2017 17:06   Ir Thrombectomy Av Fistula W/thrombolysis Inc/shunt/img Left  Result Date: 06/23/2017 INDICATION: 84 year old female with end-stage renal disease on hemodialysis via a left upper extremity arteriovenous graft. Her access has clotted, she has not dialyzed since Monday. EXAM: 1. Ultrasound-guided antegrade access 2.  Ultrasound-guided retrograde access 3. Central venogram 4. Thrombolysis/thrombectomy declot procedure using tPA, the Angiojet and the Arrow percutaneous thrombectomy device 5. Angioplasty of in stent stenosis within the left innominate vein stent system. MEDICATIONS: 5000 units heparin ANESTHESIA/SEDATION: No sedation. FLUOROSCOPY TIME:  Fluoroscopy Time: 11 minutes 42 seconds (45 mGy). COMPLICATIONS: None immediate. Patient did experience some chest pain and shortness of breath during the procedure as well as complaining of buttock pain, possibly due to a sacral decubitus ulcer. She also notes that she has no one at home to stay with her. For these reasons, after the procedure we transferred her to  the emergency room for further evaluation prior to discharge. PROCEDURE: Informed written consent was obtained from the patient after a thorough discussion of the procedural risks, benefits and alternatives. All questions were addressed. Maximal Sterile Barrier Technique was utilized including caps, mask, sterile gowns, sterile gloves, sterile drape, hand hygiene and skin antiseptic. A timeout was performed prior to the initiation of the procedure. The left upper arm graft was interrogated with ultrasound and found to be completely thrombosed. An image was obtained and stored for the medical record. Local anesthesia was attained by infiltration with 1% lidocaine. A small dermatotomy was made. Under real-time sonographic guidance, the graft was punctured with a 21 gauge micropuncture needle in antegrade fashion adjacent to the arterial anastomosis. Using standard technique, the initial micro needle was exchanged over a 0.018 micro wire for a transitional 4 [place] micro sheath. 2 mg of tPA was then instilled into the thrombus. A second retrograde access was identified farther up the arm. Local anesthesia was attained by infiltration with 1% lidocaine. A small dermatotomy was made. Under real-time sonographic guidance, the  vessel was punctured with a 21 gauge micropuncture needle. Using standard technique, the initial micro needle was exchanged over a 0.018 micro wire for a transitional 4 [place] micro sheath. Another 2 mg of tPA was instilled into the thrombus from the retrograde access. Attention was again turned to the antegrade access adjacent to the arterial anastomosis. A short Amplatz wire was advanced through the transitional micro sheath in the micro sheath was exchanged for a working 7 [place] vascular sheath. An angled catheter was then advanced over the wire and into the left subclavian vein. A central venogram was performed. There is a a combination of likely in stent stenosis and thrombus within the left innominate and subclavian venous stent system resulting in severe limitation of flow. Nonocclusive thrombus extends from the peripheral margin of the stent system throughout the left subclavian and axillary veins. Pull-back left upper extremity venogram confirms the extent of the thrombus as well as thrombus within the graft. Due to the volume of thrombus, the decision was made to proceed with debulking of the thrombus using the Angiojet. The catheter was navigated through the nearly occluded central stent system using a Bentson wire. The Bentson wire was then exchanged for a Rosen wire which was placed in the superior vena cava. A 6 French 90 cm Angiojet was then advanced over the wire and the segments of vein from the innominate stent to the stent in the mid arm was thrombectomized. Multiple passes of the Angiojet was performed. Follow-up venography demonstrates marked reduction in clot burden. There is only some mild residual InStent stenosis within the stent system. No definite clot identified. The catheter was then removed. The percutaneous thrombectomy device was then advanced into the graft and the graft was thrombectomized using the device. Attention was then turned to the retrograde graft access. The micro sheath  was exchanged over a short Amplatz wire for a working 6 [place] vascular sheath. The percutaneous thrombectomy device was then advanced into the brachial artery and used in basket fashion to pull the arterial plug back into the body of the graft where it was macerated. Aspiration was then performed. Both the antegrade and retrograde sheath demonstrate brisk bleeding consistent with restoration of flow. The angled catheter was then advanced over a Bentson wire into the brachial artery and a brachial arteriogram was performed. This confirms patency of the arterial anastomosis, the graft and the draining vein. There is persistent  in stent stenosis versus peripheral adherent thrombus within the left subclavian stent system. Angioplasty was performed using a 10 x 40 mm Conquest balloon. Following angioplasty, there is some reduction of flow secondary to residual thrombus. The balloon was removed. The Angiojet device was reinserted. Additional thrombectomy was performed through the Angiojet within the stent system. Final arteriography demonstrates brisk flow with minimal residual filling defect. There is a palpable thrill at the graft. IMPRESSION: 1. 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 2. Successful pharmacomechanical thrombolysis/thrombectomy procedure using a combination of tPA, Angiojet and the Arrow percutaneous thrombectomy device. 3. Successful angioplasty of in stent stenosis within the left innominate stent system using a 10 x 40 mm Conquest balloon. ACCESS: This access remains amenable to future percutaneous interventions as clinically indicated. Electronically Signed   By: Jacqulynn Cadet M.D.   On: 06/23/2017 17:06   Ir Pta Addl Central Dialysis Seg Thru Dialy Circuit Left  Result Date: 06/23/2017 INDICATION: 84 year old female with end-stage renal disease on hemodialysis via a left upper extremity arteriovenous graft. Her access  has clotted, she has not dialyzed since Monday. EXAM: 1. Ultrasound-guided antegrade access 2. Ultrasound-guided retrograde access 3. Central venogram 4. Thrombolysis/thrombectomy declot procedure using tPA, the Angiojet and the Arrow percutaneous thrombectomy device 5. Angioplasty of in stent stenosis within the left innominate vein stent system. MEDICATIONS: 5000 units heparin ANESTHESIA/SEDATION: No sedation. FLUOROSCOPY TIME:  Fluoroscopy Time: 11 minutes 42 seconds (45 mGy). COMPLICATIONS: None immediate. Patient did experience some chest pain and shortness of breath during the procedure as well as complaining of buttock pain, possibly due to a sacral decubitus ulcer. She also notes that she has no one at home to stay with her. For these reasons, after the procedure we transferred her to the emergency room for further evaluation prior to discharge. PROCEDURE: Informed written consent was obtained from the patient after a thorough discussion of the procedural risks, benefits and alternatives. All questions were addressed. Maximal Sterile Barrier Technique was utilized including caps, mask, sterile gowns, sterile gloves, sterile drape, hand hygiene and skin antiseptic. A timeout was performed prior to the initiation of the procedure. The left upper arm graft was interrogated with ultrasound and found to be completely thrombosed. An image was obtained and stored for the medical record. Local anesthesia was attained by infiltration with 1% lidocaine. A small dermatotomy was made. Under real-time sonographic guidance, the graft was punctured with a 21 gauge micropuncture needle in antegrade fashion adjacent to the arterial anastomosis. Using standard technique, the initial micro needle was exchanged over a 0.018 micro wire for a transitional 4 [place] micro sheath. 2 mg of tPA was then instilled into the thrombus. A second retrograde access was identified farther up the arm. Local anesthesia was attained by  infiltration with 1% lidocaine. A small dermatotomy was made. Under real-time sonographic guidance, the vessel was punctured with a 21 gauge micropuncture needle. Using standard technique, the initial micro needle was exchanged over a 0.018 micro wire for a transitional 4 [place] micro sheath. Another 2 mg of tPA was instilled into the thrombus from the retrograde access. Attention was again turned to the antegrade access adjacent to the arterial anastomosis. A short Amplatz wire was advanced through the transitional micro sheath in the micro sheath was exchanged for a working 7 [place] vascular sheath. An angled catheter was then advanced over the wire and into the left subclavian vein. A central venogram was performed. There is a a  combination of likely in stent stenosis and thrombus within the left innominate and subclavian venous stent system resulting in severe limitation of flow. Nonocclusive thrombus extends from the peripheral margin of the stent system throughout the left subclavian and axillary veins. Pull-back left upper extremity venogram confirms the extent of the thrombus as well as thrombus within the graft. Due to the volume of thrombus, the decision was made to proceed with debulking of the thrombus using the Angiojet. The catheter was navigated through the nearly occluded central stent system using a Bentson wire. The Bentson wire was then exchanged for a Rosen wire which was placed in the superior vena cava. A 6 French 90 cm Angiojet was then advanced over the wire and the segments of vein from the innominate stent to the stent in the mid arm was thrombectomized. Multiple passes of the Angiojet was performed. Follow-up venography demonstrates marked reduction in clot burden. There is only some mild residual InStent stenosis within the stent system. No definite clot identified. The catheter was then removed. The percutaneous thrombectomy device was then advanced into the graft and the graft was  thrombectomized using the device. Attention was then turned to the retrograde graft access. The micro sheath was exchanged over a short Amplatz wire for a working 6 [place] vascular sheath. The percutaneous thrombectomy device was then advanced into the brachial artery and used in basket fashion to pull the arterial plug back into the body of the graft where it was macerated. Aspiration was then performed. Both the antegrade and retrograde sheath demonstrate brisk bleeding consistent with restoration of flow. The angled catheter was then advanced over a Bentson wire into the brachial artery and a brachial arteriogram was performed. This confirms patency of the arterial anastomosis, the graft and the draining vein. There is persistent in stent stenosis versus peripheral adherent thrombus within the left subclavian stent system. Angioplasty was performed using a 10 x 40 mm Conquest balloon. Following angioplasty, there is some reduction of flow secondary to residual thrombus. The balloon was removed. The Angiojet device was reinserted. Additional thrombectomy was performed through the Angiojet within the stent system. Final arteriography demonstrates brisk flow with minimal residual filling defect. There is a palpable thrill at the graft. IMPRESSION: 1. 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 2. Successful pharmacomechanical thrombolysis/thrombectomy procedure using a combination of tPA, Angiojet and the Arrow percutaneous thrombectomy device. 3. Successful angioplasty of in stent stenosis within the left innominate stent system using a 10 x 40 mm Conquest balloon. ACCESS: This access remains amenable to future percutaneous interventions as clinically indicated. Electronically Signed   By: Jacqulynn Cadet M.D.   On: 06/23/2017 17:06      Scheduled Meds: . anastrozole  1 mg Oral Daily  . aspirin  81 mg Oral Daily  . atorvastatin  80 mg  Oral q1800  . calcitRIOL  1.5 mcg Oral Q M,W,F-HD  . cinacalcet  30 mg Oral Q breakfast  . collagenase   Topical Daily  . feeding supplement (NEPRO CARB STEADY)  237 mL Oral TID BM  . feeding supplement (PRO-STAT SUGAR FREE 64)  30 mL Oral BID  . lanthanum  1,000 mg Oral QPC supper  . lidocaine  1 patch Transdermal Q24H  . metoprolol succinate  25 mg Oral QHS  . multivitamin  1 tablet Oral QPC supper  . nutrition supplement (JUVEN)  1 packet Oral BID BM  . sodium chloride flush  3 mL  Intravenous Q12H   Continuous Infusions: . sodium chloride    . sodium chloride    . heparin 700 Units/hr (06/25/17 0630)     LOS: 2 days    Time spent in minutes: 25 minutes    Phillips Climes, MD [facility] Pager: [email or web address] Password [email or web address] 06/25/2017, 1:21 PM

## 2017-06-25 NOTE — Progress Notes (Signed)
Progress Note  Patient Name: [name] Date of Encounter: 06/25/2017  Primary Cardiologist: Dr. Kathlyn Sacramento  Subjective   Resting this morning.  Has not eaten breakfast.  No recent chest pain or shortness of breath at rest.  Inpatient Medications    Scheduled Meds: . anastrozole  1 mg Oral Daily  . aspirin  81 mg Oral Daily  . atorvastatin  80 mg Oral q1800  . calcitRIOL  1.5 mcg Oral Q M,W,F-HD  . cinacalcet  30 mg Oral Q breakfast  . collagenase   Topical Daily  . feeding supplement (NEPRO CARB STEADY)  237 mL Oral TID BM  . feeding supplement (PRO-STAT SUGAR FREE 64)  30 mL Oral BID  . lanthanum  1,000 mg Oral QPC supper  . lidocaine  1 patch Transdermal Q24H  . metoprolol succinate  25 mg Oral QHS  . multivitamin  1 tablet Oral QPC supper  . nutrition supplement (JUVEN)  1 packet Oral BID BM  . sodium chloride flush  3 mL Intravenous Q12H   Continuous Infusions: . sodium chloride    . sodium chloride    . sodium chloride    . sodium chloride    . heparin 700 Units/hr (06/25/17 0630)   PRN Meds: sodium chloride, sodium chloride, sodium chloride, acetaminophen, ALPRAZolam, heparin, heparin, hydrALAZINE, lidocaine (PF), lidocaine-prilocaine, midodrine, morphine injection, nitroGLYCERIN, ondansetron (ZOFRAN) IV, oxyCODONE-acetaminophen, pentafluoroprop-tetrafluoroeth, sodium chloride flush, zolpidem   Vital Signs    Vitals:   06/24/17 2351 06/25/17 0010 06/25/17 0449 06/25/17 0742  BP:  (!) 84/54 (!) 103/43 (!) 100/40  Pulse: 70 (!) 58 (!) 58 (!) 57  Resp:  16 16   Temp:  (!) 97.4 F (36.3 C) 97.6 F (36.4 C) 97.6 F (36.4 C)  TempSrc:  Oral Oral Oral  SpO2:      Weight:   51 lb (23.1 kg)   Height:        Intake/Output Summary (Last 24 hours) at 06/25/2017 0900 Last data filed at 06/25/2017 0630 Gross per 24 hour  Intake 427.04 ml  Output 1100 ml  Net -672.96 ml   Filed Weights   06/24/17 1400 06/24/17 1645 06/25/17 0449  Weight: 73 lb 2.4 oz  (33.2 kg) 71 lb 3.3 oz (32.3 kg) 51 lb (23.1 kg)    Telemetry    Sinus rhythm.  Personally reviewed.  ECG    Tracing from 06/24/2017 shows sinus rhythm with left bundle branch block.  Personally reviewed.  Physical Exam   GEN:  Chronically ill-appearing elderly woman.  No acute distress.   Neck: No JVD. Cardiac: RRR, no gallop.  Respiratory: Nonlabored.  Scattered rhonchi. GI: Soft, nontender, bowel sounds present. MS:  Previous bilateral AKA. Neuro:  Nonfocal. Psych: Normal affect. Tangential historian.  Labs    Chemistry Recent Labs  Lab 06/23/17 1535 06/23/17 1544 06/24/17 0939 06/24/17 1011  NA 101* 139 137  --   K 3.6 3.4* 3.5  --   CL 87* 98* 102  --   CO2  --   --  24  --   GLUCOSE 38* 67 102*  --   BUN 34* 27* 38*  --   CREATININE 2.50* 3.20* 3.77*  --   CALCIUM  --   --  9.4  --   ALBUMIN  --   --   --  3.1*  GFRNONAA  --   --  10*  --   GFRAA  --   --  12*  --  ANIONGAP  --   --  11  --      Hematology Recent Labs  Lab 06/23/17 1535 06/23/17 1544 06/24/17 1046  WBC  --   --  4.6  RBC  --   --  3.07*  HGB 8.8* 11.6* 9.4*  HCT 26.0* 34.0* 27.9*  MCV  --   --  90.9  MCH  --   --  30.6  MCHC  --   --  33.7  RDW  --   --  15.0  PLT  --   --  91*    Cardiac Enzymes Recent Labs  Lab 06/23/17 1900 06/23/17 2256 06/24/17 0701 06/24/17 0939  TROPONINI 2.20* 2.32* 18.94* 30.29*   No results for input(s): TROPIPOC in the last 168 hours.   BNP Recent Labs  Lab 06/24/17 1100  BNP >4,500.0*     Radiology    Dg Chest 2 View  Result Date: 06/23/2017 CLINICAL DATA:  Chest pain and shortness of breath which developed during interventional declotting procedure. EXAM: CHEST - 2 VIEW COMPARISON:  Chest radiograph 11/15/2015. FINDINGS: The heart is enlarged. Perihilar and lower lobe opacities are new from priors, along with small BILATERAL effusions. Etiology is uncertain. Pneumonia, pulmonary edema, or atelectasis are considerations. No osseous  findings. Vascular stents are seen in the LEFT arm and LEFT central veins. LEFT axillary clips. IMPRESSION: Cardiomegaly with BILATERAL perihilar and lower lobe opacities, uncertain significance. No immediate preprocedure chest is available for correlation, but the findings have worsened since the prior chest radiograph of 2017. Consider CTA chest for further evaluation. Electronically Signed   By: Staci Righter M.D.   On: 06/23/2017 19:07   Ir US Guide Vasc Access Left  Result Date: 06/23/2017 INDICATION: 84 year old female with end-stage renal disease on hemodialysis via a left upper extremity arteriovenous graft. Her access has clotted, she has not dialyzed since Monday. EXAM: 1. Ultrasound-guided antegrade access 2. Ultrasound-guided retrograde access 3. Central venogram 4. Thrombolysis/thrombectomy declot procedure using tPA, the Angiojet and the Arrow percutaneous thrombectomy device 5. Angioplasty of in stent stenosis within the left innominate vein stent system. MEDICATIONS: 5000 units heparin ANESTHESIA/SEDATION: No sedation. FLUOROSCOPY TIME:  Fluoroscopy Time: 11 minutes 42 seconds (45 mGy). COMPLICATIONS: None immediate. Patient did experience some chest pain and shortness of breath during the procedure as well as complaining of buttock pain, possibly due to a sacral decubitus ulcer. She also notes that she has no one at home to stay with her. For these reasons, after the procedure we transferred her to the emergency room for further evaluation prior to discharge. PROCEDURE: Informed written consent was obtained from the patient after a thorough discussion of the procedural risks, benefits and alternatives. All questions were addressed. Maximal Sterile Barrier Technique was utilized including caps, mask, sterile gowns, sterile gloves, sterile drape, hand hygiene and skin antiseptic. A timeout was performed prior to the initiation of the procedure. The left upper arm graft was interrogated with  ultrasound and found to be completely thrombosed. An image was obtained and stored for the medical record. Local anesthesia was attained by infiltration with 1% lidocaine. A small dermatotomy was made. Under real-time sonographic guidance, the graft was punctured with a 21 gauge micropuncture needle in antegrade fashion adjacent to the arterial anastomosis. Using standard technique, the initial micro needle was exchanged over a 0.018 micro wire for a transitional 4 [place] micro sheath. 2 mg of tPA was then instilled into the thrombus. A second retrograde access was identified farther up  the arm. Local anesthesia was attained by infiltration with 1% lidocaine. A small dermatotomy was made. Under real-time sonographic guidance, the vessel was punctured with a 21 gauge micropuncture needle. Using standard technique, the initial micro needle was exchanged over a 0.018 micro wire for a transitional 4 [place] micro sheath. Another 2 mg of tPA was instilled into the thrombus from the retrograde access. Attention was again turned to the antegrade access adjacent to the arterial anastomosis. A short Amplatz wire was advanced through the transitional micro sheath in the micro sheath was exchanged for a working 7 [place] vascular sheath. An angled catheter was then advanced over the wire and into the left subclavian vein. A central venogram was performed. There is a a combination of likely in stent stenosis and thrombus within the left innominate and subclavian venous stent system resulting in severe limitation of flow. Nonocclusive thrombus extends from the peripheral margin of the stent system throughout the left subclavian and axillary veins. Pull-back left upper extremity venogram confirms the extent of the thrombus as well as thrombus within the graft. Due to the volume of thrombus, the decision was made to proceed with debulking of the thrombus using the Angiojet. The catheter was navigated through the nearly occluded  central stent system using a Bentson wire. The Bentson wire was then exchanged for a Rosen wire which was placed in the superior vena cava. A 6 French 90 cm Angiojet was then advanced over the wire and the segments of vein from the innominate stent to the stent in the mid arm was thrombectomized. Multiple passes of the Angiojet was performed. Follow-up venography demonstrates marked reduction in clot burden. There is only some mild residual InStent stenosis within the stent system. No definite clot identified. The catheter was then removed. The percutaneous thrombectomy device was then advanced into the graft and the graft was thrombectomized using the device. Attention was then turned to the retrograde graft access. The micro sheath was exchanged over a short Amplatz wire for a working 6 [place] vascular sheath. The percutaneous thrombectomy device was then advanced into the brachial artery and used in basket fashion to pull the arterial plug back into the body of the graft where it was macerated. Aspiration was then performed. Both the antegrade and retrograde sheath demonstrate brisk bleeding consistent with restoration of flow. The angled catheter was then advanced over a Bentson wire into the brachial artery and a brachial arteriogram was performed. This confirms patency of the arterial anastomosis, the graft and the draining vein. There is persistent in stent stenosis versus peripheral adherent thrombus within the left subclavian stent system. Angioplasty was performed using a 10 x 40 mm Conquest balloon. Following angioplasty, there is some reduction of flow secondary to residual thrombus. The balloon was removed. The Angiojet device was reinserted. Additional thrombectomy was performed through the Angiojet within the stent system. Final arteriography demonstrates brisk flow with minimal residual filling defect. There is a palpable thrill at the graft. IMPRESSION: 1. Relatively large central thrombus burden  beginning within the left innominate and subclavian venous stent system. 2. Successful pharmacomechanical thrombolysis/thrombectomy procedure using a combination of tPA, Angiojet and the Arrow percutaneous thrombectomy device. 3. Successful angioplasty of in stent stenosis within the left innominate stent system using a 10 x 40 mm Conquest balloon. ACCESS: This access remains amenable to future percutaneous interventions as clinically indicated. Electronically Signed   By: Jacqulynn Cadet M.D.   On: 06/23/2017 17:06   Ir Thrombectomy Av Fistula W/thrombolysis Inc/shunt/img Left  Result Date: 06/23/2017 INDICATION: 84 year old female with end-stage renal disease on hemodialysis via a left upper extremity arteriovenous graft. Her access has clotted, she has not dialyzed since Monday. EXAM: 1. Ultrasound-guided antegrade access 2. Ultrasound-guided retrograde access 3. Central venogram 4. Thrombolysis/thrombectomy declot procedure using tPA, the Angiojet and the Arrow percutaneous thrombectomy device 5. Angioplasty of in stent stenosis within the left innominate vein stent system. MEDICATIONS: 5000 units heparin ANESTHESIA/SEDATION: No sedation. FLUOROSCOPY TIME:  Fluoroscopy Time: 11 minutes 42 seconds (45 mGy). COMPLICATIONS: None immediate. Patient did experience some chest pain and shortness of breath during the procedure as well as complaining of buttock pain, possibly due to a sacral decubitus ulcer. She also notes that she has no one at home to stay with her. For these reasons, after the procedure we transferred her to the emergency room for further evaluation prior to discharge. PROCEDURE: Informed written consent was obtained from the patient after a thorough discussion of the procedural risks, benefits and alternatives. All questions were addressed. Maximal Sterile Barrier Technique was utilized including caps, mask, sterile gowns, sterile gloves, sterile drape, hand hygiene and skin antiseptic. A  timeout was performed prior to the initiation of the procedure. The left upper arm graft was interrogated with ultrasound and found to be completely thrombosed. An image was obtained and stored for the medical record. Local anesthesia was attained by infiltration with 1% lidocaine. A small dermatotomy was made. Under real-time sonographic guidance, the graft was punctured with a 21 gauge micropuncture needle in antegrade fashion adjacent to the arterial anastomosis. Using standard technique, the initial micro needle was exchanged over a 0.018 micro wire for a transitional 4 [place] micro sheath. 2 mg of tPA was then instilled into the thrombus. A second retrograde access was identified farther up the arm. Local anesthesia was attained by infiltration with 1% lidocaine. A small dermatotomy was made. Under real-time sonographic guidance, the vessel was punctured with a 21 gauge micropuncture needle. Using standard technique, the initial micro needle was exchanged over a 0.018 micro wire for a transitional 4 [place] micro sheath. Another 2 mg of tPA was instilled into the thrombus from the retrograde access. Attention was again turned to the antegrade access adjacent to the arterial anastomosis. A short Amplatz wire was advanced through the transitional micro sheath in the micro sheath was exchanged for a working 7 [place] vascular sheath. An angled catheter was then advanced over the wire and into the left subclavian vein. A central venogram was performed. There is a a combination of likely in stent stenosis and thrombus within the left innominate and subclavian venous stent system resulting in severe limitation of flow. Nonocclusive thrombus extends from the peripheral margin of the stent system throughout the left subclavian and axillary veins. Pull-back left upper extremity venogram confirms the extent of the thrombus as well as thrombus within the graft. Due to the volume of thrombus, the decision was made to proceed  with debulking of the thrombus using the Angiojet. The catheter was navigated through the nearly occluded central stent system using a Bentson wire. The Bentson wire was then exchanged for a Rosen wire which was placed in the superior vena cava. A 6 French 90 cm Angiojet was then advanced over the wire and the segments of vein from the innominate stent to the stent in the mid arm was thrombectomized. Multiple passes of the Angiojet was performed. Follow-up venography demonstrates marked reduction in clot burden. There is only some mild residual InStent stenosis within the stent  system. No definite clot identified. The catheter was then removed. The percutaneous thrombectomy device was then advanced into the graft and the graft was thrombectomized using the device. Attention was then turned to the retrograde graft access. The micro sheath was exchanged over a short Amplatz wire for a working 6 [place] vascular sheath. The percutaneous thrombectomy device was then advanced into the brachial artery and used in basket fashion to pull the arterial plug back into the body of the graft where it was macerated. Aspiration was then performed. Both the antegrade and retrograde sheath demonstrate brisk bleeding consistent with restoration of flow. The angled catheter was then advanced over a Bentson wire into the brachial artery and a brachial arteriogram was performed. This confirms patency of the arterial anastomosis, the graft and the draining vein. There is persistent in stent stenosis versus peripheral adherent thrombus within the left subclavian stent system. Angioplasty was performed using a 10 x 40 mm Conquest balloon. Following angioplasty, there is some reduction of flow secondary to residual thrombus. The balloon was removed. The Angiojet device was reinserted. Additional thrombectomy was performed through the Angiojet within the stent system. Final arteriography demonstrates brisk flow with minimal residual filling  defect. There is a palpable thrill at the graft. IMPRESSION: 1. 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 2. Successful pharmacomechanical thrombolysis/thrombectomy procedure using a combination of tPA, Angiojet and the Arrow percutaneous thrombectomy device. 3. Successful angioplasty of in stent stenosis within the left innominate stent system using a 10 x 40 mm Conquest balloon. ACCESS: This access remains amenable to future percutaneous interventions as clinically indicated. Electronically Signed   By: Jacqulynn Cadet M.D.   On: 06/23/2017 17:06   Ir Pta Addl Central Dialysis Seg Thru Dialy Circuit Left  Result Date: 06/23/2017 INDICATION: 84 year old female with end-stage renal disease on hemodialysis via a left upper extremity arteriovenous graft. Her access has clotted, she has not dialyzed since Monday. EXAM: 1. Ultrasound-guided antegrade access 2. Ultrasound-guided retrograde access 3. Central venogram 4. Thrombolysis/thrombectomy declot procedure using tPA, the Angiojet and the Arrow percutaneous thrombectomy device 5. Angioplasty of in stent stenosis within the left innominate vein stent system. MEDICATIONS: 5000 units heparin ANESTHESIA/SEDATION: No sedation. FLUOROSCOPY TIME:  Fluoroscopy Time: 11 minutes 42 seconds (45 mGy). COMPLICATIONS: None immediate. Patient did experience some chest pain and shortness of breath during the procedure as well as complaining of buttock pain, possibly due to a sacral decubitus ulcer. She also notes that she has no one at home to stay with her. For these reasons, after the procedure we transferred her to the emergency room for further evaluation prior to discharge. PROCEDURE: Informed written consent was obtained from the patient after a thorough discussion of the procedural risks, benefits and alternatives. All questions were addressed. Maximal Sterile Barrier Technique was utilized  including caps, mask, sterile gowns, sterile gloves, sterile drape, hand hygiene and skin antiseptic. A timeout was performed prior to the initiation of the procedure. The left upper arm graft was interrogated with ultrasound and found to be completely thrombosed. An image was obtained and stored for the medical record. Local anesthesia was attained by infiltration with 1% lidocaine. A small dermatotomy was made. Under real-time sonographic guidance, the graft was punctured with a 21 gauge micropuncture needle in antegrade fashion adjacent to the arterial anastomosis. Using standard technique, the initial micro needle was exchanged over a 0.018 micro wire for a transitional 4 [place] micro sheath. 2 mg of tPA  was then instilled into the thrombus. A second retrograde access was identified farther up the arm. Local anesthesia was attained by infiltration with 1% lidocaine. A small dermatotomy was made. Under real-time sonographic guidance, the vessel was punctured with a 21 gauge micropuncture needle. Using standard technique, the initial micro needle was exchanged over a 0.018 micro wire for a transitional 4 [place] micro sheath. Another 2 mg of tPA was instilled into the thrombus from the retrograde access. Attention was again turned to the antegrade access adjacent to the arterial anastomosis. A short Amplatz wire was advanced through the transitional micro sheath in the micro sheath was exchanged for a working 7 [place] vascular sheath. An angled catheter was then advanced over the wire and into the left subclavian vein. A central venogram was performed. There is a a combination of likely in stent stenosis and thrombus within the left innominate and subclavian venous stent system resulting in severe limitation of flow. Nonocclusive thrombus extends from the peripheral margin of the stent system throughout the left subclavian and axillary veins. Pull-back left upper extremity venogram confirms the extent of the  thrombus as well as thrombus within the graft. Due to the volume of thrombus, the decision was made to proceed with debulking of the thrombus using the Angiojet. The catheter was navigated through the nearly occluded central stent system using a Bentson wire. The Bentson wire was then exchanged for a Rosen wire which was placed in the superior vena cava. A 6 French 90 cm Angiojet was then advanced over the wire and the segments of vein from the innominate stent to the stent in the mid arm was thrombectomized. Multiple passes of the Angiojet was performed. Follow-up venography demonstrates marked reduction in clot burden. There is only some mild residual InStent stenosis within the stent system. No definite clot identified. The catheter was then removed. The percutaneous thrombectomy device was then advanced into the graft and the graft was thrombectomized using the device. Attention was then turned to the retrograde graft access. The micro sheath was exchanged over a short Amplatz wire for a working 6 [place] vascular sheath. The percutaneous thrombectomy device was then advanced into the brachial artery and used in basket fashion to pull the arterial plug back into the body of the graft where it was macerated. Aspiration was then performed. Both the antegrade and retrograde sheath demonstrate brisk bleeding consistent with restoration of flow. The angled catheter was then advanced over a Bentson wire into the brachial artery and a brachial arteriogram was performed. This confirms patency of the arterial anastomosis, the graft and the draining vein. There is persistent in stent stenosis versus peripheral adherent thrombus within the left subclavian stent system. Angioplasty was performed using a 10 x 40 mm Conquest balloon. Following angioplasty, there is some reduction of flow secondary to residual thrombus. The balloon was removed. The Angiojet device was reinserted. Additional thrombectomy was performed through  the Angiojet within the stent system. Final arteriography demonstrates brisk flow with minimal residual filling defect. There is a palpable thrill at the graft. IMPRESSION: 1. 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 2. Successful pharmacomechanical thrombolysis/thrombectomy procedure using a combination of tPA, Angiojet and the Arrow percutaneous thrombectomy device. 3. Successful angioplasty of in stent stenosis within the left innominate stent system using a 10 x 40 mm Conquest balloon. ACCESS: This access remains amenable to future percutaneous interventions as clinically indicated. Electronically Signed   By: Dellis Filbert.D.  On: 06/23/2017 17:06    Cardiac Studies   Echocardiogram 06/24/2017: Study Conclusions  - Left ventricle: The cavity size was normal. Wall thickness was   normal. Systolic function was normal. The estimated ejection   fraction was in the range of 55% to 60%. Although no diagnostic   regional wall motion abnormality was identified, this possibility   cannot be completely excluded on the basis of this study. The   study is not technically sufficient to allow evaluation of LV   diastolic function. - Ventricular septum: Septal motion showed paradox. These changes   are consistent with intraventricular conduction delay. - Aortic valve: There was trivial regurgitation. - Mitral valve: Calcified annulus. Mildly thickened, mildly   calcified leaflets . - Pericardium, extracardiac: There was a left pleural effusion.  Patient Profile     84 y.o. 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  She presents with NSTEMI in the setting of thrombectomy of left upper extremity fistula.  Assessment & Plan    1.  NSTEMI, troponin up to 30.29.  No active chest pain.  ECG shows a left bundle branch  block which is old.  Echocardiogram reveals LVEF 55 to 60%.  2.  ESRD on hemodialysis.  Being followed by nephrology, had dialysis session yesterday.  3.  Essential hypertension by history.  Blood pressure currently low normal.  4.  Hyperlipidemia, on Lipitor.  I reviewed the chart and recent cardiology notes with plan for medical therapy.  Continue aspirin, Toprol-XL, and Lipitor.  Anticipate heparin infusion for approximately 72 hours presuming no recurrent chest pain.  There is mention in the chart of previous bleeding problems, but I do not see any details.  We will see how she does with low-dose aspirin, not starting Plavix at this point.  Signed, Rozann Lesches, MD  06/25/2017, 9:00 AM

## 2017-06-26 LAB — CBC
HEMATOCRIT: 29.3 % — AB (ref 36.0–46.0)
Hemoglobin: 9.6 g/dL — ABNORMAL LOW (ref 12.0–15.0)
MCH: 30.3 pg (ref 26.0–34.0)
MCHC: 32.8 g/dL (ref 30.0–36.0)
MCV: 92.4 fL (ref 78.0–100.0)
PLATELETS: 111 10*3/uL — AB (ref 150–400)
RBC: 3.17 MIL/uL — ABNORMAL LOW (ref 3.87–5.11)
RDW: 15.1 % (ref 11.5–15.5)
WBC: 4.5 10*3/uL (ref 4.0–10.5)

## 2017-06-26 LAB — HEPARIN LEVEL (UNFRACTIONATED): Heparin Unfractionated: 0.5 IU/mL (ref 0.30–0.70)

## 2017-06-26 MED ORDER — ZOLPIDEM TARTRATE 5 MG PO TABS: 5.0000 mg | ORAL_TABLET | Freq: Every evening | ORAL | 0 refills | Status: AC | PRN

## 2017-06-26 MED ORDER — ASPIRIN 81 MG PO CHEW: 81.0000 mg | CHEWABLE_TABLET | Freq: Every day | ORAL | Status: AC

## 2017-06-26 MED ORDER — ALPRAZOLAM 0.25 MG PO TABS: 0.2500 mg | ORAL_TABLET | Freq: Two times a day (BID) | ORAL | 0 refills | Status: AC | PRN

## 2017-06-26 MED ORDER — ACETAMINOPHEN 325 MG PO TABS: 650.0000 mg | ORAL_TABLET | ORAL | Status: AC | PRN

## 2017-06-26 NOTE — Progress Notes (Signed)
[facility] Progress Note  Subjective: apprecaite pall care team assistance  Vitals:   06/26/17 0106 06/26/17 0428 06/26/17 0800 06/26/17 0830  BP: (!) 105/42 (!) 99/41  (!) 93/34  Pulse: 70 64 65 64  Resp: 15 17 15 15   Temp: 97.6 F (36.4 C) (!) 97.4 F (36.3 C)    TempSrc: Oral Oral    SpO2:   100%   Weight:      Height:        Inpatient medications: . anastrozole  1 mg Oral Daily  . aspirin  81 mg Oral Daily  . calcitRIOL  1.5 mcg Oral Q M,W,F-HD  . cinacalcet  30 mg Oral Q breakfast  . collagenase   Topical Daily  . feeding supplement (NEPRO CARB STEADY)  237 mL Oral TID BM  . feeding supplement (PRO-STAT SUGAR FREE 64)  30 mL Oral BID  . lanthanum  1,000 mg Oral QPC supper  . lidocaine  1 patch Transdermal Q24H  . metoprolol succinate  25 mg Oral QHS  . multivitamin  1 tablet Oral QPC supper  . nutrition supplement (JUVEN)  1 packet Oral BID BM  . sodium chloride flush  3 mL Intravenous Q12H   . sodium chloride    . sodium chloride     sodium chloride, acetaminophen, ALPRAZolam, hydrALAZINE, midodrine, morphine injection, nitroGLYCERIN, ondansetron (ZOFRAN) IV, oxyCODONE-acetaminophen, sodium chloride flush, zolpidem  Exam: General: Frail Head: NCAT sclera not icteric MMM Neck: Supple. No JVD No masses Lungs: Diminished bilaterally  Heart: RRR with S1 S2 Abdomen: soft NT + BS Lower extremities: 2-3+ pitting edema bilaterally from stumps to flanks; bilat flank/ UE edema also Neuro: A & O  X 3. Severely weak x 4 ext Psych:  Responds to questions appropriately with a normal affect. Dialysis Access: LUE AVG bandaged +bruit    [facility]: [facility] M[facility] 3h 75min   350/600   43kg   3K/2Ca bath P4  LUE AVG  Hep 1700 Venofer 50mg  IV q week Mircera 57mcg IV q 2 weeks (last 4/3) Calcitriol 1.36mcg PO  TIW BMM: Fosrenol 1000mg  qac       Impression: 1  Acute MI / NSTEMI/ ^troponins - not candidate for intervention 2  SP AVF declot - by IR on 4/18 3  ESRD  - poor candidate for further HD, unsafe given debility and other comorbidities 4  Anasarca /  FTT - severe vol overload 5  FTT  6  Hep C 7  Dispo - planning dc to hospice facility   Plan -  Will sign off.     Kelly Splinter MD [facility] pager [phone]   06/26/2017, 8:55 AM   Recent Labs  Lab 06/23/17 1544 06/24/17 0939 06/25/17 1029  NA 139 137 137  K 3.4* 3.5 4.2  CL 98* 102 100*  CO2  --  24 22  GLUCOSE 67 102* 46*  BUN 27* 38* 20  CREATININE 3.20* 3.77* 2.44*  CALCIUM  --  9.4 9.3   Recent Labs  Lab 06/24/17 1011  ALBUMIN 3.1*   Recent Labs  Lab 06/24/17 1046 06/25/17 1029 06/26/17 0422  WBC 4.6 5.1 4.5  NEUTROABS 3.7  --   --   HGB 9.4* 10.0* 9.6*  HCT 27.9* 30.8* 29.3*  MCV 90.9 92.5 92.4  PLT 91* 77* 111*   Iron/TIBC/Ferritin/ %Sat No results found for: IRON, TIBC, FERRITIN, IRONPCTSAT

## 2017-06-26 NOTE — Progress Notes (Signed)
Patient remained hemodynamically stable.

## 2017-06-26 NOTE — Progress Notes (Signed)
6E-05 -- [facility]) [place] RN Visit  Received request from [place], [place] for family interest in [place] with request to transfer today. Chart reviewed and received report from Billey Chang, NP with PMT on 06/25/17. Met with patient and niece (HCPOA) [name] to confirm interest and explain services. Patient and family agreeable to transfer today. Caryl Pina, [place] aware. Registration paper work completed. Dr. Tomasa Hosteller to assume care per family request.   Discharge summary faxed to [phone] at 1058 am.  RN please call report to [phone].  PTAR called at 1100.  Thank you,  Margaretmary Eddy, RN, [facility] Liaison [phone]  [place] are on AMION.

## 2017-06-26 NOTE — Progress Notes (Signed)
[place] for heparin Indication: chest pain/ACS  Heparin Dosing Weight: ~35kg  Labs: Recent Labs    06/23/17 1544  06/24/17 0701 06/24/17 0939 06/24/17 0940 06/24/17 1046 06/25/17 1029 06/26/17 0422  HGB 11.6*  --   --   --   --  9.4* 10.0* 9.6*  HCT 34.0*  --   --   --   --  27.9* 30.8* 29.3*  PLT  --   --   --   --   --  91* 77* 111*  LABPROT  --   --   --   --   --   --  17.5*  --   INR  --   --   --   --   --   --  1.45  --   HEPARINUNFRC  --   --   --   --  0.18*  --  0.35 0.50  CREATININE 3.20*  --   --  3.77*  --   --  2.44*  --   TROPONINI  --    < > 18.94* 30.29*  --   --  9.01*  --    < > = values in this interval not displayed.    Assessment: 85 yof with hx of bilateral AKA, ESRD on HD presenting with CP during thrombectomy of LUE HD graft. Pharmacy consulted to dose heparin for ACS. Not on anticoagulation PTA.  Heparin level therapeutic today at 0.50, CBC low but stable with plts improving, no bleeding noted.  Goal of Therapy:  Heparin level 0.3-0.7 units/ml Monitor platelets by anticoagulation protocol: Yes   Plan:  Continue heparin gtt at 700 units/hr F/u cards plan to d/c at 72h Monitor daily heparin level, CBC, s/s bleeding  Bertis Ruddy, PharmD Pharmacy Resident Pager #: [phone] 06/26/2017 9:06 AM

## 2017-06-26 NOTE — Progress Notes (Signed)
Clinical Social Worker facilitated patient discharge including contacting patient family and facility to confirm patient discharge plans.  Clinical information faxed to facility and family agreeable with plan.  Olivia Mackie from [facility] stated she will arrange transportation for patient to go to [facility] .  RN to call [phone] for report prior to discharge.  Clinical Social Worker will sign off for now as social work intervention is no longer needed. Please consult [place] again if new need arises.  Rhea Pink, MSW, [place]

## 2017-06-26 NOTE — Discharge Summary (Signed)
[name], is a 84 y.o. female  DOB 1932-10-25  MRN [id number].  Admission date:  06/23/2017  Admitting Physician  Ivor Costa, MD  Discharge Date:  06/26/2017   Primary MD  [name], MD  Recommendations for primary care physician for things to follow:  - patient discharged to become place, further management per [facility] place   Admission Diagnosis  AMS cp sob   Discharge Diagnosis  AMS cp sob    Principal Problem:   NSTEMI (non-ST elevated myocardial infarction) [facility]) Active Problems:   Breast cancer of lower-outer quadrant of right female breast ([place])   ESRD (end stage renal disease) on dialysis ([place])   Hypertension   Dyslipidemia   Pressure ulcer   Protein-calorie malnutrition, severe   S/P AKA (above knee amputation) bilateral ([place])   Hypokalemia      Past Medical History:  Diagnosis Date  . Anemia   . Arthritis   . Bilateral breast cancer ([place])   . DM (diabetes mellitus) ([place])   . Dyslipidemia   . ESRD (end stage renal disease) on dialysis ([place])    a. [place]: MWF (07/10/2014)  . Hepatitis C   . Hyperparathyroidism   . Hypertension   . PVD (peripheral vascular disease) ([place])    a. s/p peripheral angiogram on 07/10/14 with no PCI and cont med Rx  . Vascular disease     Past Surgical History:  Procedure Laterality Date  . ABDOMINAL HYSTERECTOMY    . AMPUTATION Bilateral 11/21/2015   Procedure: AMPUTATION ABOVE KNEE;  Surgeon: Newt Minion, MD;  Location: [place];  Service: Orthopedics;  Laterality: Bilateral;  . APPENDECTOMY    . ARTERIOVENOUS GRAFT PLACEMENT Left 03/20/09   "had right arm previously but couldn't use it anymore"  . BACK SURGERY    . BREAST BIOPSY Bilateral   . BREAST BIOPSY Right 08/08/2013   Procedure: Evacuation Hematoma Right Chest;  Surgeon: [name], MD;  Location: [place];  Service: General;  Laterality: Right;  . CATARACT EXTRACTION  Right   . DG AV DIALYSIS GRAFT DECLOT OR Left 01/27/11, 02/15/11, 03/15/11, 10/13/11   lua  . EVACUATION BREAST HEMATOMA  2001; 2015   left; right  . INSERTION OF DIALYSIS CATHETER Right 11/15/2015   Procedure: INSERTION OF Right Femoral  DIALYSIS CATHETER.;  Surgeon: Rosetta Posner, MD;  [facility] OR;  Service: Vascular;  Laterality: Right;  . IR PTA ADDL CENTRAL DIALYSIS SEG THRU DIALY CIRCUIT LEFT Left 06/23/2017  . IR THROMBECTOMY AV FISTULA W/THROMBOLYSIS INC/SHUNT/IMG LEFT Left 06/23/2017  . IR US GUIDE VASC ACCESS LEFT  06/23/2017  . MASTECTOMY Left 1970's?  Marland Kitchen MASTECTOMY COMPLETE / SIMPLE Right 08/07/2013  . MEDIAN NERVE REPAIR Left    "decompression"  . PERIPHERAL VASCULAR CATHETERIZATION N/A 07/10/2014   Procedure: Abdominal Aortogram;  Surgeon: Wellington Hampshire, MD;  Location: [place] INVASIVE CV LAB CUPID;  Service: Cardiovascular;  Laterality: N/A;  . POSTERIOR FUSION CERVICAL SPINE     "have 6 screws"  . THROMBECTOMY  AND REVISION OF ARTERIOVENTOUS (AV) GORETEX  GRAFT Left 04/18/2009  . THROMBECTOMY AND REVISION OF ARTERIOVENTOUS (AV) GORETEX  GRAFT Left 11/15/2015   Procedure: Thrombectomy and Revision of Left ARm AV Gortex Graft.;  Surgeon: Rosetta Posner, MD;  Location: [place];  Service: Vascular;  Laterality: Left;  . TOE AMPUTATION Right    1,2nd toes  . TONSILLECTOMY    . TOTAL MASTECTOMY Right 08/07/2013   Procedure: RIGHT TOTAL MASTECTOMY;  Surgeon: [name], MD;  Location: [place];  Service: General;  Laterality: Right;  . UTERINE FIBROID SURGERY         History of present illness and  Hospital Course:     Kindly see H&P for history of present illness and admission details, please review complete Labs, Consult reports and Test reports for all details in brief  HPI  from the history and physical done on the day of admission  HPI: [name] is a 84 y.o. female with medical history significant of bilateral s/p of AKA, hypertension, hyperlipidemia, diet-controlled diabetes,  PVD, ESRD-HD, anemia, HCV, bilateral breast cancer,who presents with chest pain.  Pt underwent thrombectomy of left upper extremity dialysis graft by IR. During the procedure, she developed SOB and CP. The chest pains located in the left side of the chest, moderate, pressure-like, nonradiating. No cough, fever or chills. She states that her CP and dyspnea resolved at the completion of the procedure. Currently she has some mild shortness of breath, but no chest pain. Patient has nausea and vomited once. Currently she does not have nausea, vomiting, diarrhea or abdominal pain. She has sacral ulcer and complains buttock pain. Pt had CBG 58 in ED-->67 on BMP.  ED Course: pt was found to have troponin 2.20, potassium is 3.4,creatinine 3.20, BUN 27, pending CBC, temperature normal, no tachycardia, O2 99% on room air, chest x-ray showed cardiomegaly. Patient is admitted to stepdown as inpatient. Cardiology will be consulted by EDP.      Hospital Course   [name] is a 84 y.o.femalewith ESRD on HD , PAD with b/l AKA,  HTN, Hep C,  breast Ca who is a very erratic historian and so most of the history is obtained from the chart.   She tells me she has not had dialysis since Monday due to issues with her access. Per chart, she presented for declotting of her left arm AVG yesterday by SCAT and was noted to be drowsy. CBG was 53 (she is not on any medication for her diabetes). She was given treatment for her sugar and declotted. She subsequently had dyspnea and maybe chest pain and was sent to the ED. Admitted to [facility] Troponin 2.20. Cardiology saw her in the evening. Started on Heparin infusion with plans for cath the next day.   Patient not candidate for interventional management of non-STEMI, and tinea to have progressive functional and mental decline, she has been transitioned to hospice.   non-ST elevated myocardial infarction - cardiology input greatly appreciated, troponins  peaked at30.2, baseline EKG with left bundle branch block, a 2-D echo showing EF 55-60%, patient is not a candidate for intervention, plan for medical therapy, continue with aspirin, beta blockers and Lipitor,she was treated with heparin GTT,she is currently hospice.    ESRD/  Hypertension/ acute hypoxic resp failure due to fluid overload - Management per renal, patient is extremely frail, hypotensive with dialysis, no significant amount of fluids could be removed with hemodialysis, she is a very poor candidate for  dialysis, he is a very poor candidate/high risk for dialysis , palliative care has been consulted.   Hypoglycemia - last A1c in computer was 5.2 in 2017  -Patient withvery low CBGs/glucose level during these episodes, but she is asymptomatic awake alert and talkative duhese episodes,    Breast cancer of lower-outer quadrant of right female breast  - anastrozole   Dyslipidemia    Severe pitting edema -  Nepro, Prostat   S/P AKA (above knee amputation) bilateral    Sacral decubitus ulcer  Goals of care: - Patient with very poor functional status at baseline, extremely frail, overall has very poor prognosis, is high risk for any further hemodialysis,a shunt and POA met and discussed with palliative care, she is transitioned to comfort care, and to discharge to become placed today.      Discharge Condition:  - comfort care       Discharge Instructions  and  Discharge Medications     Allergies as of 06/26/2017      Reactions   Aspirin Other (See Comments)   NO BLOOD THINNERS OF ANY KIND-bleeding events      Medication List    STOP taking these medications   anastrozole 1 MG tablet Commonly known as:  ARIMIDEX   calcitRIOL 0.25 MCG capsule Commonly known as:  ROCALTROL   metoprolol succinate 25 MG 24 hr tablet Commonly known as:  TOPROL XL     TAKE these medications   acetaminophen 325 MG tablet Commonly known as:  TYLENOL Take 2 tablets  (650 mg total) by mouth every 4 (four) hours as needed for headache or mild pain.   ALPRAZolam 0.25 MG tablet Commonly known as:  XANAX Take 1 tablet (0.25 mg total) by mouth 2 (two) times daily as needed for anxiety.   aspirin 81 MG chewable tablet Chew 1 tablet (81 mg total) by mouth daily.   cinacalcet 30 MG tablet Commonly known as:  SENSIPAR Take 2 tablets (60 mg total) by mouth every Monday, Wednesday, and Friday with hemodialysis. What changed:    how much to take  when to take this   collagenase ointment Commonly known as:  SANTYL Apply topically daily. What changed:  how much to take   DIALYVITE TABLET Tabs Take 1 tablet by mouth daily after supper.   ENSURE Take 237 mLs by mouth 2 (two) times daily between meals.   feeding supplement (NEPRO CARB STEADY) Liqd Take 237 mLs by mouth 2 (two) times daily between meals.   feeding supplement (PRO-STAT SUGAR FREE 64) Liqd Take 30 mLs by mouth 2 (two) times daily.   lanthanum 1000 MG chewable tablet Commonly known as:  FOSRENOL Chew 1,000 mg by mouth daily after supper.   lidocaine 5 % Commonly known as:  LIDODERM Place 1 patch onto the skin daily. Remove & Discard patch within 12 hours or as directed by MD   oxyCODONE 5 MG immediate release tablet Commonly known as:  Oxy IR/ROXICODONE Take 1 tablet (5 mg total) by mouth every 4 (four) hours as needed for moderate pain.   oxyCODONE-acetaminophen 5-325 MG tablet Commonly known as:  PERCOCET Take 1 tablet by mouth every 6 (six) hours as needed for severe pain.   zolpidem 5 MG tablet Commonly known as:  AMBIEN Take 1 tablet (5 mg total) by mouth at bedtime as needed for sleep.         Diet and Activity recommendation: See Discharge Instructions above   Major procedures and Radiology Reports -  PLEASE review detailed and final reports for all details, in brief -    Dg Chest 2 View  Result Date: 06/23/2017 CLINICAL DATA:  Chest pain and shortness of  breath which developed during interventional declotting procedure. EXAM: CHEST - 2 VIEW COMPARISON:  Chest radiograph 11/15/2015. FINDINGS: The heart is enlarged. Perihilar and lower lobe opacities are new from priors, along with small BILATERAL effusions. Etiology is uncertain. Pneumonia, pulmonary edema, or atelectasis are considerations. No osseous findings. Vascular stents are seen in the LEFT arm and LEFT central veins. LEFT axillary clips. IMPRESSION: Cardiomegaly with BILATERAL perihilar and lower lobe opacities, uncertain significance. No immediate preprocedure chest is available for correlation, but the findings have worsened since the prior chest radiograph of 2017. Consider CTA chest for further evaluation. Electronically Signed   By: Staci Righter M.D.   On: 06/23/2017 19:07   Dg Cervical Spine Complete  Result Date: 05/30/2017 CLINICAL DATA:  85 y/o F; fall 1 month ago with pain on the right side. Stiffness on the left side. EXAM: CERVICAL SPINE - COMPLETE 4+ VIEW COMPARISON:  None. FINDINGS: Oblique and frontal radiographs of the cervical spine. Left subclavian vascular stent noted. Anterior fusion hardware of upper cervical spine appears intact. No acute fracture or malalignment identified given limited views. Dense calcific atherosclerosis and carotid bifurcations. IMPRESSION: No acute fracture or malalignment identified on limited views. Anterior cervical fusion hardware is intact. If clinically indicated consider CT of the cervical spine for better characterization. Electronically Signed   By: Kristine Garbe M.D.   On: 05/30/2017 17:16   Ct Thoracic Spine Wo Contrast  Result Date: 05/30/2017 CLINICAL DATA:  84 y/o F; mechanical fall 1 month ago with persistent shoulder pain, lower back pain, buttocks pain. EXAM: CT THORACIC SPINE WITHOUT CONTRAST TECHNIQUE: Multidetector CT images of the thoracic were obtained using the standard protocol without intravenous contrast. COMPARISON:   None. FINDINGS: Alignment: Minimal dextrocurvature.  Normal kyphosis. Vertebrae: No acute fracture or focal pathologic process. Paraspinal and other soft tissues: Atrophic kidneys with multiple small cysts. Large bilateral pleural effusions. Interstitial pulmonary edema. Patchy opacities in the left upper lobe. Aortic atherosclerosis and severe coronary artery calcification. Disc levels: Multilevel discogenic degenerative changes with mild loss of intervertebral disc space height, disc calcifications, endplate marginal osteophytes. No high-grade bony canal or foraminal stenosis. IMPRESSION: 1. No acute fracture or dislocation of thoracic spine. 2. Mild thoracic spine spondylosis. 3. Large pleural effusions and interstitial pulmonary edema. Left upper lobe patchy opacities may represent alveolar edema or superimposed pneumonia. 4. Severe coronary artery calcification.  Aortic atherosclerosis. Electronically Signed   By: Kristine Garbe M.D.   On: 05/30/2017 17:08   Ct Lumbar Spine Wo Contrast  Result Date: 05/30/2017 CLINICAL DATA:  84 y/o F; fall 1 month ago with lower back and buttocks pain. EXAM: CT LUMBAR SPINE WITHOUT CONTRAST TECHNIQUE: Multidetector CT imaging of the lumbar spine was performed without intravenous contrast administration. Multiplanar CT image reconstructions were also generated. COMPARISON:  None. FINDINGS: Segmentation: 5 lumbar type vertebrae. Alignment: Grade 1 L4-5 anterolisthesis. Vertebrae: Bones are demineralized. No acute fracture or dislocation. Right S1 segment well-circumscribed sclerotic focus, likely bone island. Paraspinal and other soft tissues: Renal atrophy with multiple cysts. Severe calcific atherosclerosis of the aorta and its branch vessels. Dilatation of the distal sigmoid and rectum. Disc levels: Multilevel disc and facet degenerative changes greatest at the L4-5 level at L5-S1 level where disc bulges, endplate marginal osteophytes, and facet hypertrophy  result in mild-to-moderate foraminal stenosis and  there is moderate to severe L4-5 canal stenosis. IMPRESSION: 1. No acute fracture identified. 2. Grade 1 L4-5 anterolisthesis. 3. Lumbar spondylosis greatest at L4-5 level where there is mild-to-moderate foraminal stenosis and moderate to severe canal stenosis. 4. Aortic atherosclerosis. 5. Distal sigmoid colon and rectum distention may represent constipation. Electronically Signed   By: Kristine Garbe M.D.   On: 05/30/2017 17:13   Ir US Guide Vasc Access Left  Result Date: 06/23/2017 INDICATION: 84 year old female with end-stage renal disease on hemodialysis via a left upper extremity arteriovenous graft. Her access has clotted, she has not dialyzed since Monday. EXAM: 1. Ultrasound-guided antegrade access 2. Ultrasound-guided retrograde access 3. Central venogram 4. Thrombolysis/thrombectomy declot procedure using tPA, the Angiojet and the Arrow percutaneous thrombectomy device 5. Angioplasty of in stent stenosis within the left innominate vein stent system. MEDICATIONS: 5000 units heparin ANESTHESIA/SEDATION: No sedation. FLUOROSCOPY TIME:  Fluoroscopy Time: 11 minutes 42 seconds (45 mGy). COMPLICATIONS: None immediate. Patient did experience some chest pain and shortness of breath during the procedure as well as complaining of buttock pain, possibly due to a sacral decubitus ulcer. She also notes that she has no one at home to stay with her. For these reasons, after the procedure we transferred her to the emergency room for further evaluation prior to discharge. PROCEDURE: Informed written consent was obtained from the patient after a thorough discussion of the procedural risks, benefits and alternatives. All questions were addressed. Maximal Sterile Barrier Technique was utilized including caps, mask, sterile gowns, sterile gloves, sterile drape, hand hygiene and skin antiseptic. A timeout was performed prior to the initiation of the procedure. The  left upper arm graft was interrogated with ultrasound and found to be completely thrombosed. An image was obtained and stored for the medical record. Local anesthesia was attained by infiltration with 1% lidocaine. A small dermatotomy was made. Under real-time sonographic guidance, the graft was punctured with a 21 gauge micropuncture needle in antegrade fashion adjacent to the arterial anastomosis. Using standard technique, the initial micro needle was exchanged over a 0.018 micro wire for a transitional 4 [place] micro sheath. 2 mg of tPA was then instilled into the thrombus. A second retrograde access was identified farther up the arm. Local anesthesia was attained by infiltration with 1% lidocaine. A small dermatotomy was made. Under real-time sonographic guidance, the vessel was punctured with a 21 gauge micropuncture needle. Using standard technique, the initial micro needle was exchanged over a 0.018 micro wire for a transitional 4 [place] micro sheath. Another 2 mg of tPA was instilled into the thrombus from the retrograde access. Attention was again turned to the antegrade access adjacent to the arterial anastomosis. A short Amplatz wire was advanced through the transitional micro sheath in the micro sheath was exchanged for a working 7 [place] vascular sheath. An angled catheter was then advanced over the wire and into the left subclavian vein. A central venogram was performed. There is a a combination of likely in stent stenosis and thrombus within the left innominate and subclavian venous stent system resulting in severe limitation of flow. Nonocclusive thrombus extends from the peripheral margin of the stent system throughout the left subclavian and axillary veins. Pull-back left upper extremity venogram confirms the extent of the thrombus as well as thrombus within the graft. Due to the volume of thrombus, the decision was made to proceed with debulking of the thrombus using the Angiojet. The catheter was  navigated through the nearly occluded central stent system using a Bentson wire.  The Bentson wire was then exchanged for a Rosen wire which was placed in the superior vena cava. A 6 French 90 cm Angiojet was then advanced over the wire and the segments of vein from the innominate stent to the stent in the mid arm was thrombectomized. Multiple passes of the Angiojet was performed. Follow-up venography demonstrates marked reduction in clot burden. There is only some mild residual InStent stenosis within the stent system. No definite clot identified. The catheter was then removed. The percutaneous thrombectomy device was then advanced into the graft and the graft was thrombectomized using the device. Attention was then turned to the retrograde graft access. The micro sheath was exchanged over a short Amplatz wire for a working 6 [place] vascular sheath. The percutaneous thrombectomy device was then advanced into the brachial artery and used in basket fashion to pull the arterial plug back into the body of the graft where it was macerated. Aspiration was then performed. Both the antegrade and retrograde sheath demonstrate brisk bleeding consistent with restoration of flow. The angled catheter was then advanced over a Bentson wire into the brachial artery and a brachial arteriogram was performed. This confirms patency of the arterial anastomosis, the graft and the draining vein. There is persistent in stent stenosis versus peripheral adherent thrombus within the left subclavian stent system. Angioplasty was performed using a 10 x 40 mm Conquest balloon. Following angioplasty, there is some reduction of flow secondary to residual thrombus. The balloon was removed. The Angiojet device was reinserted. Additional thrombectomy was performed through the Angiojet within the stent system. Final arteriography demonstrates brisk flow with minimal residual filling defect. There is a palpable thrill at the graft. IMPRESSION: 1.  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 2. Successful pharmacomechanical thrombolysis/thrombectomy procedure using a combination of tPA, Angiojet and the Arrow percutaneous thrombectomy device. 3. Successful angioplasty of in stent stenosis within the left innominate stent system using a 10 x 40 mm Conquest balloon. ACCESS: This access remains amenable to future percutaneous interventions as clinically indicated. Electronically Signed   By: Jacqulynn Cadet M.D.   On: 06/23/2017 17:06   Ir Thrombectomy Av Fistula W/thrombolysis Inc/shunt/img Left  Result Date: 06/23/2017 INDICATION: 84 year old female with end-stage renal disease on hemodialysis via a left upper extremity arteriovenous graft. Her access has clotted, she has not dialyzed since Monday. EXAM: 1. Ultrasound-guided antegrade access 2. Ultrasound-guided retrograde access 3. Central venogram 4. Thrombolysis/thrombectomy declot procedure using tPA, the Angiojet and the Arrow percutaneous thrombectomy device 5. Angioplasty of in stent stenosis within the left innominate vein stent system. MEDICATIONS: 5000 units heparin ANESTHESIA/SEDATION: No sedation. FLUOROSCOPY TIME:  Fluoroscopy Time: 11 minutes 42 seconds (45 mGy). COMPLICATIONS: None immediate. Patient did experience some chest pain and shortness of breath during the procedure as well as complaining of buttock pain, possibly due to a sacral decubitus ulcer. She also notes that she has no one at home to stay with her. For these reasons, after the procedure we transferred her to the emergency room for further evaluation prior to discharge. PROCEDURE: Informed written consent was obtained from the patient after a thorough discussion of the procedural risks, benefits and alternatives. All questions were addressed. Maximal Sterile Barrier Technique was utilized including caps, mask, sterile gowns, sterile gloves, sterile drape,  hand hygiene and skin antiseptic. A timeout was performed prior to the initiation of the procedure. The left upper arm graft was interrogated with ultrasound and found to be completely thrombosed.  An image was obtained and stored for the medical record. Local anesthesia was attained by infiltration with 1% lidocaine. A small dermatotomy was made. Under real-time sonographic guidance, the graft was punctured with a 21 gauge micropuncture needle in antegrade fashion adjacent to the arterial anastomosis. Using standard technique, the initial micro needle was exchanged over a 0.018 micro wire for a transitional 4 [place] micro sheath. 2 mg of tPA was then instilled into the thrombus. A second retrograde access was identified farther up the arm. Local anesthesia was attained by infiltration with 1% lidocaine. A small dermatotomy was made. Under real-time sonographic guidance, the vessel was punctured with a 21 gauge micropuncture needle. Using standard technique, the initial micro needle was exchanged over a 0.018 micro wire for a transitional 4 [place] micro sheath. Another 2 mg of tPA was instilled into the thrombus from the retrograde access. Attention was again turned to the antegrade access adjacent to the arterial anastomosis. A short Amplatz wire was advanced through the transitional micro sheath in the micro sheath was exchanged for a working 7 [place] vascular sheath. An angled catheter was then advanced over the wire and into the left subclavian vein. A central venogram was performed. There is a a combination of likely in stent stenosis and thrombus within the left innominate and subclavian venous stent system resulting in severe limitation of flow. Nonocclusive thrombus extends from the peripheral margin of the stent system throughout the left subclavian and axillary veins. Pull-back left upper extremity venogram confirms the extent of the thrombus as well as thrombus within the graft. Due to the volume of  thrombus, the decision was made to proceed with debulking of the thrombus using the Angiojet. The catheter was navigated through the nearly occluded central stent system using a Bentson wire. The Bentson wire was then exchanged for a Rosen wire which was placed in the superior vena cava. A 6 French 90 cm Angiojet was then advanced over the wire and the segments of vein from the innominate stent to the stent in the mid arm was thrombectomized. Multiple passes of the Angiojet was performed. Follow-up venography demonstrates marked reduction in clot burden. There is only some mild residual InStent stenosis within the stent system. No definite clot identified. The catheter was then removed. The percutaneous thrombectomy device was then advanced into the graft and the graft was thrombectomized using the device. Attention was then turned to the retrograde graft access. The micro sheath was exchanged over a short Amplatz wire for a working 6 [place] vascular sheath. The percutaneous thrombectomy device was then advanced into the brachial artery and used in basket fashion to pull the arterial plug back into the body of the graft where it was macerated. Aspiration was then performed. Both the antegrade and retrograde sheath demonstrate brisk bleeding consistent with restoration of flow. The angled catheter was then advanced over a Bentson wire into the brachial artery and a brachial arteriogram was performed. This confirms patency of the arterial anastomosis, the graft and the draining vein. There is persistent in stent stenosis versus peripheral adherent thrombus within the left subclavian stent system. Angioplasty was performed using a 10 x 40 mm Conquest balloon. Following angioplasty, there is some reduction of flow secondary to residual thrombus. The balloon was removed. The Angiojet device was reinserted. Additional thrombectomy was performed through the Angiojet within the stent system. Final arteriography demonstrates  brisk flow with minimal residual filling defect. There is a palpable thrill at the graft. IMPRESSION: 1. Relatively large central  thrombus burden beginning within the left innominate and subclavian venous stent system. 2. Successful pharmacomechanical thrombolysis/thrombectomy procedure using a combination of tPA, Angiojet and the Arrow percutaneous thrombectomy device. 3. Successful angioplasty of in stent stenosis within the left innominate stent system using a 10 x 40 mm Conquest balloon. ACCESS: This access remains amenable to future percutaneous interventions as clinically indicated. Electronically Signed   By: Jacqulynn Cadet M.D.   On: 06/23/2017 17:06   Ir Pta Addl Central Dialysis Seg Thru Dialy Circuit Left  Result Date: 06/23/2017 INDICATION: 84 year old female with end-stage renal disease on hemodialysis via a left upper extremity arteriovenous graft. Her access has clotted, she has not dialyzed since Monday. EXAM: 1. Ultrasound-guided antegrade access 2. Ultrasound-guided retrograde access 3. Central venogram 4. Thrombolysis/thrombectomy declot procedure using tPA, the Angiojet and the Arrow percutaneous thrombectomy device 5. Angioplasty of in stent stenosis within the left innominate vein stent system. MEDICATIONS: 5000 units heparin ANESTHESIA/SEDATION: No sedation. FLUOROSCOPY TIME:  Fluoroscopy Time: 11 minutes 42 seconds (45 mGy). COMPLICATIONS: None immediate. Patient did experience some chest pain and shortness of breath during the procedure as well as complaining of buttock pain, possibly due to a sacral decubitus ulcer. She also notes that she has no one at home to stay with her. For these reasons, after the procedure we transferred her to the emergency room for further evaluation prior to discharge. PROCEDURE: Informed written consent was obtained from the patient after a thorough discussion of the procedural risks, benefits and alternatives. All questions were addressed. Maximal  Sterile Barrier Technique was utilized including caps, mask, sterile gowns, sterile gloves, sterile drape, hand hygiene and skin antiseptic. A timeout was performed prior to the initiation of the procedure. The left upper arm graft was interrogated with ultrasound and found to be completely thrombosed. An image was obtained and stored for the medical record. Local anesthesia was attained by infiltration with 1% lidocaine. A small dermatotomy was made. Under real-time sonographic guidance, the graft was punctured with a 21 gauge micropuncture needle in antegrade fashion adjacent to the arterial anastomosis. Using standard technique, the initial micro needle was exchanged over a 0.018 micro wire for a transitional 4 [place] micro sheath. 2 mg of tPA was then instilled into the thrombus. A second retrograde access was identified farther up the arm. Local anesthesia was attained by infiltration with 1% lidocaine. A small dermatotomy was made. Under real-time sonographic guidance, the vessel was punctured with a 21 gauge micropuncture needle. Using standard technique, the initial micro needle was exchanged over a 0.018 micro wire for a transitional 4 [place] micro sheath. Another 2 mg of tPA was instilled into the thrombus from the retrograde access. Attention was again turned to the antegrade access adjacent to the arterial anastomosis. A short Amplatz wire was advanced through the transitional micro sheath in the micro sheath was exchanged for a working 7 [place] vascular sheath. An angled catheter was then advanced over the wire and into the left subclavian vein. A central venogram was performed. There is a a combination of likely in stent stenosis and thrombus within the left innominate and subclavian venous stent system resulting in severe limitation of flow. Nonocclusive thrombus extends from the peripheral margin of the stent system throughout the left subclavian and axillary veins. Pull-back left upper extremity  venogram confirms the extent of the thrombus as well as thrombus within the graft. Due to the volume of thrombus, the decision was made to proceed with debulking of the thrombus using the Angiojet.  The catheter was navigated through the nearly occluded central stent system using a Bentson wire. The Bentson wire was then exchanged for a Rosen wire which was placed in the superior vena cava. A 6 French 90 cm Angiojet was then advanced over the wire and the segments of vein from the innominate stent to the stent in the mid arm was thrombectomized. Multiple passes of the Angiojet was performed. Follow-up venography demonstrates marked reduction in clot burden. There is only some mild residual InStent stenosis within the stent system. No definite clot identified. The catheter was then removed. The percutaneous thrombectomy device was then advanced into the graft and the graft was thrombectomized using the device. Attention was then turned to the retrograde graft access. The micro sheath was exchanged over a short Amplatz wire for a working 6 [place] vascular sheath. The percutaneous thrombectomy device was then advanced into the brachial artery and used in basket fashion to pull the arterial plug back into the body of the graft where it was macerated. Aspiration was then performed. Both the antegrade and retrograde sheath demonstrate brisk bleeding consistent with restoration of flow. The angled catheter was then advanced over a Bentson wire into the brachial artery and a brachial arteriogram was performed. This confirms patency of the arterial anastomosis, the graft and the draining vein. There is persistent in stent stenosis versus peripheral adherent thrombus within the left subclavian stent system. Angioplasty was performed using a 10 x 40 mm Conquest balloon. Following angioplasty, there is some reduction of flow secondary to residual thrombus. The balloon was removed. The Angiojet device was reinserted. Additional  thrombectomy was performed through the Angiojet within the stent system. Final arteriography demonstrates brisk flow with minimal residual filling defect. There is a palpable thrill at the graft. IMPRESSION: 1. 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 2. Successful pharmacomechanical thrombolysis/thrombectomy procedure using a combination of tPA, Angiojet and the Arrow percutaneous thrombectomy device. 3. Successful angioplasty of in stent stenosis within the left innominate stent system using a 10 x 40 mm Conquest balloon. ACCESS: This access remains amenable to future percutaneous interventions as clinically indicated. Electronically Signed   By: Jacqulynn Cadet M.D.   On: 06/23/2017 17:06   Dg Hips Bilat W Or Wo Pelvis 3-4 Views  Result Date: 05/30/2017 CLINICAL DATA:  [place] a month ago, RIGHT-side pain, LEFT side stiffness EXAM: DG HIP (WITH OR WITHOUT PELVIS) 3-4V BILAT COMPARISON:  Abdominal radiograph 11/15/2015 FINDINGS: Severe osseous demineralization. Narrowing of the hip joints bilaterally. Foreshortening of the LEFT femoral neck suspicious for an impacted subcapital fracture. No additional fracture, dislocation or bone destruction. Severe diffuse atherosclerotic calcification of large and small vessels in pelvis. IMPRESSION: Severe osseous demineralization with suspected impacted subcapital fracture of the LEFT femoral neck. Electronically Signed   By: Lavonia Dana M.D.   On: 05/30/2017 17:15    Micro Results     Recent Results (from the past 240 hour(s))  MRSA PCR Screening     Status: None   Collection Time: 06/24/17 12:46 AM  Result Value Ref Range Status   MRSA by PCR NEGATIVE NEGATIVE Final    Comment:        The GeneXpert MRSA Assay (FDA approved for NASAL specimens only), is one component of a comprehensive MRSA colonization surveillance program. It is not intended to diagnose MRSA infection nor to  guide or monitor treatment for MRSA infections. Performed at [facility], [place] [address],  [place], [place] [address]        Today   Subjective:   No significant events overnight  Objective:   Blood pressure (!) 93/34, pulse 64, temperature (!) 97.4 F (36.3 C), temperature source Oral, resp. rate 15, height _0  (1.6 m), weight 23.1 kg (51 lb), SpO2 100 %.   Intake/Output Summary (Last 24 hours) at 06/26/2017 0938 Last data filed at 06/26/2017 0728 Gross per 24 hour  Intake 592.77 ml  Output 0 ml  Net 592.77 ml    Exam Lethargic, arousable, extremely frail and appearing. Symmetrical Chest wall movement, Good air movement bilaterally, CTAB RRR,No Gallops,Rubs or new Murmurs, No Parasternal Heave +ve B.Sounds, Abd Soft, Non tender, No organomegaly appriciated, No rebound -guarding or rigidity. No Cyanosis, Clubbing or edema, No new Rash or bruise  Data Review   CBC w Diff:  Lab Results  Component Value Date   WBC 4.5 06/26/2017   HGB 9.6 (L) 06/26/2017   HCT 29.3 (L) 06/26/2017   PLT 111 (L) 06/26/2017   LYMPHOPCT 16 06/24/2017   MONOPCT 4 06/24/2017   EOSPCT 0 06/24/2017   BASOPCT 0 06/24/2017    CMP:  Lab Results  Component Value Date   NA 137 06/25/2017   NA 139 09/04/2013   K 4.2 06/25/2017   K 4.0 09/04/2013   CL 100 (L) 06/25/2017   CO2 22 06/25/2017   CO2 31 (H) 09/04/2013   BUN 20 06/25/2017   BUN 49.9 (H) 09/04/2013   CREATININE 2.44 (H) 06/25/2017   CREATININE 6.2 (HH) 09/04/2013   PROT 6.1 (L) 08/21/2016   PROT 6.3 (L) 09/04/2013   ALBUMIN 3.1 (L) 06/24/2017   ALBUMIN 2.7 (L) 09/04/2013   BILITOT 0.4 08/21/2016   BILITOT 0.54 09/04/2013   ALKPHOS 72 08/21/2016   ALKPHOS 152 (H) 09/04/2013   AST 28 08/21/2016   AST 43 (H) 09/04/2013   ALT 15 08/21/2016   ALT 21 09/04/2013  .   Total Time in preparing paper work, data evaluation and todays exam - 66 minutes  Phillips Climes M.D on 06/26/2017 at 9:38 AM  [facility]   Office  [phone]

## 2017-06-26 NOTE — Discharge Instructions (Signed)
Takes medications whole.  Needs assist with feeding.  Had a large BM at 10am today.

## 2017-06-27 LAB — GLUCOSE, CAPILLARY
GLUCOSE-CAPILLARY: 21 mg/dL — AB (ref 65–99)
Glucose-Capillary: <10 mg/dL — CL (ref 65–99)

## 2017-06-28 DIAGNOSIS — Z515 Encounter for palliative care: Secondary | ICD-10-CM

## 2017-07-06 DEATH — deceased

## 2017-11-24 ENCOUNTER — Ambulatory Visit: Payer: Medicare Other | Admitting: Oncology

## 2017-12-22 IMAGING — CR DG ABD PORTABLE 1V
1 series · 1 of 1 positions shown · non-contrast
Comparison: 03/24/2012

CLINICAL DATA: Change in bowel habits

EXAM:
PORTABLE ABDOMEN - 1 VIEW

[AP]
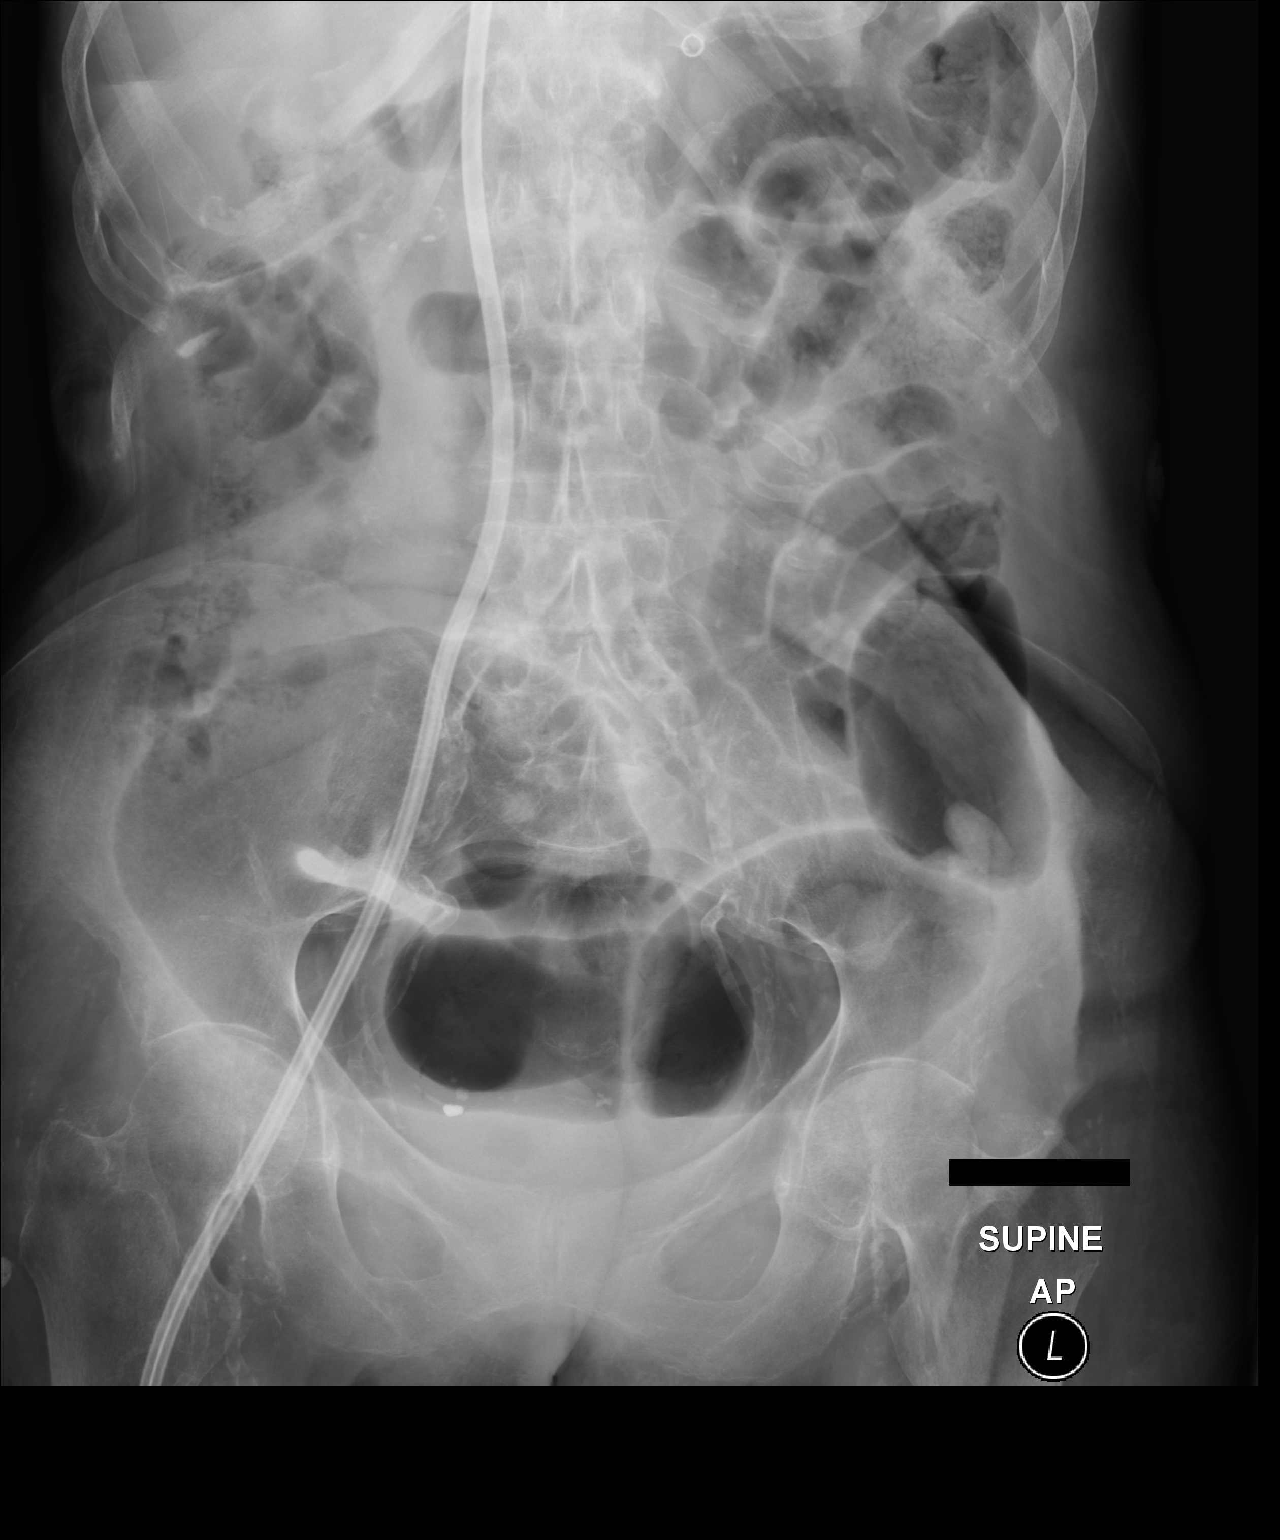

[1 of 1 positions shown; findings below may reference images not displayed]

FINDINGS: There is moderate gaseous distension of the lower abdominal and
pelvic bowel loops. Average volume of stool noted within the colon.
No dilated loops of small bowel identified. Right groin dialysis
catheter is identified. The tip is within the cavoatrial junction.
IMPRESSION: 1. Nonobstructive bowel gas pattern.
2. The dialysis catheter tip is in the cavoatrial junction

## 2017-12-22 IMAGING — CR DG CHEST 1V PORT
1 series · 1 of 1 positions shown · non-contrast
Comparison: 12/27/2013

CLINICAL DATA: Status post dialysis catheter insertion through the
right groin.

EXAM:
PORTABLE CHEST 1 VIEW

[AP]
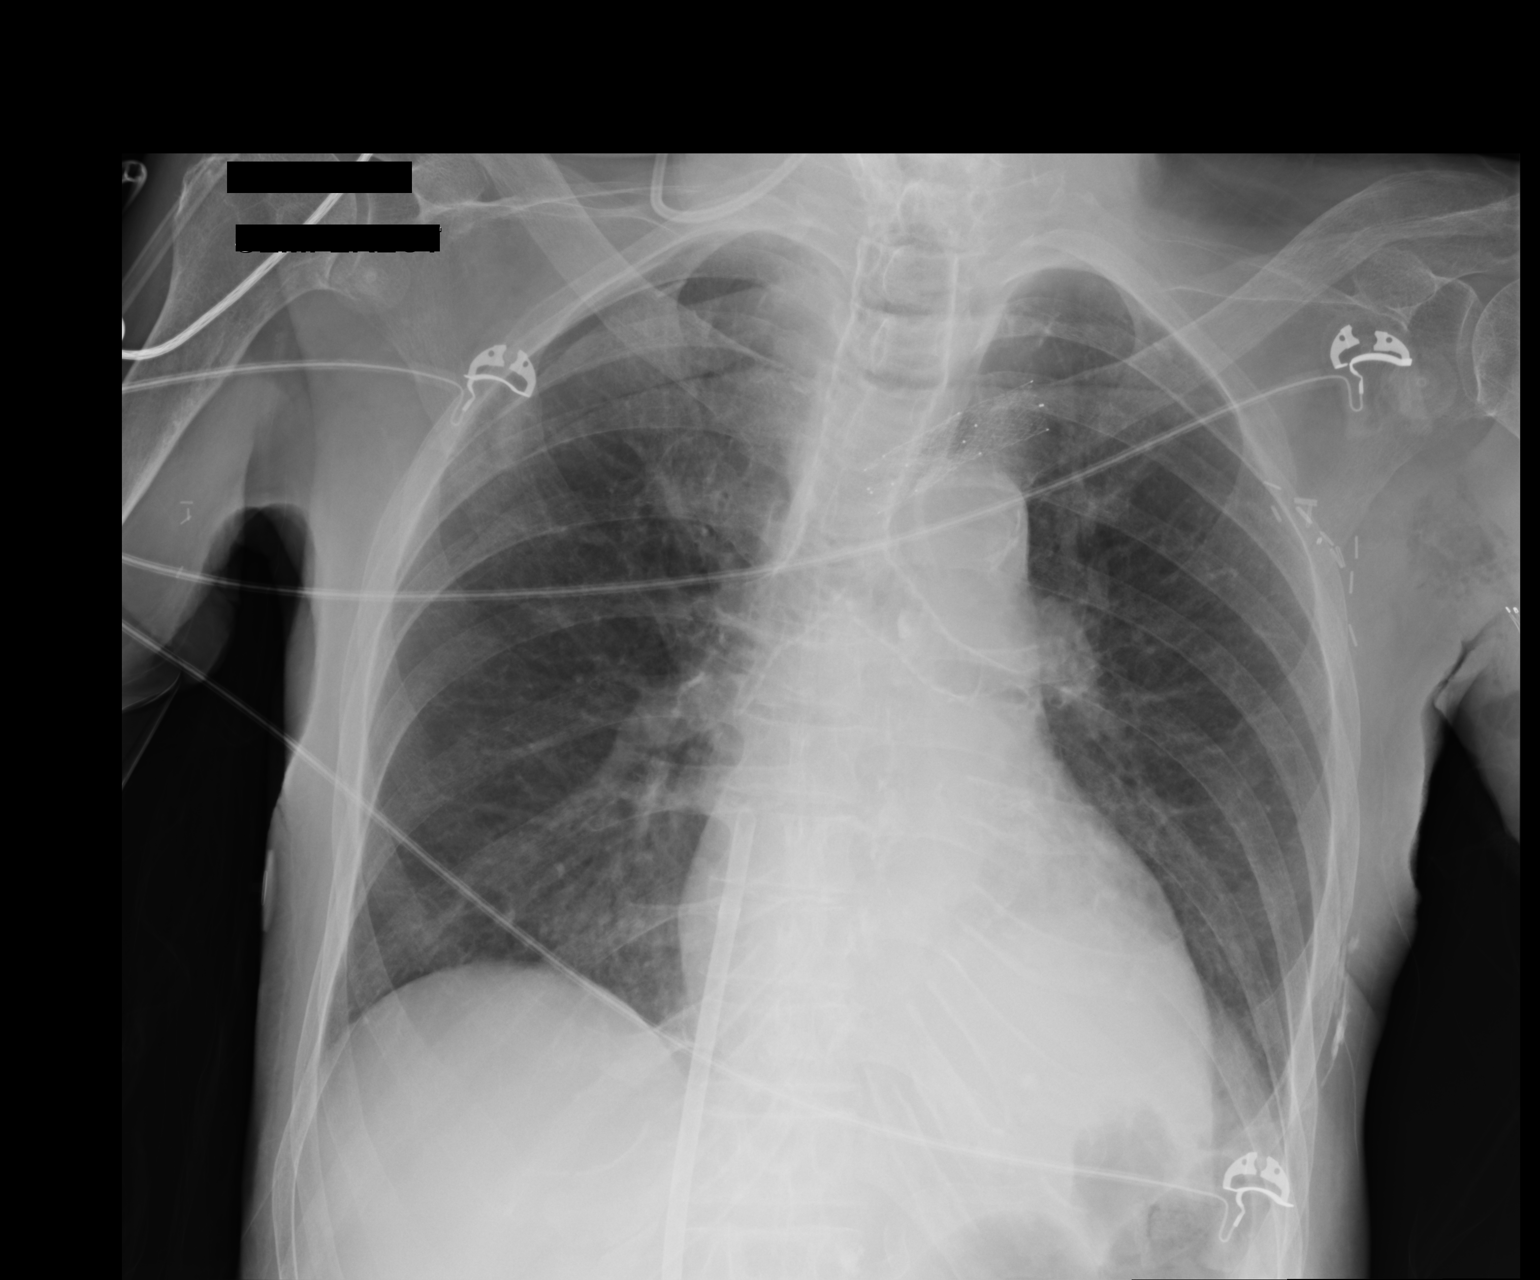

[1 of 1 positions shown; findings below may reference images not displayed]

FINDINGS: Large bore catheter is seen from inferior approach with tip
overlying the expected location of the proximal right atrium.
Vascular stent in the region of the left brachiocephalic vein is
stable.

Cardiomediastinal silhouette is normal. Mediastinal contours appear
intact.

There is no evidence of focal airspace consolidation, pleural
effusion or pneumothorax.

Osseous structures are without acute abnormality. Soft tissues are
grossly normal.
IMPRESSION: No active disease.

Large bore catheter from the inferior approach terminates in the
expected location of proximal right atrium.
